# Patient Record
Sex: Female | Born: 1965 | Race: White | Hispanic: No | Marital: Married | Smoking: Former smoker
Health system: Southern US, Community
[De-identification: ages and names within clinical notes are randomized; demographics above are authoritative.]

## PROBLEM LIST (undated history)

## (undated) DIAGNOSIS — H40053 Ocular hypertension, bilateral: Secondary | ICD-10-CM

## (undated) DIAGNOSIS — E041 Nontoxic single thyroid nodule: Secondary | ICD-10-CM

## (undated) DIAGNOSIS — T7840XA Allergy, unspecified, initial encounter: Secondary | ICD-10-CM

## (undated) DIAGNOSIS — Z8709 Personal history of other diseases of the respiratory system: Secondary | ICD-10-CM

## (undated) DIAGNOSIS — M069 Rheumatoid arthritis, unspecified: Secondary | ICD-10-CM

## (undated) DIAGNOSIS — Z8742 Personal history of other diseases of the female genital tract: Secondary | ICD-10-CM

## (undated) DIAGNOSIS — O24419 Gestational diabetes mellitus in pregnancy, unspecified control: Secondary | ICD-10-CM

## (undated) DIAGNOSIS — H409 Unspecified glaucoma: Secondary | ICD-10-CM

## (undated) DIAGNOSIS — Z9889 Other specified postprocedural states: Secondary | ICD-10-CM

## (undated) DIAGNOSIS — I10 Essential (primary) hypertension: Secondary | ICD-10-CM

## (undated) DIAGNOSIS — R519 Headache, unspecified: Secondary | ICD-10-CM

## (undated) DIAGNOSIS — R112 Nausea with vomiting, unspecified: Secondary | ICD-10-CM

## (undated) DIAGNOSIS — G2581 Restless legs syndrome: Secondary | ICD-10-CM

## (undated) DIAGNOSIS — M5030 Other cervical disc degeneration, unspecified cervical region: Secondary | ICD-10-CM

## (undated) DIAGNOSIS — K219 Gastro-esophageal reflux disease without esophagitis: Secondary | ICD-10-CM

## (undated) DIAGNOSIS — R51 Headache: Secondary | ICD-10-CM

## (undated) DIAGNOSIS — G4730 Sleep apnea, unspecified: Secondary | ICD-10-CM

## (undated) HISTORY — DX: Unspecified glaucoma: H40.9

## (undated) HISTORY — DX: Gastro-esophageal reflux disease without esophagitis: K21.9

## (undated) HISTORY — DX: Restless legs syndrome: G25.81

## (undated) HISTORY — DX: Gestational diabetes mellitus in pregnancy, unspecified control: O24.419

## (undated) HISTORY — DX: Personal history of other diseases of the female genital tract: Z87.42

## (undated) HISTORY — DX: Sleep apnea, unspecified: G47.30

## (undated) HISTORY — DX: Rheumatoid arthritis, unspecified: M06.9

## (undated) HISTORY — DX: Allergy, unspecified, initial encounter: T78.40XA

## (undated) HISTORY — PX: TUBAL LIGATION: SHX77

## (undated) HISTORY — PX: PARTIAL HYSTERECTOMY: SHX80

---

## 2005-05-16 ENCOUNTER — Emergency Department: Payer: Self-pay | Admitting: Emergency Medicine

## 2006-08-21 ENCOUNTER — Emergency Department: Payer: Self-pay | Admitting: Emergency Medicine

## 2009-04-09 ENCOUNTER — Emergency Department: Payer: Self-pay | Admitting: Emergency Medicine

## 2015-09-22 ENCOUNTER — Other Ambulatory Visit: Payer: Self-pay | Admitting: Physician Assistant

## 2015-09-22 ENCOUNTER — Telehealth: Payer: Self-pay | Admitting: Physician Assistant

## 2015-09-22 DIAGNOSIS — J309 Allergic rhinitis, unspecified: Secondary | ICD-10-CM

## 2015-09-22 MED ORDER — FLUTICASONE PROPIONATE 50 MCG/ACT NA SUSP: 2.0000 | Freq: Every day | NASAL | Status: DC

## 2015-09-22 NOTE — Telephone Encounter (Signed)
Med refill request

## 2015-10-08 ENCOUNTER — Encounter: Payer: Self-pay | Admitting: Physician Assistant

## 2015-10-08 ENCOUNTER — Ambulatory Visit: Payer: Self-pay | Admitting: Physician Assistant

## 2015-10-08 VITALS — BP 121/70 | HR 82 | Temp 98.5°F

## 2015-10-08 DIAGNOSIS — J069 Acute upper respiratory infection, unspecified: Secondary | ICD-10-CM

## 2015-10-08 MED ORDER — PREDNISONE 10 MG PO TABS
30.0000 mg | ORAL_TABLET | Freq: Every day | ORAL | Status: DC
Start: 2015-10-08 — End: 2016-01-22

## 2015-10-08 MED ORDER — CEFDINIR 300 MG PO CAPS: 300.0000 mg | ORAL_CAPSULE | Freq: Two times a day (BID) | ORAL | Status: DC

## 2015-10-08 MED ORDER — ALBUTEROL SULFATE HFA 108 (90 BASE) MCG/ACT IN AERS: 2.0000 | INHALATION_SPRAY | Freq: Four times a day (QID) | RESPIRATORY_TRACT | Status: DC | PRN

## 2015-10-08 MED ORDER — FLUCONAZOLE 150 MG PO TABS: ORAL_TABLET | ORAL | Status: DC

## 2015-10-08 NOTE — Progress Notes (Signed)
S: C/o runny nose and congestion for 5 days, no fever, chills, cp/sob, v/d; mucus was green this am but clear throughout the day, cough is sporadic, dry, keeping her awake, pressure in face  Using otc meds: robitussin  O: PE: vitals wnl, nad, perrl eomi, normocephalic, tms dull, nasal mucosa red and swollen, throat injected, neck supple no lymph, lungs c t a, cv rrr, neuro intact, cough is dry  A:  Acute  uri   P:omnicef 300mg  bid, albuterol inhaler, prednisone 30mg  qd x 3d, diflucan,  drink fluids, continue regular meds , use otc meds of choice, return if not improving in 5 days, return earlier if worsening

## 2015-11-28 ENCOUNTER — Encounter: Payer: Self-pay | Admitting: Family

## 2015-11-28 ENCOUNTER — Ambulatory Visit: Payer: Self-pay | Admitting: Family

## 2015-11-28 VITALS — BP 119/70 | HR 77 | Temp 98.4°F

## 2015-11-28 DIAGNOSIS — J019 Acute sinusitis, unspecified: Secondary | ICD-10-CM

## 2015-11-28 DIAGNOSIS — Z299 Encounter for prophylactic measures, unspecified: Secondary | ICD-10-CM

## 2015-11-28 MED ORDER — PREDNISONE 10 MG PO TABS: 10.0000 mg | ORAL_TABLET | Freq: Every day | ORAL | Status: DC

## 2015-11-28 MED ORDER — LEVOFLOXACIN 500 MG PO TABS: 500.0000 mg | ORAL_TABLET | Freq: Every day | ORAL | Status: DC

## 2015-11-28 MED ORDER — FLUCONAZOLE 150 MG PO TABS: 150.0000 mg | ORAL_TABLET | Freq: Once | ORAL | Status: DC

## 2015-11-28 NOTE — Progress Notes (Signed)
S/ facial pain and pressure , ears stopped up and dizzy when coughs , fatigue blowing green , not resting at nt due to cough, denies cp or sob O/ VSS  ENT tms dull, nasal mucosa red swollen, left septum  excoriated  , + frontal and max tenderness, throat injected neck supple heart rsr lungs clear A / Sinusitis  P / levaquin 500 mg #10 , diflucan for prn. Prednisone pulse x 3 days , nasal saline products ,

## 2016-01-22 ENCOUNTER — Encounter: Payer: Self-pay | Admitting: Physician Assistant

## 2016-01-22 ENCOUNTER — Ambulatory Visit: Payer: Self-pay | Admitting: Physician Assistant

## 2016-01-22 VITALS — BP 139/80 | HR 76 | Temp 98.5°F

## 2016-01-22 DIAGNOSIS — J069 Acute upper respiratory infection, unspecified: Secondary | ICD-10-CM

## 2016-01-22 LAB — POCT INFLUENZA A/B
Influenza A, POC: NEGATIVE
Influenza B, POC: NEGATIVE

## 2016-01-22 MED ORDER — METHYLPREDNISOLONE 4 MG PO TBPK: ORAL_TABLET | ORAL | Status: DC

## 2016-01-22 MED ORDER — FLUCONAZOLE 150 MG PO TABS
150.0000 mg | ORAL_TABLET | Freq: Once | ORAL | Status: DC
Start: 2016-01-22 — End: 2016-02-11

## 2016-01-22 MED ORDER — AZITHROMYCIN 250 MG PO TABS: ORAL_TABLET | ORAL | Status: DC

## 2016-01-22 NOTE — Progress Notes (Signed)
S: C/o cough and congestion with wheezing and chest pain, chest is sore from coughing, denies fever, chills;  mucus is green, has a lot of head congestion, denies body aches  denies cardiac type chest pain or sob, v/d, abd pain Remainder ros neg  O: vitals wnl, nad, tms clear, throat injected, neck supple no lymph, lungs c t a, cv rrr, neuro intact, cough is dry, flu swab neg  A:  Acute bronchitis   P:  rx medication: zpack, medrol dose pack, diflucan ; use otc meds, tylenol or motrin as needed for fever/chills, return if not better in 3 -5 days, return earlier if worsening

## 2016-02-11 ENCOUNTER — Encounter: Payer: Self-pay | Admitting: Physician Assistant

## 2016-02-11 ENCOUNTER — Ambulatory Visit: Payer: Self-pay | Admitting: Physician Assistant

## 2016-02-11 VITALS — BP 120/80 | HR 80 | Temp 98.2°F

## 2016-02-11 DIAGNOSIS — J069 Acute upper respiratory infection, unspecified: Secondary | ICD-10-CM

## 2016-02-11 LAB — POCT INFLUENZA A/B
Influenza A, POC: NEGATIVE
Influenza B, POC: NEGATIVE

## 2016-02-11 MED ORDER — METHYLPREDNISOLONE 4 MG PO TBPK: ORAL_TABLET | ORAL | Status: AC

## 2016-02-11 MED ORDER — CEFDINIR 300 MG PO CAPS: 300.0000 mg | ORAL_CAPSULE | Freq: Two times a day (BID) | ORAL | Status: AC

## 2016-02-11 NOTE — Progress Notes (Signed)
S: C/o runny nose and congestion for with dry cough 2 days, no fever, chills, cp/sob, v/d; does have body aches, mucus was green this am , finished zpack and felt better but sx have returned   O: PE: vitals wnl, nad,  perrl eomi, normocephalic, tms dull, nasal mucosa red and swollen, throat injected, neck supple no lymph, lungs c t a, cv rrr, neuro intact, flu swab neg  A:  Acute  uri   P: omnicef 300mg  bid x 10d, medrol dose pack,  drink fluids, continue regular meds , use otc meds of choice, return if not improving in 5 days, return earlier if worsening

## 2016-08-20 ENCOUNTER — Other Ambulatory Visit: Payer: Self-pay | Admitting: Physician Assistant

## 2016-08-20 DIAGNOSIS — J309 Allergic rhinitis, unspecified: Secondary | ICD-10-CM

## 2016-08-20 NOTE — Telephone Encounter (Signed)
spreadsheet

## 2016-09-07 ENCOUNTER — Ambulatory Visit: Payer: Self-pay | Admitting: Physician Assistant

## 2016-09-07 ENCOUNTER — Encounter: Payer: Self-pay | Admitting: Physician Assistant

## 2016-09-07 VITALS — BP 125/70 | HR 76 | Temp 98.7°F

## 2016-09-07 DIAGNOSIS — J069 Acute upper respiratory infection, unspecified: Secondary | ICD-10-CM

## 2016-09-07 MED ORDER — FLUCONAZOLE 150 MG PO TABS: ORAL_TABLET | ORAL | 0 refills | Status: DC

## 2016-09-07 MED ORDER — CEFDINIR 300 MG PO CAPS: 300.0000 mg | ORAL_CAPSULE | Freq: Two times a day (BID) | ORAL | 0 refills | Status: DC

## 2016-09-07 MED ORDER — BENZONATATE 200 MG PO CAPS: 200.0000 mg | ORAL_CAPSULE | Freq: Three times a day (TID) | ORAL | 0 refills | Status: DC | PRN

## 2016-09-07 NOTE — Progress Notes (Signed)
S: C/o runny nose and congestion for 3 days, no fever, chills, cp/sob, v/d; mucus was brown throughout the day, cough is sporadic, throat is really sore, some sinus pressure also, no cp/sob/v/d  Using otc meds: robitussin  O: PE: vitals wnl, nad, perrl eomi, normocephalic, tms dull, nasal mucosa red and swollen, throat injected, neck supple no lymph, lungs c t a, cv rrr, neuro intact  A:  Acute uri   P: drink fluids, continue regular meds , use otc meds of choice, return if not improving in 5 days, return earlier if worsening , omnicef, tessalon, diflucan

## 2016-09-13 ENCOUNTER — Ambulatory Visit: Payer: Self-pay | Admitting: Physician Assistant

## 2016-09-13 ENCOUNTER — Encounter: Payer: Self-pay | Admitting: Physician Assistant

## 2016-09-13 VITALS — BP 132/80 | HR 72 | Temp 98.0°F

## 2016-09-13 DIAGNOSIS — B349 Viral infection, unspecified: Secondary | ICD-10-CM

## 2016-09-13 LAB — POCT INFLUENZA A/B
INFLUENZA A, POC: NEGATIVE
Influenza B, POC: NEGATIVE

## 2016-09-13 MED ORDER — METHYLPREDNISOLONE 4 MG PO TBPK: ORAL_TABLET | ORAL | 0 refills | Status: DC

## 2016-09-13 NOTE — Progress Notes (Signed)
S: C/o runny nose and congestion with dry cough for 7 days, no fever, chills, denies cp/sob, v/d;, cough is sporadic, dry, can't get any mucus out, mucus from her nose is clear,  Using otc meds:   O: PE: vitals wnl, nad,  perrl eomi, normocephalic, tms dull, nasal mucosa red and swollen, throat injected, neck supple no lymph, lungs c t a, cv rrr, neuro intact, flu swab neg  A:  Acute flu like illness   P: drink fluids, continue regular meds , use otc meds of choice, return if not improving in 5 days, return earlier if worsening

## 2016-10-27 ENCOUNTER — Encounter: Payer: Self-pay | Admitting: Physician Assistant

## 2016-10-27 ENCOUNTER — Ambulatory Visit: Payer: Self-pay | Admitting: Physician Assistant

## 2016-10-27 VITALS — BP 115/70 | HR 74 | Temp 98.2°F

## 2016-10-27 DIAGNOSIS — J069 Acute upper respiratory infection, unspecified: Secondary | ICD-10-CM

## 2016-10-27 MED ORDER — FLUCONAZOLE 150 MG PO TABS: ORAL_TABLET | ORAL | 0 refills | Status: DC

## 2016-10-27 MED ORDER — CEFDINIR 300 MG PO CAPS: 300.0000 mg | ORAL_CAPSULE | Freq: Two times a day (BID) | ORAL | 0 refills | Status: DC

## 2016-10-27 MED ORDER — METHYLPREDNISOLONE 4 MG PO TBPK: ORAL_TABLET | ORAL | 0 refills | Status: DC

## 2016-10-27 NOTE — Progress Notes (Signed)
S: C/o runny nose and congestion for 3 days, no fever, chills, cp/sob, v/d; mucus was green this am but clear throughout the day, cough is sporadic, sinuses are clogged, mucus is like clay, thick and gray,   Using otc meds:   O: PE: vitals wnl, nad, perrl eomi, normocephalic, tms dull, nasal mucosa red and swollen, throat injected, neck supple no lymph, lungs c t a, cv rrr, neuro intact  A:  Acute  uri   P: drink fluids, continue regular meds , use otc meds of choice, return if not improving in 5 days, return earlier if worsening , omnicef, medrol dose, diflucan

## 2016-12-06 ENCOUNTER — Ambulatory Visit
Admission: RE | Admit: 2016-12-06 | Discharge: 2016-12-06 | Disposition: A | Discharge status: Home / Self Care | Payer: Managed Care, Other (non HMO) | Source: Ambulatory Visit | Attending: Physician Assistant | Admitting: Physician Assistant

## 2016-12-06 ENCOUNTER — Encounter: Payer: Self-pay | Admitting: Physician Assistant

## 2016-12-06 ENCOUNTER — Ambulatory Visit: Payer: Self-pay | Admitting: Physician Assistant

## 2016-12-06 VITALS — BP 160/110 | HR 81 | Temp 98.3°F

## 2016-12-06 DIAGNOSIS — R1011 Right upper quadrant pain: Secondary | ICD-10-CM | POA: Insufficient documentation

## 2016-12-06 MED ORDER — METRONIDAZOLE 500 MG PO TABS: 500.0000 mg | ORAL_TABLET | Freq: Three times a day (TID) | ORAL | 0 refills | Status: DC

## 2016-12-06 MED ORDER — DICYCLOMINE HCL 20 MG PO TABS: 20.0000 mg | ORAL_TABLET | Freq: Three times a day (TID) | ORAL | 2 refills | Status: DC

## 2016-12-06 MED ORDER — CIPROFLOXACIN HCL 500 MG PO TABS: 500.0000 mg | ORAL_TABLET | Freq: Two times a day (BID) | ORAL | 0 refills | Status: DC

## 2016-12-06 NOTE — Progress Notes (Addendum)
S: c/o ruq pain, states she always gets a gas bubble in the ruq when her diverticulitis acts up, severe pain overnight, will get a little relief if she passes gas but pain returns after few seconds, last bm was this am, no fever/chills, no v/d but did have some nausea  O: vitals wnl, nad, lungs c ta , cv rrr, abd soft tender in ruq, rlq wnl, llq wnl, bs increased all 4 quads, n/v intact  A: ruq pain  P: [place] abd, cbc, met c, lipase; bentyl, cipro and flagyll if [place] normal [place] normal

## 2016-12-06 NOTE — Patient Instructions (Addendum)
Diverticulitis °Diverticulitis is when small pockets that have formed in your colon (large intestine) become infected or swollen. °Follow these instructions at home: °· Follow your doctor's instructions. °· Follow a special diet if told by your doctor. °· When you feel better, your doctor may tell you to change your diet. You may be told to eat a lot of fiber. Fruits and vegetables are good sources of fiber. Fiber makes it easier to poop (have bowel movements). °· Take supplements or probiotics as told by your doctor. °· Only take medicines as told by your doctor. °· Keep all follow-up visits with your doctor. °Contact a doctor if: °· Your pain does not get better. °· You have a hard time eating food. °· You are not pooping like normal. °Get help right away if: °· Your pain gets worse. °· Your problems do not get better. °· Your problems suddenly get worse. °· You have a fever. °· You keep throwing up (vomiting). °· You have bloody or black, tarry poop (stool). °This information is not intended to replace advice given to you by your health care provider. Make sure you discuss any questions you have with your health care provider. °Document Released: 05/03/2008 Document Revised: 04/22/2016 Document Reviewed: 10/10/2013 °Elsevier Interactive Patient Education © 2017 [employer] ° °

## 2016-12-07 LAB — CBC WITH DIFFERENTIAL/PLATELET
BASOS ABS: 0 10*3/uL (ref 0.0–0.2)
Basos: 0 %
EOS (ABSOLUTE): 0.1 10*3/uL (ref 0.0–0.4)
Eos: 1 %
HEMOGLOBIN: 14.3 g/dL (ref 11.1–15.9)
Hematocrit: 42.5 % (ref 34.0–46.6)
IMMATURE GRANS (ABS): 0 10*3/uL (ref 0.0–0.1)
Immature Granulocytes: 0 %
LYMPHS ABS: 1.2 10*3/uL (ref 0.7–3.1)
LYMPHS: 19 %
MCH: 29.9 pg (ref 26.6–33.0)
MCHC: 33.6 g/dL (ref 31.5–35.7)
MCV: 89 fL (ref 79–97)
MONOCYTES: 7 %
Monocytes Absolute: 0.4 10*3/uL (ref 0.1–0.9)
Neutrophils Absolute: 4.3 10*3/uL (ref 1.4–7.0)
Neutrophils: 73 %
PLATELETS: 252 10*3/uL (ref 150–379)
RBC: 4.79 x10E6/uL (ref 3.77–5.28)
RDW: 13.7 % (ref 12.3–15.4)
WBC: 6 10*3/uL (ref 3.4–10.8)

## 2016-12-07 LAB — COMPREHENSIVE METABOLIC PANEL
ALBUMIN: 4.2 g/dL (ref 3.5–5.5)
ALK PHOS: 68 IU/L (ref 39–117)
ALT: 14 IU/L (ref 0–32)
AST: 15 IU/L (ref 0–40)
Albumin/Globulin Ratio: 1.6 (ref 1.2–2.2)
BILIRUBIN TOTAL: 0.3 mg/dL (ref 0.0–1.2)
BUN / CREAT RATIO: 22 (ref 9–23)
BUN: 13 mg/dL (ref 6–24)
CHLORIDE: 100 mmol/L (ref 96–106)
CO2: 23 mmol/L (ref 18–29)
CREATININE: 0.58 mg/dL (ref 0.57–1.00)
Calcium: 9.4 mg/dL (ref 8.7–10.2)
GFR calc Af Amer: 126 mL/min/{1.73_m2} (ref >59)
GFR calc non Af Amer: 109 mL/min/{1.73_m2} (ref >59)
GLUCOSE: 90 mg/dL (ref 65–99)
Globulin, Total: 2.6 g/dL (ref 1.5–4.5)
Potassium: 4.7 mmol/L (ref 3.5–5.2)
Sodium: 139 mmol/L (ref 134–144)
Total Protein: 6.8 g/dL (ref 6.0–8.5)

## 2016-12-07 LAB — LIPASE: LIPASE: 23 U/L (ref 14–72)

## 2016-12-23 ENCOUNTER — Encounter: Payer: Self-pay | Admitting: Physician Assistant

## 2016-12-23 ENCOUNTER — Ambulatory Visit: Payer: Self-pay | Admitting: Physician Assistant

## 2016-12-23 VITALS — BP 130/80 | Temp 98.6°F

## 2016-12-23 DIAGNOSIS — Z299 Encounter for prophylactic measures, unspecified: Secondary | ICD-10-CM

## 2016-12-23 DIAGNOSIS — J301 Allergic rhinitis due to pollen: Secondary | ICD-10-CM

## 2016-12-23 MED ORDER — FEXOFENADINE HCL 180 MG PO TABS: 180.0000 mg | ORAL_TABLET | Freq: Every day | ORAL | 12 refills | Status: DC

## 2016-12-23 NOTE — Progress Notes (Signed)
S: c/o runny nose, congestion, watery eyes, some sinus pressure, sx for about a week, denies fever/chills/body aches, cough, cp/sob, or v/d, eye doctor took her off of her flonase due to eye pressure, ? What to do, also never had a mammogram before, ?if we can order it, no changes in her breasts  O: vitals wnl, nad, perrl eomi, conjunctiva wnl, tms dull, nasal mucosa swollen and boggy, throat wnl, neck supple no lymph, lungs c t a, cv rrr  A: acute seasonal allergies, preventive measure with mammogram order  P: saline nasal rinse, allegra, mammogram order,

## 2017-01-12 ENCOUNTER — Ambulatory Visit: Payer: Self-pay | Admitting: Physician Assistant

## 2017-01-12 ENCOUNTER — Encounter: Payer: Self-pay | Admitting: Physician Assistant

## 2017-01-12 VITALS — BP 119/80 | HR 82 | Temp 98.8°F

## 2017-01-12 DIAGNOSIS — J069 Acute upper respiratory infection, unspecified: Secondary | ICD-10-CM

## 2017-01-12 LAB — POCT INFLUENZA A/B
INFLUENZA B, POC: NEGATIVE
Influenza A, POC: NEGATIVE

## 2017-01-12 MED ORDER — BENZONATATE 200 MG PO CAPS: 200.0000 mg | ORAL_CAPSULE | Freq: Three times a day (TID) | ORAL | 0 refills | Status: DC | PRN

## 2017-01-12 MED ORDER — OSELTAMIVIR PHOSPHATE 75 MG PO CAPS: 75.0000 mg | ORAL_CAPSULE | Freq: Two times a day (BID) | ORAL | 0 refills | Status: DC

## 2017-01-12 NOTE — Progress Notes (Signed)
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

## 2017-01-17 NOTE — Progress Notes (Signed)
Patient called and expressed that she has a continuous cough and congestion.  I informed Manuela Schwartz and she authorized me to call in an Albuterol Inhaler with 3 refills in to [facility].  Patient has been informed.

## 2017-02-04 ENCOUNTER — Other Ambulatory Visit: Payer: Self-pay | Admitting: Physician Assistant

## 2017-02-04 ENCOUNTER — Telehealth: Payer: Self-pay | Admitting: Physician Assistant

## 2017-02-04 MED ORDER — FLUCONAZOLE 150 MG PO TABS: ORAL_TABLET | ORAL | 0 refills | Status: DC

## 2017-02-04 NOTE — Telephone Encounter (Signed)
Will send rx to [facility]

## 2017-02-04 NOTE — Progress Notes (Signed)
Diflucan for yeast, pt called and said she was itching from recent medications

## 2017-02-28 ENCOUNTER — Ambulatory Visit: Payer: Self-pay | Admitting: Physician Assistant

## 2017-02-28 ENCOUNTER — Encounter: Payer: Self-pay | Admitting: Physician Assistant

## 2017-02-28 ENCOUNTER — Encounter (INDEPENDENT_AMBULATORY_CARE_PROVIDER_SITE_OTHER): Payer: Self-pay

## 2017-02-28 VITALS — BP 129/80 | HR 66 | Temp 98.2°F | Ht 62.0 in | Wt 201.0 lb

## 2017-02-28 DIAGNOSIS — Z0000 Encounter for general adult medical examination without abnormal findings: Secondary | ICD-10-CM

## 2017-02-28 DIAGNOSIS — Z0189 Encounter for other specified special examinations: Secondary | ICD-10-CM

## 2017-02-28 DIAGNOSIS — Z008 Encounter for other general examination: Secondary | ICD-10-CM

## 2017-02-28 NOTE — Progress Notes (Signed)
S: pt here for wellness physical and biometrics for insurance purposes, states she has an appt with gyn at the end of the week, is having painful intercourse, some moodiness, hot flashes; no pap in over 22 years; will discuss mammogram with them, also eye pressures were elevated but the opth thought it was due to the flonase, pressures were better after stopping the flonase,  no other complaints ros neg. PMH: seasonal allergies; tubal ligation Social: no etoh, nonsmoker, no drugs, married with 2 kids  Fam: mother was adopted, she has hx of paranoid schizophrenia, biploar; father was an alcoholic, died of cirrhosis, paternal grandmother had seizures and diabetes, pgf was healthy , died of old age  O: vitals wnl, nad, ENT wnl, neck supple no lymph, lungs c t a, cv rrr, abd soft tender in rlq, bs normal all 4 quads  A: wellness, biometric physical, perimenopausal  P: labs drawn today, will forward to DR Marcelline Mates, pt to f/u with gyn for exam and mammogram order

## 2017-03-02 ENCOUNTER — Ambulatory Visit: Payer: Self-pay | Admitting: Physician Assistant

## 2017-03-04 ENCOUNTER — Ambulatory Visit (INDEPENDENT_AMBULATORY_CARE_PROVIDER_SITE_OTHER): Payer: Managed Care, Other (non HMO) | Admitting: Obstetrics and Gynecology

## 2017-03-04 ENCOUNTER — Encounter: Payer: Self-pay | Admitting: Obstetrics and Gynecology

## 2017-03-04 VITALS — BP 145/80 | HR 78 | Ht 62.0 in | Wt 197.0 lb

## 2017-03-04 DIAGNOSIS — Z8742 Personal history of other diseases of the female genital tract: Secondary | ICD-10-CM | POA: Diagnosis not present

## 2017-03-04 DIAGNOSIS — R030 Elevated blood-pressure reading, without diagnosis of hypertension: Secondary | ICD-10-CM

## 2017-03-04 DIAGNOSIS — N920 Excessive and frequent menstruation with regular cycle: Secondary | ICD-10-CM | POA: Diagnosis not present

## 2017-03-04 DIAGNOSIS — N393 Stress incontinence (female) (male): Secondary | ICD-10-CM

## 2017-03-04 DIAGNOSIS — Z1231 Encounter for screening mammogram for malignant neoplasm of breast: Secondary | ICD-10-CM | POA: Diagnosis not present

## 2017-03-04 DIAGNOSIS — N951 Menopausal and female climacteric states: Secondary | ICD-10-CM

## 2017-03-04 DIAGNOSIS — Z124 Encounter for screening for malignant neoplasm of cervix: Secondary | ICD-10-CM

## 2017-03-04 DIAGNOSIS — R102 Pelvic and perineal pain: Secondary | ICD-10-CM

## 2017-03-04 DIAGNOSIS — Z0000 Encounter for general adult medical examination without abnormal findings: Secondary | ICD-10-CM

## 2017-03-04 NOTE — Progress Notes (Signed)
GYNECOLOGY ANNUAL PHYSICAL EXAM PROGRESS NOTE  Subjective:    Yvonne [name] is a 51 y.o. G63P2002 female who presents to establish care, and for an annual exam.  Patient notes that she has not seen a GYN in ~ 22 years. The patient is sexually active.  The patient wears seatbelts: yes. The patient participates in regular exercise: no. Has the patient ever been transfused or tattooed?: no. The patient reports that there is not domestic violence in her life.    Gynecologic History Menarche age: 10 Patient's last menstrual period was 02/21/2017. Period Duration (Days): 10 days Period Pattern: (!) Irregular Menstrual Flow: Heavy Dysmenorrhea: (!) Moderate Dysmenorrhea Symptoms: Cramping  Contraception: tubal ligation History of STI's: Denies Last Pap: ~ 22 years ago. Results were: normal.  Denies h/o abnormal pap smears. Last mammogram: patient has never had a mammogram.    Obstetric History   G2   P2   T0   P0   A0   L2    SAB0   TAB0   Ectopic0   Multiple0   Live Births2     # Outcome Date GA Lbr Len/2nd Weight Sex Delivery Anes PTL Lv  2 Para 1995    F Vag-Spont   LIV  1 Para 1992    M Vag-Spont   LIV      Past Medical History:  Diagnosis Date  . Allergy   . Gestational diabetes   . Preeclampsia     Past Surgical History:  Procedure Laterality Date  . TUBAL LIGATION      Family History  Problem Relation Age of Onset  . Schizophrenia Mother   . Bipolar disorder Mother   . Alcohol abuse Father   . Seizures Paternal Grandmother   . Diabetes Paternal Grandmother     Social History   Social History  . Marital status: Married    Spouse name: N/A  . Number of children: N/A  . Years of education: N/A   Occupational History  . Not on file.   Social History Main Topics  . Smoking status: Never Smoker  . Smokeless tobacco: Never Used  . Alcohol use No  . Drug use: No  . Sexual activity: Yes    Birth control/ protection: Surgical   Other Topics Concern   . Not on file   Social History Narrative  . No narrative on file    Current Outpatient Prescriptions on File Prior to Visit  Medication Sig Dispense Refill  . albuterol (PROVENTIL HFA;VENTOLIN HFA) 108 (90 BASE) MCG/ACT inhaler Inhale 2 puffs into the lungs every 6 (six) hours as needed for wheezing or shortness of breath. 1 Inhaler 0   No current facility-administered medications on file prior to visit.     Allergies  Allergen Reactions  . Flonase [Fluticasone Propionate]     Increases eye pressure   . Sulfa Antibiotics   . Tussionex Pennkinetic Er [Hydrocod Polst-Cpm Polst Er]       Review of Systems Constitutional: negative for chills, fatigue, fevers and sweats Eyes: negative for irritation, redness and visual disturbance Ears, nose, mouth, throat, and face: negative for hearing loss, nasal congestion, snoring and tinnitus Respiratory: negative for asthma, cough, sputum Cardiovascular: negative for chest pain, dyspnea, exertional chest pressure/discomfort, irregular heart beat, palpitations and syncope Gastrointestinal: negative for abdominal pain, change in bowel habits, nausea and vomiting Genitourinary: positive for urinary incontinence (with coughing/sneezing), and intermittent pain in lower pelvis, vague, radiating towards back and down legs. Negative for  abnormal menstrual periods, genital lesions, sexual problems and vaginal discharge, dysuria and Integument/breast: negative for breast lump, breast tenderness and nipple discharge Hematologic/lymphatic: negative for bleeding and easy bruising Musculoskeletal:negative for back pain and muscle weakness Neurological: negative for dizziness, headaches, vertigo and weakness Endocrine: Positive for hot flushes and nigh sweats (2-3 months).  Negative for diabetic symptoms including polydipsia, polyuria and skin dryness Allergic/Immunologic: negative for hay fever and urticaria    Psychologic: moodiness (past 6 months).  Negative for depression, anxiety, insomnia.      Objective:  Blood pressure (!) 145/80, pulse 78, height 5\' 2"  (1.575 m), weight 197 lb (89.4 kg), last menstrual period 02/21/2017. Body mass index is 36.03 kg/m.    General Appearance:    Alert, cooperative, no distress, appears stated age  Head:    Normocephalic, without obvious abnormality, atraumatic  Eyes:    PERRL, conjunctiva/corneas clear, EOM's intact, both eyes  Ears:    Normal external ear canals, both ears  Nose:   Nares normal, septum midline, mucosa normal, no drainage or sinus tenderness  Throat:   Lips, mucosa, and tongue normal; teeth and gums normal  Neck:   Supple, symmetrical, trachea midline, no adenopathy; thyroid: no enlargement/tenderness/nodules; no carotid bruit or JVD  Back:     Symmetric, no curvature, ROM normal, no CVA tenderness  Lungs:     Clear to auscultation bilaterally, respirations unlabored  Chest Wall:    No tenderness or deformity   Heart:    Regular rate and rhythm, S1 and S2 normal, no murmur, rub or gallop  Breast Exam:    No tenderness, masses, or nipple abnormality  Abdomen:     Soft, non-tender, bowel sounds active all four quadrants, no masses, no organomegaly.    Genitalia:    Pelvic:external genitalia normal, vagina without lesions, discharge, or tenderness, rectovaginal septum  normal. Cervix normal in appearance, no cervical motion tenderness, no adnexal masses or tenderness.  Uterus normal size, shape, mobile, regular contours, nontender.  Rectal:    Normal external sphincter.  No hemorrhoids appreciated. Internal exam not done.   Extremities:   Extremities normal, atraumatic, no cyanosis or edema  Pulses:   2+ and symmetric all extremities  Skin:   Skin color, texture, turgor normal, no rashes or lesions  Lymph nodes:   Cervical, supraclavicular, and axillary nodes normal  Neurologic:   CNII-XII intact, normal strength, sensation and reflexes throughout     Labs:  Lab Results    Component Value Date   WBC 5.0 02/28/2017   HCT 42.5 02/28/2017   MCV 90 02/28/2017   PLT 252 02/28/2017      Chemistry      Component Value Date/Time   NA 142 02/28/2017 0928   K 4.3 02/28/2017 0928   CL 102 02/28/2017 0928   CO2 23 12/06/2016 1058   BUN 21 02/28/2017 0928   CREATININE 0.50 (L) 02/28/2017 0928      Component Value Date/Time   CALCIUM 9.3 02/28/2017 0928   ALKPHOS 71 02/28/2017 0928   AST 31 02/28/2017 0928   ALT 30 02/28/2017 0928   BILITOT 0.2 02/28/2017 0928       Lab Results  Component Value Date   TSH 0.960 02/28/2017    Lipid Panel     Component Value Date/Time   CHOL 241 (H) 02/28/2017 0928   TRIG 95 02/28/2017 0928   HDL 61 02/28/2017 0928   CHOLHDL 4.0 02/28/2017 0928   LDLCALC 161 (H) 02/28/2017 0623  Assessment/Plan:   Encounter for medical examination to establish care - Plan: Blood tests: up to date. Contraception: tubal ligation.  Discussed healthy lifestyle modifications  Cervical cancer screening - Plan: Pap IG and HPV (high risk) DNA detection,   Screening mammogram, encounter for - Plan: MM Digital Screening. Breast self exam technique reviewed and patient encouraged to perform self-exam monthly.  Stress incontinence of urine - Plan: Encouraged Kegel Exercises  Perimenopausal vasomotor symptoms - Plan: Discussed hormonal, non-hormonal, and herbal  management. Given handouts on options, to discuss further at next visit.   Menorrhagia with regular cycle - Plan: US Transvaginal Non-OB, US Pelvis Complete. Likely perimenopausal bleeding.  Normal TSH.   Elevated blood pressure reading - Plan: No prior history of HTN. Will monitor.   Pelvic pain in female - Plan: US Transvaginal Non-OB, US Pelvis Complete  History of ovarian cyst - Plan: US Transvaginal Non-OB, US Pelvis Complete   Follow up in 2-3 weeks to discuss ultrasound findings, and perform endometrial biopsy.   Rubie Maid, MD [facility]

## 2017-03-04 NOTE — Patient Instructions (Addendum)
Health Maintenance, Female Adopting a healthy lifestyle and getting preventive care can go a long way to promote health and wellness. Talk with your health care provider about what schedule of regular examinations is right for you. This is a good chance for you to check in with your provider about disease prevention and staying healthy. In between checkups, there are plenty of things you can do on your own. Experts have done a lot of research about which lifestyle changes and preventive measures are most likely to keep you healthy. Ask your health care provider for more information. Weight and diet Eat a healthy diet  Be sure to include plenty of vegetables, fruits, low-fat dairy products, and lean protein.  Do not eat a lot of foods high in solid fats, added sugars, or salt.  Get regular exercise. This is one of the most important things you can do for your health.  Most adults should exercise for at least 150 minutes each week. The exercise should increase your heart rate and make you sweat (moderate-intensity exercise).  Most adults should also do strengthening exercises at least twice a week. This is in addition to the moderate-intensity exercise. Maintain a healthy weight  Body mass index (BMI) is a measurement that can be used to identify possible weight problems. It estimates body fat based on height and weight. Your health care provider can help determine your BMI and help you achieve or maintain a healthy weight.  For females 76 years of age and older:  A BMI below 18.5 is considered underweight.  A BMI of 18.5 to 24.9 is normal.  A BMI of 25 to 29.9 is considered overweight.  A BMI of 30 and above is considered obese. Watch levels of cholesterol and blood lipids  You should start having your blood tested for lipids and cholesterol at 49 years of age, then have this test every 5 years.  You may need to have your cholesterol levels checked more often if:  Your lipid or  cholesterol levels are high.  You are older than 49 years of age.  You are at high risk for heart disease. Cancer screening Lung Cancer  Lung cancer screening is recommended for adults 64-42 years old who are at high risk for lung cancer because of a history of smoking.  A yearly low-dose CT scan of the lungs is recommended for people who:  Currently smoke.  Have quit within the past 15 years.  Have at least a 30-pack-year history of smoking. A pack year is smoking an average of one pack of cigarettes a day for 1 year.  Yearly screening should continue until it has been 15 years since you quit.  Yearly screening should stop if you develop a health problem that would prevent you from having lung cancer treatment. Breast Cancer  Practice breast self-awareness. This means understanding how your breasts normally appear and feel.  It also means doing regular breast self-exams. Let your health care provider know about any changes, no matter how small.  If you are in your 20s or 30s, you should have a clinical breast exam (CBE) by a health care provider every 1-3 years as part of a regular health exam.  If you are 34 or older, have a CBE every year. Also consider having a breast X-ray (mammogram) every year.  If you have a family history of breast cancer, talk to your health care provider about genetic screening.  If you are at high risk for breast cancer, talk  to your health care provider about having an MRI and a mammogram every year.  Breast cancer gene (BRCA) assessment is recommended for women who have family members with BRCA-related cancers. BRCA-related cancers include:  Breast.  Ovarian.  Tubal.  Peritoneal cancers.  Results of the assessment will determine the need for genetic counseling and BRCA1 and BRCA2 testing. Cervical Cancer  Your health care provider may recommend that you be screened regularly for cancer of the pelvic organs (ovaries, uterus, and vagina).  This screening involves a pelvic examination, including checking for microscopic changes to the surface of your cervix (Pap test). You may be encouraged to have this screening done every 3 years, beginning at age 24.  For women ages 66-65, health care providers may recommend pelvic exams and Pap testing every 3 years, or they may recommend the Pap and pelvic exam, combined with testing for human papilloma virus (HPV), every 5 years. Some types of HPV increase your risk of cervical cancer. Testing for HPV may also be done on women of any age with unclear Pap test results.  Other health care providers may not recommend any screening for nonpregnant women who are considered low risk for pelvic cancer and who do not have symptoms. Ask your health care provider if a screening pelvic exam is right for you.  If you have had past treatment for cervical cancer or a condition that could lead to cancer, you need Pap tests and screening for cancer for at least 20 years after your treatment. If Pap tests have been discontinued, your risk factors (such as having a new sexual partner) need to be reassessed to determine if screening should resume. Some women have medical problems that increase the chance of getting cervical cancer. In these cases, your health care provider may recommend more frequent screening and Pap tests. Colorectal Cancer  This type of cancer can be detected and often prevented.  Routine colorectal cancer screening usually begins at 49 years of age and continues through 49 years of age.  Your health care provider may recommend screening at an earlier age if you have risk factors for colon cancer.  Your health care provider may also recommend using home test kits to check for hidden blood in the stool.  A small camera at the end of a tube can be used to examine your colon directly (sigmoidoscopy or colonoscopy). This is done to check for the earliest forms of colorectal cancer.  Routine  screening usually begins at age 41.  Direct examination of the colon should be repeated every 5-10 years through 49 years of age. However, you may need to be screened more often if early forms of precancerous polyps or small growths are found. Skin Cancer  Check your skin from head to toe regularly.  Tell your health care provider about any new moles or changes in moles, especially if there is a change in a mole's shape or color.  Also tell your health care provider if you have a mole that is larger than the size of a pencil eraser.  Always use sunscreen. Apply sunscreen liberally and repeatedly throughout the day.  Protect yourself by wearing long sleeves, pants, a wide-brimmed hat, and sunglasses whenever you are outside. Heart disease, diabetes, and high blood pressure  High blood pressure causes heart disease and increases the risk of stroke. High blood pressure is more likely to develop in:  People who have blood pressure in the high end of the normal range (130-139/85-89 mm Hg).  People who are overweight or obese.  People who are African American.  If you are 59-24 years of age, have your blood pressure checked every 3-5 years. If you are 34 years of age or older, have your blood pressure checked every year. You should have your blood pressure measured twice-once when you are at a hospital or clinic, and once when you are not at a hospital or clinic. Record the average of the two measurements. To check your blood pressure when you are not at a hospital or clinic, you can use:  An automated blood pressure machine at a pharmacy.  A home blood pressure monitor.  If you are between 29 years and 60 years old, ask your health care provider if you should take aspirin to prevent strokes.  Have regular diabetes screenings. This involves taking a blood sample to check your fasting blood sugar level.  If you are at a normal weight and have a low risk for diabetes, have this test once  every three years after 49 years of age.  If you are overweight and have a high risk for diabetes, consider being tested at a younger age or more often. Preventing infection Hepatitis B  If you have a higher risk for hepatitis B, you should be screened for this virus. You are considered at high risk for hepatitis B if:  You were born in a country where hepatitis B is common. Ask your health care provider which countries are considered high risk.  Your parents were born in a high-risk country, and you have not been immunized against hepatitis B (hepatitis B vaccine).  You have HIV or AIDS.  You use needles to inject street drugs.  You live with someone who has hepatitis B.  You have had sex with someone who has hepatitis B.  You get hemodialysis treatment.  You take certain medicines for conditions, including cancer, organ transplantation, and autoimmune conditions. Hepatitis C  Blood testing is recommended for:  Everyone born from [birth year] through 1963.  Anyone with known risk factors for hepatitis C. Sexually transmitted infections (STIs)  You should be screened for sexually transmitted infections (STIs) including gonorrhea and chlamydia if:  You are sexually active and are younger than 49 years of age.  You are older than 49 years of age and your health care provider tells you that you are at risk for this type of infection.  Your sexual activity has changed since you were last screened and you are at an increased risk for chlamydia or gonorrhea. Ask your health care provider if you are at risk.  If you do not have HIV, but are at risk, it may be recommended that you take a prescription medicine daily to prevent HIV infection. This is called pre-exposure prophylaxis (PrEP). You are considered at risk if:  You are sexually active and do not regularly use condoms or know the HIV status of your partner(s).  You take drugs by injection.  You are sexually active with a partner  who has HIV. Talk with your health care provider about whether you are at high risk of being infected with HIV. If you choose to begin PrEP, you should first be tested for HIV. You should then be tested every 3 months for as long as you are taking PrEP. Pregnancy  If you are premenopausal and you may become pregnant, ask your health care provider about preconception counseling.  If you may become pregnant, take 400 to 800 micrograms (mcg) of folic acid  every day.  If you want to prevent pregnancy, talk to your health care provider about birth control (contraception). Osteoporosis and menopause  Osteoporosis is a disease in which the bones lose minerals and strength with aging. This can result in serious bone fractures. Your risk for osteoporosis can be identified using a bone density scan.  If you are 26 years of age or older, or if you are at risk for osteoporosis and fractures, ask your health care provider if you should be screened.  Ask your health care provider whether you should take a calcium or vitamin D supplement to lower your risk for osteoporosis.  Menopause may have certain physical symptoms and risks.  Hormone replacement therapy may reduce some of these symptoms and risks. Talk to your health care provider about whether hormone replacement therapy is right for you. Follow these instructions at home:  Schedule regular health, dental, and eye exams.  Stay current with your immunizations.  Do not use any tobacco products including cigarettes, chewing tobacco, or electronic cigarettes.  If you are pregnant, do not drink alcohol.  If you are breastfeeding, limit how much and how often you drink alcohol.  Limit alcohol intake to no more than 1 drink per day for nonpregnant women. One drink equals 12 ounces of beer, 5 ounces of wine, or 1 ounces of hard liquor.  Do not use street drugs.  Do not share needles.  Ask your health care provider for help if you need support  or information about quitting drugs.  Tell your health care provider if you often feel depressed.  Tell your health care provider if you have ever been abused or do not feel safe at home. This information is not intended to replace advice given to you by your health care provider. Make sure you discuss any questions you have with your health care provider. Document Released: 05/31/2011 Document Revised: 04/22/2016 Document Reviewed: 08/19/2015 Elsevier Interactive Patient Education  2017 [place] is the time when your body begins to move into the menopause (no menstrual period for 12 straight months). It is a natural process. Perimenopause can begin 2-8 years before the menopause and usually lasts for 1 year after the menopause. During this time, your ovaries may or may not produce an egg. The ovaries vary in their production of estrogen and progesterone hormones each month. This can cause irregular menstrual periods, difficulty getting pregnant, vaginal bleeding between periods, and uncomfortable symptoms. What are the causes?  Irregular production of the ovarian hormones, estrogen and progesterone, and not ovulating every month. Other causes include:  Tumor of the pituitary gland in the brain.  Medical disease that affects the ovaries.  Radiation treatment.  Chemotherapy.  Unknown causes.  Heavy smoking and excessive alcohol intake can bring on perimenopause sooner. What are the signs or symptoms?  Hot flashes.  Night sweats.  Irregular menstrual periods.  Decreased sex drive.  Vaginal dryness.  Headaches.  Mood swings.  Depression.  Memory problems.  Irritability.  Tiredness.  Weight gain.  Trouble getting pregnant.  The beginning of losing bone cells (osteoporosis).  The beginning of hardening of the arteries (atherosclerosis). How is this diagnosed? Your health care provider will make a diagnosis by analyzing  your age, menstrual history, and symptoms. He or she will do a physical exam and note any changes in your body, especially your female organs. Female hormone tests may or may not be helpful depending on the amount of female hormones  you produce and when you produce them. However, other hormone tests may be helpful to rule out other problems. How is this treated? In some cases, no treatment is needed. The decision on whether treatment is necessary during the perimenopause should be made by you and your health care provider based on how the symptoms are affecting you and your lifestyle. Various treatments are available, such as:  Treating individual symptoms with a specific medicine for that symptom.  Herbal medicines that can help specific symptoms.  Counseling.  Group therapy. Follow these instructions at home:  Keep track of your menstrual periods (when they occur, how heavy they are, how long between periods, and how long they last) as well as your symptoms and when they started.  Only take over-the-counter or prescription medicines as directed by your health care provider.  Sleep and rest.  Exercise.  Eat a diet that contains calcium (good for your bones) and soy (acts like the estrogen hormone).  Do not smoke.  Avoid alcoholic beverages.  Take vitamin supplements as recommended by your health care provider. Taking vitamin E may help in certain cases.  Take calcium and vitamin D supplements to help prevent bone loss.  Group therapy is sometimes helpful.  Acupuncture may help in some cases. Contact a health care provider if:  You have questions about any symptoms you are having.  You need a referral to a specialist (gynecologist, psychiatrist, or psychologist). Get help right away if:  You have vaginal bleeding.  Your period lasts longer than 8 days.  Your periods are recurring sooner than 21 days.  You have bleeding after intercourse.  You have severe  depression.  You have pain when you urinate.  You have severe headaches.  You have vision problems. This information is not intended to replace advice given to you by your health care provider. Make sure you discuss any questions you have with your health care provider. Document Released: 12/23/2004 Document Revised: 04/22/2016 Document Reviewed: 06/14/2013 Elsevier Interactive Patient Education  2017 [place].         Kegel Exercises Kegel exercises help strengthen the muscles that support the rectum, vagina, small intestine, bladder, and uterus. Doing Kegel exercises can help:  Improve bladder and bowel control.  Improve sexual response.  Reduce problems and discomfort during pregnancy. Kegel exercises involve squeezing your pelvic floor muscles, which are the same muscles you squeeze when you try to stop the flow of urine. The exercises can be done while sitting, standing, or lying down, but it is best to vary your position. Phase 1 exercises 1. Squeeze your pelvic floor muscles tight. You should feel a tight lift in your rectal area. If you are a female, you should also feel a tightness in your vaginal area. Keep your stomach, buttocks, and legs relaxed. 2. Hold the muscles tight for up to 10 seconds. 3. Relax your muscles. Repeat this exercise 50 times a day or as many times as told by your health care provider. Continue to do this exercise for at least 4-6 weeks or for as long as told by your health care provider. This information is not intended to replace advice given to you by your health care provider. Make sure you discuss any questions you have with your health care provider. Document Released: 11/01/2012 Document Revised: 07/10/2016 Document Reviewed: 10/05/2015 Elsevier Interactive Patient Education  2017 [employer].

## 2017-03-06 ENCOUNTER — Encounter: Payer: Self-pay | Admitting: Obstetrics and Gynecology

## 2017-03-08 ENCOUNTER — Encounter: Payer: Managed Care, Other (non HMO) | Admitting: Obstetrics and Gynecology

## 2017-03-08 ENCOUNTER — Other Ambulatory Visit: Payer: Managed Care, Other (non HMO)

## 2017-03-09 LAB — PAP IG AND HPV HIGH-RISK
HPV, HIGH-RISK: NEGATIVE
PAP SMEAR COMMENT: 0

## 2017-03-09 LAB — CMP12+LP+TP+TSH+6AC+CBC/D/PLT
A/G RATIO: 1.6 (ref 1.2–2.2)
ALBUMIN: 4.1 g/dL (ref 3.5–5.5)
ALK PHOS: 71 IU/L (ref 39–117)
ALT: 30 IU/L (ref 0–32)
AST: 31 IU/L (ref 0–40)
BASOS: 0 %
BILIRUBIN TOTAL: 0.2 mg/dL (ref 0.0–1.2)
BUN/Creatinine Ratio: 42 — ABNORMAL HIGH (ref 9–23)
BUN: 21 mg/dL (ref 6–24)
Basophils Absolute: 0 10*3/uL (ref 0.0–0.2)
CHOLESTEROL TOTAL: 241 mg/dL — AB (ref 100–199)
Calcium: 9.3 mg/dL (ref 8.7–10.2)
Chloride: 102 mmol/L (ref 96–106)
Chol/HDL Ratio: 4 ratio (ref 0.0–4.4)
Creatinine, Ser: 0.5 mg/dL — ABNORMAL LOW (ref 0.57–1.00)
EOS (ABSOLUTE): 0.1 10*3/uL (ref 0.0–0.4)
Eos: 2 %
Estimated CHD Risk: 0.8 times avg. (ref 0.0–1.0)
FREE THYROXINE INDEX: 1.7 (ref 1.2–4.9)
GFR calc non Af Amer: 115 mL/min/{1.73_m2} (ref >59)
GFR, EST AFRICAN AMERICAN: 132 mL/min/{1.73_m2} (ref >59)
GGT: 31 IU/L (ref 0–60)
GLOBULIN, TOTAL: 2.5 g/dL (ref 1.5–4.5)
Glucose: 93 mg/dL (ref 65–99)
HDL: 61 mg/dL (ref >39)
HEMATOCRIT: 42.5 % (ref 34.0–46.6)
HEMOGLOBIN: 14 g/dL (ref 11.1–15.9)
IMMATURE GRANS (ABS): 0 10*3/uL (ref 0.0–0.1)
IMMATURE GRANULOCYTES: 0 %
Iron: 70 ug/dL (ref 27–159)
LDH: 184 IU/L (ref 119–226)
LDL CALC: 161 mg/dL — AB (ref 0–99)
LYMPHS: 23 %
Lymphocytes Absolute: 1.2 10*3/uL (ref 0.7–3.1)
MCH: 29.5 pg (ref 26.6–33.0)
MCHC: 32.9 g/dL (ref 31.5–35.7)
MCV: 90 fL (ref 79–97)
MONOCYTES: 9 %
MONOS ABS: 0.5 10*3/uL (ref 0.1–0.9)
NEUTROS PCT: 66 %
Neutrophils Absolute: 3.2 10*3/uL (ref 1.4–7.0)
Phosphorus: 3.3 mg/dL (ref 2.5–4.5)
Platelets: 252 10*3/uL (ref 150–379)
Potassium: 4.3 mmol/L (ref 3.5–5.2)
RBC: 4.74 x10E6/uL (ref 3.77–5.28)
RDW: 13.4 % (ref 12.3–15.4)
SODIUM: 142 mmol/L (ref 134–144)
T3 Uptake Ratio: 22 % — ABNORMAL LOW (ref 24–39)
T4, Total: 7.9 ug/dL (ref 4.5–12.0)
TRIGLYCERIDES: 95 mg/dL (ref 0–149)
TSH: 0.96 u[IU]/mL (ref 0.450–4.500)
Total Protein: 6.6 g/dL (ref 6.0–8.5)
Uric Acid: 3.5 mg/dL (ref 2.5–7.1)
VLDL CHOLESTEROL CAL: 19 mg/dL (ref 5–40)
WBC: 5 10*3/uL (ref 3.4–10.8)

## 2017-03-09 LAB — HCV COMMENT:

## 2017-03-09 LAB — VITAMIN D 25 HYDROXY (VIT D DEFICIENCY, FRACTURES): Vit D, 25-Hydroxy: 31 ng/mL (ref 30.0–100.0)

## 2017-03-09 LAB — ESTROGENS, TOTAL: ESTROGEN: 471 pg/mL

## 2017-03-09 LAB — FSH/LH
FSH: 20.1 m[IU]/mL
LH: 23.5 m[IU]/mL

## 2017-03-09 LAB — HIV ANTIBODY (ROUTINE TESTING W REFLEX): HIV SCREEN 4TH GENERATION: NONREACTIVE

## 2017-03-09 LAB — HEPATITIS C ANTIBODY (REFLEX): HCV Ab: <0.1 s/co ratio (ref 0.0–0.9)

## 2017-03-14 ENCOUNTER — Telehealth: Payer: Self-pay | Admitting: Obstetrics and Gynecology

## 2017-03-14 DIAGNOSIS — B3731 Acute candidiasis of vulva and vagina: Secondary | ICD-10-CM

## 2017-03-14 DIAGNOSIS — B373 Candidiasis of vulva and vagina: Secondary | ICD-10-CM

## 2017-03-14 MED ORDER — FLUCONAZOLE 150 MG PO TABS: 150.0000 mg | ORAL_TABLET | Freq: Once | ORAL | 0 refills | Status: AC

## 2017-03-14 NOTE — Telephone Encounter (Signed)
Rx sent. Pt will need to be seen in office if no improvement.

## 2017-03-14 NOTE — Telephone Encounter (Signed)
Patient called requesting a script for a yeast infection so she can pay for it with her flex card. She uses [facility] in [place].

## 2017-03-18 ENCOUNTER — Ambulatory Visit: Payer: Self-pay | Admitting: Family

## 2017-03-18 VITALS — BP 140/80 | HR 70 | Temp 98.0°F

## 2017-03-18 DIAGNOSIS — M62838 Other muscle spasm: Secondary | ICD-10-CM

## 2017-03-18 MED ORDER — METHOCARBAMOL 500 MG PO TABS: 500.0000 mg | ORAL_TABLET | Freq: Four times a day (QID) | ORAL | 0 refills | Status: DC | PRN

## 2017-03-18 NOTE — Progress Notes (Signed)
S/ c/o muscle aches and pains from over use  6 days ago, upper thighs and calves sore , used epson salt soaks, motrin , sxs improved except lower legs.  Denies CP, SOB or leg edema ,heat. Insists muscular.   O/ VSS NAD  Back nontender , full ROM , quads, calves tender to touch , no edema , heat .  A/ Muscle spasms  P.robaxin 500 q 6 h prn #12 . Supportive measures .Asked for work note for today. f/u prn not improving.

## 2017-03-22 ENCOUNTER — Telehealth: Payer: Self-pay | Admitting: Emergency Medicine

## 2017-03-22 MED ORDER — BACLOFEN 10 MG PO TABS: 10.0000 mg | ORAL_TABLET | Freq: Three times a day (TID) | ORAL | 0 refills | Status: DC

## 2017-03-22 MED ORDER — METHYLPREDNISOLONE 4 MG PO TBPK: ORAL_TABLET | ORAL | 0 refills | Status: DC

## 2017-03-22 NOTE — Telephone Encounter (Signed)
Sent rx for medrol dose pack and baclofen to pharmacy, pt is still having back pain, was taking flexeril with a little relief

## 2017-03-22 NOTE — Telephone Encounter (Signed)
Patient called and expressed that she continues to have back pain and just took her last Flexeril.  She want to know if we can refill her meds.  I informed her that the Fllexeril is not refillable and that you suggest Robaxin or Baclofen.  She is fine on taking either.  She uses [facility].

## 2017-03-23 ENCOUNTER — Ambulatory Visit (INDEPENDENT_AMBULATORY_CARE_PROVIDER_SITE_OTHER): Payer: Managed Care, Other (non HMO)

## 2017-03-23 ENCOUNTER — Encounter: Payer: Self-pay | Admitting: Obstetrics and Gynecology

## 2017-03-23 ENCOUNTER — Ambulatory Visit (INDEPENDENT_AMBULATORY_CARE_PROVIDER_SITE_OTHER): Payer: Managed Care, Other (non HMO) | Admitting: Obstetrics and Gynecology

## 2017-03-23 VITALS — BP 170/99 | HR 79 | Ht 62.0 in | Wt 200.0 lb

## 2017-03-23 DIAGNOSIS — R102 Pelvic and perineal pain: Secondary | ICD-10-CM

## 2017-03-23 DIAGNOSIS — Z8742 Personal history of other diseases of the female genital tract: Secondary | ICD-10-CM

## 2017-03-23 DIAGNOSIS — N920 Excessive and frequent menstruation with regular cycle: Secondary | ICD-10-CM

## 2017-03-23 DIAGNOSIS — R4586 Emotional lability: Secondary | ICD-10-CM

## 2017-03-23 DIAGNOSIS — N939 Abnormal uterine and vaginal bleeding, unspecified: Secondary | ICD-10-CM | POA: Diagnosis not present

## 2017-03-23 DIAGNOSIS — F39 Unspecified mood [affective] disorder: Secondary | ICD-10-CM | POA: Diagnosis not present

## 2017-03-23 DIAGNOSIS — R030 Elevated blood-pressure reading, without diagnosis of hypertension: Secondary | ICD-10-CM | POA: Diagnosis not present

## 2017-03-23 MED ORDER — PAROXETINE HCL 10 MG PO TABS: 10.0000 mg | ORAL_TABLET | Freq: Every day | ORAL | 3 refills | Status: DC

## 2017-03-23 NOTE — Progress Notes (Signed)
GYNECOLOGY PROGRESS NOTE  Subjective:    Patient ID: Yvonne [name], female    DOB: 05-Oct-1966, 51 y.o.   MRN: [id number]  HPI  Patient is a 51 y.o. G2P2 female who presents for f/u after ultrasound for pelvic pain (mostly right sided), h/o ovarian cysts, and abnormal uterine bleeding.   The following portions of the patient's history were reviewed and updated as appropriate: allergies, current medications, past family history, past medical history, past social history, past surgical history and problem list.  Review of [facility]Psych: positive for mood swings and crying spells  Objective:   Blood pressure (!) 170/99, pulse 79, height 5\' 2"  (1.575 m), weight 200 lb (90.7 kg), last menstrual period 02/21/2017. General appearance: alert and no distress Abdomen: soft, non-tender; bowel sounds normal; no masses,  no organomegaly Pelvic:external genitalia normal, rectovaginal septum normal.  Vagina without discharge.  Cervix normal appearing, no lesions and no motion tenderness.  Uterus mobile, nontender, normal shape and size.  Adnexae non-palpable, nontender bilaterally.    Labs:  03/04/2017: Pap smear negative   Imaging:  ULTRASOUND REPORT  Location: [facility] Date of Service: 03/23/17   Indications: Menorrhagia, RLQ pain with history of ovarian cysts. Findings:  The uterus is anteverted and measures 8.4 x 4.8 x 4.8 cm. Echo texture is diffusely heterogenous with evidence of a possible focal mass in the anterior fundal portion of the uterus. Within the uterus is a suspected fibroid measuring: Fibroid 1: 2.1 x 1.9 x 1.7 cm. This is located in the anterior fundus and appears hyperechoic. It is not well defined.  The Endometrium measures 6.9 mm.  Right ovary was not identified on either a transvaginal or transabdominal approach. There is a lot of bowel gas in the right lower quadrant area making it difficult to visualize. Left Ovary 2.8 x 2.0 x 2.5  cm. There is a 1.7 cm dominant follicle, otherwise, appears WNL. Survey of the adnexa demonstrates no adnexal masses. There is no free fluid in the cul de sac.  Impression: 1. Probable fibroid uterus, although the borders of the fibroid are not well defined. The fundus, especially the anterior portion, is diffusely heterogenous. Not able to determine if the area/fibroid is subserosal versus submucosal. 2. Right ovary was not seen on transvaginal ultrasound. Transabdominal approach was used and again, due to large amount of bowel gas, the right ovary was not observed.  Assessment:   Pelvic pain Abnormal uterine bleeding Mood changes H/o ovarian cyst Elevated BP    Plan:   - Ultrasound unable to determine if cyst present due to non-visualization.  Small fundal fibroids observed, which may contribute to bleeding if submucosal.  Discussed findings with patient.  Advised to continue current OTC pain management.  If ovarian cyst is present, is likely small.  - Abnormal uterine bleeding, discussed need for endometrial biopsy prior to initiation of therapy. See biopsy procedure note below.  Discussed management options for abnormal uterine bleeding including tranexamic acid (Lysteda), oral progesterone (Megace), Depo Provera, Mirena IUD, endometrial ablation (Novasure/Hydrothermal Ablation) or hysterectomy as definitive surgical management.  Discussed risks and benefits of each method.   Patient unsure of desires, but is considering Mirena IUD.  Handout given.  Patient to return in ~ 2-3 weeks when next menses occurs if IUD is desired. - Discussed mood changes likely due to perimenopausal status.  Discussed management options such as SSRI therapy. Will start on Paxil 10 mg, and can increase as needed.  This will also aid  with any hot flushes or night sweats that may develop once patient enters menopause. Will f/u symptoms at next visit.  - Elevated BP with no prior h/o HTN.  Will monitor at next  visit to assess for presence of HTN.    Endometrial Biopsy Procedure Note  The patient is positioned on the exam table in the dorsal lithotomy position. Bimanual exam confirms uterine position and size. A Graves speculum is placed into the vagina. A single toothed tenaculum is placed onto the anterior lip of the cervix. The pipette is placed into the endocervical canal and is advanced to the uterine fundus. Using a piston like technique, with vacuum created by withdrawing the stylus, the endometrial specimen is obtained and transferred to the biopsy container. Minimal bleeding is encountered. The procedure is well tolerated.   Uterine Position: mid   Uterine Length:  8 cm   Uterine Specimen: Average   Post procedure instructions are given. The patient is scheduled for follow up appointment.   A total of 25 minutes were spent face-to-face with the patient during this encounter and over half of that time dealt with counseling and coordination of care.   Rubie Maid, MD [facility]

## 2017-03-24 ENCOUNTER — Encounter: Payer: Self-pay | Admitting: Obstetrics and Gynecology

## 2017-03-24 ENCOUNTER — Ambulatory Visit: Payer: Self-pay | Admitting: Physician Assistant

## 2017-03-24 VITALS — BP 125/70 | HR 80 | Temp 97.8°F

## 2017-03-24 DIAGNOSIS — M5441 Lumbago with sciatica, right side: Secondary | ICD-10-CM

## 2017-03-24 DIAGNOSIS — Z299 Encounter for prophylactic measures, unspecified: Secondary | ICD-10-CM

## 2017-03-24 NOTE — Patient Instructions (Addendum)

## 2017-03-24 NOTE — Addendum Note (Signed)
Addended by: Rudene Anda T on: 03/24/2017 01:28 PM   Modules accepted: Orders

## 2017-03-24 NOTE — Progress Notes (Signed)
S:  C/o low back pain for 1 week, + known injury, was sitting in a camping chair for too long,  pain is worse with movement, increased with bending over, denies numbness, tingling, or changes in bowel/urinary habits, states she was working earlier this week and had to squat and stack books which aggravated it more so she just went home, was on flexeril with little relief, is now using the steroid and baclofen we called in which seems to be helping  Using otc meds without relief Remainder ros neg  O:  Vitals wnl, nad, lungs c t a, cv rrr, spine nontender, muscles in lower back spasmed , decreased rom with bending forward,  Neg slr, pt walks without difficulty, no foot drop noted, n/v intact  A: acute back pain, muscle spasms  P: use wet heat followed by ice, stretches, return to clinic if not better in 3 t 5 days, return earlier if worsening, rx meds: continue medrol dose pack and baclofen,

## 2017-03-25 LAB — PATHOLOGY

## 2017-03-29 ENCOUNTER — Other Ambulatory Visit: Payer: Self-pay | Admitting: Physician Assistant

## 2017-03-30 ENCOUNTER — Telehealth: Payer: Self-pay

## 2017-03-30 NOTE — Telephone Encounter (Signed)
-----   Message from Rubie Maid, MD sent at 03/28/2017  1:55 PM EDT ----- Please inform patient that her biopsy returned with an endometrial polyp.  Although these are benign, we still recommend surgical removal.  I don't recall if this patient has an upcoming appt (I think she does as she was considering IUD insertion).  I cannot place the IUD in office until after the polyp is removed (although I could do both procedures at the same time in the OR). We can discuss this further at her next visit.

## 2017-03-30 NOTE — Telephone Encounter (Signed)
Called pt informed her of results below, pt gave verbal understanding. Pt transferred to reception to be scheduled.

## 2017-03-31 ENCOUNTER — Other Ambulatory Visit: Payer: Self-pay | Admitting: Physician Assistant

## 2017-04-01 ENCOUNTER — Telehealth: Payer: Self-pay | Admitting: Obstetrics and Gynecology

## 2017-04-01 NOTE — Telephone Encounter (Signed)
Patient called stating she is still in pain. She has been taking ibuprofen and midol but it only helps for an hour before pain returns. Please Advise

## 2017-04-01 NOTE — Telephone Encounter (Signed)
Pt calls and states that she is still having lower abdominal pain and cramping, is taking 600mg  of Motrin with little relief. Advised pt on increasing dose to 800mg  every 6-8hrs and using extra strength tylenol in between as directed. Advised pt on the use of heating pad, and warm bath with epsom salt. Pt gave verbal understanding. Will provide note for pt to be absent from work for the weekend until she can be seen on Tuesday, pt states that she will try to make it to work, but would like note as back up.

## 2017-04-05 ENCOUNTER — Encounter: Payer: Self-pay | Admitting: Obstetrics and Gynecology

## 2017-04-05 ENCOUNTER — Ambulatory Visit (INDEPENDENT_AMBULATORY_CARE_PROVIDER_SITE_OTHER): Payer: Managed Care, Other (non HMO) | Admitting: Obstetrics and Gynecology

## 2017-04-05 VITALS — BP 148/105 | HR 76 | Ht 62.0 in | Wt 200.9 lb

## 2017-04-05 DIAGNOSIS — I10 Essential (primary) hypertension: Secondary | ICD-10-CM

## 2017-04-05 DIAGNOSIS — N939 Abnormal uterine and vaginal bleeding, unspecified: Secondary | ICD-10-CM

## 2017-04-05 DIAGNOSIS — R102 Pelvic and perineal pain: Secondary | ICD-10-CM

## 2017-04-05 DIAGNOSIS — N840 Polyp of corpus uteri: Secondary | ICD-10-CM

## 2017-04-05 MED ORDER — TRAMADOL HCL 50 MG PO TABS: 50.0000 mg | ORAL_TABLET | Freq: Four times a day (QID) | ORAL | 0 refills | Status: DC | PRN

## 2017-04-05 NOTE — H&P (Signed)
GYNECOLOGY PRE-OPERATIVE HISTORY AND PHYSICAL  Subjective:    Patient is a 51 y.o. [id number] female scheduled for Hysteroscopy D&C with polypectomy, IUD insertion, and diagnostic laparoscopy. Indications for procedure are abnormal uterine bleeding (perimenopausal), endometrial polyp, and pelvic pain.  Patient also with a h/o ovarian cysts.    Pertinent Gynecological History: Menses: flow is moderate and regular every 30 days without intermenstrual spotting, cycles last for ~ 10 days.  Bleeding: perimenopausal abnormal uterine bleeding Contraception: tubal ligation Last mammogram: patient has never had a mammogram  Last pap smear: 03/04/2017, normal.    Discussed Blood/Blood Products: no   Menstrual History: OB History    Gravida Para Term Preterm AB Living   2 2       2    SAB TAB Ectopic Multiple Live Births           2      Menarche age: 37  Last menstrual period was 03/25/2017.   Past Medical History:  Diagnosis Date  . Allergy   . Gestational diabetes   . History of ovarian cyst   . Preeclampsia     Past Surgical History:  Procedure Laterality Date  . TUBAL LIGATION      OB History  Gravida Para Term Preterm AB Living  2 2       2   SAB TAB Ectopic Multiple Live Births          2    # Outcome Date GA Lbr Len/2nd Weight Sex Delivery Anes PTL Lv  2 Para 1995    F Vag-Spont   LIV  1 Para 1992    M Vag-Spont   LIV      Social History   Social History  . Marital status: Married    Spouse name: N/A  . Number of children: N/A  . Years of education: N/A   Social History Main Topics  . Smoking status: Never Smoker  . Smokeless tobacco: Never Used  . Alcohol use No  . Drug use: No  . Sexual activity: Yes    Birth control/ protection: Surgical   Other Topics Concern  . None   Social History Narrative  . None    Family History  Problem Relation Age of Onset  . Schizophrenia Mother   . Bipolar disorder Mother   . Alcohol abuse Father   . Seizures  Paternal Grandmother   . Diabetes Paternal Grandmother     Current Outpatient Prescriptions on File Prior to Visit  Medication Sig Dispense Refill  . albuterol (PROVENTIL HFA;VENTOLIN HFA) 108 (90 BASE) MCG/ACT inhaler Inhale 2 puffs into the lungs every 6 (six) hours as needed for wheezing or shortness of breath. 1 Inhaler 0  . baclofen (LIORESAL) 10 MG tablet Take 1 tablet (10 mg total) by mouth 3 (three) times daily. 30 each 0  . methocarbamol (ROBAXIN) 500 MG tablet Take 1 tablet (500 mg total) by mouth every 6 (six) hours as needed for muscle spasms. 12 tablet 0  . PARoxetine (PAXIL) 10 MG tablet Take 1 tablet (10 mg total) by mouth daily. 30 tablet 3   No current facility-administered medications on file prior to visit.      Allergies  Allergen Reactions  . Flonase [Fluticasone Propionate]     Increases eye pressure   . Sulfa Antibiotics   . Tussionex Pennkinetic Er [Hydrocod Polst-Cpm Polst Er]     Review of Systems Constitutional: No recent fever/chills/sweats Respiratory: No recent cough/bronchitis Cardiovascular: No chest pain Gastrointestinal:  No recent nausea/vomiting/diarrhea Genitourinary: No UTI symptoms Hematologic/lymphatic:No history of coagulopathy or recent blood thinner use    Objective:    BP (!) 148/105 (BP Location: Left Arm, Patient Position: Sitting, Cuff Size: Large)   Pulse 76   Ht 5\' 2"  (1.575 m)   Wt 200 lb 14.4 oz (91.1 kg)   BMI 36.75 kg/m   General:   Normal  Skin:   normal  HEENT:  Normal  Neck:  Supple without Adenopathy or Thyromegaly  Lungs:   Heart:              Breasts:   Abdomen:  Pelvis:  M/S   Extremeties:  Neuro:    clear to auscultation bilaterally   Normal without murmur   Not Examined   soft, non-tender; bowel sounds normal; no masses,  no organomegaly   Exam deferred to OR  No CVAT  Warm/Dry   Normal           Labs:  Lab Results  Component Value Date   WBC 5.0 02/28/2017   HCT 42.5 02/28/2017   MCV  90 02/28/2017   PLT 252 02/28/2017    Lab Results  Component Value Date   TSH 0.960 02/28/2017     Pathology:  ENDOMETRIUM, BIOPSY:  WEAKLY PROLIFERATIVE ENDOMETRIUM. NO HYPERPLASIA OR CARCINOMA.  BENIGN ENDOMETRIAL POLYP.    Imaging:  ULTRASOUND REPORT  Location: [facility] Date of Service: 03/23/17   Indications: Menorrhagia, RLQ pain with history of ovarian cysts. Findings:  The uterus is anteverted and measures 8.4 x 4.8 x 4.8 cm. Echo texture is diffusely heterogenous with evidence of a possible focal mass in the anterior fundal portion of the uterus. Within the uterus is a suspected fibroid measuring: Fibroid 1: 2.1 x 1.9 x 1.7 cm. This is located in the anterior fundus and appears hyperechoic. It is not well defined.  The Endometrium measures 6.9 mm.  Right ovary was not identified on either a transvaginal or transabdominal approach. There is a lot of bowel gas in the right lower quadrant area making it difficult to visualize. Left Ovary 2.8 x 2.0 x 2.5 cm. There is a 1.7 cm dominant follicle, otherwise, appears WNL. Survey of the adnexa demonstrates no adnexal masses. There is no free fluid in the cul de sac.  Impression: 1. Probable fibroid uterus, although the borders of the fibroid are not well defined. The fundus, especially the anterior portion, is diffusely heterogenous. Not able to determine if the area/fibroid is subserosal versus submucosal. 2. Right ovary was not seen on transvaginal ultrasound. Transabdominal approach was used and again, due to large amount of bowel gas, the right ovary was not observed.  Recommendations: 1.Clinical correlation with the patient's History and Physical Exam.   Macarthur Critchley, RDMS, RVT   Assessment:    Perimenopausal abnormal uterine bleeding Pelvic pain Endometrial polyp H/o ovarian cysts      Plan:   Counseling: Procedure, risks, reasons, benefits and complications (including injury to  bowel, bladder, major blood vessel, ureter, bleeding, possibility of transfusion, infection, or fistula formation) reviewed in detail. Consent signed. Preop testing ordered. Instructions reviewed, including NPO after midnight. Surgery scheduled for 04/11/2017      Rubie Maid, MD [facility]

## 2017-04-05 NOTE — Progress Notes (Signed)
GYNECOLOGY PROGRESS NOTE  Subjective:    Patient ID: Yvonne [name], female    DOB: 07/15/1966, 51 y.o.   MRN: [id number]  HPI  Patient is a 51 y.o. G2P2 female who presents for discussion of management of newly diagnosed endometrial polyp on endometrial biopsy performed for abnormal uterine bleeding.  Her husband is present with her today.   Of note, patient notes that she has been having increasing right sided pelvic pain over the past several days.  Has been taking Ibuprofen (800 mg) and rotating with Tylenol.  Patient notes that it takes the edge off, but after a few hours she begins to experience pain again. Denies nausea/vomiting, fevers/chills, bowel changes, or urinary symptoms.  The following portions of the patient's history were reviewed and updated as appropriate: allergies, current medications, past family history, past medical history, past social history, past surgical history and problem list.  Review of Systems Pertinent items noted in HPI and remainder of comprehensive ROS otherwise negative.   Objective:   Blood pressure (!) 148/105, pulse 76, height 5\' 2"  (1.575 m), weight 200 lb 14.4 oz (91.1 kg). General appearance: alert and no distress Abdomen: normal findings: bowel sounds normal, no masses palpable and soft and abnormal findings:  mild tenderness in the RLQ Pelvic: deferred   Pathology (03/23/17):  Diagnosis:  ENDOMETRIUM, BIOPSY:  WEAKLY PROLIFERATIVE ENDOMETRIUM. NO HYPERPLASIA OR CARCINOMA.  BENIGN ENDOMETRIAL POLYP  Assessment:   Endometrial polyp Abnormal uterine bleeding Pelvic pain Hypertension  Plan:   - Discussion had on management of endometrial polyp with recommendations for Hysteroscopic polypectomy and D&C.  Discussed nature of the procedure, as well as risks and benefits of procedure.  Patient notes understanding and is ok to proceed with procedure . Handout given on procedure.  - Abnormal uterine bleeding was initially going to be  managed with a Mirena IUD, however with findings of endometrial polyp recommendations were to hold on IUD placement until polyp is removed.  As patient is having a surgical polypectomy, can place IUD after polyp removal in the OR.  - Pelvic pain in female.  Pain has been ongoing for at least the past month, with progressing symptoms over the past few days. Last ultrasound was unable to visualize right adnexa.  Discussed possibility of ordering an ultrasound, vs undergoing a diagnostic laparoscopy at time of other procedure.  She would like to have a laparoscopy performed as she also is questioning whether or not she has endometriosis. Discussed nature of surgery, including risks and benefits.  Handout given on procedure.  Will also prescribe Tramadol as patient with pain not controlled with alternating Tylenol and Ibuprofen.  - Chronic HTN, poorly controlled today.  Patient notes that she was having moderate right-sided pain, which may be causing her blood pressure to be elevated.  Will need to f/u with PCP if elevations consider.   Surgery scheduled for 04/11/2017. She was told that she will be contacted by our surgical scheduler regarding the time and date of her surgery; routine preoperative instructions of having nothing to eat or drink after midnight on the day prior to surgery and also coming to the hospital 1.5 hours prior to her time of surgery were also emphasized. Printed patient education handouts about the procedure were given to the patient to review at home.  Pre-op done today.     A total of 25 minutes were spent face-to-face with the patient during this encounter and over half of that time involved counseling and coordination  of care.   Rubie Maid, MD [facility]

## 2017-04-08 ENCOUNTER — Encounter
Admission: RE | Admit: 2017-04-08 | Discharge: 2017-04-08 | Disposition: A | Discharge status: Home / Self Care | Payer: Managed Care, Other (non HMO) | Source: Ambulatory Visit | Attending: Obstetrics and Gynecology | Admitting: Obstetrics and Gynecology

## 2017-04-08 DIAGNOSIS — N83201 Unspecified ovarian cyst, right side: Secondary | ICD-10-CM | POA: Diagnosis not present

## 2017-04-08 DIAGNOSIS — Z9851 Tubal ligation status: Secondary | ICD-10-CM | POA: Diagnosis not present

## 2017-04-08 DIAGNOSIS — E119 Type 2 diabetes mellitus without complications: Secondary | ICD-10-CM | POA: Diagnosis not present

## 2017-04-08 DIAGNOSIS — I10 Essential (primary) hypertension: Secondary | ICD-10-CM | POA: Diagnosis not present

## 2017-04-08 DIAGNOSIS — K654 Sclerosing mesenteritis: Secondary | ICD-10-CM | POA: Diagnosis not present

## 2017-04-08 DIAGNOSIS — N840 Polyp of corpus uteri: Secondary | ICD-10-CM | POA: Diagnosis not present

## 2017-04-08 DIAGNOSIS — Z79899 Other long term (current) drug therapy: Secondary | ICD-10-CM | POA: Diagnosis not present

## 2017-04-08 DIAGNOSIS — N736 Female pelvic peritoneal adhesions (postinfective): Secondary | ICD-10-CM | POA: Diagnosis not present

## 2017-04-08 HISTORY — DX: Personal history of other diseases of the respiratory system: Z87.09

## 2017-04-08 HISTORY — DX: Ocular hypertension, bilateral: H40.053

## 2017-04-08 HISTORY — DX: Headache: R51

## 2017-04-08 HISTORY — DX: Essential (primary) hypertension: I10

## 2017-04-08 HISTORY — DX: Headache, unspecified: R51.9

## 2017-04-08 LAB — CBC
HEMATOCRIT: 40 % (ref 35.0–47.0)
HEMOGLOBIN: 13.8 g/dL (ref 12.0–16.0)
MCH: 30.5 pg (ref 26.0–34.0)
MCHC: 34.5 g/dL (ref 32.0–36.0)
MCV: 88.3 fL (ref 80.0–100.0)
Platelets: 235 10*3/uL (ref 150–440)
RBC: 4.53 MIL/uL (ref 3.80–5.20)
RDW: 13 % (ref 11.5–14.5)
WBC: 5.6 10*3/uL (ref 3.6–11.0)

## 2017-04-08 NOTE — Patient Instructions (Signed)
  Your procedure is scheduled on: Apr 11, 2017 (Monday) Report to Same Day Surgery 2nd floor medical mall [facility] Entrance-take elevator on left to 2nd floor.  Check in with surgery information desk.) ARRIVAL TIME 7:30 am  Remember: Instructions that are not followed completely may result in serious medical risk, up to and including death, or upon the discretion of your surgeon and anesthesiologist your surgery may need to be rescheduled.    _x___ 1. Do not eat food or drink liquids after midnight. No gum chewing or hard candies                                 __x__ 2. No Alcohol for 24 hours before or after surgery.   __x__3. No Smoking for 24 prior to surgery.   ____  4. Bring all medications with you on the day of surgery if instructed.    __x__ 5. Notify your doctor if there is any change in your medical condition     (cold, fever, infections).     Do not wear jewelry, make-up, hairpins, clips or nail polish.  Do not wear lotions, powders, or perfumes. You may wear deodorant.  Do not shave 48 hours prior to surgery. Men may shave face and neck.  Do not bring valuables to the hospital.    [facility] is not responsible for any belongings or valuables.               Contacts, dentures or bridgework may not be worn into surgery.  Leave your suitcase in the car. After surgery it may be brought to your room.  For patients admitted to the hospital, discharge time is determined by your treatment team                      Patients discharged the day of surgery will not be allowed to drive home.  You will need someone to drive you home and stay with you the night of your procedure.    Please read over the following fact sheets that you were given:   [facility] Preparing for Surgery and or MRSA Information   _x___ Take the following medication with a sip of water the morning of surgery:  1. PAROXETINE  2.  3.  4.  5.  6.  ____Fleets enema or Magnesium Citrate as  directed.   __ Use CHG Soap or sage wipes as directed on instruction sheet   _x___ Use inhalers on the day of surgery and bring to hospital day of surgery  (USE ALBUTEROL [place])  ____ Stop Metformin and Janumet 2 days prior to surgery.    ____ Take 1/2 of usual insulin dose the night before surgery and none on the morning  surgery  _x___ Follow recommendations from Cardiologist, Pulmonologist or PCP regarding          stopping Aspirin, Coumadin, Pllavix ,Eliquis, Effient, or Pradaxa, and Pletal. (STOP IBUPROFEN AND EXCEDRIN MIGRAINE NOW )  X____Stop Anti-inflammatories such as Advil, Aleve, Ibuprofen, Motrin, Naproxen, Naprosyn, Goodies powders or aspirin products. OK to take Tylenol    _x___ Stop supplements until after surgery.  But may continue Vitamin D, Vitamin B,  and multivitamin .      ____ Bring C-Pap to the hospital.

## 2017-04-11 ENCOUNTER — Encounter
Admission: RE | Disposition: A | Discharge status: Home / Self Care | Payer: Self-pay | Source: Ambulatory Visit | Attending: Obstetrics and Gynecology

## 2017-04-11 ENCOUNTER — Encounter: Payer: Self-pay | Admitting: Anesthesiology

## 2017-04-11 ENCOUNTER — Ambulatory Visit: Payer: Managed Care, Other (non HMO) | Admitting: Anesthesiology

## 2017-04-11 ENCOUNTER — Ambulatory Visit
Admission: RE | Admit: 2017-04-11 | Discharge: 2017-04-11 | Disposition: A | Discharge status: Home / Self Care | Payer: Managed Care, Other (non HMO) | Source: Ambulatory Visit | Attending: Obstetrics and Gynecology | Admitting: Obstetrics and Gynecology

## 2017-04-11 DIAGNOSIS — K654 Sclerosing mesenteritis: Secondary | ICD-10-CM | POA: Insufficient documentation

## 2017-04-11 DIAGNOSIS — Z9851 Tubal ligation status: Secondary | ICD-10-CM | POA: Insufficient documentation

## 2017-04-11 DIAGNOSIS — N736 Female pelvic peritoneal adhesions (postinfective): Secondary | ICD-10-CM | POA: Diagnosis not present

## 2017-04-11 DIAGNOSIS — N83201 Unspecified ovarian cyst, right side: Secondary | ICD-10-CM | POA: Insufficient documentation

## 2017-04-11 DIAGNOSIS — N840 Polyp of corpus uteri: Secondary | ICD-10-CM | POA: Insufficient documentation

## 2017-04-11 DIAGNOSIS — Z79899 Other long term (current) drug therapy: Secondary | ICD-10-CM | POA: Insufficient documentation

## 2017-04-11 DIAGNOSIS — I10 Essential (primary) hypertension: Secondary | ICD-10-CM | POA: Insufficient documentation

## 2017-04-11 DIAGNOSIS — E119 Type 2 diabetes mellitus without complications: Secondary | ICD-10-CM | POA: Insufficient documentation

## 2017-04-11 HISTORY — PX: CERVICAL POLYPECTOMY: SHX88

## 2017-04-11 HISTORY — PX: LAPAROSCOPY: SHX197

## 2017-04-11 HISTORY — PX: HYSTEROSCOPY WITH D & C: SHX1775

## 2017-04-11 LAB — POCT PREGNANCY, URINE: PREG TEST UR: NEGATIVE

## 2017-04-11 SURGERY — LAPAROSCOPY, DIAGNOSTIC
Anesthesia: General | Wound class: Clean Contaminated

## 2017-04-11 MED ORDER — ROCURONIUM BROMIDE 50 MG/5ML IV SOLN
INTRAVENOUS | Status: AC
  Filled 2017-04-11: qty 1

## 2017-04-11 MED ORDER — SUGAMMADEX SODIUM 200 MG/2ML IV SOLN
INTRAVENOUS | Status: AC
Start: 2017-04-11 — End: 2017-04-11
  Filled 2017-04-11: qty 2

## 2017-04-11 MED ORDER — PROPOFOL 10 MG/ML IV BOLUS
INTRAVENOUS | Status: DC | PRN
  Administered 2017-04-11: 140 mg via INTRAVENOUS

## 2017-04-11 MED ORDER — FAMOTIDINE 20 MG PO TABS
ORAL_TABLET | ORAL | Status: AC
  Administered 2017-04-11: 20 mg via ORAL
  Filled 2017-04-11: qty 1

## 2017-04-11 MED ORDER — LACTATED RINGERS IV SOLN
INTRAVENOUS | Status: DC | PRN
  Administered 2017-04-11: 13:00:00 via INTRAVENOUS

## 2017-04-11 MED ORDER — OXYCODONE-ACETAMINOPHEN 5-325 MG PO TABS
ORAL_TABLET | ORAL | Status: AC
  Filled 2017-04-11: qty 1

## 2017-04-11 MED ORDER — DOCUSATE SODIUM 100 MG PO CAPS: 100.0000 mg | ORAL_CAPSULE | Freq: Two times a day (BID) | ORAL | 2 refills | Status: DC | PRN

## 2017-04-11 MED ORDER — FAMOTIDINE 20 MG PO TABS
20.0000 mg | ORAL_TABLET | Freq: Once | ORAL | Status: AC
  Administered 2017-04-11: 20 mg via ORAL

## 2017-04-11 MED ORDER — MIDAZOLAM HCL 2 MG/2ML IJ SOLN
INTRAMUSCULAR | Status: DC | PRN
Start: 2017-04-11 — End: 2017-04-11
  Administered 2017-04-11: 2 mg via INTRAVENOUS

## 2017-04-11 MED ORDER — FENTANYL CITRATE (PF) 100 MCG/2ML IJ SOLN
INTRAMUSCULAR | Status: AC
  Filled 2017-04-11: qty 2

## 2017-04-11 MED ORDER — LACTATED RINGERS IV SOLN
INTRAVENOUS | Status: DC
  Administered 2017-04-11: 09:00:00 via INTRAVENOUS

## 2017-04-11 MED ORDER — OXYCODONE-ACETAMINOPHEN 5-325 MG PO TABS
1.0000 | ORAL_TABLET | Freq: Four times a day (QID) | ORAL | Status: DC | PRN
  Administered 2017-04-11: 1 via ORAL

## 2017-04-11 MED ORDER — LIDOCAINE HCL 2 % IJ SOLN
INTRAMUSCULAR | Status: AC
  Filled 2017-04-11: qty 10

## 2017-04-11 MED ORDER — ONDANSETRON HCL 4 MG/2ML IJ SOLN
INTRAMUSCULAR | Status: AC
  Filled 2017-04-11: qty 2

## 2017-04-11 MED ORDER — BUPIVACAINE HCL 0.5 % IJ SOLN
INTRAMUSCULAR | Status: DC | PRN
  Administered 2017-04-11: 8 mL

## 2017-04-11 MED ORDER — SEVOFLURANE IN SOLN
RESPIRATORY_TRACT | Status: AC
  Filled 2017-04-11: qty 250

## 2017-04-11 MED ORDER — PROPOFOL 10 MG/ML IV BOLUS
INTRAVENOUS | Status: AC
  Filled 2017-04-11: qty 20

## 2017-04-11 MED ORDER — ONDANSETRON HCL 4 MG/2ML IJ SOLN
INTRAMUSCULAR | Status: DC | PRN
  Administered 2017-04-11: 4 mg via INTRAVENOUS

## 2017-04-11 MED ORDER — BUPIVACAINE HCL (PF) 0.5 % IJ SOLN
INTRAMUSCULAR | Status: AC
  Filled 2017-04-11: qty 30

## 2017-04-11 MED ORDER — ACETAMINOPHEN 10 MG/ML IV SOLN
INTRAVENOUS | Status: DC | PRN
  Administered 2017-04-11: 1000 mg via INTRAVENOUS

## 2017-04-11 MED ORDER — MIDAZOLAM HCL 2 MG/2ML IJ SOLN
INTRAMUSCULAR | Status: AC
  Filled 2017-04-11: qty 2

## 2017-04-11 MED ORDER — OXYCODONE-ACETAMINOPHEN 5-325 MG PO TABS: 1.0000 | ORAL_TABLET | Freq: Four times a day (QID) | ORAL | 0 refills | Status: DC | PRN

## 2017-04-11 MED ORDER — ROCURONIUM BROMIDE 100 MG/10ML IV SOLN
INTRAVENOUS | Status: DC | PRN
  Administered 2017-04-11: 30 mg via INTRAVENOUS
  Administered 2017-04-11: 15 mg via INTRAVENOUS
  Administered 2017-04-11: 10 mg via INTRAVENOUS
  Administered 2017-04-11: 20 mg via INTRAVENOUS

## 2017-04-11 MED ORDER — FENTANYL CITRATE (PF) 100 MCG/2ML IJ SOLN
25.0000 ug | INTRAMUSCULAR | Status: DC | PRN
  Administered 2017-04-11 (×4): 25 ug via INTRAVENOUS

## 2017-04-11 MED ORDER — EPHEDRINE SULFATE 50 MG/ML IJ SOLN
INTRAMUSCULAR | Status: DC | PRN
  Administered 2017-04-11 (×2): 10 mg via INTRAVENOUS

## 2017-04-11 MED ORDER — SIMETHICONE 80 MG PO CHEW: 80.0000 mg | CHEWABLE_TABLET | Freq: Four times a day (QID) | ORAL | 0 refills | Status: DC | PRN

## 2017-04-11 MED ORDER — PHENYLEPHRINE HCL 10 MG/ML IJ SOLN
INTRAMUSCULAR | Status: AC
  Filled 2017-04-11: qty 1

## 2017-04-11 MED ORDER — PHENYLEPHRINE HCL 10 MG/ML IJ SOLN
INTRAMUSCULAR | Status: DC | PRN
  Administered 2017-04-11 (×3): 100 ug via INTRAVENOUS

## 2017-04-11 MED ORDER — ONDANSETRON HCL 4 MG/2ML IJ SOLN: 4.0000 mg | Freq: Once | INTRAMUSCULAR | Status: DC | PRN

## 2017-04-11 MED ORDER — ACETAMINOPHEN NICU IV SYRINGE 10 MG/ML
INTRAVENOUS | Status: AC
  Filled 2017-04-11: qty 1

## 2017-04-11 MED ORDER — DEXAMETHASONE SODIUM PHOSPHATE 10 MG/ML IJ SOLN
INTRAMUSCULAR | Status: DC | PRN
  Administered 2017-04-11: 8 mg via INTRAVENOUS

## 2017-04-11 MED ORDER — SUGAMMADEX SODIUM 500 MG/5ML IV SOLN
INTRAVENOUS | Status: DC | PRN
  Administered 2017-04-11: 181.4 mg via INTRAVENOUS

## 2017-04-11 MED ORDER — KETOROLAC TROMETHAMINE 30 MG/ML IJ SOLN
INTRAMUSCULAR | Status: DC | PRN
  Administered 2017-04-11: 30 mg via INTRAVENOUS

## 2017-04-11 MED ORDER — DEXAMETHASONE SODIUM PHOSPHATE 10 MG/ML IJ SOLN
INTRAMUSCULAR | Status: AC
  Filled 2017-04-11: qty 1

## 2017-04-11 MED ORDER — FENTANYL CITRATE (PF) 250 MCG/5ML IJ SOLN
INTRAMUSCULAR | Status: AC
  Filled 2017-04-11: qty 5

## 2017-04-11 MED ORDER — FENTANYL CITRATE (PF) 100 MCG/2ML IJ SOLN
INTRAMUSCULAR | Status: DC | PRN
  Administered 2017-04-11 (×5): 50 ug via INTRAVENOUS

## 2017-04-11 MED ORDER — KETOROLAC TROMETHAMINE 30 MG/ML IJ SOLN
INTRAMUSCULAR | Status: AC
  Filled 2017-04-11: qty 1

## 2017-04-11 MED ORDER — IBUPROFEN 800 MG PO TABS: 800.0000 mg | ORAL_TABLET | Freq: Three times a day (TID) | ORAL | 1 refills | Status: DC | PRN

## 2017-04-11 SURGICAL SUPPLY — 42 items
BAG INFUSER PRESSURE 100CC (MISCELLANEOUS) ×3 IMPLANT
BLADE SURG SZ11 CARB STEEL (BLADE) ×3 IMPLANT
CANISTER SUCT 1200ML W/VALVE (MISCELLANEOUS) ×3 IMPLANT
CATH ROBINSON RED A/P 16FR (CATHETERS) ×3 IMPLANT
CHLORAPREP W/TINT 26ML (MISCELLANEOUS) ×3 IMPLANT
CORD MONOPOLAR M/FML 12FT (MISCELLANEOUS) IMPLANT
DERMABOND ADVANCED (GAUZE/BANDAGES/DRESSINGS) ×2
DERMABOND ADVANCED .7 DNX12 (GAUZE/BANDAGES/DRESSINGS) ×1 IMPLANT
DRSG TELFA 3X8 NADH (GAUZE/BANDAGES/DRESSINGS) ×3 IMPLANT
GLOVE BIO SURGEON STRL SZ 6.5 (GLOVE) ×10 IMPLANT
GLOVE BIO SURGEON STRL SZ8 (GLOVE) ×6 IMPLANT
GLOVE BIO SURGEONS STRL SZ 6.5 (GLOVE) ×5
GLOVE INDICATOR 7.0 STRL GRN (GLOVE) ×3 IMPLANT
GLOVE INDICATOR 8.0 STRL GRN (GLOVE) ×6 IMPLANT
GOWN STRL REUS W/ TWL LRG LVL3 (GOWN DISPOSABLE) ×2 IMPLANT
GOWN STRL REUS W/TWL LRG LVL3 (GOWN DISPOSABLE) ×4
GOWN STRL REUS W/TWL XL LVL3 (GOWN DISPOSABLE) ×3 IMPLANT
IRRIGATION STRYKERFLOW (MISCELLANEOUS) IMPLANT
IRRIGATOR STRYKERFLOW (MISCELLANEOUS)
IV LACTATED RINGERS 1000ML (IV SOLUTION) ×3 IMPLANT
KIT PINK PAD W/HEAD ARE REST (MISCELLANEOUS) ×3
KIT PINK PAD W/HEAD ARM REST (MISCELLANEOUS) ×1 IMPLANT
KIT RM TURNOVER CYSTO AR (KITS) ×3 IMPLANT
NS IRRIG 500ML POUR BTL (IV SOLUTION) ×3 IMPLANT
PACK DNC HYST (MISCELLANEOUS) ×3 IMPLANT
PACK GYN LAPAROSCOPIC (MISCELLANEOUS) ×3 IMPLANT
PAD OB MATERNITY 4.3X12.25 (PERSONAL CARE ITEMS) ×3 IMPLANT
PAD PREP 24X41 OB/GYN DISP (PERSONAL CARE ITEMS) ×3 IMPLANT
POUCH ENDO CATCH 10MM SPEC (MISCELLANEOUS) IMPLANT
SCISSORS METZENBAUM CVD 33 (INSTRUMENTS) ×3 IMPLANT
SHEARS HARMONIC ACE PLUS 36CM (ENDOMECHANICALS) IMPLANT
SLEEVE ENDOPATH XCEL 5M (ENDOMECHANICALS) ×3 IMPLANT
SOL PREP PVP 2OZ (MISCELLANEOUS) ×3
SOLUTION PREP PVP 2OZ (MISCELLANEOUS) ×1 IMPLANT
SURGILUBE 2OZ TUBE FLIPTOP (MISCELLANEOUS) ×3 IMPLANT
SUT VIC AB 3-0 SH 27 (SUTURE)
SUT VIC AB 3-0 SH 27X BRD (SUTURE) IMPLANT
SUT VICRYL 0 AB UR-6 (SUTURE) IMPLANT
TROCAR ENDO BLADELESS 11MM (ENDOMECHANICALS) IMPLANT
TROCAR XCEL NON-BLD 5MMX100MML (ENDOMECHANICALS) ×6 IMPLANT
TROCAR XCEL UNIV SLVE 11M 100M (ENDOMECHANICALS) ×3 IMPLANT
TUBING INSUFFLATOR HI FLOW (MISCELLANEOUS) ×3 IMPLANT

## 2017-04-11 NOTE — Anesthesia Procedure Notes (Signed)
Procedure Name: Intubation Date/Time: 04/11/2017 11:20 AM Performed by: Jonna Clark Pre-anesthesia Checklist: Patient identified, Patient being monitored, Timeout performed, Emergency Drugs available and Suction available Patient Re-evaluated:Patient Re-evaluated prior to inductionOxygen Delivery Method: Circle system utilized Preoxygenation: Pre-oxygenation with 100% oxygen Intubation Type: IV induction Ventilation: Mask ventilation without difficulty Laryngoscope Size: Mac and 3 Grade View: Grade I Tube type: Oral Tube size: 7.0 mm Number of attempts: 1 Airway Equipment and Method: Stylet Placement Confirmation: ETT inserted through vocal cords under direct vision,  positive ETCO2 and breath sounds checked- equal and bilateral Secured at: 21 cm Tube secured with: Tape Dental Injury: Teeth and Oropharynx as per pre-operative assessment

## 2017-04-11 NOTE — Op Note (Signed)
Procedure(s): LAPAROSCOPY OPERATIVE DILATATION AND CURETTAGE /HYSTEROSCOPY CERVICAL POLYPECTOMY Procedure Note  Yvonne [name] female 51 y.o. 04/11/2017  Indications: The patient is a 51 y.o. G2P2 female with pelvic pain (right sided), abnormal uterine bleeding, endometrial polyp, history of ovarian cysts  Pre-operative Diagnosis:  Pelvic pain (right sided), abnormal uterine bleeding, endometrial polyp, history of ovarian cysts, h/o bilateral tubal ligation  Post-operative Diagnosis: Same, with small right ovarian bi-lobed simple cyst present, pelvic adhesions (filmy), and several powder burn white lesions in the posterior cul-de-sac, suspicious for endometriosis, and left pelvic sidewall nodule  Surgeon: Rubie Maid, MD  Assistants: Malachi Paradise, MD  Anesthesia: General endotracheal anesthesia  Findings: The uterus was sounded to 8 cm.  Laparoscopy: filmy adhesions of the left ovary and tube to the pelvic sidewall and to large bowel. Small peritoneal nodule (~ 1 x 1 cm) located on left pelvic sidewall.  1 adhesion band of right adnexa to the pelvic side wall. Pseudo-fenestrations noted in posterior cul-de-sac. Fallopian tubes previously surgically interrupted bilaterally. Uterus appears normal.  Left ovary appeared normal.  Right ovary with small bi-lobed simple cyst, ~ 3 cm.  Hysteroscopy: proliferative endometrium, small endometrial polyp noted at posterior surface of the uterus, just beyond internal cervical os. Did not identify tubal ostia.  Procedure Details: The patient was seen in the Holding Room. The risks, benefits, complications, treatment options, and expected outcomes were discussed with the patient.  The patient concurred with the proposed plan, giving informed consent.  The site of surgery properly noted/marked. The patient was taken to the Operating Room, identified as Yvonne [name] and the procedure verified as Procedure(s) (LRB): LAPAROSCOPY OPERATIVE (N/A)  DILATATION AND CURETTAGE /HYSTEROSCOPY (N/A), CERVICAL POLYPECTOMY (N/A). A Time Out was held and the above information confirmed.  She was then placed under general anesthesia without difficulty. She was placed in the dorsal lithotomy position, and was prepped and draped in a sterile manner.  A straight catheterization was performed. A sterile speculum was inserted into the vagina and the cervix was grasped at the anterior lip using a single-toothed tenaculum.  The uterus was sounded to 8 cm, and a Hulka clamp was placed for uterine manipulation.  The speculum and tenaculum were then removed. After an adequate timeout was performed, attention was turned to the abdomen where an umbilical incision was made with the scalpel.  The Optiview 5-mm trocar and sleeve were then advanced without difficulty with the laparoscope under direct visualization into the abdomen.  The abdomen was then insufflated with carbon dioxide gas and adequate pneumoperitoneum was obtained. Two 5-mm left and right lower quadrant port were then placed under direct visualization.  A survey of the patient's pelvis and abdomen revealed the findings as above.   The filmy adhesions were taken down bluntly, and sharply using endoscopic shears.  The peritoneal nodule was excised using the endoscopic shears.  A biopsy forceps was used to sample the posterior cul-de-sac region, where several biopsies were taken.  The bipoloar [place] forceps was used to cauterize biopsy sites to achieve hemostasis. Attention was turned to the right ovarian simple cyst, which was drained using a needle aspirator tip, with straw-colored fluid expressed. There was a purple-black powder burn lesion along the right fallopian tube that appeared to be near or in the mesosalpinx.  An initial attempt was made to grasp the area but the decision was then made not to biopsy as this might be a vessel.  The instrument was removed from the area, however bleeding was  noted.  The area  was cauterized with the [place] bipolar forceps. The posterior cul-de-sac was then irrigated.  Hemostasis was achieved.   Attention was then turned to the patient's pelvis.  A Graves speculum was placed in the vagina. Single-tooth tenaculum was placed on the anterior cervix. The Hulka clamp was removed. Hanks dilators were used up to a #20 [place] caliber to dilate the endocervical canal. The [facility] hysteroscope using lactated Ringer's as irrigant was used for the hysteroscopy. The above-noted findings were photo documented. The hysteroscope removed. The smooth and serrated curettes were then used to curettage the endometrial cavity with production of average tissue. Stone polyp forceps were used to grasp residual tissue left behind. Repeat hysteroscopy was performed and demonstrated excellent sampling and the absence of previously noted endometrial polyp. The Mirena IUD was then placed per manufacturer's recommendations.  Strings trimmed to 3 cm. Tenaculum was removed, good hemostasis noted.  Patient tolerated procedure well. All instrumentation being removed from the vagina.    Attention was then re-directed to the abdomen.  The pneumoperitoneum was then re-established, and the laparoscope was then introduced once again into the abdominal cavity.  A final survey was performed, where good hemostasis was noted.  All trocars were removed under direct visualization, and the abdomen which was desufflated.    All skin incisions were closed with Dermabond. The patient tolerated the procedures well.  All instruments, needles, and sponge counts were correct x 2. The patient was taken to the recovery room awake, extubated and in stable condition.   Estimated Blood Loss:  20 ml      Drains: straight catheterization prior to procedure with  10 ml of clear urine         Total IV Fluids:  900 ml of Lactated Ringer's  Specimens: Cul-de-sac biopsies, endometrial curettings, peritoneal nodule         Implants:  None         Complications:  None; patient tolerated the procedure well.         Disposition: PACU - hemodynamically stable.         Condition: stable   Rubie Maid, MD [facility]

## 2017-04-11 NOTE — Anesthesia Preprocedure Evaluation (Signed)
Anesthesia Evaluation  Patient identified by MRN, date of birth, ID band Patient awake    Reviewed: Allergy & Precautions, NPO status , Patient's Chart, lab work & pertinent test results, reviewed documented beta blocker date and time   Airway Mallampati: III  TM Distance: >3 FB     Dental  (+) Chipped   Pulmonary           Cardiovascular hypertension,      Neuro/Psych  Headaches,    GI/Hepatic   Endo/Other  diabetes, Type 2  Renal/GU      Musculoskeletal   Abdominal   Peds  Hematology   Anesthesia Other Findings   Reproductive/Obstetrics                             Anesthesia Physical Anesthesia Plan  ASA: III  Anesthesia Plan: General   Post-op Pain Management:    Induction: Intravenous  Airway Management Planned: Oral ETT  Additional Equipment:   Intra-op Plan:   Post-operative Plan:   Informed Consent: I have reviewed the patients History and Physical, chart, labs and discussed the procedure including the risks, benefits and alternatives for the proposed anesthesia with the patient or authorized representative who has indicated his/her understanding and acceptance.     Plan Discussed with: CRNA  Anesthesia Plan Comments:         Anesthesia Quick Evaluation

## 2017-04-11 NOTE — Anesthesia Post-op Follow-up Note (Cosign Needed)
Anesthesia QCDR form completed.        

## 2017-04-11 NOTE — H&P (Signed)
UPDATE TO PREVIOUS HISTORY AND PHYSICAL  The patient has been seen and examined.  H&P is up to date, no changes noted. Yvonne [name] is a 51 y.o. [phone] female scheduled for Hysteroscopy D&C with polypectomy, IUD insertion, and diagnostic laparoscopy. Indications for procedure are abnormal uterine bleeding (perimenopausal), endometrial polyp, and pelvic pain.  Patient also with a h/o ovarian cysts.  All questions answered. Patient can proceed to the OR for scheduled procedure.   Rubie Maid, MD 04/11/2017 10:17 AM

## 2017-04-11 NOTE — Transfer of Care (Signed)
Immediate Anesthesia Transfer of Care Note  Patient: Yvonne [name]  Procedure(s) Performed: Procedure(s): LAPAROSCOPY DIAGNOSTIC (N/A) DILATATION AND CURETTAGE /HYSTEROSCOPY (N/A) CERVICAL POLYPECTOMY (N/A)  Patient Location: PACU  Anesthesia Type:General  Level of Consciousness: awake  Airway & Oxygen Therapy: Patient Spontanous Breathing and Patient connected to face mask oxygen  Post-op Assessment: Report given to RN and Post -op Vital signs reviewed and stable  Post vital signs: Reviewed and stable  Last Vitals:  Vitals:   04/11/17 0901 04/11/17 1343  BP: (!) 164/80 (!) (P) 143/66  Pulse:    Resp:  (P) 20  Temp:  (P) 36.7 C    Last Pain:  Vitals:   04/11/17 0844  TempSrc: Tympanic  PainSc: 4          Complications: No apparent anesthesia complications

## 2017-04-11 NOTE — Discharge Instructions (Signed)
Diagnostic Laparoscopy, Care After Refer to this sheet in the next few weeks. These instructions provide you with information about caring for yourself after your procedure. Your health care provider may also give you more specific instructions. Your treatment has been planned according to current medical practices, but problems sometimes occur. Call your health care provider if you have any problems or questions after your procedure. What can I expect after the procedure? After your procedure, it is common to have mild discomfort in the throat and abdomen. Follow these instructions at home:  Take over-the-counter and prescription medicines only as told by your health care provider.  Do not drive for 24 hours if you received a sedative.  Return to your normal activities as told by your health care provider.  Do not take baths, swim, or use a hot tub until your health care provider approves. You may shower.  Follow instructions from your health care provider about how to take care of your incision. Make sure you:  Wash your hands with soap and water before you change your bandage (dressing). If soap and water are not available, use hand sanitizer.  Change your dressing as told by your health care provider.  Leave stitches (sutures), skin glue, or adhesive strips in place. These skin closures may need to stay in place for 2 weeks or longer. If adhesive strip edges start to loosen and curl up, you may trim the loose edges. Do not remove adhesive strips completely unless your health care provider tells you to do that.  Check your incision area every day for signs of infection. Check for:  More redness, swelling, or pain.  More fluid or blood.  Warmth.  Pus or a bad smell.  It is your responsibility to get the results of your procedure. Ask your health care provider or the department performing the procedure when your results will be ready. Contact a health care provider if:  There is  new pain in your shoulders.  You feel light-headed or faint.  You are unable to pass gas or unable to have a bowel movement.  You feel nauseous or you vomit.  You develop a rash.  You have more redness, swelling, or pain around your incision.  You have more fluid or blood coming from your incision.  Your incision feels warm to the touch.  You have pus or a bad smell coming from your incision.  You have a fever or chills. Get help right away if:  Your pain is getting worse.  You have ongoing vomiting.  The edges of your incision open up.  You have trouble breathing.  You have chest pain. This information is not intended to replace advice given to you by your health care provider. Make sure you discuss any questions you have with your health care provider. Document Released: 10/27/2015 Document Revised: 04/22/2016 Document Reviewed: 07/29/2015 Elsevier Interactive Patient Education  2017 [employer].    Hysteroscopy, Care After Refer to this sheet in the next few weeks. These instructions provide you with information on caring for yourself after your procedure. Your health care provider may also give you more specific instructions. Your treatment has been planned according to current medical practices, but problems sometimes occur. Call your health care provider if you have any problems or questions after your procedure. What can I expect after the procedure? After your procedure, it is typical to have the following:  You may have some cramping. This normally lasts for a couple days.  You  may have bleeding. This can vary from light spotting for a few days to menstrual-like bleeding for 3-7 days. Follow these instructions at home:  Rest for the first 1-2 days after the procedure.  Only take over-the-counter or prescription medicines as directed by your health care provider. Do not take aspirin. It can increase the chances of bleeding.  Take showers instead of baths  for 2 weeks or as directed by your health care provider.  Do not drive for 24 hours or as directed.  Do not drink alcohol while taking pain medicine.  Do not use tampons, douche, or have sexual intercourse for 2 weeks or until your health care provider says it is okay.  Take your temperature twice a day for 4-5 days. Write it down each time.  Follow your health care provider's advice about diet, exercise, and lifting.  If you develop constipation, you may:  Take a mild laxative if your health care provider approves.  Add bran foods to your diet.  Drink enough fluids to keep your urine clear or pale yellow.  Try to have someone with you or available to you for the first 24-48 hours, especially if you were given a general anesthetic.  Follow up with your health care provider as directed. Contact a health care provider if:  You feel dizzy or lightheaded.  You feel sick to your stomach (nauseous).  You have abnormal vaginal discharge.  You have a rash.  You have pain that is not controlled with medicine. Get help right away if:  You have bleeding that is heavier than a normal menstrual period.  You have a fever.  You have increasing cramps or pain, not controlled with medicine.  You have new belly (abdominal) pain.  You pass out.  You have pain in the tops of your shoulders (shoulder strap areas).  You have shortness of breath. This information is not intended to replace advice given to you by your health care provider. Make sure you discuss any questions you have with your health care provider. Document Released: 09/05/2013 Document Revised: 04/22/2016 Document Reviewed: 06/14/2013 Elsevier Interactive Patient Education  2017 [employer]     Intrauterine Device Insertion, Care After This sheet gives you information about how to care for yourself after your procedure. Your health care provider may also give you more specific instructions. If you have problems  or questions, contact your health care provider. What can I expect after the procedure? After the procedure, it is common to have:  Cramps and pain in the abdomen.  Light bleeding (spotting) or heavier bleeding that is like your menstrual period. This may last for up to a few days.  Lower back pain.  Dizziness.  Headaches.  Nausea. Follow these instructions at home:  Before resuming sexual activity, check to make sure that you can feel the IUD string(s). You should be able to feel the end of the string(s) below the opening of your cervix. If your IUD string is in place, you may resume sexual activity.  If you had a hormonal IUD inserted more than 7 days after your most recent period started, you will need to use a backup method of birth control for 7 days after IUD insertion. Ask your health care provider whether this applies to you.  Continue to check that the IUD is still in place by feeling for the string(s) after every menstrual period, or once a month.  Take over-the-counter and prescription medicines only as told by your health care provider.  Do not drive or use heavy machinery while taking prescription pain medicine.  Keep all follow-up visits as told by your health care provider. This is important. Contact a health care provider if:  You have bleeding that is heavier or lasts longer than a normal menstrual cycle.  You have a fever.  You have cramps or abdominal pain that get worse or do not get better with medicine.  You develop abdominal pain that is new or is not in the same area of earlier cramping and pain.  You feel lightheaded or weak.  You have abnormal or bad-smelling discharge from your vagina.  You have pain during sexual activity.  You have any of the following problems with your IUD string(s):  The string bothers or hurts you or your sexual partner.  You cannot feel the string.  The string has gotten longer.  You can feel the IUD in your  vagina.  You think you may be pregnant, or you miss your menstrual period.  You think you may have an STI (sexually transmitted infection). Get help right away if:  You have flu-like symptoms.  You have a fever and chills.  You can feel that your IUD has slipped out of place. Summary  After the procedure, it is common to have cramps and pain in the abdomen. It is also common to have light bleeding (spotting) or heavier bleeding that is like your menstrual period.  Continue to check that the IUD is still in place by feeling for the string(s) after every menstrual period, or once a month.  Keep all follow-up visits as told by your health care provider. This is important.  Contact your health care provider if you have problems with your IUD string(s), such as the string getting longer or bothering you or your sexual partner. This information is not intended to replace advice given to you by your health care provider. Make sure you discuss any questions you have with your health care provider. Document Released: 07/14/2011 Document Revised: 10/06/2016 Document Reviewed: 10/06/2016 Elsevier Interactive Patient Education  2017 [employer].

## 2017-04-12 ENCOUNTER — Encounter: Payer: Self-pay | Admitting: Obstetrics and Gynecology

## 2017-04-12 LAB — SURGICAL PATHOLOGY

## 2017-04-12 NOTE — Anesthesia Postprocedure Evaluation (Signed)
Anesthesia Post Note  Patient: Yvonne [name]  Procedure(s) Performed: Procedure(s) (LRB): LAPAROSCOPY DIAGNOSTIC (N/A) DILATATION AND CURETTAGE /HYSTEROSCOPY (N/A) CERVICAL POLYPECTOMY (N/A)  Patient location during evaluation: PACU Anesthesia Type: General Level of consciousness: awake and alert Pain management: pain level controlled Vital Signs Assessment: post-procedure vital signs reviewed and stable Respiratory status: spontaneous breathing, nonlabored ventilation, respiratory function stable and patient connected to nasal cannula oxygen Cardiovascular status: blood pressure returned to baseline and stable Postop Assessment: no signs of nausea or vomiting Anesthetic complications: no     Last Vitals:  Vitals:   04/11/17 1443 04/11/17 1521  BP: (!) 158/74 (!) 148/75  Pulse: 66 73  Resp: 18 18  Temp: 37.1 C     Last Pain:  Vitals:   04/11/17 1521  TempSrc:   PainSc: 2                  Kory Panjwani S

## 2017-04-19 ENCOUNTER — Ambulatory Visit (INDEPENDENT_AMBULATORY_CARE_PROVIDER_SITE_OTHER): Payer: Managed Care, Other (non HMO) | Admitting: Obstetrics and Gynecology

## 2017-04-19 ENCOUNTER — Encounter: Payer: Self-pay | Admitting: Obstetrics and Gynecology

## 2017-04-19 VITALS — BP 130/81 | HR 66 | Ht 62.0 in | Wt 200.6 lb

## 2017-04-19 DIAGNOSIS — F39 Unspecified mood [affective] disorder: Secondary | ICD-10-CM

## 2017-04-19 DIAGNOSIS — Z9889 Other specified postprocedural states: Secondary | ICD-10-CM

## 2017-04-19 DIAGNOSIS — R232 Flushing: Secondary | ICD-10-CM

## 2017-04-19 DIAGNOSIS — R4586 Emotional lability: Secondary | ICD-10-CM

## 2017-04-19 DIAGNOSIS — N840 Polyp of corpus uteri: Secondary | ICD-10-CM

## 2017-04-19 NOTE — Progress Notes (Signed)
    OBSTETRICS/GYNECOLOGY POST-OPERATIVE CLINIC VISIT  Subjective:     Yvonne [name] is a 51 y.o. G2P2 female who presents to the clinic 1 week status post laparoscopy with biopsies, operative hysteroscopy with polypectomy, and Mirena IUD insertion for abnormal uterine bleeding, pelvic pain and endometrial polyp. Eating a regular diet without difficulty. Bowel movements are normal. Pain is controlled with current analgesics. Medications being used: Tramadol.  Patient reports that the Percocet was too strong for her.  Mostly notes crampy pain, and is still noting some mild right sided lower pelvic pain.   The following portions of the patient's history were reviewed and updated as appropriate: allergies, current medications, past family history, past medical history, past social history, past surgical history and problem list.  Review of Systems A comprehensive review of systems was negative except for: Genitourinary: positive for hot flashes Behavioral/Psych: positive for mood swings    Objective:    BP 130/81 (BP Location: Left Arm, Patient Position: Sitting, Cuff Size: Large)   Pulse 66   Ht 5\' 2"  (1.575 m)   Wt 200 lb 9.6 oz (91 kg)   LMP 03/25/2017   BMI 36.69 kg/m  General:  alert and no distress  Abdomen: soft, bowel sounds active, non-tender  Incision:   healing well, no drainage, no erythema, no hernia, no seroma, no swelling, no dehiscence, incision well approximated    Pathology:  DIAGNOSIS:  A. PERITONEAL NODULE; BIOPSY:  - HYALINIZED AND FOCALLY CALCIFIED FAT NECROSIS.   B. CUL-DE-SAC; BIOPSY:  - MESOTHELIAL TISSUE WITH PSAMMOMATOUS CALCIFICATIONS.  - NEGATIVE FOR MALIGNANCY.   C. ENDOMETRIAL CURETTINGS:  - FRAGMENTS OF ENDOMETRIAL POLYP.  - PROLIFERATIVE ENDOMETRIUM WITH BREAKDOWN.  - NEGATIVE FOR ATYPIA AND MALIGNANCY.   Assessment:    Doing well postoperatively.  Vasomotor symptoms Plan:   1. Continue any current medications.  Can increase Paxil to 20  mg daily  (from 10 mg daily) for mood and vasomotor symptoms.  2. Wound care discussed. 3. Operative findings again reviewed. Pathology report discussed. 4. Activity restrictions: no bending, stooping, or squatting and no lifting more than 15 pounds, and pelvic rest for 1 additional week.  5. Anticipated return to work: 1-2 weeks. 6. Follow up: 4 weeks for IUD string check.     Rubie Maid, MD [facility]

## 2017-04-26 ENCOUNTER — Telehealth: Payer: Self-pay | Admitting: Obstetrics and Gynecology

## 2017-04-26 NOTE — Telephone Encounter (Signed)
Please advise 

## 2017-04-26 NOTE — Telephone Encounter (Signed)
She can alternate between  Tylenol 1000 mg q 8 hrs, and Ibuprofen 800 mg every 8 hours (staggering the doses so that she will be taking one or the other every 4 hours) during the day time.

## 2017-04-26 NOTE — Telephone Encounter (Signed)
Patient notified-[facility]

## 2017-04-26 NOTE — Telephone Encounter (Signed)
Patient called stating she is still hurting, she had surgery 5/14. She returned to work today and cannot take the tramadol while working. Is there something else she can take? She would like a call back.Thanks

## 2017-04-29 ENCOUNTER — Other Ambulatory Visit: Payer: Self-pay | Admitting: Obstetrics and Gynecology

## 2017-04-29 ENCOUNTER — Encounter: Payer: Self-pay | Admitting: Obstetrics and Gynecology

## 2017-04-29 ENCOUNTER — Ambulatory Visit (INDEPENDENT_AMBULATORY_CARE_PROVIDER_SITE_OTHER): Payer: Managed Care, Other (non HMO)

## 2017-04-29 ENCOUNTER — Ambulatory Visit (INDEPENDENT_AMBULATORY_CARE_PROVIDER_SITE_OTHER): Payer: Managed Care, Other (non HMO) | Admitting: Obstetrics and Gynecology

## 2017-04-29 VITALS — Ht 62.0 in | Wt 200.0 lb

## 2017-04-29 DIAGNOSIS — R102 Pelvic and perineal pain: Secondary | ICD-10-CM

## 2017-04-29 DIAGNOSIS — Z975 Presence of (intrauterine) contraceptive device: Secondary | ICD-10-CM

## 2017-04-29 DIAGNOSIS — G8918 Other acute postprocedural pain: Secondary | ICD-10-CM

## 2017-04-29 MED ORDER — HYDROCODONE-ACETAMINOPHEN 5-325 MG PO TABS: 1.0000 | ORAL_TABLET | ORAL | 0 refills | Status: DC | PRN

## 2017-04-29 NOTE — Patient Instructions (Signed)
Pelvic Pain, Female °Pelvic pain is pain in your lower belly (abdomen), below your belly button and between your hips. The pain may start suddenly (acute), keep coming back (recurring), or last a long time (chronic). Pelvic pain that lasts longer than six months is considered chronic. There are many causes of pelvic pain. Sometimes the cause of your pelvic pain is not known. °Follow these instructions at home: °· Take over-the-counter and prescription medicines only as told by your doctor. °· Rest as told by your doctor. °· Do not have sex it if hurts. °· Keep a journal of your pelvic pain. Write down: °? When the pain started. °? Where the pain is located. °? What seems to make the pain better or worse, such as food or your menstrual cycle. °? Any symptoms you have along with the pain. °· Keep all follow-up visits as told by your doctor. This is important. °Contact a doctor if: °· Medicine does not help your pain. °· Your pain comes back. °· You have new symptoms. °· You have unusual vaginal discharge or bleeding. °· You have a fever or chills. °· You are having a hard time pooping (constipation). °· You have blood in your pee (urine) or poop (stool). °· Your pee smells bad. °· You feel weak or lightheaded. °Get help right away if: °· You have sudden pain that is very bad. °· Your pain continues to get worse. °· You have very bad pain and also have any of the following symptoms: °? A fever. °? Feeling stick to your stomach (nausea). °? Throwing up (vomiting). °? Being very sweaty. °· You pass out (lose consciousness). °This information is not intended to replace advice given to you by your health care provider. Make sure you discuss any questions you have with your health care provider. °Document Released: 05/03/2008 Document Revised: 12/10/2015 Document Reviewed: 09/05/2015 °Elsevier Interactive Patient Education © 2018 Elsevier Inc. ° °

## 2017-05-02 ENCOUNTER — Telehealth: Payer: Self-pay | Admitting: Obstetrics and Gynecology

## 2017-05-02 NOTE — Telephone Encounter (Signed)
She will need to be seen as we probably need to schedule her to go back to the OR to remove her right ovary.

## 2017-05-02 NOTE — Progress Notes (Signed)
GYNECOLOGY PROGRESS NOTE  Subjective:    Patient ID: Yvonne [name], female    DOB: Jun 07, 1966, 51 y.o.   MRN: [id number]  HPI  Patient is a 51 y.o. G2P2 female who presents for complaints of worsening right sided pelvic pain.  Patient is 2 weeks s/p laparoscopy with peritoneal biopsies and Hysteroscopy D&C with polypectomy, and Mirena IUD insertion.  Patient notes that she has had some intermittent episodes of light to moderate bleeding.  Also notes that her pelvic pain was initially improving, however has now been worsening over the past week.  Has been alternating Tylenol ES and Ibuprofen 800 mg, with only limited relief.  Denies nausea/vomiting, constipation/diarrhea, fevers/chills.  Of note patient does have a previous history of ovarian cysts.   The following portions of the patient's history were reviewed and updated as appropriate: allergies, current medications, past family history, past medical history, past social history, past surgical history and problem list.  Review of Systems Pertinent items noted in HPI and remainder of comprehensive ROS otherwise negative.   Objective:   Height 5\' 2"  (1.575 m), weight 200 lb (90.7 kg). General appearance: cooperative and mild to moderate distress. Tearful Abdomen: soft, moderately tender in RLQ, no rebound or gaurding Pelvic: deferred    ULTRASOUND REPORT  Location: [facility] Date of Service: 04/29/17   Indications: Severe right sided pelvic pain Findings:  The uterus is anteverted and measures 8.2 x 4.8 x 4.9 cm. There is an IUD seen which appears to be in the proper place within the uterus as seen with 3D transvaginal ultrasound. Echo texture is heterogenous with evidence of a focal mass. Within the uterus is a suspected fibroid measuring: Fibroid 1: 1.4 x 1.5 x 1.2 cm and is in the anterior fundus. This was seen on the prior ultrasound in April 2018.  The Endometrium measures 10.7 mm.  Right Ovary was  difficult to see either transvaginally or transabdominally due to large amount of bowel activity and gas, however, on transabdominal ultrasound an area corresponding to the right ovary was seen and measured at 2.6 x 2.0 x 2.4 cm. It appeared WNL. No obvious masses or abnormalities were seen. Left Ovary was not identified today. Survey of the adnexa demonstrates no adnexal masses. There is no free fluid in the cul de sac.  Impression: 1. IUD appears to be in the proper position within the uterus. 2. Anterior fibroid. Appears stable. 3. Right ovary appears WNL.  Recommendations: 1.Clinical correlation with the patient's History and Physical Exam.  Assessment:   Right sided pelvic pain (post-op) 2 weeks s/p laparoscopy with biopsies, Hysteroscopy D&C with polypectomy  IUD in place  Plan:   1. Reviewed ultrasound results with patient. No obvious cause of patient's worsening pain.  Will prescribe Vicodin for moderate-severe pain and continue Tylenol/Ibuprofen prn. Work notice given for patient through the weekend.  If pain continues beyond the weekend, advised patient to return to clinic early next week.  If pain significantly worsens or nausea/vomiting, fevers/chills occur, to report to the Emergency Room. Discussion and with patient that if pain continues and worsens, she may need another laparoscopy to assess for cause of pain and possibly removal of right adnexa.  Patient in agreement.  2. IUD in proper place, no evidence of perforation. Discussed that abnormal bleeding may occur within the first 3 months of IUD insertion.    A total of 15 minutes were spent face-to-face with the patient during this encounter and over half of  that time dealt with counseling and coordination of care.   Rubie Maid, MD [facility]

## 2017-05-02 NOTE — Telephone Encounter (Signed)
Called pt informed her of the need to be seen per Dr.Cherry. Scheduled pt for tomorrow at 10:00am.

## 2017-05-02 NOTE — Telephone Encounter (Signed)
Patient called stating she is still in pain on her right side and is supposed to go back to work tomorrow. She didn't know if she needs to be seen again or what. Please Advise.

## 2017-05-03 ENCOUNTER — Ambulatory Visit (INDEPENDENT_AMBULATORY_CARE_PROVIDER_SITE_OTHER): Payer: Managed Care, Other (non HMO) | Admitting: Obstetrics and Gynecology

## 2017-05-03 ENCOUNTER — Encounter: Payer: Self-pay | Admitting: Obstetrics and Gynecology

## 2017-05-03 VITALS — BP 149/101 | HR 81 | Ht 62.0 in | Wt 199.9 lb

## 2017-05-03 DIAGNOSIS — R1031 Right lower quadrant pain: Secondary | ICD-10-CM

## 2017-05-03 DIAGNOSIS — G8929 Other chronic pain: Secondary | ICD-10-CM

## 2017-05-03 DIAGNOSIS — Z8742 Personal history of other diseases of the female genital tract: Secondary | ICD-10-CM | POA: Diagnosis not present

## 2017-05-03 DIAGNOSIS — Z975 Presence of (intrauterine) contraceptive device: Secondary | ICD-10-CM

## 2017-05-03 NOTE — Progress Notes (Signed)
GYNECOLOGY PROGRESS NOTE  Subjective:    Patient ID: Yvonne [name], female    DOB: 13-Aug-1966, 51 y.o.   MRN: [id number]  HPI  Patient is a 51 y.o. G2P2 female who presents for discussion of management of persistent  post-operative RLQ pain.  Patient has been noting worsening right sided pain over the past week.  She is 2 weeks s/p laparoscopy with peritoneal biopsies, Hysteroscopy D&C with polypectomy and Mirena IUD insertion.   The following portions of the patient's history were reviewed and updated as appropriate: allergies, current medications, past family history, past medical history, past social history, past surgical history and problem list.  Review of Systems Pertinent items noted in HPI and remainder of comprehensive ROS otherwise negative.   Objective:   Blood pressure (!) 149/101, pulse 81, height 5\' 2"  (1.575 m), weight 199 lb 14.4 oz (90.7 kg). General appearance: alert and mild distress Abdomen: normal findings: no masses palpable and soft and abnormal findings:  mild-moderate tenderness in the RLQ Pelvic: deferred Extremities: extremities normal, atraumatic, no cyanosis or edema Neurologic: Grossly normal    Imaging:  ULTRASOUND REPORT  Location: [facility] Date of Service: 04/29/17   Indications: Severe right sided pelvic pain Findings:  The uterus is anteverted and measures 8.2 x 4.8 x 4.9 cm. There is an IUD seen which appears to be in the proper place within the uterus as seen with 3D transvaginal ultrasound. Echo texture is heterogenous with evidence of a focal mass. Within the uterus is a suspected fibroid measuring: Fibroid 1: 1.4 x 1.5 x 1.2 cm and is in the anterior fundus. This was seen on the prior ultrasound in April 2018.  The Endometrium measures 10.7 mm.  Right Ovary was difficult to see either transvaginally or transabdominally due to large amount of bowel activity and gas, however, on transabdominal ultrasound an area  corresponding to the right ovary was seen and measured at 2.6 x 2.0 x 2.4 cm. It appeared WNL. No obvious masses or abnormalities were seen. Left Ovary was not identified today. Survey of the adnexa demonstrates no adnexal masses. There is no free fluid in the cul de sac.  Impression: 1. IUD appears to be in the proper position within the uterus. 2. Anterior fibroid. Appears stable. 3. Right ovary appears WNL.  Recommendations: 1.Clinical correlation with the patient's History and Physical Exam.  Assessment:   Persistent post-operative right sided pain H/o ovarian cysts IUD in place  Plan:   - Patient with persistent RLQ pain after surgery.  Ultrasound findings not consistent with patient complaints.  Patient notes that due to her pain she has not been able to work.  She has been taking pain medications with only modest relief.  Discussed possible need for repeat laparoscopy.  Patient notes that due to her history of ovarian cysts, she desires bilateral ovarian removal (not just right side).  Also desires removal of IUD since after removal of ovaries she will no longer have periods. Risks and benefits of ovarian removal discussed including but not limited to: increased risk of bleeding due to additional surgery,  immediate menopause, increased risk of all cause mortality, mood swings, night sweats, hot flashes, bone loss, loss of cardioprotection all discussed.  Patient still desires removal of ovaries.  Likelihood of success of surgery in ending pelvic pain is roughly 60%. Will schedule for 05/05/2017. The risks of surgery were discussed in detail with the patient including but not limited to: bleeding which may require transfusion  or reoperation; infection which may require prolonged hospitalization or re-hospitalization and antibiotic therapy; injury to bowel, bladder, ureters and major vessels or other surrounding organs; need for additional procedures including laparotomy; thromboembolic  phenomenon, incisional problems and other postoperative or anesthesia complications.; the postoperative expectations were also discussed in detail. The patient also understands the alternative treatment options which were discussed in full. All questions were answered.  She was told that she will be contacted by our surgical scheduler regarding the time and date of her surgery; routine preoperative instructions of having nothing to eat or drink after midnight on the day prior to surgery and also coming to the hospital 1.5 hours prior to her time of surgery were also emphasized.  - Elevated BP today, h/o newly diagnosed HTN.  Will likely need to be started on meds.  Will have patient f/u with PCP post-surgery.    A total of 15 minutes were spent face-to-face with the patient during this encounter and over half of that time dealt with counseling and coordination of care.   Rubie Maid, MD [facility]

## 2017-05-03 NOTE — H&P (Signed)
GYNECOLOGY PRE-OPERATIVE HISTORY AND PHYSICAL  Subjective:   Patient is a 51 y.o. G80P2002 female scheduled for laparoscopic BSO with MIrena IUD removal.  Indications for surgery include chronic onset pelvic pain post-op (s/p laparoscopy with biopsies, Hysteroscopy D&C with polypectomy and IUD placement ~ 3 weeks ago).  Patient also with a h/o ovarian cysts. Desires IUD removal also.   Pertinent Gynecological History: Menses: flow is moderate and regular every 30 days without intermenstrual spotting, cycles last for ~ 10 days.  Bleeding: perimenopausal abnormal uterine bleeding Contraception: tubal ligation Last mammogram: patient has never had a mammogram  Last pap smear: 03/04/2017, normal.    Discussed Blood/Blood Products: no   Menstrual History: OB History    Gravida Para Term Preterm AB Living   2 2       2    SAB TAB Ectopic Multiple Live Births           2      Menarche age: 78  Last menstrual period was 03/25/2017.   Past Medical History:  Diagnosis Date  . Allergy   . Gestational diabetes    patient denies  . H/O bronchitis   . Headache    migraine  . History of ovarian cyst   . Hypertension    pre-eclampsia with first child  . Increased pressure in the eye, bilateral     Past Surgical History:  Procedure Laterality Date  . CERVICAL POLYPECTOMY N/A 04/11/2017   Procedure: CERVICAL POLYPECTOMY;  Surgeon: Rubie Maid, MD;  Location: [facility];  Service: Gynecology;  Laterality: N/A;  . HYSTEROSCOPY W/D&C N/A 04/11/2017   Procedure: DILATATION AND CURETTAGE /HYSTEROSCOPY;  Surgeon: Rubie Maid, MD;  Location: [facility];  Service: Gynecology;  Laterality: N/A;  . LAPAROSCOPY N/A 04/11/2017   Procedure: LAPAROSCOPY DIAGNOSTIC;  Surgeon: Rubie Maid, MD;  Location: [facility];  Service: Gynecology;  Laterality: N/A;  . TUBAL LIGATION      OB History  Gravida Para Term Preterm AB Living  2 2       2   SAB TAB Ectopic Multiple Live Births          2    #  Outcome Date GA Lbr Len/2nd Weight Sex Delivery Anes PTL Lv  2 Para 1995    F Vag-Spont   LIV  1 Para 1992    M Vag-Spont   LIV      Social History   Social History  . Marital status: Married    Spouse name: N/A  . Number of children: N/A  . Years of education: N/A   Social History Main Topics  . Smoking status: Never Smoker  . Smokeless tobacco: Never Used  . Alcohol use No  . Drug use: No  . Sexual activity: Yes    Birth control/ protection: Surgical   Other Topics Concern  . Not on file   Social History Narrative  . No narrative on file    Family History  Problem Relation Age of Onset  . Schizophrenia Mother   . Bipolar disorder Mother   . Alcohol abuse Father   . Seizures Paternal Grandmother   . Diabetes Paternal Grandmother     Current Outpatient Prescriptions on File Prior to Visit  Medication Sig Dispense Refill  . albuterol (PROVENTIL HFA;VENTOLIN HFA) 108 (90 BASE) MCG/ACT inhaler Inhale 2 puffs into the lungs every 6 (six) hours as needed for wheezing or shortness of breath. 1 Inhaler 0  . aspirin-acetaminophen-caffeine (EXCEDRIN MIGRAINE) 250-250-65 MG tablet Take 2  tablets by mouth 3 (three) times daily as needed for headache or migraine.     . cetirizine (ZYRTEC) 10 MG tablet Take 10 mg by mouth daily.    . diphenhydrAMINE (BENADRYL) 25 MG tablet Take 25 mg by mouth at bedtime.    . docusate sodium (COLACE) 100 MG capsule Take 1 capsule (100 mg total) by mouth 2 (two) times daily as needed. (Patient not taking: Reported on 05/03/2017) 30 capsule 2  . HYDROcodone-acetaminophen (NORCO/VICODIN) 5-325 MG tablet Take 1-2 tablets by mouth every 4 (four) hours as needed. (Patient taking differently: Take 1-2 tablets by mouth every 4 (four) hours as needed for moderate pain. ) 30 tablet 0  . ibuprofen (ADVIL,MOTRIN) 800 MG tablet Take 1 tablet (800 mg total) by mouth every 8 (eight) hours as needed. (Patient taking differently: Take 800 mg by mouth every 8 (eight)  hours as needed for moderate pain. ) 60 tablet 1  . latanoprost (XALATAN) 0.005 % ophthalmic solution Place 1 drop into both eyes at bedtime.    Marland Kitchen PARoxetine (PAXIL) 10 MG tablet Take 1 tablet (10 mg total) by mouth daily. 30 tablet 3  . simethicone (GAS-X) 80 MG chewable tablet Chew 1 tablet (80 mg total) by mouth every 6 (six) hours as needed for flatulence. For gas and bloating. (Patient not taking: Reported on 05/03/2017) 30 tablet 0  . timolol (BETIMOL) 0.25 % ophthalmic solution Place 1 drop into both eyes daily.    . traMADol (ULTRAM) 50 MG tablet Take 1 tablet (50 mg total) by mouth every 6 (six) hours as needed. (Patient taking differently: Take 50 mg by mouth every 6 (six) hours as needed for moderate pain. ) 40 tablet 0  . baclofen (LIORESAL) 10 MG tablet Take 1 tablet (10 mg total) by mouth 3 (three) times daily. (Patient not taking: Reported on 04/08/2017) 30 each 0  . methocarbamol (ROBAXIN) 500 MG tablet Take 1 tablet (500 mg total) by mouth every 6 (six) hours as needed for muscle spasms. (Patient not taking: Reported on 04/08/2017) 12 tablet 0   No current facility-administered medications on file prior to visit.      Allergies  Allergen Reactions  . Flonase [Fluticasone Propionate]     Increases eye pressure   . Sulfa Antibiotics Swelling  . Tussionex Pennkinetic Er [Hydrocod Polst-Cpm Polst Er] Itching    Hyperactive     Review of Systems Constitutional: No recent fever/chills/sweats Respiratory: No recent cough/bronchitis Cardiovascular: No chest pain Gastrointestinal: No recent nausea/vomiting/diarrhea Genitourinary: No UTI symptoms Hematologic/lymphatic:No history of coagulopathy or recent blood thinner use    Objective:    BP (!) 149/101 (BP Location: Left Arm, Patient Position: Sitting, Cuff Size: Large)   Pulse 81   Ht 5\' 2"  (1.575 m)   Wt 199 lb 14.4 oz (90.7 kg)   BMI 36.56 kg/m   General:   Normal  Skin:   normal  HEENT:  Normal  Neck:  Supple without  Adenopathy or Thyromegaly  Lungs:   Heart:              Breasts:   Abdomen:  Pelvis:  M/S   Extremeties:  Neuro:    clear to auscultation bilaterally   Normal without murmur   Not Examined   soft, non-tender; bowel sounds normal; no masses,  no organomegaly   Exam deferred to OR  No CVAT  Warm/Dry   Normal           Labs:  Lab Results  Component  Value Date   WBC 5.6 04/08/2017   HGB 13.8 04/08/2017   HCT 40.0 04/08/2017   MCV 88.3 04/08/2017   PLT 235 04/08/2017     Imaging:  ULTRASOUND REPORT  Location: [facility] Date of Service: 04/29/17   Indications: Severe right sided pelvic pain Findings:  The uterus is anteverted and measures 8.2 x 4.8 x 4.9 cm. There is an IUD seen which appears to be in the proper place within the uterus as seen with 3D transvaginal ultrasound. Echo texture is heterogenous with evidence of a focal mass. Within the uterus is a suspected fibroid measuring: Fibroid 1: 1.4 x 1.5 x 1.2 cm and is in the anterior fundus. This was seen on the prior ultrasound in April 2018.  The Endometrium measures 10.7 mm.  Right Ovary was difficult to see either transvaginally or transabdominally due to large amount of bowel activity and gas, however, on transabdominal ultrasound an area corresponding to the right ovary was seen and measured at 2.6 x 2.0 x 2.4 cm. It appeared WNL. No obvious masses or abnormalities were seen. Left Ovary was not identified today. Survey of the adnexa demonstrates no adnexal masses. There is no free fluid in the cul de sac.  Impression: 1. IUD appears to be in the proper position within the uterus. 2. Anterior fibroid. Appears stable. 3. Right ovary appears WNL.  Recommendations: 1.Clinical correlation with the patient's History and Physical Exam.   Macarthur Critchley, RDMS, RVT  Assessment:    Pelvic pain H/o ovarian cysts IUD in place      Plan:   Counseling: Procedure, risks, reasons,  benefits and complications (including injury to bowel, bladder, major blood vessel, ureter, bleeding, possibility of transfusion, infection, or fistula formation) reviewed in detail. Consent signed. Preop testing ordered. Instructions reviewed, including NPO after midnight. Surgery scheduled for 05/09/2017      Rubie Maid, MD [facility]

## 2017-05-03 NOTE — Patient Instructions (Addendum)
Diagnostic Laparoscopy  A diagnostic laparoscopy is a procedure to diagnose diseases in the abdomen. During the procedure, a thin, lighted, pencil-sized instrument called a laparoscope is inserted into the abdomen through an incision. The laparoscope allows your health care provider to look at the organs inside your body.  Tell a health care provider about:   Any allergies you have.   All medicines you are taking, including vitamins, herbs, eye drops, creams, and over-the-counter medicines.   Any problems you or family members have had with anesthetic medicines.   Any blood disorders you have.   Any surgeries you have had.   Any medical conditions you have.  What are the risks?  Generally, this is a safe procedure. However, problems can occur, which may include:   Infection.   Bleeding.   Damage to other organs.   Allergic reaction to the anesthetics used during the procedure.    What happens before the procedure?   Do not eat or drink anything after midnight on the night before the procedure or as directed by your health care provider.   Ask your health care provider about:  ? Changing or stopping your regular medicines.  ? Taking medicines such as aspirin and ibuprofen. These medicines can thin your blood. Do not take these medicines before your procedure if your health care provider instructs you not to.   Plan to have someone take you home after the procedure.  What happens during the procedure?   You may be given a medicine to help you relax (sedative).   You will be given a medicine to make you sleep (general anesthetic).   Your abdomen will be inflated with a gas. This will make your organs easier to see.   Small incisions will be made in your abdomen.   A laparoscope and other small instruments will be inserted into the abdomen through the incisions.   A tissue sample may be removed from an organ in the abdomen for examination.   The instruments will be removed from the abdomen.   The  gas will be released.   The incisions will be closed with stitches (sutures).  What happens after the procedure?  Your blood pressure, heart rate, breathing rate, and blood oxygen level will be monitored often until the medicines you were given have worn off.  This information is not intended to replace advice given to you by your health care provider. Make sure you discuss any questions you have with your health care provider.  Document Released: 02/21/2001 Document Revised: 03/25/2016 Document Reviewed: 06/28/2014  Elsevier Interactive Patient Education  2018 [employer]

## 2017-05-05 ENCOUNTER — Ambulatory Visit
Admission: RE | Admit: 2017-05-05 | Discharge: 2017-05-05 | Disposition: A | Discharge status: Home / Self Care | Payer: Managed Care, Other (non HMO) | Source: Ambulatory Visit | Attending: Obstetrics and Gynecology | Admitting: Obstetrics and Gynecology

## 2017-05-05 ENCOUNTER — Encounter: Payer: Self-pay | Admitting: *Deleted

## 2017-05-05 ENCOUNTER — Ambulatory Visit: Payer: Managed Care, Other (non HMO) | Admitting: Anesthesiology

## 2017-05-05 ENCOUNTER — Encounter
Admission: RE | Disposition: A | Discharge status: Home / Self Care | Payer: Self-pay | Source: Ambulatory Visit | Attending: Obstetrics and Gynecology

## 2017-05-05 DIAGNOSIS — N83201 Unspecified ovarian cyst, right side: Secondary | ICD-10-CM | POA: Insufficient documentation

## 2017-05-05 DIAGNOSIS — R102 Pelvic and perineal pain: Secondary | ICD-10-CM | POA: Insufficient documentation

## 2017-05-05 DIAGNOSIS — Z975 Presence of (intrauterine) contraceptive device: Secondary | ICD-10-CM | POA: Insufficient documentation

## 2017-05-05 HISTORY — PX: LAPAROSCOPIC SALPINGO OOPHERECTOMY: SHX5927

## 2017-05-05 HISTORY — PX: IUD REMOVAL: SHX5392

## 2017-05-05 LAB — TYPE AND SCREEN
ABO/RH(D): O POS
Antibody Screen: NEGATIVE

## 2017-05-05 LAB — CBC
HEMATOCRIT: 43.6 % (ref 35.0–47.0)
Hemoglobin: 15 g/dL (ref 12.0–16.0)
MCH: 30 pg (ref 26.0–34.0)
MCHC: 34.4 g/dL (ref 32.0–36.0)
MCV: 87.1 fL (ref 80.0–100.0)
PLATELETS: 250 10*3/uL (ref 150–440)
RBC: 5.01 MIL/uL (ref 3.80–5.20)
RDW: 13.3 % (ref 11.5–14.5)
WBC: 5.9 10*3/uL (ref 3.6–11.0)

## 2017-05-05 LAB — POCT PREGNANCY, URINE: Preg Test, Ur: NEGATIVE

## 2017-05-05 LAB — ABO/RH: ABO/RH(D): O POS

## 2017-05-05 SURGERY — SALPINGO-OOPHORECTOMY, LAPAROSCOPIC
Anesthesia: General

## 2017-05-05 MED ORDER — HYDROMORPHONE HCL 1 MG/ML IJ SOLN
INTRAMUSCULAR | Status: DC | PRN
  Administered 2017-05-05 (×2): .6 mg via INTRAVENOUS

## 2017-05-05 MED ORDER — HYDROCODONE-ACETAMINOPHEN 5-325 MG PO TABS: 1.0000 | ORAL_TABLET | ORAL | 0 refills | Status: DC | PRN

## 2017-05-05 MED ORDER — FENTANYL CITRATE (PF) 100 MCG/2ML IJ SOLN
INTRAMUSCULAR | Status: AC
  Administered 2017-05-05: 25 ug via INTRAVENOUS
  Filled 2017-05-05: qty 2

## 2017-05-05 MED ORDER — HYDROMORPHONE HCL 1 MG/ML IJ SOLN
0.2500 mg | INTRAMUSCULAR | Status: DC | PRN
  Administered 2017-05-05 (×2): 0.5 mg via INTRAVENOUS

## 2017-05-05 MED ORDER — BUPIVACAINE-EPINEPHRINE (PF) 0.5% -1:200000 IJ SOLN
INTRAMUSCULAR | Status: AC
  Filled 2017-05-05: qty 30

## 2017-05-05 MED ORDER — HYDROMORPHONE HCL 1 MG/ML IJ SOLN
INTRAMUSCULAR | Status: AC
  Filled 2017-05-05: qty 1

## 2017-05-05 MED ORDER — BUPIVACAINE HCL 0.5 % IJ SOLN
INTRAMUSCULAR | Status: DC | PRN
  Administered 2017-05-05: 9 mL

## 2017-05-05 MED ORDER — MIDAZOLAM HCL 2 MG/2ML IJ SOLN
INTRAMUSCULAR | Status: AC
Start: 2017-05-05 — End: 2017-05-05
  Filled 2017-05-05: qty 2

## 2017-05-05 MED ORDER — SCOPOLAMINE 1 MG/3DAYS TD PT72
1.0000 | MEDICATED_PATCH | Freq: Once | TRANSDERMAL | Status: DC
  Administered 2017-05-05: 1.5 mg via TRANSDERMAL

## 2017-05-05 MED ORDER — FENTANYL CITRATE (PF) 100 MCG/2ML IJ SOLN
INTRAMUSCULAR | Status: DC | PRN
  Administered 2017-05-05: 50 ug via INTRAVENOUS

## 2017-05-05 MED ORDER — ONDANSETRON HCL 4 MG/2ML IJ SOLN
INTRAMUSCULAR | Status: AC
  Filled 2017-05-05: qty 2

## 2017-05-05 MED ORDER — SUGAMMADEX SODIUM 200 MG/2ML IV SOLN
INTRAVENOUS | Status: DC | PRN
  Administered 2017-05-05: 200 mg via INTRAVENOUS

## 2017-05-05 MED ORDER — HYDROCODONE-ACETAMINOPHEN 5-325 MG PO TABS
ORAL_TABLET | ORAL | Status: DC
Start: 2017-05-05 — End: 2017-05-05
  Filled 2017-05-05: qty 1

## 2017-05-05 MED ORDER — PROPOFOL 10 MG/ML IV BOLUS
INTRAVENOUS | Status: DC | PRN
  Administered 2017-05-05: 200 mg via INTRAVENOUS

## 2017-05-05 MED ORDER — FENTANYL CITRATE (PF) 100 MCG/2ML IJ SOLN
25.0000 ug | INTRAMUSCULAR | Status: AC | PRN
  Administered 2017-05-05 (×6): 25 ug via INTRAVENOUS

## 2017-05-05 MED ORDER — HYDROCODONE-ACETAMINOPHEN 5-325 MG PO TABS
1.0000 | ORAL_TABLET | ORAL | Status: DC | PRN
  Administered 2017-05-05: 1 via ORAL

## 2017-05-05 MED ORDER — LIDOCAINE HCL (CARDIAC) 20 MG/ML IV SOLN
INTRAVENOUS | Status: DC | PRN
Start: 2017-05-05 — End: 2017-05-06
  Administered 2017-05-05: 100 mg via INTRAVENOUS

## 2017-05-05 MED ORDER — LACTATED RINGERS IV SOLN: INTRAVENOUS | Status: DC

## 2017-05-05 MED ORDER — ONDANSETRON HCL 4 MG/2ML IJ SOLN
INTRAMUSCULAR | Status: DC | PRN
  Administered 2017-05-05: 4 mg via INTRAVENOUS

## 2017-05-05 MED ORDER — PROMETHAZINE HCL 25 MG/ML IJ SOLN: 6.2500 mg | INTRAMUSCULAR | Status: DC | PRN

## 2017-05-05 MED ORDER — HYDROMORPHONE HCL 1 MG/ML IJ SOLN
INTRAMUSCULAR | Status: AC
  Administered 2017-05-05: 0.5 mg via INTRAVENOUS
  Filled 2017-05-05: qty 1

## 2017-05-05 MED ORDER — LACTATED RINGERS IV SOLN
INTRAVENOUS | Status: DC
  Administered 2017-05-05 (×2): via INTRAVENOUS

## 2017-05-05 MED ORDER — DEXAMETHASONE SODIUM PHOSPHATE 10 MG/ML IJ SOLN
INTRAMUSCULAR | Status: DC | PRN
  Administered 2017-05-05: 10 mg via INTRAVENOUS

## 2017-05-05 MED ORDER — SCOPOLAMINE 1 MG/3DAYS TD PT72
MEDICATED_PATCH | TRANSDERMAL | Status: AC
  Administered 2017-05-05: 1.5 mg via TRANSDERMAL
  Filled 2017-05-05: qty 1

## 2017-05-05 MED ORDER — FENTANYL CITRATE (PF) 100 MCG/2ML IJ SOLN
INTRAMUSCULAR | Status: AC
  Filled 2017-05-05: qty 2

## 2017-05-05 MED ORDER — ROCURONIUM BROMIDE 100 MG/10ML IV SOLN
INTRAVENOUS | Status: DC | PRN
  Administered 2017-05-05: 10 mg via INTRAVENOUS

## 2017-05-05 MED ORDER — KETOROLAC TROMETHAMINE 30 MG/ML IJ SOLN
INTRAMUSCULAR | Status: DC | PRN
  Administered 2017-05-05: 30 mg via INTRAVENOUS

## 2017-05-05 SURGICAL SUPPLY — 48 items
ANCHOR TIS RET SYS 235ML (MISCELLANEOUS) ×8 IMPLANT
BLADE SURG SZ11 CARB STEEL (BLADE) ×4 IMPLANT
CANISTER SUCT 1200ML W/VALVE (MISCELLANEOUS) ×4 IMPLANT
CATH ROBINSON RED A/P 16FR (CATHETERS) ×4 IMPLANT
CHLORAPREP W/TINT 26ML (MISCELLANEOUS) ×4 IMPLANT
CORD MONOPOLAR M/FML 12FT (MISCELLANEOUS) IMPLANT
CUP MEDICINE 2OZ PLAST GRAD ST (MISCELLANEOUS) ×4 IMPLANT
DERMABOND ADVANCED (GAUZE/BANDAGES/DRESSINGS) ×2
DERMABOND ADVANCED .7 DNX12 (GAUZE/BANDAGES/DRESSINGS) ×2 IMPLANT
DEVICE PMI PUNCTURE CLOSURE (MISCELLANEOUS) ×4 IMPLANT
DRAPE UNDER BUTTOCK W/FLU (DRAPES) ×4 IMPLANT
DRSG TELFA 3X8 NADH (GAUZE/BANDAGES/DRESSINGS) ×4 IMPLANT
GLOVE BIO SURGEON STRL SZ 6.5 (GLOVE) ×9 IMPLANT
GLOVE BIO SURGEON STRL SZ8 (GLOVE) ×16 IMPLANT
GLOVE BIO SURGEONS STRL SZ 6.5 (GLOVE) ×3
GLOVE INDICATOR 7.0 STRL GRN (GLOVE) ×16 IMPLANT
GLOVE INDICATOR 8.0 STRL GRN (GLOVE) ×16 IMPLANT
GOWN STRL REUS W/ TWL LRG LVL3 (GOWN DISPOSABLE) ×4 IMPLANT
GOWN STRL REUS W/TWL LRG LVL3 (GOWN DISPOSABLE) ×4
GOWN STRL REUS W/TWL XL LVL3 (GOWN DISPOSABLE) ×4 IMPLANT
IRRIGATION STRYKERFLOW (MISCELLANEOUS) IMPLANT
IRRIGATOR STRYKERFLOW (MISCELLANEOUS)
IV LACTATED RINGERS 1000ML (IV SOLUTION) ×4 IMPLANT
KIT PINK PAD W/HEAD ARE REST (MISCELLANEOUS) ×4
KIT PINK PAD W/HEAD ARM REST (MISCELLANEOUS) ×2 IMPLANT
KIT RM TURNOVER CYSTO AR (KITS) ×4 IMPLANT
NS IRRIG 500ML POUR BTL (IV SOLUTION) ×4 IMPLANT
PACK DNC HYST (MISCELLANEOUS) ×4 IMPLANT
PACK GYN LAPAROSCOPIC (MISCELLANEOUS) ×4 IMPLANT
PAD OB MATERNITY 4.3X12.25 (PERSONAL CARE ITEMS) ×4 IMPLANT
PAD PREP 24X41 OB/GYN DISP (PERSONAL CARE ITEMS) ×4 IMPLANT
POUCH ENDO CATCH 10MM SPEC (MISCELLANEOUS) IMPLANT
SCISSORS METZENBAUM CVD 33 (INSTRUMENTS) IMPLANT
SHEARS HARMONIC ACE PLUS 36CM (ENDOMECHANICALS) IMPLANT
SLEEVE ENDOPATH XCEL 5M (ENDOMECHANICALS) ×4 IMPLANT
SOL PREP PVP 2OZ (MISCELLANEOUS) ×4
SOLUTION PREP PVP 2OZ (MISCELLANEOUS) ×2 IMPLANT
SPONGE XRAY 4X4 16PLY STRL (MISCELLANEOUS) ×4 IMPLANT
SUT VIC AB 3-0 SH 27 (SUTURE) ×2
SUT VIC AB 3-0 SH 27X BRD (SUTURE) ×2 IMPLANT
SUT VIC AB 4-0 FS2 27 (SUTURE) ×4 IMPLANT
SUT VICRYL 0 AB UR-6 (SUTURE) IMPLANT
SUT VICRYL 0 UR6 27IN ABS (SUTURE) ×4 IMPLANT
TOWEL OR 17X26 4PK STRL BLUE (TOWEL DISPOSABLE) ×4 IMPLANT
TROCAR ENDO BLADELESS 11MM (ENDOMECHANICALS) ×4 IMPLANT
TROCAR XCEL NON-BLD 5MMX100MML (ENDOMECHANICALS) ×4 IMPLANT
TROCAR XCEL UNIV SLVE 11M 100M (ENDOMECHANICALS) IMPLANT
TUBING INSUFFLATOR HI FLOW (MISCELLANEOUS) ×4 IMPLANT

## 2017-05-05 NOTE — Anesthesia Preprocedure Evaluation (Signed)
Anesthesia Evaluation  Patient identified by MRN, date of birth, ID band Patient awake    Reviewed: Allergy & Precautions, NPO status , Patient's Chart, lab work & pertinent test results, reviewed documented beta blocker date and time   History of Anesthesia Complications (+) PONV and history of anesthetic complications  Airway Mallampati: III  TM Distance: >3 FB     Dental  (+) Chipped   Pulmonary neg shortness of breath, asthma , neg sleep apnea, neg COPD, neg recent URI,           Cardiovascular Exercise Tolerance: Good hypertension, (-) angina(-) CAD, (-) Past MI, (-) Cardiac Stents and (-) CABG (-) dysrhythmias (-) Valvular Problems/Murmurs     Neuro/Psych  Headaches, neg Seizures negative psych ROS   GI/Hepatic negative GI ROS, Neg liver ROS,   Endo/Other  negative endocrine ROS  Renal/GU negative Renal ROS  negative genitourinary   Musculoskeletal   Abdominal   Peds  Hematology negative hematology ROS (+)   Anesthesia Other Findings Past Medical History: No date: Allergy No date: Gestational diabetes     Comment: patient denies No date: H/O bronchitis No date: Headache     Comment: migraine No date: History of ovarian cyst No date: Hypertension     Comment: pre-eclampsia with first child No date: Increased pressure in the eye, bilateral   Reproductive/Obstetrics negative OB ROS                             Anesthesia Physical  Anesthesia Plan  ASA: III  Anesthesia Plan: General ETT   Post-op Pain Management:    Induction: Intravenous  PONV Risk Score and Plan: 4 or greater and Ondansetron, Dexamethasone, Propofol, Midazolam, Scopolamine patch - Pre-op and Treatment may vary due to age  Airway Management Planned: Oral ETT  Additional Equipment:   Intra-op Plan:   Post-operative Plan:   Informed Consent: I have reviewed the patients History and Physical, chart,  labs and discussed the procedure including the risks, benefits and alternatives for the proposed anesthesia with the patient or authorized representative who has indicated his/her understanding and acceptance.     Plan Discussed with: CRNA and Anesthesiologist  Anesthesia Plan Comments:         Anesthesia Quick Evaluation

## 2017-05-05 NOTE — Anesthesia Post-op Follow-up Note (Cosign Needed)
Anesthesia QCDR form completed.        

## 2017-05-05 NOTE — Op Note (Addendum)
Procedure(s): LAPAROSCOPIC BILATERAL SALPINGO OOPHORECTOMY INTRAUTERINE DEVICE (IUD) REMOVAL Procedure Note  Yvonne [name] female 51 y.o. 05/05/2017  Indications: The patient is a 51 y.o. G2P2 female with persistent right-sided pelvic pain, history of ovarian cyst, IUD in place desiring removal  Pre-operative Diagnosis:  Persistent right-sided pelvic pain, history of ovarian cyst, IUD in place desiring removal  Post-operative Diagnosis: Same, with 3-4 cm right simple cyst  Surgeon: Rubie Maid, MD  Assistants: Rojelio Brenner, NP-Student  Anesthesia: General endotracheal anesthesia  Procedure Details: The patient was seen in the Holding Room. The risks, benefits, complications, treatment options, and expected outcomes were discussed with the patient.  The patient concurred with the proposed plan, giving informed consent.  The site of surgery properly noted/marked. The patient was taken to the Operating Room, identified as Yvonne [name] and the procedure verified as Procedure(s) (LRB): LAPAROSCOPIC BILATERAL SALPINGO OOPHORECTOMY (Bilateral) INTRAUTERINE DEVICE (IUD) REMOVAL (N/A). A Time Out was held and the above information confirmed.  She was then placed under general anesthesia without difficulty. She was placed in the dorsal lithotomy position, and was prepped and draped in a sterile manner.  A straight catheterization was performed. A sterile speculum was inserted into the vagina and the cervix was grasped at the anterior lip using a single-toothed tenaculum.  The uterus was sounded to 9 cm, and the IUD strings were visualized and grasped with a ring forceps and removed from the uterus.  Next a Hulka clamp was placed for uterine manipulation.  The speculum and tenaculum were then removed. After an adequate timeout was performed, attention was turned to the abdomen where an umbilical incision was made with the scalpel.  The Optiview 5-mm trocar and sleeve were then advanced without  difficulty with the laparoscope under direct visualization into the abdomen.  The abdomen was then insufflated with carbon dioxide gas and adequate pneumoperitoneum was obtained. A 5-mm left lower quadrant port and an 11-mm right lower quadrant port were then placed under direct visualization.  A survey of the patient's pelvis and abdomen revealed the findings as above.  The ureters were visualized bilaterally with peristalsis noted. On the left side, the infundibulopelvic ligament was clamped and transected with the Harmonic device. This procedure was then repeated on the right side.  An Endocatch bag was then inserted into the 11 mm trochar and the left and right ovaries and tubes were removed separately.   The skin and fascial incision were extended to be able to remove the specimen. The fascia of 11-mm inicision was then closed using the Carter-Thomason device with a 0-Vicryl.  Next the air was allowed to be expressed from the abdomen.  The trochars were then removed under direct visualization.     The subcutaneous fat layer was approximated using 3-0 Vicryl in a running fashion.  The skin was approximated using 4-0 Monocryll.   All skin incisions were injected with 0.5% Sensorcaine with epinephrine prior to closure (approximately 9 ml total).  The 11-mm port site skin incision was then closed with 4-0 Monocryl subcuticular stitches. The patient tolerated the procedures well.  All instruments, needles, and sponge counts were correct x 2. The patient was taken to the recovery room awake, extubated and in stable condition.   Findings: The uterus was sounded to 9  Fallopian tubes and ovaries appeared normal.   Estimated Blood Loss:  minimal      Drains: straight catheterization prior to procedure with  100 ml of clear urine  Total IV Fluids:  1300  ml  Specimens: None         Implants: None         Complications:  None; patient tolerated the procedure well.         Disposition: PACU -  hemodynamically stable.         Condition: stable   Rubie Maid, MD [facility]

## 2017-05-05 NOTE — H&P (Signed)
UPDATE TO PREVIOUS HISTORY AND PHYSICAL  The patient has been seen and examined.  H&P is up to date, no changes noted.  Yvonne [name] is a 51 y.o. [phone] female scheduled for laparoscopic BSO with MIrena IUD removal.  Indications for surgery include chronic onset pelvic pain post-op (s/p laparoscopy with biopsies, Hysteroscopy D&C with polypectomy and IUD placement ~ 3 weeks ago).  Patient also with a h/o ovarian cysts. Desires IUD removal also.Patient can proceed to the OR for scheduled procedure.   Rubie Maid, MD [facility]

## 2017-05-05 NOTE — Anesthesia Procedure Notes (Signed)
Procedure Name: Intubation Date/Time: 05/05/2017 12:25 PM Performed by: [facility] Pre-anesthesia Checklist: Patient identified Patient Re-evaluated:Patient Re-evaluated prior to inductionOxygen Delivery Method: Circle system utilized Preoxygenation: Pre-oxygenation with 100% oxygen Intubation Type: IV induction Ventilation: Mask ventilation without difficulty Laryngoscope Size: Mac and 3 Grade View: Grade I Tube type: Oral Tube size: 7.0 mm Number of attempts: 1 Airway Equipment and Method: Stylet Placement Confirmation: ETT inserted through vocal cords under direct vision,  positive ETCO2 and breath sounds checked- equal and bilateral Secured at: 21 cm Tube secured with: Tape Dental Injury: Teeth and Oropharynx as per pre-operative assessment

## 2017-05-05 NOTE — Discharge Instructions (Signed)
Diagnostic Laparoscopy, Care After Refer to this sheet in the next few weeks. These instructions provide you with information about caring for yourself after your procedure. Your health care provider may also give you more specific instructions. Your treatment has been planned according to current medical practices, but problems sometimes occur. Call your health care provider if you have any problems or questions after your procedure. What can I expect after the procedure? After your procedure, it is common to have mild discomfort in the throat and abdomen. Follow these instructions at home:  Take over-the-counter and prescription medicines only as told by your health care provider.  Do not drive for 24 hours if you received a sedative.  Return to your normal activities as told by your health care provider.  Do not take baths, swim, or use a hot tub until your health care provider approves. You may shower.  Follow instructions from your health care provider about how to take care of your incision. Make sure you: ? Wash your hands with soap and water before you change your bandage (dressing). If soap and water are not available, use hand sanitizer. ? Change your dressing as told by your health care provider. ? Leave stitches (sutures), skin glue, or adhesive strips in place. These skin closures may need to stay in place for 2 weeks or longer. If adhesive strip edges start to loosen and curl up, you may trim the loose edges. Do not remove adhesive strips completely unless your health care provider tells you to do that.  Check your incision area every day for signs of infection. Check for: ? More redness, swelling, or pain. ? More fluid or blood. ? Warmth. ? Pus or a bad smell.  It is your responsibility to get the results of your procedure. Ask your health care provider or the department performing the procedure when your results will be ready. Contact a health care provider if:  There is  new pain in your shoulders.  You feel light-headed or faint.  You are unable to pass gas or unable to have a bowel movement.  You feel nauseous or you vomit.  You develop a rash.  You have more redness, swelling, or pain around your incision.  You have more fluid or blood coming from your incision.  Your incision feels warm to the touch.  You have pus or a bad smell coming from your incision.  You have a fever or chills. Get help right away if:  Your pain is getting worse.  You have ongoing vomiting.  The edges of your incision open up.  You have trouble breathing.  You have chest pain. This information is not intended to replace advice given to you by your health care provider. Make sure you discuss any questions you have with your health care provider. Document Released: 10/27/2015 Document Revised: 04/22/2016 Document Reviewed: 07/29/2015 Elsevier Interactive Patient Education  2018 [place].    Bilateral Salpingo-Oophorectomy, Care After This sheet gives you information about how to care for yourself after your procedure. Your health care provider may also give you more specific instructions. If you have problems or questions, contact your health care provider. What can I expect after the procedure? After the procedure, it is common to have:  Abdominal pain.  Some occasional vaginal bleeding (spotting).  Tiredness.  Symptoms of menopause, such as hot flashes, night sweats, or mood swings.  Follow these instructions at home: Incision care  Keep your incision area and your bandage (dressing) clean  and dry.  Follow instructions from your health care provider about how to take care of your incision. Make sure you: ? Wash your hands with soap and water before you change your dressing. If soap and water are not available, use hand sanitizer. ? Change your dressing as told by your health care provider. ? Leave stitches (sutures), staples, skin glue, or  adhesive strips in place. These skin closures may need to stay in place for 2 weeks or longer. If adhesive strip edges start to loosen and curl up, you may trim the loose edges. Do not remove adhesive strips completely unless your health care provider tells you to do that.  Check your incision area every day for signs of infection. Check for: ? Redness, swelling, or pain. ? Fluid or blood. ? Warmth. ? Pus or a bad smell. Activity  Do not drive or use heavy machinery while taking prescription pain medicine.  Do not drive for 24 hours if you received a medicine to help you relax (sedative) during your procedure.  Take frequent, short walks throughout the day. Rest when you get tired. Ask your health care provider what activities are safe for you.  Avoid activity that requires great effort. Also, avoid heavy lifting. Do not lift anything that is heavier than 10 lbs. (4.5 kg), or the limit that your health care provider tells you, until he or she says that it is safe to do so.  Do not douche, use tampons, or have sex until your health care provider approves. General instructions  To prevent or treat constipation while you are taking prescription pain medicine, your health care provider may recommend that you: ? Drink enough fluid to keep your urine clear or pale yellow. ? Take over-the-counter or prescription medicines. ? Eat foods that are high in fiber, such as fresh fruits and vegetables, whole grains, and beans. ? Limit foods that are high in fat and processed sugars, such as fried and sweet foods.  Take over-the-counter and prescription medicines only as told by your health care provider.  Do not take baths, swim, or use a hot tub until your health care provider approves. Ask your health care provider if you can take showers. You may only be allowed to take sponge baths for bathing.  Wear compression stockings as told by your health care provider. These stockings help to prevent blood  clots and reduce swelling in your legs.  Keep all follow-up visits as told by your health care provider. This is important. Contact a health care provider if:  You have pain when you urinate.  You have pus or a bad smelling discharge coming from your vagina.  You have redness, swelling, or pain around your incision.  You have fluid or blood coming from your incision.  Your incision feels warm to the touch.  You have pus or a bad smell coming from your incision.  You have a fever.  Your incision starts to break open.  You have pain in the abdomen, and it gets worse or does not get better when you take medicine.  You develop a rash.  You develop nausea and vomiting.  You feel lightheaded. Get help right away if:  You develop pain in your chest or leg.  You become short of breath.  You faint.  You have increased bleeding from your vagina. Summary  After the procedure, it is common to have pain, bleeding in the vagina, and symptoms of menopause.  Follow instructions from your health care  provider about how to take care of your incision.  Follow instructions from your health care provider about activities and restrictions.  Check your incision every day for signs of infection and report any symptoms to your health care provider. This information is not intended to replace advice given to you by your health care provider. Make sure you discuss any questions you have with your health care provider. Document Released: 11/15/2005 Document Revised: 12/20/2016 Document Reviewed: 12/20/2016 Elsevier Interactive Patient Education  2018 [place] Anesthesia, Adult, Care After These instructions provide you with information about caring for yourself after your procedure. Your health care provider may also give you more specific instructions. Your treatment has been planned according to current medical practices, but problems sometimes occur. Call your health care  provider if you have any problems or questions after your procedure. What can I expect after the procedure? After the procedure, it is common to have:  Vomiting.  A sore throat.  Mental slowness.  It is common to feel:  Nauseous.  Cold or shivery.  Sleepy.  Tired.  Sore or achy, even in parts of your body where you did not have surgery.  Follow these instructions at home: For at least 24 hours after the procedure:  Do not: ? Participate in activities where you could fall or become injured. ? Drive. ? Use heavy machinery. ? Drink alcohol. ? Take sleeping pills or medicines that cause drowsiness. ? Make important decisions or sign legal documents. ? Take care of children on your own.  Rest. Eating and drinking  If you vomit, drink water, juice, or soup when you can drink without vomiting.  Drink enough fluid to keep your urine clear or pale yellow.  Make sure you have little or no nausea before eating solid foods.  Follow the diet recommended by your health care provider. General instructions  Have a responsible adult stay with you until you are awake and alert.  Return to your normal activities as told by your health care provider. Ask your health care provider what activities are safe for you.  Take over-the-counter and prescription medicines only as told by your health care provider.  If you smoke, do not smoke without supervision.  Keep all follow-up visits as told by your health care provider. This is important. Contact a health care provider if:  You continue to have nausea or vomiting at home, and medicines are not helpful.  You cannot drink fluids or start eating again.  You cannot urinate after 8-12 hours.  You develop a skin rash.  You have fever.  You have increasing redness at the site of your procedure. Get help right away if:  You have difficulty breathing.  You have chest pain.  You have unexpected bleeding.  You feel that you  are having a life-threatening or urgent problem. This information is not intended to replace advice given to you by your health care provider. Make sure you discuss any questions you have with your health care provider. Document Released: 02/21/2001 Document Revised: 04/19/2016 Document Reviewed: 10/30/2015 Elsevier Interactive Patient Education  [employer].

## 2017-05-05 NOTE — Transfer of Care (Signed)
Immediate Anesthesia Transfer of Care Note  Patient: Yvonne [name]  Procedure(s) Performed: Procedure(s): LAPAROSCOPIC BILATERAL SALPINGO OOPHORECTOMY (Bilateral) INTRAUTERINE DEVICE (IUD) REMOVAL (N/A)  Patient Location: PACU  Anesthesia Type:General  Level of Consciousness: awake, alert  and oriented  Airway & Oxygen Therapy: Patient Spontanous Breathing and Patient connected to face mask oxygen  Post-op Assessment: Report given to RN and Post -op Vital signs reviewed and stable  Post vital signs: Reviewed and stable  Last Vitals:  Vitals:   05/05/17 1415 05/05/17 1421  BP:  (!) 95/46  Pulse: (!) 55 (!) 56  Resp: 20 20  Temp:      Last Pain:  Vitals:   05/05/17 1421  TempSrc:   PainSc: 8       Patients Stated Pain Goal: 2 (33/54/56 2563)  Complications: No apparent anesthesia complications

## 2017-05-06 ENCOUNTER — Encounter: Payer: Self-pay | Admitting: Obstetrics and Gynecology

## 2017-05-06 LAB — SURGICAL PATHOLOGY

## 2017-05-06 NOTE — Anesthesia Postprocedure Evaluation (Signed)
Anesthesia Post Note  Patient: Yvonne [name]  Procedure(s) Performed: Procedure(s) (LRB): LAPAROSCOPIC BILATERAL SALPINGO OOPHORECTOMY (Bilateral) INTRAUTERINE DEVICE (IUD) REMOVAL (N/A)  Patient location during evaluation: PACU Anesthesia Type: General Level of consciousness: awake and alert Pain management: pain level controlled Vital Signs Assessment: post-procedure vital signs reviewed and stable Respiratory status: spontaneous breathing, nonlabored ventilation, respiratory function stable and patient connected to nasal cannula oxygen Cardiovascular status: blood pressure returned to baseline and stable Postop Assessment: no signs of nausea or vomiting Anesthetic complications: no     Last Vitals:  Vitals:   05/05/17 1548 05/05/17 1605  BP: 112/69 119/65  Pulse: 72 69  Resp: 16 16  Temp:      Last Pain:  Vitals:   05/06/17 0757  TempSrc:   PainSc: 0-No pain                 Martha Clan

## 2017-05-09 ENCOUNTER — Telehealth: Payer: Self-pay | Admitting: Obstetrics and Gynecology

## 2017-05-09 ENCOUNTER — Encounter: Payer: Self-pay | Admitting: Obstetrics and Gynecology

## 2017-05-09 NOTE — Telephone Encounter (Signed)
Patient with c/o right sided spasms that started yesterday and are worse today She has tried ibuprofen and tylenol with no relief  Please call

## 2017-05-10 ENCOUNTER — Telehealth: Payer: Self-pay | Admitting: Obstetrics and Gynecology

## 2017-05-10 NOTE — Telephone Encounter (Signed)
Called pt's husband, informed him of Dr.Cherry's message, he gave verbal understanding.

## 2017-05-10 NOTE — Telephone Encounter (Signed)
Yes, the internal stictches can pull and cause an indention. We sutured that side tightly because of the risk of herniation through the incision, so as it heals, it will slowly release the tissue and the dent will resolve.  It sometimes can be uncomfortable. She does have stronger pain medication to take as needed.

## 2017-05-10 NOTE — Telephone Encounter (Signed)
Husband called to ask if the internal stitches could be pulling and causing an indented spot on her right side - Rickey called him at work in a lot of pain  Please call

## 2017-05-13 ENCOUNTER — Ambulatory Visit (INDEPENDENT_AMBULATORY_CARE_PROVIDER_SITE_OTHER): Payer: Managed Care, Other (non HMO) | Admitting: Obstetrics and Gynecology

## 2017-05-13 VITALS — BP 168/102 | HR 72 | Ht 62.0 in | Wt 201.7 lb

## 2017-05-13 DIAGNOSIS — Z90722 Acquired absence of ovaries, bilateral: Secondary | ICD-10-CM

## 2017-05-13 DIAGNOSIS — I159 Secondary hypertension, unspecified: Secondary | ICD-10-CM

## 2017-05-13 DIAGNOSIS — E8941 Symptomatic postprocedural ovarian failure: Secondary | ICD-10-CM

## 2017-05-13 DIAGNOSIS — Z9889 Other specified postprocedural states: Secondary | ICD-10-CM

## 2017-05-13 NOTE — Progress Notes (Signed)
    OBSTETRICS/GYNECOLOGY POST-OPERATIVE CLINIC VISIT  Subjective:     Yvonne [name] is a 51 y.o. female who presents to the clinic 1 weeks status post laparoscopic BSO and Mirena IUD removal for pelvic pain and history of recurrent ovarian cysts. Eating a regular diet without difficulty. Bowel movements are normal. Pain is controlled with current analgesics. Medications being used: acetaminophen, ibuprofen (OTC) and narcotic analgesics including oxycodone/acetaminophen (Percocet, Tylox) and tramadol (Ultram).  Patient notes that over the past few days the pain has steadily been decreasing.  Starting to feel "more like herself".   Is noting some vaginal spotting.   The following portions of the patient's history were reviewed and updated as appropriate: allergies, current medications, past family history, past medical history, past social history, past surgical history and problem list.  Review of Systems A comprehensive review of systems was negative except for: Genitourinary: positive for hot flashes and night sweats and mood changes    Objective:    BP (!) 168/102 (BP Location: Left Arm, Patient Position: Sitting, Cuff Size: Normal)   Pulse 72   Ht 5\' 2"  (1.575 m)   Wt 201 lb 11.2 oz (91.5 kg)   BMI 36.89 kg/m  General:  alert and no distress  Abdomen: soft, bowel sounds active, non-tender  Incision:   healing well, no drainage, no erythema, no hernia, no seroma, no swelling, no dehiscence, incision well approximated    Pathology:  A. OVARIES AND FALLOPIAN TUBES, BILATERAL; SALPINGO-OOPHORECTOMY:  - UNILATERAL MATURE CYSTIC TERATOMA (DERMOID CYST).  - BILATERAL CYSTIC OVARIAN FOLLICLES WITH BACKGROUND OF AGE APPROPRIATE  CHANGE.  - BILATERAL BENIGN PARATUBAL CYSTS.  - ONE FALLOPIAN TUBE WITH ISCHEMIA AND REACTIVE CHANGES.  - NEGATIVE FOR ATYPIA AND MALIGNANCY.   Assessment:    Doing well postoperatively.  Surgical menopause Elevated BP   Plan:   1. Continue any current  medications. Patient also given samples of Premarin 0.625 mg.  If works well, will prescribe Prempro as she still has an intact uterus.  2. Wound care discussed.  Advised that spotting may be normal for the next week or so. 3. Operative findings again reviewed. Pathology report discussed. 4. Activity restrictions: no bending, stooping, or squatting and no lifting more than 10-15 pounds, and pelvic rest x 1 week. 5. Anticipated return to work: 1-2 weeks. 6. Elevated BP again today, likely with newly diagnosed HTN.  Will reassess at f/u once patient's pain is a little better controlled. Advised patient to f/u with PCP for further management.  7. Follow up: 1 week for final post-op check.     Rubie Maid, MD [facility]

## 2017-05-17 ENCOUNTER — Encounter: Payer: Managed Care, Other (non HMO) | Admitting: Obstetrics and Gynecology

## 2017-05-18 ENCOUNTER — Encounter: Payer: Managed Care, Other (non HMO) | Admitting: Obstetrics and Gynecology

## 2017-05-19 ENCOUNTER — Encounter: Payer: Self-pay | Admitting: Obstetrics and Gynecology

## 2017-05-19 ENCOUNTER — Ambulatory Visit (INDEPENDENT_AMBULATORY_CARE_PROVIDER_SITE_OTHER): Payer: Managed Care, Other (non HMO) | Admitting: Obstetrics and Gynecology

## 2017-05-19 ENCOUNTER — Telehealth: Payer: Self-pay | Admitting: Obstetrics and Gynecology

## 2017-05-19 VITALS — BP 152/97 | HR 77 | Ht 62.0 in | Wt 203.8 lb

## 2017-05-19 DIAGNOSIS — E8941 Symptomatic postprocedural ovarian failure: Secondary | ICD-10-CM

## 2017-05-19 DIAGNOSIS — Z4889 Encounter for other specified surgical aftercare: Secondary | ICD-10-CM

## 2017-05-19 DIAGNOSIS — I10 Essential (primary) hypertension: Secondary | ICD-10-CM

## 2017-05-19 MED ORDER — CONJ ESTROG-MEDROXYPROGEST ACE 0.625-5 MG PO TABS: 1.0000 | ORAL_TABLET | Freq: Every day | ORAL | 11 refills | Status: DC

## 2017-05-19 NOTE — Progress Notes (Signed)
    OBSTETRICS/GYNECOLOGY POST-OPERATIVE CLINIC VISIT  Subjective:     Yvonne [name] is a 51 y.o. female who presents to the clinic 2 weeks status post laparoscopic BSO and Mirena IUD removal for pelvic pain and history of recurrent ovarian cysts. Eating a regular diet without difficulty. Bowel movements are normal. Pain is controlled with current analgesics. Medications being used: acetaminophen, ibuprofen (OTC) and narcotic analgesics including tramadol (Ultram).  Patient notes that pain has mostly resolved, but occasionally has twinges of pain here and there from right abdominal incision.  The following portions of the patient's history were reviewed and updated as appropriate: allergies, current medications, past family history, past medical history, past social history, past surgical history and problem list.  Review of Systems A comprehensive review of systems was negative except for: Genitourinary: positive for hot flashes and night sweats and mood changes    Objective:    BP (!) 152/97   Pulse 77   Ht 5\' 2"  (1.575 m)   Wt 203 lb 12.8 oz (92.4 kg)   LMP  (LMP Unknown)   BMI 37.28 kg/m  General:  alert and no distress  Abdomen: soft, bowel sounds active, non-tender  Incision:   healing well, no drainage, no erythema, no hernia, no seroma, no swelling, no dehiscence, incision well approximated    Pathology:  A. OVARIES AND FALLOPIAN TUBES, BILATERAL; SALPINGO-OOPHORECTOMY:  - UNILATERAL MATURE CYSTIC TERATOMA (DERMOID CYST).  - BILATERAL CYSTIC OVARIAN FOLLICLES WITH BACKGROUND OF AGE APPROPRIATE  CHANGE.  - BILATERAL BENIGN PARATUBAL CYSTS.  - ONE FALLOPIAN TUBE WITH ISCHEMIA AND REACTIVE CHANGES.  - NEGATIVE FOR ATYPIA AND MALIGNANCY.   Assessment:    Doing well postoperatively.  Surgical menopause with vasomotor symptms Elevated BP, likely HTN   Plan:   1. Continue any current medications. Patient notes samples of Premarin 0.625 mg worked well. Will prescribe  Prempro as she still has an intact uterus for more long-term use.  2. Activity restrictions: no bending, stooping, or squatting and no lifting more than 10-15 pounds x 1 week. 3. Anticipated return to work: now and with restrictions: no heavy lifting, bendin, squatting x 1 week, then can return to full duty.. 4. Elevated BP again today, likely with newly diagnosed HTN.  Now that pain has improved, BPs still somewhat elevated. Advised patient to f/u with PCP for further management. Can work on managing with diet and exercise in the meantime.  5. Follow up: as needed.    Rubie Maid, MD [facility]

## 2017-05-19 NOTE — Telephone Encounter (Signed)
Patient lvm wanting to speak with Dr. Marcelline Mates or Charlott Rakes, Patient discovered that prescription is $37.00 a month and wants to switch to a cheaper alternative as soon a possible, Find a cheaper alternative, or a generic version. Patient would like to be called as soon as possible, Please advise.

## 2017-05-20 NOTE — Telephone Encounter (Signed)
Called pt she states that initially when the pharmacist told her that RX would be $37 a month she was overwhelmed and wanted something different. Pt states that now in hindsight she is ok with the cost as the medication is working well.

## 2017-05-24 ENCOUNTER — Ambulatory Visit: Payer: Self-pay | Admitting: Physician Assistant

## 2017-05-24 ENCOUNTER — Encounter: Payer: Self-pay | Admitting: Physician Assistant

## 2017-05-24 VITALS — BP 130/79 | HR 93 | Temp 98.5°F | Resp 16

## 2017-05-24 DIAGNOSIS — S39012A Strain of muscle, fascia and tendon of lower back, initial encounter: Secondary | ICD-10-CM

## 2017-05-24 DIAGNOSIS — B079 Viral wart, unspecified: Secondary | ICD-10-CM

## 2017-05-24 MED ORDER — BACLOFEN 10 MG PO TABS: 10.0000 mg | ORAL_TABLET | Freq: Three times a day (TID) | ORAL | 2 refills | Status: DC

## 2017-05-24 NOTE — Progress Notes (Signed)
S: c/o area on finger, doesn't know what it is, thought it was a wart then used wart away liquid and it really burned, then used a fungal lotion, ?if its a foreign body or worm in her finger, also stepped out of her husbands truck wrong and pulled her back again  O: vitals wnl, nad, lumbar spine nontender, finger with red inflamed area around umbilical type lesion, ?if wart that is eroding from otc medication, no drainage, no pus noted, n/v intact  A: back strain, wart  P: otc wart stick with duct tape, refill on baclofen

## 2017-06-13 ENCOUNTER — Telehealth: Payer: Self-pay | Admitting: Obstetrics and Gynecology

## 2017-06-13 NOTE — Telephone Encounter (Signed)
Patient has a migraine and the excedrin is only dulling it - is there a medication that you can give her for the migraine that won't make her sleepy   Please call

## 2017-06-13 NOTE — Telephone Encounter (Signed)
Called pt no answer. LM informing pt that a migraine is something she should follow up with her PCP about. Advised pt on signs and sx that would indicate a need to go to the ER.

## 2017-06-14 ENCOUNTER — Encounter: Payer: Self-pay | Admitting: Obstetrics and Gynecology

## 2017-06-15 ENCOUNTER — Other Ambulatory Visit: Payer: Self-pay

## 2017-06-15 DIAGNOSIS — N951 Menopausal and female climacteric states: Secondary | ICD-10-CM

## 2017-06-15 MED ORDER — CONJ ESTROG-MEDROXYPROGEST ACE 0.45-1.5 MG PO TABS: 1.0000 | ORAL_TABLET | Freq: Every day | ORAL | 6 refills | Status: DC

## 2017-07-10 ENCOUNTER — Encounter: Payer: Self-pay | Admitting: Obstetrics and Gynecology

## 2017-07-11 ENCOUNTER — Other Ambulatory Visit: Payer: Self-pay | Admitting: Obstetrics and Gynecology

## 2017-07-11 MED ORDER — CONJ ESTROG-MEDROXYPROGEST ACE 0.625-5 MG PO TABS: 1.0000 | ORAL_TABLET | Freq: Every day | ORAL | 11 refills | Status: DC

## 2017-08-08 ENCOUNTER — Encounter: Payer: Self-pay | Admitting: Physician Assistant

## 2017-08-08 ENCOUNTER — Ambulatory Visit: Payer: Self-pay | Admitting: Physician Assistant

## 2017-08-08 VITALS — BP 140/99 | HR 75 | Temp 98.0°F | Resp 16

## 2017-08-08 DIAGNOSIS — G43001 Migraine without aura, not intractable, with status migrainosus: Secondary | ICD-10-CM

## 2017-08-08 MED ORDER — SUMATRIPTAN SUCCINATE 100 MG PO TABS: ORAL_TABLET | ORAL | 0 refills | Status: DC

## 2017-08-08 MED ORDER — KETOROLAC TROMETHAMINE 60 MG/2ML IM SOLN
60.0000 mg | Freq: Once | INTRAMUSCULAR | Status: AC
  Administered 2017-08-08: 60 mg via INTRAMUSCULAR

## 2017-08-08 MED ORDER — PROMETHAZINE HCL 25 MG PO TABS: 25.0000 mg | ORAL_TABLET | Freq: Three times a day (TID) | ORAL | 0 refills | Status: DC | PRN

## 2017-08-08 NOTE — Progress Notes (Signed)
S: c/o headache and sinus pressure, no fever/chills, states headache is like a migraine, + nausea without vomiting, states she went back on the estrogen and ?if that is causing the headaches, is sensitive to light and noise, hx of migraines  O: vitals wnl, nad, perrl eomi, light directed at eyes reproduces pain, ent wnl, neck supple no lymph, lungs c t a, cv rrr, cn II-XII intact  A: acute migraine  P: toradol 60mg  Im given in clinic, imitrex, phenergan, ok to give pt work note

## 2017-08-16 ENCOUNTER — Other Ambulatory Visit: Payer: Self-pay | Admitting: Emergency Medicine

## 2017-08-16 NOTE — Telephone Encounter (Signed)
Patient called to request a medication refill for her Imitrex.  She currently has four left over.

## 2017-08-17 MED ORDER — SUMATRIPTAN SUCCINATE 100 MG PO TABS: ORAL_TABLET | ORAL | 0 refills | Status: DC

## 2017-08-17 NOTE — Telephone Encounter (Signed)
Med refill for imitrex, pt states she has 4 pills left, original rx written on 08/08/17, explained to pt that if the headaches are this frequent then I would like for her to see neurology

## 2017-08-22 ENCOUNTER — Other Ambulatory Visit: Payer: Self-pay | Admitting: Physician Assistant

## 2017-10-04 ENCOUNTER — Other Ambulatory Visit: Payer: Self-pay | Admitting: Family

## 2017-10-04 ENCOUNTER — Ambulatory Visit
Admission: RE | Admit: 2017-10-04 | Discharge: 2017-10-04 | Disposition: A | Discharge status: Home / Self Care | Payer: Managed Care, Other (non HMO) | Source: Ambulatory Visit | Attending: Family | Admitting: Family

## 2017-10-04 ENCOUNTER — Ambulatory Visit
Admission: RE | Admit: 2017-10-04 | Discharge: 2017-10-04 | Disposition: A | Discharge status: Home / Self Care | Payer: Worker's Compensation | Source: Ambulatory Visit | Attending: Family | Admitting: Family

## 2017-10-04 DIAGNOSIS — M1711 Unilateral primary osteoarthritis, right knee: Secondary | ICD-10-CM | POA: Diagnosis not present

## 2017-10-04 DIAGNOSIS — M25561 Pain in right knee: Secondary | ICD-10-CM | POA: Insufficient documentation

## 2017-10-04 DIAGNOSIS — R52 Pain, unspecified: Secondary | ICD-10-CM

## 2017-10-04 DIAGNOSIS — R609 Edema, unspecified: Secondary | ICD-10-CM

## 2017-10-04 DIAGNOSIS — M25461 Effusion, right knee: Secondary | ICD-10-CM | POA: Insufficient documentation

## 2017-10-04 DIAGNOSIS — M7989 Other specified soft tissue disorders: Secondary | ICD-10-CM | POA: Diagnosis present

## 2017-10-10 ENCOUNTER — Telehealth: Payer: Self-pay | Admitting: Emergency Medicine

## 2017-10-10 MED ORDER — SUMATRIPTAN SUCCINATE 100 MG PO TABS: ORAL_TABLET | ORAL | 6 refills | Status: DC

## 2017-10-10 MED ORDER — CEFDINIR 300 MG PO CAPS: 300.0000 mg | ORAL_CAPSULE | Freq: Two times a day (BID) | ORAL | 0 refills | Status: DC

## 2017-10-10 MED ORDER — METHYLPREDNISOLONE 4 MG PO TBPK: ORAL_TABLET | ORAL | 0 refills | Status: DC

## 2017-10-10 NOTE — Telephone Encounter (Signed)
Sent omnicef and medrol to [facility]

## 2017-10-10 NOTE — Telephone Encounter (Signed)
Medication refill for Imitrex was approved with 6 refills.

## 2017-10-10 NOTE — Telephone Encounter (Signed)
Patient called and wanted to know if you can send in a refill for her migraine medication.

## 2017-10-10 NOTE — Telephone Encounter (Signed)
Patient called office and is complaining about a sinus infection with green mucus and a sinus headache. She would like to know if we could call in a antibiotic and steroid pack for her to the pharmacy. Sent a prescription for Omnicef and a Medrol Dosepak to [facility]

## 2017-10-10 NOTE — Telephone Encounter (Signed)
Patient called and expressed that she is experiencing sinus drainage, congestion, swelling.  She is also coughing up color mucus.  Wants to know if we can send in an antibiotic and steroid pack to [facility].

## 2017-10-13 ENCOUNTER — Telehealth: Payer: Self-pay | Admitting: Emergency Medicine

## 2017-10-13 MED ORDER — FLUCONAZOLE 150 MG PO TABS: ORAL_TABLET | ORAL | 0 refills | Status: DC

## 2017-10-13 NOTE — Telephone Encounter (Signed)
Patient call the office to say she has a yeast infection secondary to the antibiotic she was prescribed, sent a prescription for Diflucan to [facility]

## 2017-10-13 NOTE — Telephone Encounter (Signed)
Patient called and expressed that she has developed a yeast infection secondary to the antibiotic you prescribed.  Could you please call in a diflucan in to [facility]?

## 2017-10-24 ENCOUNTER — Ambulatory Visit: Payer: Self-pay | Admitting: Physician Assistant

## 2017-10-24 VITALS — BP 150/90 | HR 101 | Temp 98.7°F | Resp 16

## 2017-10-24 DIAGNOSIS — J209 Acute bronchitis, unspecified: Secondary | ICD-10-CM

## 2017-10-24 MED ORDER — AZITHROMYCIN 500 MG PO TABS: 500.0000 mg | ORAL_TABLET | Freq: Every day | ORAL | 0 refills | Status: DC

## 2017-10-24 MED ORDER — FEXOFENADINE-PSEUDOEPHED ER 60-120 MG PO TB12: 1.0000 | ORAL_TABLET | Freq: Two times a day (BID) | ORAL | 0 refills | Status: DC

## 2017-10-24 MED ORDER — METHYLPREDNISOLONE 4 MG PO TBPK: ORAL_TABLET | ORAL | 0 refills | Status: DC

## 2017-10-24 MED ORDER — BENZONATATE 200 MG PO CAPS: 200.0000 mg | ORAL_CAPSULE | Freq: Two times a day (BID) | ORAL | 0 refills | Status: DC | PRN

## 2017-10-24 NOTE — Progress Notes (Signed)
   Subjective: URI     Patient ID: Yvonne GOERING, female    DOB: March 06, 1966, 51 y.o.   MRN: [id number]  HPI Patient presented 2 days of nasal congestion, facial pressure, earache, and productive cough. Patient also complaining of headache but she believes secondary to a migraine. Patient taking medicines only for migraine headache.   Review of Systems     migraine headache Objective:   Physical Exam HEENT remarkable for edematous nasal turbinates and bilateral maxillary guarding. Pharynx shows postnasal drainage. Patient active supple with not to be. Lungs have bilateral Rales. Heart regular rate and rhythm.       Assessment & Plan: Bronchitis   Patient given a work note for 2 days. Patient advised to take Zithromax, Allegra-D, Tessalon, Medrol Dosepak as directed. Follow-up with PCP if no improvement within 3 days.

## 2017-12-25 ENCOUNTER — Encounter: Payer: Self-pay | Admitting: Obstetrics and Gynecology

## 2017-12-29 ENCOUNTER — Ambulatory Visit (INDEPENDENT_AMBULATORY_CARE_PROVIDER_SITE_OTHER): Payer: Managed Care, Other (non HMO) | Admitting: Obstetrics and Gynecology

## 2017-12-29 VITALS — BP 162/92 | HR 77 | Ht 62.0 in | Wt 210.2 lb

## 2017-12-29 DIAGNOSIS — R6882 Decreased libido: Secondary | ICD-10-CM

## 2017-12-29 DIAGNOSIS — R5382 Chronic fatigue, unspecified: Secondary | ICD-10-CM | POA: Diagnosis not present

## 2017-12-29 DIAGNOSIS — R1031 Right lower quadrant pain: Secondary | ICD-10-CM | POA: Diagnosis not present

## 2017-12-29 DIAGNOSIS — N951 Menopausal and female climacteric states: Secondary | ICD-10-CM

## 2017-12-29 DIAGNOSIS — G4459 Other complicated headache syndrome: Secondary | ICD-10-CM

## 2017-12-29 MED ORDER — PAROXETINE HCL 20 MG PO TABS: 20.0000 mg | ORAL_TABLET | Freq: Every day | ORAL | 11 refills | Status: DC

## 2017-12-29 MED ORDER — TESTOSTERONE CYPIONATE 100 MG/ML IM SOLN: 50.0000 mg | INTRAMUSCULAR | 1 refills | Status: DC

## 2017-12-29 NOTE — Progress Notes (Signed)
Pt is here with c/o RLQ pain, no sex drive, is not sleeping and has a backache.

## 2017-12-29 NOTE — Progress Notes (Signed)
GYNECOLOGY PROGRESS NOTE  Subjective:    Patient ID: Yvonne [name], female    DOB: 10-16-1966, 52 y.o.   MRN: [id number]  HPI  Patient is a 52 y.o. G2P2 female who presents for complaints of persistent headaches/migraines on HRT.  Previous attempts have been made at using lower doses of prescribed HRT (which did help her headaches, however made her other menopasual vasomotor symptoms worse). 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 She is worried regarding the future of her marriage if her symptoms persist.   She also still notes mild to moderate twinges of pain on her right side, similar to when she would have pain from her ovarian cysts. She is still also noting low energy and fatigue since the surgery.   The following portions of the patient's history were reviewed and updated as appropriate: allergies, current medications, past family history, past medical history, past social history, past surgical history and problem list.  Review of Systems Pertinent items noted in HPI and remainder of comprehensive ROS otherwise negative.   Objective:   Blood pressure (!) 162/92, pulse 77, height 5\' 2"  (1.575 m), weight 210 lb 3 oz (95.3 kg). Body mass index is 38.44 kg/m.  General appearance: alert and no distress Abdomen: soft, non-tender; bowel sounds normal; no masses,  no organomegaly Pelvic: deferred Psychological: normal mood, thought content, judgement and speech.   Assessment:   Headaches Menopausal syndrome Decreased libido Abdominal pain Fatigue  Plan:   1. Headaches -  Patient reports a h/o headaches prior to menopause, however current HRT has worsened her headaches.   Previous attempts at decreasing the dose of estrogen in the past has helped her headaches, however her other symptoms became worse.  I discussed with the patient that the best  option for her would be to discontinue any estrogen use future headache.Alternative options for management were made, including non-hormonal remedies such as Effexor, Paxil, Neurontin, or Clonopin.  These treatments could also help to improve her mood. 2. Menopausal syndrome- After discussion of all treatment options with patient, she desires to initiate Paxil.  We'll start on 20 mg due to menopausal vasomotor symptoms and mood problems.  3. Decreased libido - Patient continues to be concerned of her decreased libido since her surgery approximately year ago.I've discussed options with patient, including herbal remedies and testosterone supplementation. Patient after discussion desires to start on testosterone. Will prescribe. We'll start at 50 mg/month based on patient's current weight, and titrate up as needed.  4. Abdominal pain - Patient still noting twinges of pain at area of prior right laparoscopic site and right lower pelvis. . She reports that over time the pain has decreased since her surgery.  Discussed with patient that she may have scar tissue present, as she no longer has her ovariesDiscussed with patient that as long as it was not causing significant pain or was not persisted, she could take over-the-counter medications as needed. 5. Fatigue - patient with persistent low energy and fatigue since her surgery. May be secondary to surgical menopause and low hormone status.  However will also rule out other causes such as Vitamin deficiencies or endocrine abnormalities. TSH, Vitamin B12 and Vitamin D levels ordered.    A total of 15 minutes were spent face-to-face with the patient during this encounter and over half of that time dealt with counseling and coordination  of care.

## 2017-12-30 ENCOUNTER — Encounter: Payer: Self-pay | Admitting: Obstetrics and Gynecology

## 2017-12-31 LAB — TSH: TSH: 0.56 u[IU]/mL (ref 0.450–4.500)

## 2017-12-31 LAB — VITAMIN B12: VITAMIN B 12: 653 pg/mL (ref 232–1245)

## 2017-12-31 LAB — VITAMIN D 25 HYDROXY (VIT D DEFICIENCY, FRACTURES): VIT D 25 HYDROXY: 35.8 ng/mL (ref 30.0–100.0)

## 2018-01-03 ENCOUNTER — Ambulatory Visit (INDEPENDENT_AMBULATORY_CARE_PROVIDER_SITE_OTHER): Payer: Managed Care, Other (non HMO) | Admitting: Obstetrics and Gynecology

## 2018-01-03 DIAGNOSIS — N951 Menopausal and female climacteric states: Secondary | ICD-10-CM | POA: Diagnosis not present

## 2018-01-03 DIAGNOSIS — R6882 Decreased libido: Secondary | ICD-10-CM

## 2018-01-04 MED ORDER — TESTOSTERONE CYPIONATE 200 MG/ML IM SOLN
200.0000 mg | INTRAMUSCULAR | Status: DC
  Administered 2018-01-03: 200 mg via INTRAMUSCULAR

## 2018-01-04 NOTE — Progress Notes (Signed)
Pt is here for her 1st testosterone injection She tolerated well

## 2018-01-04 NOTE — Progress Notes (Signed)
I have reviewed the record and concur with patient management and plan.  Jhostin Epps, MD [facility]     

## 2018-01-18 ENCOUNTER — Ambulatory Visit: Payer: Self-pay

## 2018-01-20 ENCOUNTER — Ambulatory Visit: Payer: Self-pay

## 2018-02-03 ENCOUNTER — Ambulatory Visit: Payer: Managed Care, Other (non HMO)

## 2018-02-03 MED ORDER — TESTOSTERONE CYPIONATE 200 MG/ML IM SOLN: 200.0000 mg | INTRAMUSCULAR | Status: DC

## 2018-02-03 NOTE — Progress Notes (Unsigned)
Pt is here for testosterone inj, she is doing well, "states her 1st shot has her thinking about intercourse"

## 2018-02-08 ENCOUNTER — Ambulatory Visit: Payer: Managed Care, Other (non HMO) | Admitting: Family

## 2018-02-08 ENCOUNTER — Ambulatory Visit: Payer: Self-pay | Admitting: Family Medicine

## 2018-02-08 ENCOUNTER — Encounter: Payer: Self-pay | Admitting: Family

## 2018-02-08 ENCOUNTER — Telehealth: Payer: Self-pay | Admitting: Family

## 2018-02-08 VITALS — BP 138/88 | HR 69 | Temp 98.7°F | Resp 16 | Ht 62.0 in | Wt 210.0 lb

## 2018-02-08 VITALS — BP 152/100 | HR 96 | Temp 98.5°F | Ht 62.5 in | Wt 211.6 lb

## 2018-02-08 DIAGNOSIS — Z008 Encounter for other general examination: Secondary | ICD-10-CM

## 2018-02-08 DIAGNOSIS — N951 Menopausal and female climacteric states: Secondary | ICD-10-CM

## 2018-02-08 DIAGNOSIS — J301 Allergic rhinitis due to pollen: Secondary | ICD-10-CM | POA: Insufficient documentation

## 2018-02-08 DIAGNOSIS — R51 Headache: Secondary | ICD-10-CM

## 2018-02-08 DIAGNOSIS — Z0189 Encounter for other specified special examinations: Principal | ICD-10-CM

## 2018-02-08 DIAGNOSIS — I10 Essential (primary) hypertension: Secondary | ICD-10-CM | POA: Diagnosis not present

## 2018-02-08 DIAGNOSIS — R519 Headache, unspecified: Secondary | ICD-10-CM | POA: Insufficient documentation

## 2018-02-08 LAB — GLUCOSE, POCT (MANUAL RESULT ENTRY): POC Glucose: 112 mg/dl — AB (ref 70–99)

## 2018-02-08 MED ORDER — PROPRANOLOL HCL 40 MG PO TABS: 40.0000 mg | ORAL_TABLET | Freq: Two times a day (BID) | ORAL | 1 refills | Status: DC

## 2018-02-08 NOTE — Progress Notes (Signed)
Subjective:    Patient ID: Yvonne [name], female    DOB: 11/01/1966, 52 y.o.   MRN: [id number]  CC: Yvonne [name] is a 52 y.o. female who presents today to establish care.    HPI: HA- started when 52 years old. Nausea, vomiting during HA. 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 Neck pain will cause 'migraine.' Usually on left side behind eye. Rarely on right side. Certain smells will trigger HA. Does note a 'tingling over right side of chest/neck.' when feels one coming on. No slurred speech, vision changes, weakness. Have improved since no longer on prempro. Takes 4-8 excredrin per day. Takes imitrex as rescue, uses once per month. No coffee, soda.  Headaches are different at this time.  Headaches are not positional.   HTN - has been on high side with occ health, never started a medication. Family h/o htn. Denies exertional chest pain or pressure, numbness or tingling radiating to left arm or jaw, palpitations,frequent headaches, changes in vision, or shortness of breath.    Menopause symptoms- have improved since stopping prempro-  better immediately. Recent salpingectomy. Uterus intact.  Think hot flashes causing her to have trouble sleeping. On 10 mg melatonin, taking 730pm.      Dr Marcelline Mates- reviewed last note 12/2017; started testosterone, paxil  HISTORY:  Past Medical History:  Diagnosis Date  . Allergy   . GERD (gastroesophageal reflux disease)   . Gestational diabetes    patient denies  . Glaucoma   . H/O bronchitis   . Headache    migraine  . History of ovarian cyst   . Hypertension    pre-eclampsia with first child  . Increased pressure in the eye, bilateral    Past Surgical History:  Procedure Laterality Date  . CERVICAL POLYPECTOMY N/A 04/11/2017   Procedure: CERVICAL POLYPECTOMY;  Surgeon: Rubie Maid, MD;  Location: [facility] ORS;  Service: Gynecology;  Laterality: N/A;  . HYSTEROSCOPY W/D&C N/A 04/11/2017   Procedure:  DILATATION AND CURETTAGE /HYSTEROSCOPY;  Surgeon: Rubie Maid, MD;  Location: [facility] OR[facility];  Service: Gynecology;  Laterality: N/A;  . IUD REMOVAL N/A 05/05/2017   Procedure: INTRAUTERINE DEVICE (IUD) REMOVAL;  Surgeon: Rubie Maid, MD;  Location: [facility] OR[facility];  Service: Gynecology;  Laterality: N/A;  . LAPAROSCOPIC SALPINGO OOPHERECTOMY Bilateral 05/05/2017   Procedure: LAPAROSCOPIC BILATERAL SALPINGO OOPHORECTOMY;  Surgeon: Rubie Maid, MD;  Location: [facility] OR[facility];  Service: Gynecology;  Laterality: Bilateral;  . LAPAROSCOPY N/A 04/11/2017   Procedure: LAPAROSCOPY DIAGNOSTIC;  Surgeon: Rubie Maid, MD;  Location: [facility];  Service: Gynecology;  Laterality: N/A;  . PARTIAL HYSTERECTOMY     Ovaries and Tubes  . TUBAL LIGATION     Family History  Problem Relation Age of Onset  . Schizophrenia Mother   . Bipolar disorder Mother   . Alcohol abuse Father   . Hypertension Father   . Seizures Paternal Grandmother   . Diabetes Paternal Grandmother   . Hypertension Brother   . Hypertension Maternal Grandmother     Allergies: Flonase [fluticasone propionate]; Sulfa antibiotics; and Tussionex pennkinetic er [hydrocod polst-cpm polst er] Current Outpatient Medications on File Prior to Visit  Medication Sig Dispense Refill  . aspirin-acetaminophen-caffeine (EXCEDRIN MIGRAINE) 250-250-65 MG tablet Take 2 tablets by mouth 3 (three) times daily as needed for headache or migraine.     . baclofen (LIORESAL) 10 MG tablet Take 1 tablet (10 mg total) by mouth 3 (three) times daily. 30 each 2  . cetirizine (  ZYRTEC) 10 MG tablet Take 10 mg by mouth daily.    . diphenhydrAMINE (BENADRYL) 25 MG tablet Take 25 mg by mouth at bedtime.    Marland Kitchen ibuprofen (ADVIL,MOTRIN) 800 MG tablet Take 1 tablet (800 mg total) by mouth every 8 (eight) hours as needed. 60 tablet 1  . latanoprost (XALATAN) 0.005 % ophthalmic solution Place 1 drop into both eyes at bedtime.    . Melatonin 10 MG TABS Take 10 mg by mouth at bedtime.    Marland Kitchen  PARoxetine (PAXIL) 20 MG tablet Take 1 tablet (20 mg total) by mouth daily. 30 tablet 11  . promethazine (PHENERGAN) 25 MG tablet Take 1 tablet (25 mg total) by mouth every 8 (eight) hours as needed for nausea or vomiting. 20 tablet 0  . simethicone (GAS-X) 80 MG chewable tablet Chew 1 tablet (80 mg total) by mouth every 6 (six) hours as needed for flatulence. For gas and bloating. 30 tablet 0  . SUMAtriptan (IMITREX) 100 MG tablet May repeat in 2 hours if headache persists or recurs. 10 tablet 6  . testosterone cypionate (DEPO-TESTOSTERONE) 100 MG/ML injection Inject 0.5 mLs (50 mg total) into the muscle every 28 (twenty-eight) days. For IM use only 10 mL 1  . timolol (BETIMOL) 0.25 % ophthalmic solution Place 1 drop into both eyes daily.     Current Facility-Administered Medications on File Prior to Visit  Medication Dose Route Frequency Provider Last Rate Last Dose  . testosterone cypionate (DEPOTESTOSTERONE CYPIONATE) injection 200 mg  200 mg Intramuscular Q28 days Rubie Maid, MD   200 mg at 01/03/18 1135  . testosterone cypionate (DEPOTESTOSTERONE CYPIONATE) injection 200 mg  200 mg Intramuscular Q28 days Rubie Maid, MD        Social History   Tobacco Use  . Smoking status: Former Smoker    Packs/day: 1.00    Years: 6.00    Pack years: 6.00    Types: Cigarettes    Last attempt to quit: 05/29/2000    Years since quitting: 17.7  . Smokeless tobacco: Never Used  . Tobacco comment: 1/2-1 PPD  Substance Use Topics  . Alcohol use: No    Alcohol/week: 0.0 oz  . Drug use: No    Review of Systems  Constitutional: Negative for chills and fever.  Eyes: Negative for visual disturbance.  Respiratory: Negative for cough.   Cardiovascular: Negative for chest pain and palpitations.  Gastrointestinal: Negative for nausea and vomiting.  Neurological: Positive for headaches. Negative for dizziness, syncope and weakness.  Psychiatric/Behavioral: Positive for sleep disturbance. Negative for  suicidal ideas. The patient is not nervous/anxious.       Objective:    BP (!) 152/100 (BP Location: Right Arm, Patient Position: Sitting, Cuff Size: Normal)   Pulse 96   Temp 98.5 F (36.9 C) (Oral)   Ht 5' 2.5" (1.588 m)   Wt 211 lb 9.6 oz (96 kg)   LMP  (LMP Unknown)   SpO2 99%   BMI 38.09 kg/m  BP Readings from Last 3 Encounters:  02/08/18 (!) 152/100  02/08/18 138/88  02/03/18 (!) 140/108   Wt Readings from Last 3 Encounters:  02/08/18 211 lb 9.6 oz (96 kg)  02/08/18 210 lb (95.3 kg)  02/03/18 210 lb (95.3 kg)    Physical Exam  Constitutional: She appears well-developed and well-nourished.  HENT:  Mouth/Throat: Uvula is midline, oropharynx is clear and moist and mucous membranes are normal.  Eyes: Conjunctivae and EOM are normal. Pupils are equal, round, and reactive  to light.  Fundus normal bilaterally.   Cardiovascular: Normal rate, regular rhythm, normal heart sounds and normal pulses.  Pulmonary/Chest: Effort normal and breath sounds normal. She has no wheezes. She has no rhonchi. She has no rales.  Neurological: She is alert. She has normal strength. No cranial nerve deficit or sensory deficit. She displays a negative Romberg sign.  Reflex Scores:      Bicep reflexes are 2+ on the right side and 2+ on the left side.      Patellar reflexes are 2+ on the right side and 2+ on the left side. Grip equal and strong bilateral upper extremities. Gait strong and steady. Able to perform rapid alternating movement without difficulty.   Skin: Skin is warm and dry.  Psychiatric: She has a normal mood and affect. Her speech is normal and behavior is normal. Thought content normal.  Vitals reviewed.      Assessment & Plan:   Problem List Items Addressed This Visit      Cardiovascular and Mediastinum   Hypertension    Elevated today.  Discussed with patient that I would question if the testosterone is contributory.  Reassured by normal neurologic exam.  In the context of  frequent, worsening headaches, we jointly agreed to go ahead and start propanolol to see if this will help with blood pressure.  Patient will monitor for home and return for follow-up in 2-3 weeks.       Relevant Medications   propranolol (INDERAL) 40 MG tablet     Other   Nonintractable headache - Primary    Reassured by normal neurologic exam.  Headache is similar to patient's headaches in the past.  Discussed my concern with medication overuse and advised patient to stop Excedrin.  Will start propanolol as a daily medication.  She continues to be off the Prempro which seemed to be contributory.  She politely declines MRI of the brain today. Close follow-up in 2-3 weeks      Relevant Medications   propranolol (INDERAL) 40 MG tablet   Menopausal hot flushes    Improvement on the Paxil.  Since hot flashes are primarily affecting patient's ability to sleep.  We discussed trying gabapentin in the near future.  We will discuss at follow-up          I am having Loletha E. Kobus start on propranolol. I am also having her maintain her cetirizine, diphenhydrAMINE, latanoprost, timolol, aspirin-acetaminophen-caffeine, ibuprofen, simethicone, Melatonin, baclofen, promethazine, SUMAtriptan, PARoxetine, and testosterone cypionate. We will continue to administer testosterone cypionate and testosterone cypionate.   Meds ordered this encounter  Medications  . propranolol (INDERAL) 40 MG tablet    Sig: Take 1 tablet (40 mg total) by mouth 2 (two) times daily.    Dispense:  120 tablet    Refill:  1    Order Specific Question:   Supervising Provider    Answer:   Crecencio Mc [[id number]]    Return precautions given.   Risks, benefits, and alternatives of the medications and treatment plan prescribed today were discussed, and patient expressed understanding.   Education regarding symptom management and diagnosis given to patient on AVS.  Continue to follow with Burnard Hawthorne, FNP for routine  health maintenance.   [name] and I agreed with plan.   Mable Paris, FNP

## 2018-02-08 NOTE — Patient Instructions (Addendum)
Follow up in 2-3 weeks, sooner if blood pressure or headaches dont improve.  STOP excedrin ( dont hate me!)   Start propranolol; let me know if feel lightheaded or dizzy. Goal of BP < 130/80   This is  Dr. Lupita Dawn  example of a  "Low GI"  Diet:  It will allow you to lose 4 to 8  lbs  per month if you follow it carefully.  Your goal with exercise is a minimum of 30 minutes of aerobic exercise 5 days per week (Walking does not count once it becomes easy!)    All of the foods can be found at grocery stores and in bulk at [employer].  The Atkins protein bars and shakes are available in more varieties at [employer], [employer] and [place].     7 AM Breakfast:  Choose from the following:  Low carbohydrate Protein  Shakes (I recommend the  Premier Protein chocolate shakes,  EAS AdvantEdge "Carb Control" shakes  Or the Atkins shakes all are under 3 net carbs)     a scrambled egg/bacon/cheese burrito made with Mission's "carb balance" whole wheat tortilla  (about 10 net carbs )  Regulatory affairs officer (basically a quiche without the pastry crust) that is eaten cold and very convenient way to get your eggs.  8 carbs)  If you make your own protein shakes, avoid bananas and pineapple,  And use low carb greek yogurt or original /unsweetened almond or soy milk    Avoid cereal and bananas, oatmeal and cream of wheat and grits. They are loaded with carbohydrates!   10 AM: high protein snack:  Protein bar by Atkins (the snack size, under 200 cal, usually < 6 net carbs).    A stick of cheese:  Around 1 carb,  100 cal     Dannon Light n Fit [place] Yogurt  (80 cal, 8 carbs)  Other so called "protein bars" and Greek yogurts tend to be loaded with carbohydrates.  Remember, in food advertising, the word "energy" is synonymous for " carbohydrate."  Lunch:   A Sandwich using the bread choices listed, Can use any  Eggs,  lunchmeat, grilled meat or canned tuna), avocado, regular mayo/mustard  and  cheese.  A Salad using blue cheese, ranch,  Goddess or vinagrette,  Avoid taco shells, croutons or "confetti" and no "candied nuts" but regular nuts OK.   No pretzels, nabs  or chips.  Pickles and miniature sweet peppers are a good low carb alternative that provide a "crunch"  The bread is the only source of carbohydrate in a sandwich and  can be decreased by trying some of the attached alternatives to traditional loaf bread   Avoid "Low fat dressings, as well as [place] dressings They are loaded with sugar!   3 PM/ Mid day  Snack:  Consider  1 ounce of  almonds, walnuts, pistachios, pecans, peanuts,  Macadamia nuts or a nut medley.  Avoid "granola and granola bars "  Mixed nuts are ok in moderation as long as there are no raisins,  cranberries or dried fruit.   KIND bars are OK if you get the low glycemic index variety   Try the prosciutto/mozzarella cheese sticks by [employer]  In deli /backery section   High protein      6 PM  Dinner:     Meat/fowl/fish with a green salad, and either broccoli, cauliflower, green beans, spinach, brussel sprouts or  Lima beans. DO NOT  BREAD THE PROTEIN!!      There is a low carb pasta by Dreamfield's that is acceptable and tastes great: only 5 digestible carbs/serving.( All grocery stores but BJs carry it ) Several ready made meals are available low carb:   Try Michel Angelo's chicken piccata or chicken or eggplant parm over low carb pasta.(Lowes and BJs)   Marjory Lies Sanchez's "Carnitas" (pulled pork, no sauce,  0 carbs) or his beef pot roast to make a dinner burrito (at [employer]J's)  Pesto over low carb pasta (bj's sells a good quality pesto in the center refrigerated section of the deli   Try satueeing  Cheral Marker with mushroooms as a good side   [employer] Giant makes a mashed cauliflower that tastes like mashed potatoes  Whole wheat pasta is still full of digestible carbs and  Not as low in glycemic index as Dreamfield's.   Brown rice is still rice,   So skip the rice and noodles if you eat [place] or [place] (or at least limit to 1/2 cup)  9 PM snack :   Breyer's "low carb" fudgsicle or  ice cream bar (Carb Smart line), or  Weight Watcher's ice cream bar , or another "no sugar added" ice cream;  a serving of fresh berries/cherries with whipped cream   Cheese or DANNON'S LlGHT N FIT GREEK YOGURT  8 ounces of [place] unsweetened almond/cococunut milk    Treat yourself to a parfait made with whipped cream blueberiies, walnuts and vanilla greek yogurt  Avoid bananas, pineapple, grapes  and watermelon on a regular basis because they are high in sugar.  THINK OF THEM AS DESSERT  Remember that snack Substitutions should be less than 10 NET carbs per serving and meals < 20 carbs. Remember to subtract fiber grams to get the "net carbs."  @TULLOBREADPACKAGE @   Analgesic Rebound Headache An analgesic rebound headache, sometimes called a medication overuse headache, is a headache that comes after pain medicine (analgesic) taken to treat the original (primary) headache has worn off. Any type of primary headache can return as a rebound headache if a person regularly takes analgesics more than three times a week to treat it. The types of primary headaches that are commonly associated with rebound headaches include:  Migraines.  Headaches that arise from tense muscles in the head and neck area (tension headaches).  Headaches that develop and happen again (recur) on one side of the head and around the eye (cluster headaches).  If rebound headaches continue, they become chronic daily headaches. What are the causes? This condition may be caused by frequent use of:  Over-the-counter medicines such as aspirin, ibuprofen, and acetaminophen.  Sinus relief medicines and other medicines that contain caffeine.  Narcotic pain medicines such as codeine and oxycodone.  What are the signs or symptoms? The symptoms of a rebound headache are the same as  the symptoms of the original headache. Some of the symptoms of specific types of headaches include: Migraine headache  Pulsing or throbbing pain on one or both sides of the head.  Severe pain that interferes with daily activities.  Pain that is worsened by physical activity.  Nausea, vomiting, or both.  Pain with exposure to bright light, loud noises, or strong smells.  General sensitivity to bright light, loud noises, or strong smells.  Visual changes.  Numbness of one or both arms. Tension headache  Pressure around the head.  Dull, aching head pain.  Pain felt over the front and sides of the head.  Tenderness in the muscles of the head, neck, and shoulders. Cluster headache  Severe pain that begins in or around one eye or temple.  Redness and tearing in the eye on the same side as the pain.  Droopy or swollen eyelid.  One-sided head pain.  Nausea.  Runny nose.  Sweaty, pale facial skin.  Restlessness. How is this diagnosed? This condition is diagnosed by:  Reviewing your medical history. This includes the nature of your primary headaches.  Reviewing the types of pain medicines that you have been using to treat your headaches and how often you take them.  How is this treated? This condition may be treated or managed by:  Discontinuing frequent use of the analgesic medicine. Doing this may worsen your headaches at first, but the pain should eventually become more manageable, less frequent, and less severe.  Seeing a headache specialist. He or she may be able to help you manage your headaches and help make sure there is not another cause of the headaches.  Using methods of stress relief, such as acupuncture, counseling, biofeedback, and massage. Talk with your health care provider about which methods might be good for you.  Follow these instructions at home:  Take over-the-counter and prescription medicines only as told by your health care  provider.  Stop the repeated use of pain medicine as told by your health care provider. Stopping can be difficult. Carefully follow instructions from your health care provider.  Avoid triggers that are known to cause your primary headaches.  Keep all follow-up visits as told by your health care provider. This is important. Contact a health care provider if:  You continue to experience headaches after following treatments that your health care provider recommended. Get help right away if:  You develop new headache pain.  You develop headache pain that is different than what you have experienced in the past.  You develop numbness or tingling in your arms or legs.  You develop changes in your speech or vision. This information is not intended to replace advice given to you by your health care provider. Make sure you discuss any questions you have with your health care provider. Document Released: 02/05/2004 Document Revised: 06/04/2016 Document Reviewed: 04/19/2016 Elsevier Interactive Patient Education  [employer].

## 2018-02-08 NOTE — Assessment & Plan Note (Signed)
Reassured by normal neurologic exam.  Headache is similar to patient's headaches in the past.  Discussed my concern with medication overuse and advised patient to stop Excedrin.  Will start propanolol as a daily medication.  She continues to be off the Prempro which seemed to be contributory.  She politely declines MRI of the brain today. Close follow-up in 2-3 weeks

## 2018-02-08 NOTE — Assessment & Plan Note (Signed)
Improvement on the Paxil.  Since hot flashes are primarily affecting patient's ability to sleep.  We discussed trying gabapentin in the near future.  We will discuss at follow-up

## 2018-02-08 NOTE — Progress Notes (Signed)
Subjective: Annual biometrics screening  Patient presents for their annual biometric screening.  PCP: establishing care at [facility] today.  Patient works at [employer]. Patient denies any other issues or concerns.   Review of Systems Constitutional: Unremarkable.  HEENT: Denies dizziness, issues with hearing, vision problems.  Gastrointestinal: Denies issues with bowel or bladder.  Respiratory: Unremarkable.   Cardiovascular: Unremarkable.  Musculoskeletal: No significant arthralgias, myalgias, joint swelling, joint stiffness, back pain, neck pain. ROS otherwise negative.   Objective  Physical Exam General: Awake, alert and oriented. No acute distress. Well developed, hydrated and nourished. Appears stated age.  HEENT:. Supple neck without adenopathy. Sclera is non-icteric. The ear canal is clear without discharge. The tympanic membrane is normal in appearance with normal landmarks and cone of light. Nasal mucosa is pink and moist. Oral mucosa is pink and moist. The pharynx is normal in appearance without tonsillar swelling or exudates.  Skin: Skin in warm, dry and intact without rashes or lesions. Appropriate color for ethnicity. Cardiac: Heart rate and rhythm are normal. No murmurs, gallops, or rubs are auscultated.  Respiratory: The chest wall is symmetric and without deformity. No signs of respiratory distress. Lung sounds are clear in all lobes bilaterally without rales, ronchi, or wheezes.  Abdominal: Abdomen is soft, symmetric, and non-tender without distention. No masses, hepatomegaly, or splenomegaly are noted.  Neurological: The patient is awake, alert and oriented to person, place, and time with normal speech.  Memory is normal and thought processes intact. No gait abnormalities are appreciated.  Psychiatric: Appropriate mood and affect.    Fasting blood sugar: 112 today in the office. Patient reports this represents a true fasting value.   Assessment Annual biometrics  screen  Plan  Labs pending. Encouraged routine visits with primary care provider.  Advised patient to discuss her elevated fasting blood sugar with her PCP at her apt today at 11:30 am educated the patient regarding possible implications of impaired fasting glucose.

## 2018-02-08 NOTE — Telephone Encounter (Signed)
Patient called for clarification on a new medication she received at today's visit. Propranolol 40 MG, take 1 tab twice a day.Stated she understood.

## 2018-02-08 NOTE — Assessment & Plan Note (Signed)
Elevated today.  Discussed with patient that I would question if the testosterone is contributory.  Reassured by normal neurologic exam.  In the context of frequent, worsening headaches, we jointly agreed to go ahead and start propanolol to see if this will help with blood pressure.  Patient will monitor for home and return for follow-up in 2-3 weeks.

## 2018-02-09 ENCOUNTER — Telehealth: Payer: Self-pay

## 2018-02-09 LAB — LIPID PANEL
CHOLESTEROL TOTAL: 269 mg/dL — AB (ref 100–199)
Chol/HDL Ratio: 5.5 ratio — ABNORMAL HIGH (ref 0.0–4.4)
HDL: 49 mg/dL (ref >39)
LDL CALC: 195 mg/dL — AB (ref 0–99)
TRIGLYCERIDES: 127 mg/dL (ref 0–149)
VLDL Cholesterol Cal: 25 mg/dL (ref 5–40)

## 2018-02-09 NOTE — Progress Notes (Signed)
Yvonne [name], I wanted to inform you that your lab results are back.  Your total cholesterol is elevated at 269, normal is between 100 and 199.  Your LDL cholesterol ("bad cholesterol") is elevated at 195, normal is between 0 and 99.  As a result of these elevations your total cholesterol/HDL ratio is elevated at 5.5, normal is between 0 and 4.4.  These values place you at an increased risk for cardiovascular disease.  I wanted to inform you of these results so that you could discuss them with your primary care provider.

## 2018-02-09 NOTE — Telephone Encounter (Signed)
Copied from [place]. Topic: Quick Communication - See Telephone Encounter >> Feb 09, 2018  9:38 AM Antonieta Iba C wrote: CRM for notification. See Telephone encounter for: pt was seen for migraine and was advised to call in to make provider aware of the next migraine that she has. Pt says that she was told to stop taking Excedrin.    CB: [phone] (okay to lvm) if call back   02/09/18.

## 2018-02-10 ENCOUNTER — Ambulatory Visit: Payer: Self-pay | Admitting: *Deleted

## 2018-02-10 ENCOUNTER — Encounter: Payer: Self-pay | Admitting: Family

## 2018-02-10 NOTE — Telephone Encounter (Signed)
Pt was called an made aware. Pt verbalized understanding.

## 2018-02-10 NOTE — Telephone Encounter (Signed)
noted 

## 2018-02-10 NOTE — Telephone Encounter (Signed)
Pt called with c/o migraine. She states she has them everyday but this one has gotten progressively worst. She said the pain is on the left side and behind her left eye. She was seen in the office on 02/08/18. She was told to stop taking the Excedrin Migraine. She has Sumatriptan but was not sure that she can take this. I advised the patient to go ahead and take her dose of Sumatriptan. And I will contact the office regarding what she needs to take next.   She would like for her pcp to contact her via MyChart as for as what she can take. She also states that she has  8 tablets left of the Sumatriptan and may need a refill.  Will contact flow at [facility].  Reason for Disposition . Similar to previously diagnosed migraine headaches  Answer Assessment - Initial Assessment Questions 1. LOCATION: "Where does it hurt?"      Left side head behind the left eye 2. ONSET: "When did the headache start?" (Minutes, hours or days)      Yesterday morning 3. PATTERN: "Does the pain come and go, or has it been constant since it started?"     constant 4. SEVERITY: "How bad is the pain?" and "What does it keep you from doing?"  (e.g., Scale 1-10; mild, moderate, or severe)   - MILD (1-3): doesn't interfere with normal activities    - MODERATE (4-7): interferes with normal activities or awakens from sleep    - SEVERE (8-10): excruciating pain, unable to do any normal activities        Severe, #8 5. RECURRENT SYMPTOM: "Have you ever had headaches before?" If so, ask: "When was the last time?" and "What happened that time?"      Yes. Has had injections for them. Not sure 6. CAUSE: "What do you think is causing the headache?"     Not sure, possibly stopping the Excedrin, off the caffiene 7. MIGRAINE: "Have you been diagnosed with migraine headaches?" If so, ask: "Is this headache similar?"      yes 8. HEAD INJURY: "Has there been any recent injury to the head?"      no 9. OTHER SYMPTOMS: "Do you  have any other symptoms?" (fever, stiff neck, eye pain, sore throat, cold symptoms)     Eye pain, arthritis in neck 10. PREGNANCY: "Is there any chance you are pregnant?" "When was your last menstrual period?"       No, had surgery to remove tubes and ovaries June of last year  Protocols used: HEADACHE-A-AH

## 2018-02-10 NOTE — Telephone Encounter (Signed)
Call pt Any change on propranolol or not yet? How is BP?  She may need to take excredin but I would do very very sparingly as suspect actually making HA's worse ( rebound).   It will have to come out of her system.Marland Kitchen

## 2018-02-10 NOTE — Telephone Encounter (Signed)
Sumatriptan okay. She may also take phenergan for abortive treatment- its drowsy so she needs to be home. Hopefully some sleep can help break rhe HA cycle We are trying to wean off of the daily excredrin  I hesitate to increase her propranolol so soon as BP could go to low.

## 2018-02-10 NOTE — Telephone Encounter (Signed)
Pt states that her migraine has not gotten any better, she states that she is unable to check her BP due to not having a BP machine at home. Pt was advise to go to a local pharmacy and check BP or buy a home BP monitoring machine and check BP once daily.

## 2018-02-22 ENCOUNTER — Encounter: Payer: Self-pay | Admitting: Obstetrics and Gynecology

## 2018-02-22 ENCOUNTER — Ambulatory Visit (INDEPENDENT_AMBULATORY_CARE_PROVIDER_SITE_OTHER): Payer: Managed Care, Other (non HMO) | Admitting: Obstetrics and Gynecology

## 2018-02-22 VITALS — BP 142/84 | HR 72 | Ht 62.0 in | Wt 211.3 lb

## 2018-02-22 DIAGNOSIS — N951 Menopausal and female climacteric states: Secondary | ICD-10-CM | POA: Diagnosis not present

## 2018-02-22 DIAGNOSIS — L678 Other hair color and hair shaft abnormalities: Secondary | ICD-10-CM | POA: Diagnosis not present

## 2018-02-22 DIAGNOSIS — R6882 Decreased libido: Secondary | ICD-10-CM | POA: Diagnosis not present

## 2018-02-22 MED ORDER — EFLORNITHINE HCL 13.9 % EX CREA: 1.0000 | TOPICAL_CREAM | Freq: Two times a day (BID) | CUTANEOUS | 3 refills | Status: DC

## 2018-02-22 NOTE — Patient Instructions (Signed)
Menopause and Herbal Products What is menopause? Menopause is the normal time of life when menstrual periods decrease in frequency and eventually stop completely. This process can take several years for some women. Menopause is complete when you have had an absence of menstruation for a full year since your last menstrual period. It usually occurs between the ages of 48 and 55. It is not common for menopause to begin before the age of 40. During menopause, your body stops producing the female hormones estrogen and progesterone. Common symptoms associated with this loss of hormones (vasomotor symptoms) are:  Hot flashes.  Hot flushes.  Night sweats.  Other common symptoms and complications of menopause include:  Decrease in sex drive.  Vaginal dryness and thinning of the walls of the vagina. This can make sex painful.  Dryness of the skin and development of wrinkles.  Headaches.  Tiredness.  Irritability.  Memory problems.  Weight gain.  Bladder infections.  Hair growth on the face and chest.  Inability to reproduce offspring (infertility).  Loss of density in the bones (osteoporosis) increasing your risk for breaks (fractures).  Depression.  Hardening and narrowing of the arteries (atherosclerosis). This increases your risk of heart attack and stroke.  What treatment options are available? There are many treatment choices for menopause symptoms. The most common treatment is hormone replacement therapy. Many alternative therapies for menopause are emerging, including the use of herbal products. These supplements can be found in the form of herbs, teas, oils, tinctures, and pills. Common herbal supplements for menopause are made from plants that contain phytoestrogens. Phytoestrogens are compounds that occur naturally in plants and plant products. They act like estrogen in the body. Foods and herbs that contain phytoestrogens include:  Soy.  Flax seeds.  Red  clover.  Ginseng.  What menopause symptoms may be helped if I use herbal products?  Vasomotor symptoms. These may be helped by: ? Soy. Some studies show that soy may have a moderate benefit for hot flashes. ? Black cohosh. There is limited evidence indicating this may be beneficial for hot flashes.  Symptoms that are related to heart and blood vessel disease. These may be helped by soy. Studies have shown that soy can help to lower cholesterol.  Depression. This may be helped by: ? St. John's wort. There is limited evidence that shows this may help mild to moderate depression. ? Black cohosh. There is evidence that this may help depression and mood swings.  Osteoporosis. Soy may help to decrease bone loss that is associated with menopause and may prevent osteoporosis. Limited evidence indicates that red clover may offer some bone loss protection as well. Other herbal products that are commonly used during menopause lack enough evidence to support their use as a replacement for conventional menopause therapies. These products include evening primrose, ginseng, and red clover. What are the cases when herbal products should not be used during menopause? Do not use herbal products during menopause without your health care provider's approval if:  You are taking medicine.  You have a preexisting liver condition.  Are there any risks in my taking herbal products during menopause? If you choose to use herbal products to help with symptoms of menopause, keep in mind that:  Different supplements have different and unmeasured amounts of herbal ingredients.  Herbal products are not regulated the same way that medicines are.  Concentrations of herbs may vary depending on the way they are prepared. For example, the concentration may be different in a pill,   tea, oil, and tincture.  Little is known about the risks of using herbal products, particularly the risks of long-term use.  Some herbal  supplements can be harmful when combined with certain medicines.  Most commonly reported side effects of herbal products are mild. However, if used improperly, many herbal supplements can cause serious problems. Talk to your health care provider before starting any herbal product. If problems develop, stop taking the supplement and let your health care provider know. This information is not intended to replace advice given to you by your health care provider. Make sure you discuss any questions you have with your health care provider. Document Released: 05/03/2008 Document Revised: 10/12/2016 Document Reviewed: 04/30/2014 Elsevier Interactive Patient Education  2017 Elsevier Inc.  

## 2018-02-22 NOTE — Progress Notes (Signed)
    GYNECOLOGY PROGRESS NOTE  Subjective:    Patient ID: Yvonne [name], female    DOB: 1966/10/16, 52 y.o.   MRN: [id number]  HPI  Patient is a 52 y.o. G2P2 female who presents for 2 month f/u for decreased libido. She is currently receiving monthly testosterone injections.  She notes the injections have helped some, she is now more frequently thinking about intercourse.  Notes only concerning side effect is that she is having increased facial hair (is now having to pluck frequently).    Patient also notes concern that she thinks the testosterone injections may be causing elevated blood pressures.  Was seen by her PCP recently, and was noted to have high blood pressure.  Was given a BP diary.   In addition she complains that she is still having hot flushes.    The following portions of the patient's history were reviewed and updated as appropriate: allergies, current medications, past family history, past medical history, past social history, past surgical history and problem list.  Review of Systems Pertinent items noted in HPI and remainder of comprehensive ROS otherwise negative.   Objective:   Blood pressure (!) 142/84, pulse 72, height 5\' 2"  (1.575 m), weight 211 lb 4.8 oz (95.8 kg). General appearance: alert and no distress Skin: no sores or suspicious lesions or rashes or color changes. Faint stubble beneath chin.  Remainder of exam deferred.    Assessment:   Decreased libido Facial hair growth Hypertension Vasomotor symptoms   Plan:   1. Decreased libido - patient notes that she is slowly noticing improvement in her libido. Would recommend increasing dose of testosterone injections to further improve libido symptoms, however patient shows concern for worsening of hair growth. Discussed options for management of hair growth (see below).  2. Facial hair growth - Discussed options of hair removal, including Vaniqa, OTC depilatory creams, and waxing.  Did not recommend laser  hair removal as this issue is temporary due to use of the testosterone.  Patient desires to try Vaniqa. Will prescribe.  3. Hypertension - Patient initially concerned that her blood pressure was being elevated due to the testosterone injections. However upon review of her chart I discussed with her that her blood pressures have been elevated for almost one year.  We initially considered her high blood pressures to be due to her being in pain as she was suffering from large ovarian cysts for several months (while undergoing medical management, ultimately having to undergo surgery for removal), However after removal of the cysts, her blood pressure remained elevated.  Discussed that she was indeed hypertensive. To discuss further with her PCP.  4.  Vasomotor symptoms - patient currently on Paxil (was increased from 10 mg to 20 mg several months ago).  Discussed options of changing to different non-hormonal method  (patient cannot take estrogen containing products due to worsening of migraines) or herbal remedies. Patient would like to try herbal option, given handout.  5. To f/u next month for next testosterone injection, in 2-3 months for re-evaluation of symptoms (libido and vasomotor).     A total of 15 minutes were spent face-to-face with the patient during this encounter and over half of that time dealt with counseling and coordination of care.   Rubie Maid, MD [facility]

## 2018-02-22 NOTE — Progress Notes (Signed)
Pt stated that her blood pressure has been elevating and she think it my be the testosterone shots.

## 2018-02-23 ENCOUNTER — Encounter: Payer: Self-pay | Admitting: Obstetrics and Gynecology

## 2018-02-23 ENCOUNTER — Other Ambulatory Visit: Payer: Self-pay

## 2018-02-24 ENCOUNTER — Telehealth: Payer: Self-pay | Admitting: Family

## 2018-02-24 DIAGNOSIS — I10 Essential (primary) hypertension: Secondary | ICD-10-CM

## 2018-02-24 MED ORDER — AMLODIPINE BESYLATE 2.5 MG PO TABS: 2.5000 mg | ORAL_TABLET | Freq: Every day | ORAL | 3 refills | Status: DC

## 2018-02-24 NOTE — Telephone Encounter (Signed)
Call patient Patient was recently seen by OB/GYN, Dr. Marcelline Mates.  Dr Marcelline Mates did not think the testosterone replacement was the reason for elevated blood pressures since they were elevated prior to.    In that case, I would recommend starting a medication in addition to propanolol.  If she is on propranolol at her last OB visit, her blood pressure was still not at goal.    I would advise she needs a low dose medication called amlodipine to see if we get blood pressures consistently less than 130/80.    I will send in the medication today.  Please advise patient for follow-up to see me in the next month. Please advise low salt diet.

## 2018-02-28 NOTE — Telephone Encounter (Signed)
Left voice mail to call back ok for PEC to speak to patient 

## 2018-02-28 NOTE — Telephone Encounter (Signed)
Patient notified- she did pick up the medication and start it- she does report her numbers are better and she does have an appointment tomorrow.

## 2018-03-01 ENCOUNTER — Ambulatory Visit (INDEPENDENT_AMBULATORY_CARE_PROVIDER_SITE_OTHER): Payer: Managed Care, Other (non HMO) | Admitting: Family

## 2018-03-01 ENCOUNTER — Encounter: Payer: Self-pay | Admitting: Family

## 2018-03-01 VITALS — BP 130/96 | HR 61 | Temp 99.1°F | Resp 16 | Wt 213.4 lb

## 2018-03-01 DIAGNOSIS — R51 Headache: Secondary | ICD-10-CM | POA: Diagnosis not present

## 2018-03-01 DIAGNOSIS — R519 Headache, unspecified: Secondary | ICD-10-CM

## 2018-03-01 DIAGNOSIS — L989 Disorder of the skin and subcutaneous tissue, unspecified: Secondary | ICD-10-CM | POA: Diagnosis not present

## 2018-03-01 DIAGNOSIS — B379 Candidiasis, unspecified: Secondary | ICD-10-CM

## 2018-03-01 DIAGNOSIS — H6501 Acute serous otitis media, right ear: Secondary | ICD-10-CM

## 2018-03-01 DIAGNOSIS — I10 Essential (primary) hypertension: Secondary | ICD-10-CM

## 2018-03-01 MED ORDER — FLUCONAZOLE 150 MG PO TABS: 150.0000 mg | ORAL_TABLET | Freq: Once | ORAL | 1 refills | Status: AC

## 2018-03-01 MED ORDER — AMOXICILLIN 500 MG PO CAPS
500.0000 mg | ORAL_CAPSULE | Freq: Two times a day (BID) | ORAL | 0 refills | Status: DC
Start: 2018-03-01 — End: 2018-03-31

## 2018-03-01 NOTE — Assessment & Plan Note (Signed)
Reassured by benign HEENT exam.  Due to duration of symptoms and sharp nature of pain, I went ahead and gave patient a prescription of amoxicillin .  Patient will start antibiotic with probiotics and let me know if no improvement

## 2018-03-01 NOTE — Patient Instructions (Addendum)
Steer clear of DECONGESTANT. May take mucinex.   Number one goal is get blood pressure to acceptable range as suspect this is contributing to headaches.   Take amlodipine for another few days and see if blood pressure improves.  If not at goal, we need to increase to 5mg  once per day from the 2.5mg  amlodipine that you are on.   Monitor blood pressure,  Goal is less than 130/80; if persistently higher, please make sooner follow up appointment so we can recheck you blood pressure and manage medications  Today we discussed referrals, orders- referral to DERMATOLOGY   I have placed these orders in the system for you.  Please be sure to give [place] a call if you have not heard from our office regarding scheduling a test or regarding referral in a timely manner.  It is very important that you let me know as soon as possible.   If headaches persist, I need to know as we would trial another medicaiton and consult neurology.

## 2018-03-01 NOTE — Assessment & Plan Note (Addendum)
Reassured as improved on propranolol and headache similar to headaches in the past.  See note on hypertension.  Patient taking less Excedrin which I am very happy to see.  Will focus on blood pressure as suspect contributory.  Will follow.

## 2018-03-01 NOTE — Assessment & Plan Note (Signed)
Continues to be elevated. Advised to stop decongestants. Will stay at current dose of amlodipine since not quite a week.  Discussed with patient at  Length that my priority goal is to get blood pressure at goal;  suspect this may be contributory to headaches.  At that point if headaches persist, I still want her to take less Excedrin.  I am pleased that she is taking significant less amount however we cannot increase the propranolol based on her HR 61 today ( Prior HR 96) . Will continue to monitor. Close followup.

## 2018-03-01 NOTE — Progress Notes (Signed)
Subjective:    Patient ID: Yvonne [name], female    DOB: Apr 23, 1966, 52 y.o.   MRN: [id number]  CC: Yvonne [name] is a 52 y.o. female who presents today for follow up.   HPI: HTN- prior to starting amlodipine, SBP at home at been 160s. Doesn't recall lower number. Started amlodipine 2.5 mg 5 days ago. On propranolol and thinks helping HA's. Denies exertional chest pain or pressure, numbness or tingling radiating to left arm or jaw, palpitations, dizziness, changes in vision, or shortness of breath.   Headache- Feels similar to headaches in the past. 'Slight' HA today. Taking less excredin since propranolol, twice per day, improvement from excredrin 12 tablets per day per patient. Sleeping better and cut out caffeine.    Has a complaint of right ear pain , shooting pain , started 4 days ago, worsening. Endorses thick sinus congestion, runny nose. No fever, chills, ear drainage. Taking zyrtec, decongestant, sinus pill from [employer]. Would like diflucan as gets yeast infection with antibiotics.   On testosterone, noticed facial hair. Using eflornithine   Would like to see  Dermatology for skin tag under right arm.           HISTORY:  Past Medical History:  Diagnosis Date  . Allergy   . GERD (gastroesophageal reflux disease)   . Gestational diabetes    patient denies  . Glaucoma   . H/O bronchitis   . Headache    migraine  . History of ovarian cyst   . Hypertension    pre-eclampsia with first child  . Increased pressure in the eye, bilateral    Past Surgical History:  Procedure Laterality Date  . CERVICAL POLYPECTOMY N/A 04/11/2017   Procedure: CERVICAL POLYPECTOMY;  Surgeon: Rubie Maid, MD;  Location: [facility];  Service: Gynecology;  Laterality: N/A;  . HYSTEROSCOPY W/D&C N/A 04/11/2017   Procedure: DILATATION AND CURETTAGE /HYSTEROSCOPY;  Surgeon: Rubie Maid, MD;  Location: [facility];  Service: Gynecology;  Laterality: N/A;  . IUD REMOVAL N/A 05/05/2017   Procedure:  INTRAUTERINE DEVICE (IUD) REMOVAL;  Surgeon: Rubie Maid, MD;  Location: [facility];  Service: Gynecology;  Laterality: N/A;  . LAPAROSCOPIC SALPINGO OOPHERECTOMY Bilateral 05/05/2017   Procedure: LAPAROSCOPIC BILATERAL SALPINGO OOPHORECTOMY;  Surgeon: Rubie Maid, MD;  Location: [facility];  Service: Gynecology;  Laterality: Bilateral;  . LAPAROSCOPY N/A 04/11/2017   Procedure: LAPAROSCOPY DIAGNOSTIC;  Surgeon: Rubie Maid, MD;  Location: [facility];  Service: Gynecology;  Laterality: N/A;  . PARTIAL HYSTERECTOMY     Ovaries and Tubes  . TUBAL LIGATION     Family History  Problem Relation Age of Onset  . Schizophrenia Mother   . Bipolar disorder Mother   . Alcohol abuse Father   . Hypertension Father   . Seizures Paternal Grandmother   . Diabetes Paternal Grandmother   . Hypertension Brother   . Hypertension Maternal Grandmother     Allergies: Flonase [fluticasone propionate]; Sulfa antibiotics; and Tussionex pennkinetic er [hydrocod polst-cpm polst er] Current Outpatient Medications on File Prior to Visit  Medication Sig Dispense Refill  . amLODipine (NORVASC) 2.5 MG tablet Take 1 tablet (2.5 mg total) by mouth daily. 90 tablet 3  . aspirin-acetaminophen-caffeine (EXCEDRIN MIGRAINE) 250-250-65 MG tablet Take 2 tablets by mouth 3 (three) times daily as needed for headache or migraine.     . baclofen (LIORESAL) 10 MG tablet Take 1 tablet (10 mg total) by mouth 3 (three) times daily. 30 each 2  . cetirizine (ZYRTEC) 10 MG tablet  Take 10 mg by mouth daily.    . diphenhydrAMINE (BENADRYL) 25 MG tablet Take 25 mg by mouth at bedtime.    . Eflornithine HCl 13.9 % cream Apply 1 ampule topically 2 (two) times daily with a meal. 45 g 3  . ibuprofen (ADVIL,MOTRIN) 800 MG tablet Take 1 tablet (800 mg total) by mouth every 8 (eight) hours as needed. 60 tablet 1  . latanoprost (XALATAN) 0.005 % ophthalmic solution Place 1 drop into both eyes at bedtime.    . Melatonin 10 MG TABS Take 10 mg by mouth  at bedtime.    Marland Kitchen PARoxetine (PAXIL) 20 MG tablet Take 1 tablet (20 mg total) by mouth daily. 30 tablet 11  . promethazine (PHENERGAN) 25 MG tablet Take 1 tablet (25 mg total) by mouth every 8 (eight) hours as needed for nausea or vomiting. 20 tablet 0  . propranolol (INDERAL) 40 MG tablet Take 1 tablet (40 mg total) by mouth 2 (two) times daily. 120 tablet 1  . simethicone (GAS-X) 80 MG chewable tablet Chew 1 tablet (80 mg total) by mouth every 6 (six) hours as needed for flatulence. For gas and bloating. 30 tablet 0  . SUMAtriptan (IMITREX) 100 MG tablet May repeat in 2 hours if headache persists or recurs. 10 tablet 6  . testosterone cypionate (DEPO-TESTOSTERONE) 100 MG/ML injection Inject 0.5 mLs (50 mg total) into the muscle every 28 (twenty-eight) days. For IM use only 10 mL 1  . timolol (BETIMOL) 0.25 % ophthalmic solution Place 1 drop into both eyes daily.     Current Facility-Administered Medications on File Prior to Visit  Medication Dose Route Frequency Provider Last Rate Last Dose  . testosterone cypionate (DEPOTESTOSTERONE CYPIONATE) injection 200 mg  200 mg Intramuscular Q28 days Rubie Maid, MD   200 mg at 01/03/18 1135  . testosterone cypionate (DEPOTESTOSTERONE CYPIONATE) injection 200 mg  200 mg Intramuscular Q28 days Rubie Maid, MD        Social History   Tobacco Use  . Smoking status: Former Smoker    Packs/day: 1.00    Years: 6.00    Pack years: 6.00    Types: Cigarettes    Last attempt to quit: 05/29/2000    Years since quitting: 17.7  . Smokeless tobacco: Never Used  . Tobacco comment: 1/2-1 PPD  Substance Use Topics  . Alcohol use: No    Alcohol/week: 0.0 oz  . Drug use: No    Review of Systems  Constitutional: Negative for chills and fever.  HENT: Positive for congestion and ear pain. Negative for ear discharge.   Respiratory: Negative for cough, shortness of breath and wheezing.   Cardiovascular: Negative for chest pain and palpitations.    Gastrointestinal: Negative for nausea and vomiting.  Skin: Negative for color change and wound.  Neurological: Positive for headaches. Negative for dizziness, syncope and weakness.      Objective:    BP (!) 130/96 (BP Location: Left Arm, Patient Position: Sitting, Cuff Size: Normal)   Pulse 61   Temp 99.1 F (37.3 C) (Oral)   Resp 16   Wt 213 lb 6 oz (96.8 kg)   LMP  (LMP Unknown)   SpO2 98%   BMI 39.03 kg/m  BP Readings from Last 3 Encounters:  03/01/18 (!) 130/96  02/22/18 (!) 142/84  02/08/18 (!) 152/100   Wt Readings from Last 3 Encounters:  03/01/18 213 lb 6 oz (96.8 kg)  02/22/18 211 lb 4.8 oz (95.8 kg)  02/08/18 211 lb  9.6 oz (96 kg)    Physical Exam  Constitutional: She appears well-developed and well-nourished.  HENT:  Head: Normocephalic and atraumatic.  Right Ear: Hearing, tympanic membrane, external ear and ear canal normal. No drainage, swelling or tenderness. No foreign bodies. Tympanic membrane is not erythematous and not bulging. No middle ear effusion. No decreased hearing is noted.  Left Ear: Hearing, tympanic membrane, external ear and ear canal normal. No drainage, swelling or tenderness. No foreign bodies. Tympanic membrane is not erythematous and not bulging.  No middle ear effusion. No decreased hearing is noted.  Nose: Nose normal. No rhinorrhea. Right sinus exhibits no maxillary sinus tenderness and no frontal sinus tenderness. Left sinus exhibits no maxillary sinus tenderness and no frontal sinus tenderness.  Mouth/Throat: Uvula is midline, oropharynx is clear and moist and mucous membranes are normal. No oropharyngeal exudate, posterior oropharyngeal edema, posterior oropharyngeal erythema or tonsillar abscesses.  Eyes: Conjunctivae are normal.  Cardiovascular: Regular rhythm, normal heart sounds and normal pulses.  Pulmonary/Chest: Effort normal and breath sounds normal. She has no wheezes. She has no rhonchi. She has no rales.  Lymphadenopathy:        Head (right side): No submental, no submandibular, no tonsillar, no preauricular, no posterior auricular and no occipital adenopathy present.       Head (left side): No submental, no submandibular, no tonsillar, no preauricular, no posterior auricular and no occipital adenopathy present.    She has no cervical adenopathy.  Neurological: She is alert.  Skin: Skin is warm and dry.     Skin colored pedunculated lesion.   Psychiatric: She has a normal mood and affect. Her speech is normal and behavior is normal. Thought content normal.  Vitals reviewed.      Assessment & Plan:   Problem List Items Addressed This Visit      Cardiovascular and Mediastinum   Hypertension - Primary    Continues to be elevated. Advised to stop decongestants. Will stay at current dose of amlodipine since not quite a week.  Discussed with patient at  Length that my priority goal is to get blood pressure at goal;  suspect this may be contributory to headaches.  At that point if headaches persist, I still want her to take less Excedrin.  I am pleased that she is taking significant less amount however we cannot increase the propranolol based on her HR 61 today ( Prior HR 96) . Will continue to monitor. Close followup.          Nervous and Auditory   Non-recurrent acute serous otitis media of right ear    Reassured by benign HEENT exam.  Due to duration of symptoms and sharp nature of pain, I went ahead and gave patient a prescription of amoxicillin .  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 Referral to dermatology for further evaluation, possibly removal.      Relevant Orders   Ambulatory referral to Dermatology     Other   Nonintractable headache    Reassured as improved on  propranolol and headache similar to headaches in the past.  See note on hypertension.  Patient taking less Excedrin which I am very happy to see.  Will focus on blood pressure as  suspect contributory.  Will follow.       Other Visit Diagnoses    Yeast infection       Relevant Medications   fluconazole (DIFLUCAN) 150 MG tablet       I am having Tashawnda E. Brownfield start on amoxicillin and fluconazole. I am also having her maintain her cetirizine, diphenhydrAMINE, latanoprost, timolol, aspirin-acetaminophen-caffeine, ibuprofen, simethicone, Melatonin, baclofen, promethazine, SUMAtriptan, PARoxetine, testosterone cypionate, propranolol, Eflornithine HCl, and amLODipine. We will continue to administer testosterone cypionate and testosterone cypionate.   Meds ordered this encounter  Medications  . amoxicillin (AMOXIL) 500 MG capsule    Sig: Take 1 capsule (500 mg total) by mouth 2 (two) times daily.    Dispense:  14 capsule    Refill:  0    Order Specific Question:   Supervising Provider    Answer:   Deborra Medina L [[id number]]  . fluconazole (DIFLUCAN) 150 MG tablet    Sig: Take 1 tablet (150 mg total) by mouth once for 1 dose. Take one tablet PO once. If continue to have symptoms, may take one tablet PO 3 days later.    Dispense:  2 tablet    Refill:  1    Order Specific Question:   Supervising Provider    Answer:   Crecencio Mc [[id number]]    Return precautions given.   Risks, benefits, and alternatives of the medications and treatment plan prescribed today were discussed, and patient expressed understanding.   Education regarding symptom management and diagnosis given to patient on AVS.  Continue to follow with Burnard Hawthorne, FNP for routine health maintenance.   [name] and I agreed with plan.   Mable Paris, FNP

## 2018-03-01 NOTE — Assessment & Plan Note (Addendum)
Appears to be Acrochordan. Referral to dermatology for further evaluation, possibly removal.

## 2018-03-03 ENCOUNTER — Ambulatory Visit (INDEPENDENT_AMBULATORY_CARE_PROVIDER_SITE_OTHER): Payer: Managed Care, Other (non HMO) | Admitting: Obstetrics and Gynecology

## 2018-03-03 ENCOUNTER — Encounter (INDEPENDENT_AMBULATORY_CARE_PROVIDER_SITE_OTHER): Payer: Self-pay

## 2018-03-03 VITALS — BP 108/71 | HR 58 | Ht 62.0 in | Wt 209.7 lb

## 2018-03-03 DIAGNOSIS — R6882 Decreased libido: Secondary | ICD-10-CM

## 2018-03-03 MED ORDER — TESTOSTERONE CYPIONATE 200 MG/ML IM SOLN
200.0000 mg | INTRAMUSCULAR | Status: DC
  Administered 2018-03-03 – 2018-03-27 (×2): 200 mg via INTRAMUSCULAR

## 2018-03-03 NOTE — Progress Notes (Signed)
BP 108/71   Pulse (!) 58   Ht 5\' 2"  (1.575 m)   Wt 209 lb 11.2 oz (95.1 kg)   LMP  (LMP Unknown)   BMI 38.35 kg/m

## 2018-03-13 ENCOUNTER — Ambulatory Visit: Payer: Self-pay | Admitting: Internal Medicine

## 2018-03-21 ENCOUNTER — Encounter: Payer: Self-pay | Admitting: Family

## 2018-03-24 ENCOUNTER — Other Ambulatory Visit: Payer: Self-pay | Admitting: Family

## 2018-03-24 ENCOUNTER — Encounter: Payer: Self-pay | Admitting: Family

## 2018-03-24 DIAGNOSIS — I10 Essential (primary) hypertension: Secondary | ICD-10-CM

## 2018-03-24 MED ORDER — AMLODIPINE BESYLATE 2.5 MG PO TABS: 2.5000 mg | ORAL_TABLET | Freq: Every day | ORAL | 3 refills | Status: DC

## 2018-03-24 NOTE — Progress Notes (Signed)
rx sent

## 2018-03-27 ENCOUNTER — Ambulatory Visit (INDEPENDENT_AMBULATORY_CARE_PROVIDER_SITE_OTHER): Payer: Managed Care, Other (non HMO) | Admitting: Obstetrics and Gynecology

## 2018-03-27 VITALS — BP 137/87 | HR 53 | Wt 215.5 lb

## 2018-03-27 DIAGNOSIS — R6882 Decreased libido: Secondary | ICD-10-CM | POA: Diagnosis not present

## 2018-03-27 MED ORDER — TESTOSTERONE CYPIONATE 200 MG/ML IM SOLN
200.0000 mg | Freq: Once | INTRAMUSCULAR | Status: AC
  Administered 2018-03-27: 200 mg via INTRAMUSCULAR

## 2018-03-27 NOTE — Progress Notes (Signed)
Pt is here for a NV for testerone inj.

## 2018-03-29 ENCOUNTER — Encounter: Payer: Self-pay | Admitting: Obstetrics and Gynecology

## 2018-03-31 ENCOUNTER — Ambulatory Visit: Payer: Managed Care, Other (non HMO) | Admitting: Family

## 2018-03-31 ENCOUNTER — Encounter: Payer: Self-pay | Admitting: Family

## 2018-03-31 ENCOUNTER — Ambulatory Visit: Payer: Managed Care, Other (non HMO)

## 2018-03-31 VITALS — BP 130/84 | HR 59 | Temp 98.1°F | Resp 16 | Wt 213.2 lb

## 2018-03-31 DIAGNOSIS — R51 Headache: Secondary | ICD-10-CM | POA: Diagnosis not present

## 2018-03-31 DIAGNOSIS — K219 Gastro-esophageal reflux disease without esophagitis: Secondary | ICD-10-CM

## 2018-03-31 DIAGNOSIS — I10 Essential (primary) hypertension: Secondary | ICD-10-CM | POA: Diagnosis not present

## 2018-03-31 DIAGNOSIS — R519 Headache, unspecified: Secondary | ICD-10-CM

## 2018-03-31 MED ORDER — RANITIDINE HCL 150 MG PO TABS: 150.0000 mg | ORAL_TABLET | Freq: Two times a day (BID) | ORAL | 2 refills | Status: DC

## 2018-03-31 MED ORDER — AMLODIPINE BESYLATE 5 MG PO TABS: 5.0000 mg | ORAL_TABLET | Freq: Every day | ORAL | 1 refills | Status: DC

## 2018-03-31 NOTE — Assessment & Plan Note (Signed)
Controlled on Zantac.  Severe symptoms when off H2 B, referral to GI as patient is due for colonoscopy this year, also advised she should have an EGD.  Patient aware of this.  Prescription given for Zantac and discussed long-term use of medication such as PPIs.  Will follow

## 2018-03-31 NOTE — Assessment & Plan Note (Signed)
Pleased as frequency, intensity has improved.  However patient is still getting a headache every couple of days.  We will consult neurology.  Referral placed.

## 2018-03-31 NOTE — Progress Notes (Signed)
Subjective:    Patient ID: [name], female    DOB: 19-Jun-1966, 52 y.o.   MRN: [id number]  CC: Yvonne [name] is a 52 y.o. female who presents today for follow up.   HPI: HTN- amlodipine 2.5mg . 138/79.  Taking less excredin.  Denies exertional chest pain or pressure, numbness or tingling radiating to left arm or jaw, palpitations, dizziness, frequent changes in vision, or shortness of breath.   HA- on propranolol, gets a HA one every 3 days. Thinks blood pressure improving has helped with HA. Notes HA had been more intense.  Has used imitrex once per week.   Right ear pain resolved.   GERD- using zantac daily with resolve. No hoarseness, trouble swallowing.  Former smoker - 17 years ago. Would like rx for zantac       HISTORY:  Past Medical History:  Diagnosis Date  . Allergy   . GERD (gastroesophageal reflux disease)   . Gestational diabetes    patient denies  . Glaucoma   . H/O bronchitis   . Headache    migraine  . History of ovarian cyst   . Hypertension    pre-eclampsia with first child  . Increased pressure in the eye, bilateral    Past Surgical History:  Procedure Laterality Date  . CERVICAL POLYPECTOMY N/A 04/11/2017   Procedure: CERVICAL POLYPECTOMY;  Surgeon: Rubie Maid, MD;  Location: [facility];  Service: Gynecology;  Laterality: N/A;  . HYSTEROSCOPY W/D&C N/A 04/11/2017   Procedure: DILATATION AND CURETTAGE /HYSTEROSCOPY;  Surgeon: Rubie Maid, MD;  Location: [facility];  Service: Gynecology;  Laterality: N/A;  . IUD REMOVAL N/A 05/05/2017   Procedure: INTRAUTERINE DEVICE (IUD) REMOVAL;  Surgeon: Rubie Maid, MD;  Location: [facility];  Service: Gynecology;  Laterality: N/A;  . LAPAROSCOPIC SALPINGO OOPHERECTOMY Bilateral 05/05/2017   Procedure: LAPAROSCOPIC BILATERAL SALPINGO OOPHORECTOMY;  Surgeon: Rubie Maid, MD;  Location: [facility];  Service: Gynecology;  Laterality: Bilateral;  . LAPAROSCOPY N/A 04/11/2017   Procedure: LAPAROSCOPY DIAGNOSTIC;   Surgeon: Rubie Maid, MD;  Location: [facility];  Service: Gynecology;  Laterality: N/A;  . PARTIAL HYSTERECTOMY     Ovaries and Tubes  . TUBAL LIGATION     Family History  Problem Relation Age of Onset  . Schizophrenia Mother   . Bipolar disorder Mother   . Alcohol abuse Father   . Hypertension Father   . Seizures Paternal Grandmother   . Diabetes Paternal Grandmother   . Hypertension Brother   . Hypertension Maternal Grandmother     Allergies: Flonase [fluticasone propionate]; Sulfa antibiotics; and Tussionex pennkinetic er [hydrocod polst-cpm polst er] Current Outpatient Medications on File Prior to Visit  Medication Sig Dispense Refill  . aspirin-acetaminophen-caffeine (EXCEDRIN MIGRAINE) 250-250-65 MG tablet Take 2 tablets by mouth 3 (three) times daily as needed for headache or migraine.     . baclofen (LIORESAL) 10 MG tablet Take 1 tablet (10 mg total) by mouth 3 (three) times daily. 30 each 2  . cetirizine (ZYRTEC) 10 MG tablet Take 10 mg by mouth daily.    . diphenhydrAMINE (BENADRYL) 25 MG tablet Take 25 mg by mouth at bedtime.    Marland Kitchen ibuprofen (ADVIL,MOTRIN) 800 MG tablet Take 1 tablet (800 mg total) by mouth every 8 (eight) hours as needed. 60 tablet 1  . latanoprost (XALATAN) 0.005 % ophthalmic solution Place 1 drop into both eyes at bedtime.    . Melatonin 10 MG TABS Take 10 mg by mouth at bedtime.    Marland Kitchen  PARoxetine (PAXIL) 20 MG tablet Take 1 tablet (20 mg total) by mouth daily. 30 tablet 11  . promethazine (PHENERGAN) 25 MG tablet Take 1 tablet (25 mg total) by mouth every 8 (eight) hours as needed for nausea or vomiting. 20 tablet 0  . propranolol (INDERAL) 40 MG tablet Take 1 tablet (40 mg total) by mouth 2 (two) times daily. 120 tablet 1  . simethicone (GAS-X) 80 MG chewable tablet Chew 1 tablet (80 mg total) by mouth every 6 (six) hours as needed for flatulence. For gas and bloating. 30 tablet 0  . SUMAtriptan (IMITREX) 100 MG tablet May repeat in 2 hours if headache  persists or recurs. 10 tablet 6  . testosterone cypionate (DEPO-TESTOSTERONE) 100 MG/ML injection Inject 0.5 mLs (50 mg total) into the muscle every 28 (twenty-eight) days. For IM use only 10 mL 1  . timolol (BETIMOL) 0.25 % ophthalmic solution Place 1 drop into both eyes daily.     Current Facility-Administered Medications on File Prior to Visit  Medication Dose Route Frequency Provider Last Rate Last Dose  . testosterone cypionate (DEPOTESTOSTERONE CYPIONATE) injection 200 mg  200 mg Intramuscular Q28 days Rubie Maid, MD   200 mg at 01/03/18 1135  . testosterone cypionate (DEPOTESTOSTERONE CYPIONATE) injection 200 mg  200 mg Intramuscular Q28 days Rubie Maid, MD      . testosterone cypionate (DEPOTESTOSTERONE CYPIONATE) injection 200 mg  200 mg Intramuscular Q28 days Rubie Maid, MD   200 mg at 03/27/18 1041    Social History   Tobacco Use  . Smoking status: Former Smoker    Packs/day: 1.00    Years: 6.00    Pack years: 6.00    Types: Cigarettes    Last attempt to quit: 05/29/2000    Years since quitting: 17.8  . Smokeless tobacco: Never Used  . Tobacco comment: 1/2-1 PPD  Substance Use Topics  . Alcohol use: No    Alcohol/week: 0.0 oz  . Drug use: No    Review of Systems  Constitutional: Negative for chills and fever.  HENT: Negative for trouble swallowing and voice change.   Eyes: Negative for visual disturbance.  Respiratory: Negative for cough.   Cardiovascular: Negative for chest pain and palpitations.  Gastrointestinal: Negative for nausea and vomiting.  Neurological: Positive for headaches. Negative for dizziness.      Objective:    BP 130/84 (BP Location: Left Arm, Patient Position: Sitting, Cuff Size: Large)   Pulse (!) 59   Temp 98.1 F (36.7 C) (Oral)   Resp 16   Wt 213 lb 4 oz (96.7 kg)   LMP  (LMP Unknown)   SpO2 98%   BMI 39.00 kg/m  BP Readings from Last 3 Encounters:  03/31/18 130/84  03/27/18 137/87  03/03/18 108/71   Wt Readings from  Last 3 Encounters:  03/31/18 213 lb 4 oz (96.7 kg)  03/27/18 215 lb 8 oz (97.8 kg)  03/03/18 209 lb 11.2 oz (95.1 kg)    Physical Exam  Constitutional: She appears well-developed and well-nourished.  Eyes: Conjunctivae are normal.  Cardiovascular: Normal rate, regular rhythm, normal heart sounds and normal pulses.  Pulmonary/Chest: Effort normal and breath sounds normal. She has no wheezes. She has no rhonchi. She has no rales.  Neurological: She is alert.  Skin: Skin is warm and dry.  Psychiatric: She has a normal mood and affect. Her speech is normal and behavior is normal. Thought content normal.  Vitals reviewed.      Assessment & Plan:  Problem List Items Addressed This Visit      Cardiovascular and Mediastinum   Hypertension - Primary    Improved however not quite at goal.  Will increase amlodipine to 5 mg.  We will also see if this continues to help with headaches.  Patient will let me know how blood pressures are from home      Relevant Medications   amLODipine (NORVASC) 5 MG tablet     Digestive   GERD (gastroesophageal reflux disease)    Controlled on Zantac.  Severe symptoms when off H2 B, referral to GI as patient is due for colonoscopy this year, also advised she should have an EGD.  Patient aware of this.  Prescription given for Zantac and discussed long-term use of medication such as PPIs.  Will follow      Relevant Medications   ranitidine (ZANTAC) 150 MG tablet   Other Relevant Orders   Ambulatory referral to Gastroenterology     Other   Nonintractable headache    Pleased as frequency, intensity has improved.  However patient is still getting a headache every couple of days.  We will consult neurology.  Referral placed.      Relevant Medications   amLODipine (NORVASC) 5 MG tablet   Other Relevant Orders   Ambulatory referral to Neurology       I have discontinued Kyanne E. Pecore's Eflornithine HCl and amoxicillin. I have also changed her  amLODipine. Additionally, I am having her start on ranitidine. Lastly, I am having her maintain her cetirizine, diphenhydrAMINE, latanoprost, timolol, aspirin-acetaminophen-caffeine, ibuprofen, simethicone, Melatonin, baclofen, promethazine, SUMAtriptan, PARoxetine, testosterone cypionate, and propranolol. We will continue to administer testosterone cypionate, testosterone cypionate, and testosterone cypionate.   Meds ordered this encounter  Medications  . ranitidine (ZANTAC) 150 MG tablet    Sig: Take 1 tablet (150 mg total) by mouth 2 (two) times daily for 7 days.    Dispense:  120 tablet    Refill:  2    Order Specific Question:   Supervising Provider    Answer:   Deborra Medina L [[id number]]  . amLODipine (NORVASC) 5 MG tablet    Sig: Take 1 tablet (5 mg total) by mouth daily.    Dispense:  90 tablet    Refill:  1    Order Specific Question:   Supervising Provider    Answer:   Crecencio Mc [[id number]]    Return precautions given.   Risks, benefits, and alternatives of the medications and treatment plan prescribed today were discussed, and patient expressed understanding.   Education regarding symptom management and diagnosis given to patient on AVS.  Continue to follow with Burnard Hawthorne, FNP for routine health maintenance.   [name] and I agreed with plan.   Mable Paris, FNP

## 2018-03-31 NOTE — Assessment & Plan Note (Signed)
Improved however not quite at goal.  Will increase amlodipine to 5 mg.  We will also see if this continues to help with headaches.  Patient will let me know how blood pressures are from home

## 2018-03-31 NOTE — Patient Instructions (Addendum)
Increase amlodipine to 5mg  daily. Let me know if headaches improve  Monitor blood pressure,  Goal is less than 130/80; if persistently higher, please make sooner follow up appointment so we can recheck you blood pressure and manage medications  Long term use beyond 3 months of proton pump inhibitors , also called PPI's, is associated with malabsorption of vitamins, chronic kidney disease, fracture risk, and diarrheal illnesses. PPI's include Nexium, Prilosec, Protonix, Dexilant, and Prevacid.   I generally recommend trying to control acid reflux with lifestyle modifications including avoiding trigger foods, not eating 2 hours prior to bedtime. You may use histamine 2 blockers daily to twice daily ( this is Zantac, Pepcid) and then when symptoms flare, start back on PPI for short course.   Of note, I have referred you to GI for  an endoscopy ( upper GI) to evaluate your esophagus, stomach. Red flag symptoms: trouble swallowing, hoarseness, chronic cough, unexplained weight loss.   Today we discussed referrals, orders. NEUROLOGY, GI   I have placed these orders in the system for you.  Please be sure to give [place] a call if you have not heard from our office regarding scheduling a test or regarding referral in a timely manner.  It is very important that you let me know as soon as possible.

## 2018-04-02 ENCOUNTER — Encounter: Payer: Self-pay | Admitting: Obstetrics and Gynecology

## 2018-04-07 ENCOUNTER — Encounter: Payer: Self-pay | Admitting: Gastroenterology

## 2018-04-12 ENCOUNTER — Ambulatory Visit: Payer: Self-pay | Admitting: Family

## 2018-04-18 ENCOUNTER — Other Ambulatory Visit: Payer: Self-pay | Admitting: Family

## 2018-04-18 DIAGNOSIS — M62838 Other muscle spasm: Secondary | ICD-10-CM

## 2018-04-18 NOTE — Telephone Encounter (Signed)
Prescribed by Ashok Cordia , PA previously

## 2018-04-18 NOTE — Telephone Encounter (Signed)
Copied from [place] [id number]. Topic: Quick Communication - Rx Refill/Question >> Apr 18, 2018  2:07 PM Margot Ables wrote: Medication: baclofen (LIORESAL) 10 MG tablet - pt uses for back pain - she said she uses as needed - [facility] Has the patient contacted their pharmacy? Yes - no refills - change in provider Preferred Pharmacy (with phone number or street name): [place], [place] - [place] [phone] (Phone) [phone] (Fax)

## 2018-04-19 MED ORDER — BACLOFEN 5 MG PO TABS: 5.0000 mg | ORAL_TABLET | Freq: Three times a day (TID) | ORAL | 0 refills | Status: DC

## 2018-04-19 NOTE — Telephone Encounter (Signed)
Call pt Sent in 5mg  tablet  Advise the below  Do not drive or operate heavy machinery while on muscle relaxant. Please do not drink alcohol. Only take this medication as needed for acute muscle spasm at bedtime. This medication make you feel drowsy so be very careful.  Stop taking if become too drowsy or somnolent as this puts you at risk for falls. Please contact our office with any questions.

## 2018-04-19 NOTE — Telephone Encounter (Signed)
Left voice mail for patient to call back ok for [facility] to speak to patient    

## 2018-04-19 NOTE — Telephone Encounter (Signed)
Pt called back- message from [place] given to pt. Pt verbalized understanding.

## 2018-05-03 ENCOUNTER — Other Ambulatory Visit: Payer: Self-pay

## 2018-05-03 DIAGNOSIS — L700 Acne vulgaris: Secondary | ICD-10-CM

## 2018-05-04 ENCOUNTER — Encounter: Payer: Self-pay | Admitting: Family

## 2018-05-04 LAB — CMP12+LP+TP+TSH+6AC+CBC/D/PLT
A/G RATIO: 2 (ref 1.2–2.2)
ALK PHOS: 101 IU/L (ref 39–117)
ALT: 19 IU/L (ref 0–32)
AST: 15 IU/L (ref 0–40)
Albumin: 4.5 g/dL (ref 3.5–5.5)
BASOS ABS: 0 10*3/uL (ref 0.0–0.2)
BILIRUBIN TOTAL: 0.4 mg/dL (ref 0.0–1.2)
BUN / CREAT RATIO: 38 — AB (ref 9–23)
BUN: 21 mg/dL (ref 6–24)
Basos: 1 %
CHOL/HDL RATIO: 6 ratio — AB (ref 0.0–4.4)
CREATININE: 0.55 mg/dL — AB (ref 0.57–1.00)
Calcium: 10.2 mg/dL (ref 8.7–10.2)
Chloride: 103 mmol/L (ref 96–106)
Cholesterol, Total: 251 mg/dL — ABNORMAL HIGH (ref 100–199)
EOS (ABSOLUTE): 0.1 10*3/uL (ref 0.0–0.4)
EOS: 1 %
Estimated CHD Risk: 1.7 times avg. — ABNORMAL HIGH (ref 0.0–1.0)
Free Thyroxine Index: 1.9 (ref 1.2–4.9)
GFR, EST AFRICAN AMERICAN: 127 mL/min/{1.73_m2} (ref >59)
GFR, EST NON AFRICAN AMERICAN: 110 mL/min/{1.73_m2} (ref >59)
GGT: 32 IU/L (ref 0–60)
GLUCOSE: 102 mg/dL — AB (ref 65–99)
Globulin, Total: 2.2 g/dL (ref 1.5–4.5)
HDL: 42 mg/dL (ref >39)
HEMOGLOBIN: 15.3 g/dL (ref 11.1–15.9)
Hematocrit: 44.9 % (ref 34.0–46.6)
Immature Grans (Abs): 0 10*3/uL (ref 0.0–0.1)
Immature Granulocytes: 0 %
Iron: 187 ug/dL — ABNORMAL HIGH (ref 27–159)
LDH: 189 IU/L (ref 119–226)
LDL Calculated: 166 mg/dL — ABNORMAL HIGH (ref 0–99)
LYMPHS: 35 %
Lymphocytes Absolute: 1.3 10*3/uL (ref 0.7–3.1)
MCH: 30.5 pg (ref 26.6–33.0)
MCHC: 34.1 g/dL (ref 31.5–35.7)
MCV: 90 fL (ref 79–97)
MONOCYTES: 12 %
Monocytes Absolute: 0.4 10*3/uL (ref 0.1–0.9)
NEUTROS ABS: 1.9 10*3/uL (ref 1.4–7.0)
Neutrophils: 51 %
PLATELETS: 227 10*3/uL (ref 150–450)
Phosphorus: 4.1 mg/dL (ref 2.5–4.5)
Potassium: 4.8 mmol/L (ref 3.5–5.2)
RBC: 5.01 x10E6/uL (ref 3.77–5.28)
RDW: 13.9 % (ref 12.3–15.4)
Sodium: 144 mmol/L (ref 134–144)
T3 Uptake Ratio: 23 % — ABNORMAL LOW (ref 24–39)
T4, Total: 8.1 ug/dL (ref 4.5–12.0)
TSH: 1.11 u[IU]/mL (ref 0.450–4.500)
Total Protein: 6.7 g/dL (ref 6.0–8.5)
Triglycerides: 214 mg/dL — ABNORMAL HIGH (ref 0–149)
URIC ACID: 4.7 mg/dL (ref 2.5–7.1)
VLDL CHOLESTEROL CAL: 43 mg/dL — AB (ref 5–40)
WBC: 3.7 10*3/uL (ref 3.4–10.8)

## 2018-05-05 ENCOUNTER — Ambulatory Visit (INDEPENDENT_AMBULATORY_CARE_PROVIDER_SITE_OTHER): Payer: Managed Care, Other (non HMO) | Admitting: Obstetrics and Gynecology

## 2018-05-05 VITALS — BP 115/73 | HR 53 | Ht 62.0 in | Wt 218.8 lb

## 2018-05-05 DIAGNOSIS — L678 Other hair color and hair shaft abnormalities: Secondary | ICD-10-CM | POA: Diagnosis not present

## 2018-05-05 DIAGNOSIS — R6882 Decreased libido: Secondary | ICD-10-CM

## 2018-05-05 MED ORDER — TESTOSTERONE CYPIONATE 200 MG/ML IM SOLN
50.0000 mg | INTRAMUSCULAR | Status: DC
  Administered 2018-05-05: 50 mg via INTRAMUSCULAR

## 2018-05-05 NOTE — Progress Notes (Signed)
Pt presents for 3rd testosterone inj. Per [facility] she is to receive 50mg  (0.68ml) inj. Pt states her sex drive as increased. Her only complaint is the facial hair. Pt to f/u with [facility] next week.

## 2018-05-05 NOTE — Progress Notes (Signed)
I have reviewed the record and concur with patient management and plan.  Burtis Imhoff, MD [facility]     

## 2018-05-10 ENCOUNTER — Ambulatory Visit: Payer: Self-pay | Admitting: Family Medicine

## 2018-05-10 VITALS — BP 140/87 | HR 62 | Temp 98.2°F | Resp 16

## 2018-05-10 DIAGNOSIS — J0100 Acute maxillary sinusitis, unspecified: Secondary | ICD-10-CM

## 2018-05-10 DIAGNOSIS — R059 Cough, unspecified: Secondary | ICD-10-CM

## 2018-05-10 DIAGNOSIS — Z8619 Personal history of other infectious and parasitic diseases: Secondary | ICD-10-CM

## 2018-05-10 DIAGNOSIS — J029 Acute pharyngitis, unspecified: Secondary | ICD-10-CM

## 2018-05-10 DIAGNOSIS — R05 Cough: Secondary | ICD-10-CM

## 2018-05-10 MED ORDER — AMOXICILLIN-POT CLAVULANATE 875-125 MG PO TABS: 1.0000 | ORAL_TABLET | Freq: Two times a day (BID) | ORAL | 0 refills | Status: AC

## 2018-05-10 MED ORDER — BENZONATATE 200 MG PO CAPS: 200.0000 mg | ORAL_CAPSULE | Freq: Every evening | ORAL | 0 refills | Status: DC | PRN

## 2018-05-10 MED ORDER — TERCONAZOLE 0.8 % VA CREA: 1.0000 | TOPICAL_CREAM | Freq: Every day | VAGINAL | 0 refills | Status: AC

## 2018-05-10 NOTE — Progress Notes (Signed)
Subjective: congestion     Yvonne [name] is a 52 y.o. female who presents for evaluation of purulent nasal congestion/drainage, productive cough with yellow/green sputum, low-grade fever, fatigue, chills, mild sore throat, and facial pressure for 3 days.  Patient describes significant facial pressure overlying her bilateral maxillary sinuses. Patient reports running a low-grade fever yesterday, T-max 99.9.  Patient believes that her symptoms have worsened since the onset.  Patient reports a history of allergic rhinitis, which she takes Zyrtec for. Treatment to date: Nasal saline spray once a day, DayQuil, and NyQuil.  Denies rash, nausea, vomiting, diarrhea, SOB, wheezing, chest or back pain, ear pain, difficulty swallowing, confusion, headache, or body aches. History of smoking, asthma, COPD: Negative.  Quit smoking approximately 17 years ago. History of recurrent sinus and/or lung infections: Approximately 2 sinus infections a year, otherwise negative.  Review of Systems Pertinent items noted in HPI and remainder of comprehensive ROS otherwise negative.     Objective:   Physical Exam General: Awake, alert, and oriented. No acute distress. Well developed, hydrated and nourished. Appears stated age. Nontoxic appearance.  HEENT:  PND noted.  No erythema to posterior oropharynx.  No edema or exudates of pharynx or tonsils. No erythema or bulging of TM.  Mild erythema/edema to nasal mucosa.  Bilateral maxillary sinus tenderness.  Remainder of sinuses nontender. Supple neck without adenopathy. Cardiac: Heart rate and rhythm are normal. No murmurs, gallops, or rubs are auscultated. S1 and S2 are heard and are of normal intensity.  Respiratory: No signs of respiratory distress. Lungs clear. No tachypnea. Able to speak in full sentences without dyspnea. Nonlabored respirations.  Skin: Skin is warm, dry and intact.  Normal skin turgor.  Appropriate color for ethnicity. No cyanosis noted.   Assessment:     sinusitis   Plan:    Discussed the diagnosis and treatment of sinusitis. Suggested symptomatic OTC remedies. Nasal saline spray for congestion.   Prescribed Augmentin, patient has taken this before and tolerated it well. Patient reports a history of Candida vulvovaginitis with antibiotics use and is requesting medication.  Prescribed Terazol cream and advised her to take this upon completion of her antibiotics if symptoms develop. Prescribed Tessalon Perles to use as needed at night for cough to help the patient sleep.  Discussed side/adverse effects of all medications prescribed. Follow-up with primary care provider. Discussed red flag symptoms and circumstances with which to seek medical care.   New Prescriptions   AMOXICILLIN-CLAVULANATE (AUGMENTIN) 875-125 MG TABLET    Take 1 tablet by mouth 2 (two) times daily for 10 days.   BENZONATATE (TESSALON) 200 MG CAPSULE    Take 1 capsule (200 mg total) by mouth at bedtime as needed for cough.   TERCONAZOLE (TERAZOL 3) 0.8 % VAGINAL CREAM    Place 1 applicator vaginally at bedtime for 3 days.  l

## 2018-05-12 ENCOUNTER — Encounter: Payer: Self-pay | Admitting: Family

## 2018-05-16 ENCOUNTER — Other Ambulatory Visit: Payer: Self-pay | Admitting: Family

## 2018-05-16 DIAGNOSIS — M62838 Other muscle spasm: Secondary | ICD-10-CM

## 2018-05-17 ENCOUNTER — Ambulatory Visit (INDEPENDENT_AMBULATORY_CARE_PROVIDER_SITE_OTHER): Payer: Managed Care, Other (non HMO) | Admitting: Family

## 2018-05-17 ENCOUNTER — Encounter: Payer: Self-pay | Admitting: Family

## 2018-05-17 ENCOUNTER — Encounter: Payer: Managed Care, Other (non HMO) | Admitting: Obstetrics and Gynecology

## 2018-05-17 ENCOUNTER — Encounter: Payer: Self-pay | Admitting: Obstetrics and Gynecology

## 2018-05-17 VITALS — BP 110/76 | HR 59 | Temp 98.1°F | Resp 15 | Wt 218.5 lb

## 2018-05-17 DIAGNOSIS — R519 Headache, unspecified: Secondary | ICD-10-CM

## 2018-05-17 DIAGNOSIS — M62838 Other muscle spasm: Secondary | ICD-10-CM

## 2018-05-17 DIAGNOSIS — K219 Gastro-esophageal reflux disease without esophagitis: Secondary | ICD-10-CM

## 2018-05-17 DIAGNOSIS — R51 Headache: Secondary | ICD-10-CM

## 2018-05-17 DIAGNOSIS — I10 Essential (primary) hypertension: Secondary | ICD-10-CM | POA: Diagnosis not present

## 2018-05-17 MED ORDER — AMLODIPINE BESYLATE 5 MG PO TABS: 5.0000 mg | ORAL_TABLET | Freq: Every day | ORAL | 1 refills | Status: DC

## 2018-05-17 MED ORDER — RANITIDINE HCL 150 MG PO TABS: 150.0000 mg | ORAL_TABLET | Freq: Two times a day (BID) | ORAL | 2 refills | Status: DC

## 2018-05-17 MED ORDER — BACLOFEN 10 MG PO TABS
ORAL_TABLET | ORAL | 0 refills | Status: DC
Start: 2018-05-17 — End: 2018-07-11

## 2018-05-17 NOTE — Progress Notes (Signed)
Subjective:    Patient ID: Yvonne [name], female    DOB: 09-21-1966, 52 y.o.   MRN: [id number]  CC: Yvonne [name] is a 52 y.o. female who presents today for follow up.   HPI: GERD- GI has contacted her, she will call them back  HTN- amlodipine 5mg  . Blood pressure improved 110's at home. No CP, SOB.   HA- has seen neurology; has stopped excredin.   Working on weight loss, eating healthier options.     HISTORY:  Past Medical History:  Diagnosis Date  . Allergy   . GERD (gastroesophageal reflux disease)   . Gestational diabetes    patient denies  . Glaucoma   . H/O bronchitis   . Headache    migraine  . History of ovarian cyst   . Hypertension    pre-eclampsia with first child  . Increased pressure in the eye, bilateral    Past Surgical History:  Procedure Laterality Date  . CERVICAL POLYPECTOMY N/A 04/11/2017   Procedure: CERVICAL POLYPECTOMY;  Surgeon: Rubie Maid, MD;  Location: [facility] ORS;  Service: Gynecology;  Laterality: N/A;  . HYSTEROSCOPY W/D&C N/A 04/11/2017   Procedure: DILATATION AND CURETTAGE /HYSTEROSCOPY;  Surgeon: Rubie Maid, MD;  Location: [facility] ORS;  Service: Gynecology;  Laterality: N/A;  . IUD REMOVAL N/A 05/05/2017   Procedure: INTRAUTERINE DEVICE (IUD) REMOVAL;  Surgeon: Rubie Maid, MD;  Location: [facility] ORS;  Service: Gynecology;  Laterality: N/A;  . LAPAROSCOPIC SALPINGO OOPHERECTOMY Bilateral 05/05/2017   Procedure: LAPAROSCOPIC BILATERAL SALPINGO OOPHORECTOMY;  Surgeon: Rubie Maid, MD;  Location: [facility] ORS;  Service: Gynecology;  Laterality: Bilateral;  . LAPAROSCOPY N/A 04/11/2017   Procedure: LAPAROSCOPY DIAGNOSTIC;  Surgeon: Rubie Maid, MD;  Location: [facility] ORS;  Service: Gynecology;  Laterality: N/A;  . PARTIAL HYSTERECTOMY     Ovaries and Tubes  . TUBAL LIGATION     Family History  Problem Relation Age of Onset  . Schizophrenia Mother   . Bipolar disorder Mother   . Alcohol abuse Father   . Hypertension Father   . Seizures  Paternal Grandmother   . Diabetes Paternal Grandmother   . Hypertension Brother   . Hypertension Maternal Grandmother     Allergies: Flonase [fluticasone propionate]; Sulfa antibiotics; and Tussionex pennkinetic er [hydrocod polst-cpm polst er] Current Outpatient Medications on File Prior to Visit  Medication Sig Dispense Refill  . amoxicillin-clavulanate (AUGMENTIN) 875-125 MG tablet Take 1 tablet by mouth 2 (two) times daily for 10 days. 20 tablet 0  . benzonatate (TESSALON) 200 MG capsule Take 1 capsule (200 mg total) by mouth at bedtime as needed for cough. 20 capsule 0  . cetirizine (ZYRTEC) 10 MG tablet Take 10 mg by mouth daily.    . diphenhydrAMINE (BENADRYL) 25 MG tablet Take 25 mg by mouth at bedtime.    Marland Kitchen ibuprofen (ADVIL,MOTRIN) 800 MG tablet Take 1 tablet (800 mg total) by mouth every 8 (eight) hours as needed. 60 tablet 1  . latanoprost (XALATAN) 0.005 % ophthalmic solution Place 1 drop into both eyes at bedtime.    . Melatonin 10 MG TABS Take 10 mg by mouth at bedtime.    . nortriptyline (PAMELOR) 10 MG capsule Start Nortriptyline (Pamelor) 10 mg nightly for one week, then increase to 20 mg nightly    . PARoxetine (PAXIL) 20 MG tablet Take 1 tablet (20 mg total) by mouth daily. 30 tablet 11  . promethazine (PHENERGAN) 25 MG tablet Take 1 tablet (25 mg total) by mouth every 8 (eight)  hours as needed for nausea or vomiting. 20 tablet 0  . propranolol (INDERAL) 40 MG tablet Take 1 tablet (40 mg total) by mouth 2 (two) times daily. 120 tablet 1  . simethicone (GAS-X) 80 MG chewable tablet Chew 1 tablet (80 mg total) by mouth every 6 (six) hours as needed for flatulence. For gas and bloating. 30 tablet 0  . SUMAtriptan (IMITREX) 100 MG tablet May repeat in 2 hours if headache persists or recurs. 10 tablet 6  . testosterone cypionate (DEPO-TESTOSTERONE) 100 MG/ML injection Inject 0.5 mLs (50 mg total) into the muscle every 28 (twenty-eight) days. For IM use only 10 mL 1  . timolol  (BETIMOL) 0.25 % ophthalmic solution Place 1 drop into both eyes daily.     Current Facility-Administered Medications on File Prior to Visit  Medication Dose Route Frequency Provider Last Rate Last Dose  . testosterone cypionate (DEPOTESTOSTERONE CYPIONATE) injection 200 mg  200 mg Intramuscular Q28 days Rubie Maid, MD   200 mg at 01/03/18 1135  . testosterone cypionate (DEPOTESTOSTERONE CYPIONATE) injection 200 mg  200 mg Intramuscular Q28 days Rubie Maid, MD      . testosterone cypionate (DEPOTESTOSTERONE CYPIONATE) injection 200 mg  200 mg Intramuscular Q28 days Rubie Maid, MD   200 mg at 03/27/18 1041  . testosterone cypionate (DEPOTESTOSTERONE CYPIONATE) injection 50 mg  50 mg Intramuscular Q28 days Rubie Maid, MD   50 mg at 05/05/18 1130    Social History   Tobacco Use  . Smoking status: Former Smoker    Packs/day: 1.00    Years: 6.00    Pack years: 6.00    Types: Cigarettes    Last attempt to quit: 05/29/2000    Years since quitting: 17.9  . Smokeless tobacco: Never Used  . Tobacco comment: 1/2-1 PPD  Substance Use Topics  . Alcohol use: No    Alcohol/week: 0.0 oz  . Drug use: No    Review of Systems  Constitutional: Negative for chills and fever.  Respiratory: Negative for cough.   Cardiovascular: Negative for chest pain and palpitations.  Gastrointestinal: Negative for nausea and vomiting.  Neurological: Positive for headaches (improved).      Objective:    BP 110/76 (BP Location: Left Arm, Patient Position: Sitting, Cuff Size: Large)   Pulse (!) 59   Temp 98.1 F (36.7 C) (Oral)   Resp 15   Wt 218 lb 8 oz (99.1 kg)   LMP  (LMP Unknown)   SpO2 99%   BMI 39.96 kg/m  BP Readings from Last 3 Encounters:  05/17/18 110/76  05/10/18 140/87  05/05/18 115/73   Wt Readings from Last 3 Encounters:  05/17/18 218 lb 8 oz (99.1 kg)  05/05/18 218 lb 12.8 oz (99.2 kg)  03/31/18 213 lb 4 oz (96.7 kg)    Physical Exam  Constitutional: She appears  well-developed and well-nourished.  Eyes: Conjunctivae are normal.  Cardiovascular: Normal rate, regular rhythm, normal heart sounds and normal pulses.  Pulmonary/Chest: Effort normal and breath sounds normal. She has no wheezes. She has no rhonchi. She has no rales.  Neurological: She is alert.  Skin: Skin is warm and dry.  Psychiatric: She has a normal mood and affect. Her speech is normal and behavior is normal. Thought content normal.  Vitals reviewed.      Assessment & Plan:   Problem List Items Addressed This Visit      Cardiovascular and Mediastinum   Hypertension - Primary    At goal .  We will do trial stop propranolol as I suspect amlodipine is perhaps lowering blood pressure more than propanolol.  She also was just started on nortriptyline.  Reasonable to trial eliminate one agent.  Will follow      Relevant Medications   amLODipine (NORVASC) 5 MG tablet     Digestive   GERD (gastroesophageal reflux disease)    Pending gi consult. Will follow      Relevant Medications   ranitidine (ZANTAC) 150 MG tablet     Other   Nonintractable headache    very Pleased has improved.  Patient is now following with neurology.  Will follow      Relevant Medications   baclofen (LIORESAL) 10 MG tablet   amLODipine (NORVASC) 5 MG tablet    Other Visit Diagnoses    Muscle spasm       Relevant Medications   baclofen (LIORESAL) 10 MG tablet       I have discontinued Mazie E. Markwardt's aspirin-acetaminophen-caffeine and methylPREDNISolone. I am also having her maintain her cetirizine, diphenhydrAMINE, latanoprost, timolol, ibuprofen, simethicone, Melatonin, promethazine, SUMAtriptan, PARoxetine, testosterone cypionate, propranolol, nortriptyline, amoxicillin-clavulanate, benzonatate, ranitidine, baclofen, and amLODipine. We will continue to administer testosterone cypionate, testosterone cypionate, testosterone cypionate, and testosterone cypionate.   Meds ordered this encounter    Medications  . ranitidine (ZANTAC) 150 MG tablet    Sig: Take 1 tablet (150 mg total) by mouth 2 (two) times daily for 7 days.    Dispense:  120 tablet    Refill:  2  . baclofen (LIORESAL) 10 MG tablet    Sig: TAKE 1/2 TABLET BY MOUTH 3 TIMES A DAY.    Dispense:  45 tablet    Refill:  0  . amLODipine (NORVASC) 5 MG tablet    Sig: Take 1 tablet (5 mg total) by mouth daily.    Dispense:  90 tablet    Refill:  1    Return precautions given.   Risks, benefits, and alternatives of the medications and treatment plan prescribed today were discussed, and patient expressed understanding.   Education regarding symptom management and diagnosis given to patient on AVS.  Continue to follow with Burnard Hawthorne, FNP for routine health maintenance.   [name] and I agreed with plan.   Mable Paris, FNP

## 2018-05-17 NOTE — Patient Instructions (Addendum)
Make sure you call back GI.   Lets do trial stop of propranolol - let me know if headaches or blood pressure increase.   Such a pleasure seeing you!   Managing Your Hypertension Hypertension is commonly called high blood pressure. This is when the force of your blood pressing against the walls of your arteries is too strong. Arteries are blood vessels that carry blood from your heart throughout your body. Hypertension forces the heart to work harder to pump blood, and may cause the arteries to become narrow or stiff. Having untreated or uncontrolled hypertension can cause heart attack, stroke, kidney disease, and other problems. What are blood pressure readings? A blood pressure reading consists of a higher number over a lower number. Ideally, your blood pressure should be below 120/80. The first ("top") number is called the systolic pressure. It is a measure of the pressure in your arteries as your heart beats. The second ("bottom") number is called the diastolic pressure. It is a measure of the pressure in your arteries as the heart relaxes. What does my blood pressure reading mean? Blood pressure is classified into four stages. Based on your blood pressure reading, your health care provider may use the following stages to determine what type of treatment you need, if any. Systolic pressure and diastolic pressure are measured in a unit called mm Hg. Normal  Systolic pressure: below 542.  Diastolic pressure: below 80. Elevated  Systolic pressure: 706-237.  Diastolic pressure: below 80. Hypertension stage 1  Systolic pressure: 628-315.  Diastolic pressure: 17-61. Hypertension stage 2  Systolic pressure: 607 or above.  Diastolic pressure: 90 or above. What health risks are associated with hypertension? Managing your hypertension is an important responsibility. Uncontrolled hypertension can lead to:  A heart attack.  A stroke.  A weakened blood vessel (aneurysm).  Heart  failure.  Kidney damage.  Eye damage.  Metabolic syndrome.  Memory and concentration problems.  What changes can I make to manage my hypertension? Hypertension can be managed by making lifestyle changes and possibly by taking medicines. Your health care provider will help you make a plan to bring your blood pressure within a normal range. Eating and drinking  Eat a diet that is high in fiber and potassium, and low in salt (sodium), added sugar, and fat. An example eating plan is called the DASH (Dietary Approaches to Stop Hypertension) diet. To eat this way: ? Eat plenty of fresh fruits and vegetables. Try to fill half of your plate at each meal with fruits and vegetables. ? Eat whole grains, such as whole wheat pasta, brown rice, or whole grain bread. Fill about one quarter of your plate with whole grains. ? Eat low-fat diary products. ? Avoid fatty cuts of meat, processed or cured meats, and poultry with skin. Fill about one quarter of your plate with lean proteins such as fish, chicken without skin, beans, eggs, and tofu. ? Avoid premade and processed foods. These tend to be higher in sodium, added sugar, and fat.  Reduce your daily sodium intake. Most people with hypertension should eat less than 1,500 mg of sodium a day.  Limit alcohol intake to no more than 1 drink a day for nonpregnant women and 2 drinks a day for men. One drink equals 12 oz of beer, 5 oz of wine, or 1 oz of hard liquor. Lifestyle  Work with your health care provider to maintain a healthy body weight, or to lose weight. Ask what an ideal weight is for  you.  Get at least 30 minutes of exercise that causes your heart to beat faster (aerobic exercise) most days of the week. Activities may include walking, swimming, or biking.  Include exercise to strengthen your muscles (resistance exercise), such as weight lifting, as part of your weekly exercise routine. Try to do these types of exercises for 30 minutes at least  3 days a week.  Do not use any products that contain nicotine or tobacco, such as cigarettes and e-cigarettes. If you need help quitting, ask your health care provider.  Control any long-term (chronic) conditions you have, such as high cholesterol or diabetes. Monitoring  Monitor your blood pressure at home as told by your health care provider. Your personal target blood pressure may vary depending on your medical conditions, your age, and other factors.  Have your blood pressure checked regularly, as often as told by your health care provider. Working with your health care provider  Review all the medicines you take with your health care provider because there may be side effects or interactions.  Talk with your health care provider about your diet, exercise habits, and other lifestyle factors that may be contributing to hypertension.  Visit your health care provider regularly. Your health care provider can help you create and adjust your plan for managing hypertension. Will I need medicine to control my blood pressure? Your health care provider may prescribe medicine if lifestyle changes are not enough to get your blood pressure under control, and if:  Your systolic blood pressure is 130 or higher.  Your diastolic blood pressure is 80 or higher.  Take medicines only as told by your health care provider. Follow the directions carefully. Blood pressure medicines must be taken as prescribed. The medicine does not work as well when you skip doses. Skipping doses also puts you at risk for problems. Contact a health care provider if:  You think you are having a reaction to medicines you have taken.  You have repeated (recurrent) headaches.  You feel dizzy.  You have swelling in your ankles.  You have trouble with your vision. Get help right away if:  You develop a severe headache or confusion.  You have unusual weakness or numbness, or you feel faint.  You have severe pain in  your chest or abdomen.  You vomit repeatedly.  You have trouble breathing. Summary  Hypertension is when the force of blood pumping through your arteries is too strong. If this condition is not controlled, it may put you at risk for serious complications.  Your personal target blood pressure may vary depending on your medical conditions, your age, and other factors. For most people, a normal blood pressure is less than 120/80.  Hypertension is managed by lifestyle changes, medicines, or both. Lifestyle changes include weight loss, eating a healthy, low-sodium diet, exercising more, and limiting alcohol. This information is not intended to replace advice given to you by your health care provider. Make sure you discuss any questions you have with your health care provider. Document Released: 08/09/2012 Document Revised: 10/13/2016 Document Reviewed: 10/13/2016 Elsevier Interactive Patient Education  [employer].

## 2018-05-17 NOTE — Assessment & Plan Note (Signed)
Pending gi consult. Will follow

## 2018-05-17 NOTE — Assessment & Plan Note (Signed)
At goal .  We will do trial stop propranolol as I suspect amlodipine is perhaps lowering blood pressure more than propanolol.  She also was just started on nortriptyline.  Reasonable to trial eliminate one agent.  Will follow

## 2018-05-17 NOTE — Assessment & Plan Note (Signed)
very Pleased has improved.  Patient is now following with neurology.  Will follow

## 2018-05-18 ENCOUNTER — Ambulatory Visit: Payer: Self-pay | Admitting: Family

## 2018-05-18 ENCOUNTER — Encounter: Payer: Self-pay | Admitting: Family

## 2018-05-18 NOTE — Telephone Encounter (Signed)
Pt called to report high bp of 143/110 and 153/111. Pt was in office yesterday and her PCP wanted to trial her off the propanolol. Pt did not take her dose last night or this am. She stated she developed headache last night. With BP elevation and headache- advised her to go to [facility] or ED.  Called to PCP office and spoke to Patina regarding pt. Asked to send message high priority and to advise her to go to [facility].  Pt stated that she will not be going to [facility] or ED. She stated that she is going to restart her propanolol and send her PCP a note via MyChart.  Care advice given per protocol. Pt advised to go to [facility]/ED if she experiences weakness or numbness of the dace arm or leg on one side of the body, difficulty walking, talking or severe headache occurs, chest pain, difficulty breathing occurs.   Reason for Disposition . Systolic BP  >= 503 OR Diastolic >= 546  Answer Assessment - Initial Assessment Questions 1. BLOOD PRESSURE: "What is the blood pressure?" "Did you take at least two measurements 5 minutes apart?"     143/110      Rechecked BP during call: 153/111 2. ONSET: "When did you take your blood pressure?"     2 pm. 3. HOW: "How did you obtain the blood pressure?" (e.g., visiting nurse, automatic home BP monitor) Automatic home BP monitor 4. HISTORY: "Do you have a history of high blood pressure?"    yes 5. MEDICATIONS: "Are you taking any medications for blood pressure?" "Have you missed any doses recently?"     Amlodipine- no   Stopped propanolol per order of PCP to see if pt needs it.  6. OTHER SYMPTOMS: "Do you have any symptoms?" (e.g., headache, chest pain, blurred vision, difficulty breathing, weakness) headache 7. PREGNANCY: "Is there any chance you are pregnant?" "When was your last menstrual period?"    no  Protocols used: HIGH BLOOD PRESSURE-A-AH

## 2018-05-18 NOTE — Telephone Encounter (Signed)
Spoke with patient she states  she didn't feel right when she got up this am from not taking metoprolol dose. She felt bad in general , went home at lunch and took propanolol dose.   She is going back to work now after taking metoprolol dose .  She denies any symptoms except headache.  After she gets off work she will go by drug store and take blood pressure and send you message via my chart and she wants to resume metoprolol dosing .   If she develops and symptoms such as weakness, numbness in arm, face, body, severe headache, SOB, chest pain she will seek immediate attention.

## 2018-05-18 NOTE — Telephone Encounter (Signed)
Please advise 

## 2018-05-19 ENCOUNTER — Other Ambulatory Visit: Payer: Self-pay

## 2018-05-19 DIAGNOSIS — R519 Headache, unspecified: Secondary | ICD-10-CM

## 2018-05-19 DIAGNOSIS — I10 Essential (primary) hypertension: Secondary | ICD-10-CM

## 2018-05-19 DIAGNOSIS — R51 Headache: Principal | ICD-10-CM

## 2018-05-19 MED ORDER — PROPRANOLOL HCL 40 MG PO TABS: 40.0000 mg | ORAL_TABLET | Freq: Two times a day (BID) | ORAL | 1 refills | Status: DC

## 2018-05-19 NOTE — Telephone Encounter (Signed)
Patient states that she has not checked BP this morning but she has taken her propanalol this morning. She states that she took it last night as well and a few hours later felt much better so she is in agreement of staying on that medication. Refill sent.

## 2018-05-19 NOTE — Telephone Encounter (Signed)
noted 

## 2018-05-19 NOTE — Telephone Encounter (Signed)
Call pt  How is BP today? How does she feel? We had done a trial stop of stopping metoprolol however it seems she needs  Please advise her to stay on medication and add back to her med list

## 2018-06-07 ENCOUNTER — Encounter: Payer: Self-pay | Admitting: Obstetrics and Gynecology

## 2018-06-07 ENCOUNTER — Ambulatory Visit: Payer: Managed Care, Other (non HMO) | Admitting: Obstetrics and Gynecology

## 2018-06-07 VITALS — BP 115/78 | HR 60 | Ht 62.0 in | Wt 218.9 lb

## 2018-06-07 DIAGNOSIS — R6882 Decreased libido: Secondary | ICD-10-CM | POA: Diagnosis not present

## 2018-06-07 DIAGNOSIS — Z780 Asymptomatic menopausal state: Secondary | ICD-10-CM

## 2018-06-07 NOTE — Progress Notes (Signed)
    GYNECOLOGY PROGRESS NOTE  Subjective:    Patient ID: Yvonne [name], female    DOB: [birth year], 52 y.o.   MRN: [id number]  HPI  Patient is a 51 y.o. G2P2 female who presents for discussion of alternative options for decreased libido.  She is currently receiving testosterone injections, however notes that she can't tolerate the side effects any longer (specifically the hair growth). Desires to take a break from the testosterone, but also desires to discuss any other options.   The following portions of the patient's history were reviewed and updated as appropriate: allergies, current medications, past family history, past medical history, past social history, past surgical history and problem list.  Review of Systems Pertinent items noted in HPI and remainder of comprehensive ROS otherwise negative.   Objective:   Blood pressure 115/78, pulse 60, height 5\' 2"  (1.575 m), weight 218 lb 14.4 oz (99.3 kg). General appearance: alert and no distress Remainder of exam deferred.    Assessment:   Decreased libido Postmenopausal female  Plan:   - Discussed alternative options for patient, including herbal remedies (Zestra, Avlimil); using testosterone in a different form such as spray or gel (which may decrease certain side effects), and even a trial of Viagra as a las resort.  Continued to encourage lifestyle interventions. with changing scenery, trying new ventures, erotica, etc. Patient also considering engaging in weight loss, and I encouraged her that it could actually boost her overall health in addition to metabolism and libido.  Advised on OTC weight loss supplements in addition to modification of diet and exercise.  - Patient to f/u once decision is made on future therapy option.   A total of 15 minutes were spent face-to-face with the patient during this encounter and over half of that time dealt with counseling and coordination of care.   Rubie Maid, MD [facility]

## 2018-06-07 NOTE — Progress Notes (Signed)
Pt stated that she wants to stop the testosterone injections and try another option.

## 2018-06-14 ENCOUNTER — Ambulatory Visit: Payer: Self-pay | Admitting: Family Medicine

## 2018-06-14 VITALS — BP 142/82 | HR 70 | Temp 98.1°F | Resp 14

## 2018-06-14 DIAGNOSIS — R05 Cough: Secondary | ICD-10-CM

## 2018-06-14 DIAGNOSIS — R059 Cough, unspecified: Secondary | ICD-10-CM

## 2018-06-14 DIAGNOSIS — R062 Wheezing: Secondary | ICD-10-CM

## 2018-06-14 MED ORDER — ALBUTEROL SULFATE HFA 108 (90 BASE) MCG/ACT IN AERS: 2.0000 | INHALATION_SPRAY | Freq: Four times a day (QID) | RESPIRATORY_TRACT | 0 refills | Status: DC | PRN

## 2018-06-14 MED ORDER — BENZONATATE 200 MG PO CAPS: 200.0000 mg | ORAL_CAPSULE | Freq: Every evening | ORAL | 0 refills | Status: DC | PRN

## 2018-06-14 NOTE — Progress Notes (Signed)
Subjective: cough     Yvonne [name] is a 52 y.o. female who presents for evaluation of nonproductive cough, nasal congestion, sore throat, and hoarse voice for 4 days.  Patient also endorses intermittent mild wheezing and "feeling like my airways are a little tight".  Denies shortness of breath.  Patient was treated on 05/10/2018 for sinusitis with Augmentin.  Patient reports symptoms resolved completely and she was asymptomatic prior to the onset of her symptoms 4 days ago.  Symptoms have remained stable.  Patient presents today because the cough is keeping her up at night.  Cough is present both during the day and night.  Treatment to date: Denies any.  Denies rash, nausea, vomiting, diarrhea, SOB, chest or back pain, ear pain, difficulty swallowing, confusion, anosmia/hyposmia, dental pain, facial pressure/unilateral facial pain, pain exacerbated by bending over, headache, body aches, fatigue, fever, chills, severe symptoms, or initial improvement and then worsening of symptoms. History of smoking, asthma, COPD: Negative.  History of smoking in the remote past. History of recurrent sinus and/or lung infections: Negative.  Review of Systems Pertinent items noted in HPI and remainder of comprehensive ROS otherwise negative.     Objective:   Physical Exam General: Awake, alert, and oriented. No acute distress. Well developed, hydrated and nourished. Appears stated age. Nontoxic appearance.  HEENT:  PND noted.  Mild erythema to posterior oropharynx.  No edema or exudates of pharynx or tonsils. No erythema or bulging of TM. Erythema/edema to nasal mucosa. Sinuses nontender. Supple neck without adenopathy. Cardiac: Heart rate and rhythm are normal. No murmurs, gallops, or rubs are auscultated. S1 and S2 are heard and are of normal intensity.  Respiratory: No signs of respiratory distress. Lungs clear. No tachypnea. Able to speak in full sentences without dyspnea. Nonlabored respirations.  Skin: Skin  is warm, dry and intact. Appropriate color for ethnicity. No cyanosis noted.   Assessment:    bronchitis and viral upper respiratory illness   Plan:    Discussed diagnosis and treatment of URI and bronchitis. Discussed the importance of avoiding unnecessary antibiotic therapy. Suggested symptomatic OTC remedies. Nasal saline spray for congestion.   Patient has taken albuterol in the past when she has had wheezing/tightness such as this with URIs and reports tolerating it well with significant symptomatic improvement.  Prescribed albuterol PRN and discussed side/adverse effects.  Patient has taken this with current medication regimen and tolerated it well.  Advised patient monitor heart rate and blood pressure.  Discussed normal values. Prescribed Tessalon Perles to use as needed at night for cough.  Discussed side/adverse effects.  Patient has taken this in the past and tolerated it well. Patient's blood pressure is 142/82 today.  Discussed normal values.  Advised patient to monitor this regularly and report abnormal values to her primary care provider. Discussed red flag symptoms and circumstances with which to seek medical care.

## 2018-07-11 ENCOUNTER — Other Ambulatory Visit: Payer: Self-pay | Admitting: Family

## 2018-07-11 DIAGNOSIS — M62838 Other muscle spasm: Secondary | ICD-10-CM

## 2018-07-12 ENCOUNTER — Encounter: Payer: Self-pay | Admitting: Family

## 2018-07-12 NOTE — Telephone Encounter (Signed)
lov 05/17/18 Nov scheduled  Last filled 06/14/18

## 2018-08-01 ENCOUNTER — Other Ambulatory Visit: Payer: Self-pay | Admitting: Family

## 2018-08-01 DIAGNOSIS — I10 Essential (primary) hypertension: Secondary | ICD-10-CM

## 2018-08-01 DIAGNOSIS — R519 Headache, unspecified: Secondary | ICD-10-CM

## 2018-08-01 DIAGNOSIS — R51 Headache: Secondary | ICD-10-CM

## 2018-08-07 ENCOUNTER — Ambulatory Visit: Payer: Self-pay | Admitting: Family Medicine

## 2018-08-07 VITALS — BP 132/72 | HR 72 | Resp 16

## 2018-08-07 DIAGNOSIS — M25562 Pain in left knee: Principal | ICD-10-CM

## 2018-08-07 DIAGNOSIS — M25561 Pain in right knee: Principal | ICD-10-CM

## 2018-08-07 DIAGNOSIS — M25531 Pain in right wrist: Secondary | ICD-10-CM

## 2018-08-07 DIAGNOSIS — G8929 Other chronic pain: Secondary | ICD-10-CM

## 2018-08-07 MED ORDER — DICLOFENAC SODIUM 1 % TD GEL: TRANSDERMAL | 1 refills | Status: DC

## 2018-08-07 NOTE — Progress Notes (Signed)
Subjective: Right wrist and bilateral knee pain    Yvonne [name] is a 52 y.o. female who presents with knee pain involving both knees. Onset was Gradual starting a few years ago but reports worsening in the last 4 to 5 weeks.. Inciting event: none known. Current symptoms include: giving out, locking, pain located anterior knee, popping sensation, stiffness and swelling. Pain is aggravated by any weight bearing, going up and down stairs, kneeling, rising after sitting, squatting and walking.  The only injury the patient can recall was she tripped over a chair on 10/04/2018 and had an increase in right knee pain and swelling.  X-ray at that time demonstrated no evidence of fracture, small knee joint effusion, mild medial and patellofemoral compartment osteoarthritis.  Patient reports that shortly after this injury her pain decreased to its usual chronic level.  Patient reports obtaining an x-ray of her left knee in the last few years which also demonstrated osteoarthritis, although cannot find this in the system.  Denies any other work-up.  Otherwise patient has had no prior knee problems.  Treatment to date: Ibuprofen taken around-the-clock with and without relief.  Denies any other injuries, trauma, or surgery.  Denies numbness, tingling, or spasticity.  Yvonne [name] also presents for evaluation of right wrist pain. Onset was gradual, starting about 4 weeks ago. The pain is moderate, worsens with movement, and is relieved by rest. There is associated numbness in the first through fourth fingers on occasion.  Denies weakness or abnormal range of motion.  There is no history of injury, surgery, trauma, or strain.  Reports history of overuse to right wrist, recently been using her right wrist a lot at work at [employer].  Evaluation to date: none. Treatment to date: OTC analgesics.  Denies mechanical symptoms.  Patient localizes pain to first dorsal compartment, reports mild swelling to this area as well.    Review of Systems Pertinent items noted in HPI and remainder of comprehensive ROS otherwise negative.   Objective:    BP 132/72 (BP Location: Left Arm, Patient Position: Sitting, Cuff Size: Normal)   Pulse 72   Resp 16   LMP  (LMP Unknown)   SpO2 98%  Right knee: Positive exam findings: medial joint line tenderness and lateral joint line tenderness.  Mild anterior knee swelling.  Patient unable to tolerate range of motion testing due to pain.  Patient unable to squat due to pain.  Able to raise up on her toes bilaterally without any difficulty.  Antalgic gait.  Negative exam findings: no erythema, warmth to the touch, deformity, ecchymosis, ACL stable, PCL stable, MCL stable, LCL stable, no patellar laxity, McMurray's negative and no crepitus. Normal sensation.  Difficulty assessing strength due to pain.  Left knee:   Positive exam findings: medial joint line tenderness and lateral joint line tenderness.  Mild anterior knee swelling.  Patient unable to tolerate range of motion testing due to pain.  Patient unable to squat due to pain. Negative exam findings: no erythema, warmth to touch, ecchymosis, deformity, ACL stable, PCL stable, MCL stable, LCL stable, no patellar laxity, McMurray's negative and no crepitus. Normal sensation.  Difficulty assessing strength due to pain.   Right wrist:  Positive Finkelstein test.  Negative Tinel and Phalen test.  Mild edema and moderate tenderness at first dorsal compartment. Radial pulse and capillary refill normal.  Strength, range of motion, motor function, and sensation normal. Remainder of ipsilateral wrist, hand and finger exam is normal.  Denies pain to the  anatomic snuffbox.  No instability noted.  No deformity noted.  Left wrist:  normal exam, no swelling, tenderness, instability; ligaments intact, full ROM both hands, wrists, and finger joints  General appearance: NAD noted.  Patient alert and oriented x3.  Patient able to walk and get on and off exam  table without any assistance or difficulty.    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 Right de Quervain's tenosynovitis   Plan:   Patient refused any imaging of her knees or wrist today, citing concern for cost.  Discussed benefits of imaging and risks of not performing these.   Advised patient to stop taking ibuprofen and/or oral NSAIDs daily and prescribed Voltaren gel.  Discussed side/adverse effects and instructions for use.  Provided patient with a splint for her hand/wrist.  Recommended RICE modalities.  Advised her to avoid repetitive activities involving her right thumb, hand, and wrist.  Advised patient to follow-up with her primary care provider and/or orthopedics if this does not improve in 4 to 6 weeks or sooner if needed. Orthopedic referral placed for bilateral knee pain.  Also provided patient with information for [facility] urgent care in case she needs to be seen prior to her orthopedic appointment set up through the referral.  Patient is hesitant to see orthopedics due to cost but reinforced the importance of doing this and my concern for further injury if knee were to give out and she were to fall.  Discussed fall preventative measures.  Natural history and expected course discussed. Questions answered. Red flag symptoms and indications to seek medical care discussed.  New Prescriptions   DICLOFENAC SODIUM (VOLTAREN) 1 % GEL    Apply 4 gm topically to each knee TID PRN knee pain and 2 gm to right wrist topically TID PRN wrist pain.

## 2018-09-12 ENCOUNTER — Other Ambulatory Visit: Payer: Self-pay | Admitting: Family

## 2018-09-12 DIAGNOSIS — M62838 Other muscle spasm: Secondary | ICD-10-CM

## 2018-09-13 ENCOUNTER — Encounter: Payer: Self-pay | Admitting: Family

## 2018-09-13 NOTE — Telephone Encounter (Signed)
Last refill 08/16/18 No office visit scheduled  Last office visit 05/17/18

## 2018-09-15 ENCOUNTER — Other Ambulatory Visit: Payer: Self-pay | Admitting: Family

## 2018-09-15 DIAGNOSIS — M62838 Other muscle spasm: Secondary | ICD-10-CM

## 2018-09-15 MED ORDER — BACLOFEN 10 MG PO TABS: 10.0000 mg | ORAL_TABLET | Freq: Every day | ORAL | 1 refills | Status: DC | PRN

## 2018-10-05 ENCOUNTER — Encounter: Payer: Self-pay | Admitting: Emergency Medicine

## 2018-10-05 ENCOUNTER — Ambulatory Visit: Payer: Self-pay | Admitting: Emergency Medicine

## 2018-10-05 VITALS — BP 138/82 | HR 70 | Temp 98.1°F | Resp 16 | Ht 62.5 in | Wt 213.0 lb

## 2018-10-05 DIAGNOSIS — R059 Cough, unspecified: Secondary | ICD-10-CM

## 2018-10-05 DIAGNOSIS — J0101 Acute recurrent maxillary sinusitis: Secondary | ICD-10-CM

## 2018-10-05 DIAGNOSIS — R05 Cough: Secondary | ICD-10-CM

## 2018-10-05 LAB — POCT INFLUENZA A/B
Influenza A, POC: NEGATIVE
Influenza B, POC: NEGATIVE

## 2018-10-05 MED ORDER — PREDNISONE 10 MG (21) PO TBPK: ORAL_TABLET | ORAL | 0 refills | Status: DC

## 2018-10-05 MED ORDER — CEFDINIR 300 MG PO CAPS: 300.0000 mg | ORAL_CAPSULE | Freq: Two times a day (BID) | ORAL | 0 refills | Status: DC

## 2018-10-05 NOTE — Progress Notes (Signed)
Subjective:  Yvonne [name] is a 52 y.o. female who complains of onset of cold and sinus symptoms for 5 days.  Progressively worsening.  have been using over-the-counter treatment which helps minimally  No chills/sweats +  Fever +  Nasal congestion +  Discolored Post-nasal drainage Complains of severe sinus pain/pressure No sore throat + Minimal nonproductive cough No wheezing currently, but had wheezing last night. No chest congestion No hemoptysis No shortness of breath No pleuritic pain  No itchy/red eyes No earache, but has bilateral ear fullness sensation.  No drainage  No nausea No vomiting No abdominal pain No diarrhea  No skin rashes +  Fatigue Has minimal myalgias No headache , except for feeling of headache/pressure over maxillary sinuses.  Denies migraine headache symptoms. Last URI treated with antibiotic was about 4 months ago, treated with Augmentin, which she states worked "okay but not great".  Allergies: Sulfa Flonase-"increases eye pressure" She states she is taken oral prednisone burst in the past without side effects or increase in eye pressure. Tussionex-"itching"  She quit smoking 2001  PHYSICAL EXAM:  Vital signs noted. General:  Alert, fatigued, no distress. Sounds congested. Head: Normocephalic. Tender over maxillary sinuses. Ears: TMs with mild air-fluid levels.  Otherwise, TMs normal without redness. Nose: Very boggy turbinates, seromucoid drainage. Pharynx: Mild postnasal drainage. No significant erythema. No tonsillar enlargement or exudate. Neck:  supple, no adenopathy. Lungs: Equal expansion.  Rare scattered rhonchi.  Mild late expiratory wheezes only on forced expiration.  Otherwise no wheezes.  No rales.  Breath sounds equal bilaterally Heart: RRR Abdomen: Nondistended Skin: No rash  Rapid flu test today negative.  ASSESSMENT : Acute maxillary sinusitis.  Likely has infection and inflammatory component, but we need to avoid Flonase, see  above.  She has tolerated short course of oral prednisone, without side effects, so we will treat with 6-day Dosepak in addition to antibiotic.  PLANS: Treatment options discussed, as well as risks, benefits, alternatives. Patient voiced understanding and agreement with the following plans:  New Prescriptions   CEFDINIR (OMNICEF) 300 MG CAPSULE    Take 1 capsule (300 mg total) by mouth 2 (two) times daily. X 10 days   PREDNISONE (STERAPRED UNI-PAK 21 TAB) 10 MG (21) TBPK TABLET    Take as directed for 6 days.--Take 6 on day 1, 5 on day 2, 4 on day 3, then 3 tablets on day 4, then 2 tablets on day 5, then 1 on day 6.    We discussed various nonpharmacologic methods.   Follow-up with your PCP in 5-7 days if not improving, or sooner if symptoms become worse. Precautions discussed. Red flags discussed. Questions invited and answered. Patient voiced understanding and agreement.

## 2018-10-10 ENCOUNTER — Telehealth: Payer: Self-pay

## 2018-10-10 ENCOUNTER — Ambulatory Visit: Payer: Self-pay

## 2018-10-10 NOTE — Telephone Encounter (Signed)
Copied from [place] [id number]. Topic: General - Other >> Oct 10, 2018 11:27 AM Burchel, Abbi R wrote: Pt is requesting to be billed for her co-pay.  Please call pt to advise.   [phone]    **I spoke with Caryl Pina & she advised me to call patient to ask her just request to be billed at her appointment tomorrow 11/13. I called & advised patient.**

## 2018-10-10 NOTE — Telephone Encounter (Unsigned)
Copied from [place] [id number] [phone]. Topic: General - Other >> Oct 10, 2018 11:27 AM Burchel, Abbi R wrote: Pt is requesting to be billed for her co-pay.  Please call pt to advise.   [phone]

## 2018-10-11 ENCOUNTER — Encounter: Payer: Self-pay | Admitting: Family

## 2018-10-11 ENCOUNTER — Ambulatory Visit (INDEPENDENT_AMBULATORY_CARE_PROVIDER_SITE_OTHER): Payer: Managed Care, Other (non HMO) | Admitting: Family

## 2018-10-11 ENCOUNTER — Telehealth: Payer: Self-pay

## 2018-10-11 ENCOUNTER — Ambulatory Visit (INDEPENDENT_AMBULATORY_CARE_PROVIDER_SITE_OTHER): Payer: Managed Care, Other (non HMO)

## 2018-10-11 VITALS — BP 130/84 | HR 90 | Temp 98.2°F | Resp 18 | Ht 62.5 in | Wt 226.1 lb

## 2018-10-11 DIAGNOSIS — R05 Cough: Secondary | ICD-10-CM

## 2018-10-11 DIAGNOSIS — R062 Wheezing: Secondary | ICD-10-CM

## 2018-10-11 DIAGNOSIS — J0100 Acute maxillary sinusitis, unspecified: Secondary | ICD-10-CM

## 2018-10-11 DIAGNOSIS — R059 Cough, unspecified: Secondary | ICD-10-CM

## 2018-10-11 MED ORDER — DOXYCYCLINE HYCLATE 100 MG PO TABS: 100.0000 mg | ORAL_TABLET | Freq: Two times a day (BID) | ORAL | 0 refills | Status: DC

## 2018-10-11 MED ORDER — ALBUTEROL SULFATE HFA 108 (90 BASE) MCG/ACT IN AERS: 2.0000 | INHALATION_SPRAY | Freq: Four times a day (QID) | RESPIRATORY_TRACT | 0 refills | Status: DC | PRN

## 2018-10-11 MED ORDER — LEVOFLOXACIN 500 MG PO TABS: 500.0000 mg | ORAL_TABLET | Freq: Every day | ORAL | 0 refills | Status: DC

## 2018-10-11 MED ORDER — ALBUTEROL SULFATE (2.5 MG/3ML) 0.083% IN NEBU
2.5000 mg | INHALATION_SOLUTION | Freq: Once | RESPIRATORY_TRACT | Status: AC
  Administered 2018-10-11: 2.5 mg via RESPIRATORY_TRACT

## 2018-10-11 NOTE — Telephone Encounter (Signed)
Patient states she forgot to let us know that she is on Doxycyline 20mg  bid prescribed by [facility] .  Please advise.

## 2018-10-11 NOTE — Patient Instructions (Addendum)
Stop Ceftinir.  Start levaquin. It carries a black box warning for tendon rupture. If you experience any leg pain , please go to emergency room and STOP levaquin.  Ensure to take probiotics while on antibiotics and also for 2 weeks after completion. It is important to re-colonize the gut with good bacteria and also to prevent any diarrheal infections associated with antibiotic use.   Use albuterol every 6 hours for first 24 hours to get good medication into the lungs and loosen congestion; after, you may use as needed and eventually stop all together when cough resolves.  May use tessalon  PLAIN mucinex with water to break up congestion  Let me know if you are not better ; if you a fail second treatment course, we need to purse imaging and be referred to ENT for further evaluation

## 2018-10-11 NOTE — Progress Notes (Addendum)
Subjective:    Patient ID: Yvonne [name], female    DOB: 11/12/1966, 52 y.o.   MRN: [id number]  CC: [name] is a 52 y.o. female who presents today for an acute visit.    HPI: CC: thick yellow- green congestion x 8 days, wornseing  Endorses sore throat, thick congestion, facial pain, teeth pain, cough, wheezing Tactile fever and chills at night. Hasnt used inhaler as worried about side effects with new medications.    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  Started on cefdinir 7 days ago. Prednisone helped first couple of days  Started on tessalon , cefdinir, prednisone taper.  Will get a sinus infection 1-2 per year due to tree pollen. Saline spray has been helping.   Currently on doxcycline by her dermatologist.     Former smoker -18 years ago.  HISTORY:  Past Medical History:  Diagnosis Date  . Allergy   . GERD (gastroesophageal reflux disease)   . Gestational diabetes    patient denies  . Glaucoma   . H/O bronchitis   . Headache    migraine  . History of ovarian cyst   . Hypertension    pre-eclampsia with first child  . Increased pressure in the eye, bilateral    Past Surgical History:  Procedure Laterality Date  . CERVICAL POLYPECTOMY N/A 04/11/2017   Procedure: CERVICAL POLYPECTOMY;  Surgeon: Rubie Maid, MD;  Location: [facility];  Service: Gynecology;  Laterality: N/A;  . HYSTEROSCOPY W/D&C N/A 04/11/2017   Procedure: DILATATION AND CURETTAGE /HYSTEROSCOPY;  Surgeon: Rubie Maid, MD;  Location: [facility];  Service: Gynecology;  Laterality: N/A;  . IUD REMOVAL N/A 05/05/2017   Procedure: INTRAUTERINE DEVICE (IUD) REMOVAL;  Surgeon: Rubie Maid, MD;  Location: [facility];  Service: Gynecology;  Laterality: N/A;  . LAPAROSCOPIC SALPINGO OOPHERECTOMY Bilateral 05/05/2017   Procedure: LAPAROSCOPIC BILATERAL SALPINGO OOPHORECTOMY;  Surgeon: Rubie Maid, MD;  Location: [facility];  Service: Gynecology;  Laterality: Bilateral;  .  LAPAROSCOPY N/A 04/11/2017   Procedure: LAPAROSCOPY DIAGNOSTIC;  Surgeon: Rubie Maid, MD;  Location: [facility];  Service: Gynecology;  Laterality: N/A;  . PARTIAL HYSTERECTOMY     Ovaries and Tubes  . TUBAL LIGATION     Family History  Problem Relation Age of Onset  . Schizophrenia Mother   . Bipolar disorder Mother   . Alcohol abuse Father   . Hypertension Father   . Seizures Paternal Grandmother   . Diabetes Paternal Grandmother   . Hypertension Brother   . Hypertension Maternal Grandmother     Allergies: Flonase [fluticasone propionate]; Sulfa antibiotics; and Tussionex pennkinetic er [hydrocod polst-cpm polst er] Current Outpatient Medications on File Prior to Visit  Medication Sig Dispense Refill  . albuterol (PROVENTIL HFA;VENTOLIN HFA) 108 (90 Base) MCG/ACT inhaler Inhale 2 puffs into the lungs every 6 (six) hours as needed for wheezing or shortness of breath. 1 Inhaler 0  . amLODipine (NORVASC) 5 MG tablet Take 1 tablet (5 mg total) by mouth daily. 30 tablet 3  . baclofen (LIORESAL) 10 MG tablet Take 1 tablet (10 mg total) by mouth daily as needed for muscle spasms. 30 tablet 1  . cefdinir (OMNICEF) 300 MG capsule Take 1 capsule (300 mg total) by mouth 2 (two) times daily. X 10 days 20 capsule 0  . cetirizine (ZYRTEC) 10 MG tablet Take 10 mg by mouth daily.    . diclofenac sodium (VOLTAREN) 1 % GEL Apply 4 gm topically to each knee TID  PRN knee pain and 2 gm to right wrist topically TID PRN wrist pain. 1 Tube 1  . diphenhydrAMINE (BENADRYL) 25 MG tablet Take 25 mg by mouth at bedtime.    Marland Kitchen latanoprost (XALATAN) 0.005 % ophthalmic solution Place 1 drop into both eyes at bedtime.    . Melatonin 10 MG TABS Take 10 mg by mouth at bedtime.    . nortriptyline (PAMELOR) 10 MG capsule Start Nortriptyline (Pamelor) 10 mg nightly for one week, then increase to 20 mg nightly    . PARoxetine (PAXIL) 20 MG tablet Take 1 tablet (20 mg total) by mouth daily. 30 tablet 11  . predniSONE  (STERAPRED UNI-PAK 21 TAB) 10 MG (21) TBPK tablet Take as directed for 6 days.--Take 6 on day 1, 5 on day 2, 4 on day 3, then 3 tablets on day 4, then 2 tablets on day 5, then 1 on day 6. 21 tablet 0  . promethazine (PHENERGAN) 25 MG tablet Take 1 tablet (25 mg total) by mouth every 8 (eight) hours as needed for nausea or vomiting. 20 tablet 0  . propranolol (INDERAL) 40 MG tablet Take 1 tablet (40 mg total) by mouth 2 (two) times daily. 60 tablet 3  . ranitidine (ZANTAC) 150 MG tablet Take 150 mg by mouth 2 (two) times daily.    . SUMAtriptan (IMITREX) 100 MG tablet May repeat in 2 hours if headache persists or recurs. 10 tablet 6  . timolol (BETIMOL) 0.25 % ophthalmic solution Place 1 drop into both eyes daily.     Current Facility-Administered Medications on File Prior to Visit  Medication Dose Route Frequency Provider Last Rate Last Dose  . testosterone cypionate (DEPOTESTOSTERONE CYPIONATE) injection 200 mg  200 mg Intramuscular Q28 days Rubie Maid, MD   200 mg at 01/03/18 1135  . testosterone cypionate (DEPOTESTOSTERONE CYPIONATE) injection 200 mg  200 mg Intramuscular Q28 days Rubie Maid, MD      . testosterone cypionate (DEPOTESTOSTERONE CYPIONATE) injection 200 mg  200 mg Intramuscular Q28 days Rubie Maid, MD   200 mg at 03/27/18 1041  . testosterone cypionate (DEPOTESTOSTERONE CYPIONATE) injection 50 mg  50 mg Intramuscular Q28 days Rubie Maid, MD   50 mg at 05/05/18 1130    Social History   Tobacco Use  . Smoking status: Former Smoker    Packs/day: 1.00    Years: 6.00    Pack years: 6.00    Types: Cigarettes    Last attempt to quit: 05/29/2000    Years since quitting: 18.3  . Smokeless tobacco: Never Used  . Tobacco comment: 1/2-1 PPD  Substance Use Topics  . Alcohol use: No    Alcohol/week: 0.0 standard drinks  . Drug use: No    Review of Systems  Constitutional: Positive for chills and fever (tactile). Negative for unexpected weight change.  HENT: Positive  for congestion, sinus pain and sore throat. Negative for facial swelling and trouble swallowing.   Eyes: Negative for visual disturbance.  Respiratory: Positive for cough and wheezing. Negative for shortness of breath.   Cardiovascular: Negative for chest pain, palpitations and leg swelling.  Gastrointestinal: Negative for nausea and vomiting.  Musculoskeletal: Negative for arthralgias and myalgias.  Skin: Negative for rash.  Neurological: Negative for headaches.  Hematological: Negative for adenopathy.  Psychiatric/Behavioral: Negative for confusion.      Objective:    BP 130/84 (BP Location: Left Arm, Patient Position: Sitting, Cuff Size: Large)   Pulse 90   Temp 98.2 F (36.8 C) (Oral)  Resp 18   Ht 5' 2.5" (1.588 m)   Wt 226 lb 2 oz (102.6 kg)   LMP  (LMP Unknown)   SpO2 98%   BMI 40.70 kg/m    Physical Exam  Constitutional: She appears well-developed and well-nourished.  HENT:  Head: Normocephalic and atraumatic.  Right Ear: Hearing, tympanic membrane, external ear and ear canal normal. No drainage, swelling or tenderness. No foreign bodies. Tympanic membrane is not erythematous and not bulging. No middle ear effusion. No decreased hearing is noted.  Left Ear: Hearing, tympanic membrane, external ear and ear canal normal. No drainage, swelling or tenderness. No foreign bodies. Tympanic membrane is not erythematous and not bulging.  No middle ear effusion. No decreased hearing is noted.  Nose: No rhinorrhea. Right sinus exhibits maxillary sinus tenderness. Right sinus exhibits no frontal sinus tenderness. Left sinus exhibits maxillary sinus tenderness. Left sinus exhibits no frontal sinus tenderness.  Mouth/Throat: Uvula is midline, oropharynx is clear and moist and mucous membranes are normal. No oropharyngeal exudate, posterior oropharyngeal edema, posterior oropharyngeal erythema or tonsillar abscesses.  Eyes: Conjunctivae are normal.  Cardiovascular: Regular rhythm,  normal heart sounds and normal pulses.  Pulmonary/Chest: Effort normal. She has wheezes in the left lower field. She has no rhonchi. She has no rales.  Lymphadenopathy:       Head (right side): No submental, no submandibular, no tonsillar, no preauricular, no posterior auricular and no occipital adenopathy present.       Head (left side): No submental, no submandibular, no tonsillar, no preauricular, no posterior auricular and no occipital adenopathy present.    She has no cervical adenopathy.  Neurological: She is alert.  Skin: Skin is warm and dry.  Psychiatric: She has a normal mood and affect. Her speech is normal and behavior is normal. Thought content normal.  Vitals reviewed.  Patient felt significantly better after albuterol treatment. Lung sounds clear and increased     Assessment & Plan:   1. Acute non-recurrent maxillary sinusitis Patient well-appearing.  No acute respiratory distress.  She does have wheezing on exam however this improved with nebulizer treatment.  Advised the patient to start using albuterol inhaler at home.  She is on doxycyline per dermatology. Since no better on cefdinir, will change to levaquin. She was advised of black box warning.  Strongly emphasized the importance of probiotics.  Pending chest x-ray.  Patient will me know if not better. - DG Chest 2 View - albuterol (PROVENTIL) (2.5 MG/3ML) 0.083% nebulizer solution 2.5 mg - PR INHAL RX, AIRWAY OBST/DX SPUTUM INDUCT    I am having Sharica E. Nicole Kindred maintain her cetirizine, diphenhydrAMINE, latanoprost, timolol, Melatonin, promethazine, SUMAtriptan, PARoxetine, nortriptyline, ranitidine, albuterol, amLODipine, propranolol, diclofenac sodium, baclofen, predniSONE, and cefdinir. We will continue to administer testosterone cypionate, testosterone cypionate, testosterone cypionate, and testosterone cypionate.   No orders of the defined types were placed in this encounter.   Return precautions given.    Risks, benefits, and alternatives of the medications and treatment plan prescribed today were discussed, and patient expressed understanding.   Education regarding symptom management and diagnosis given to patient on AVS.  Continue to follow with Burnard Hawthorne, FNP for routine health maintenance.   [name] and I agreed with plan.   Mable Paris, FNP

## 2018-10-11 NOTE — Addendum Note (Signed)
Addended by: Burnard Hawthorne on: 10/11/2018 04:47 PM   Modules accepted: Orders

## 2018-10-14 ENCOUNTER — Encounter: Payer: Self-pay | Admitting: Family

## 2018-11-13 ENCOUNTER — Other Ambulatory Visit: Payer: Self-pay | Admitting: Family

## 2018-11-13 DIAGNOSIS — M62838 Other muscle spasm: Secondary | ICD-10-CM

## 2018-11-15 ENCOUNTER — Telehealth: Payer: Self-pay | Admitting: Family

## 2018-11-15 DIAGNOSIS — K219 Gastro-esophageal reflux disease without esophagitis: Secondary | ICD-10-CM

## 2018-11-15 MED ORDER — FAMOTIDINE 10 MG PO TABS: 10.0000 mg | ORAL_TABLET | Freq: Two times a day (BID) | ORAL | 1 refills | Status: DC

## 2018-11-15 NOTE — Telephone Encounter (Signed)
close

## 2018-11-17 ENCOUNTER — Encounter: Payer: Self-pay | Admitting: Family

## 2018-11-17 ENCOUNTER — Ambulatory Visit (INDEPENDENT_AMBULATORY_CARE_PROVIDER_SITE_OTHER): Payer: Managed Care, Other (non HMO) | Admitting: Family

## 2018-11-17 VITALS — BP 124/82 | HR 68 | Temp 98.5°F | Ht 62.5 in | Wt 230.0 lb

## 2018-11-17 DIAGNOSIS — M25562 Pain in left knee: Secondary | ICD-10-CM | POA: Diagnosis not present

## 2018-11-17 DIAGNOSIS — M25561 Pain in right knee: Secondary | ICD-10-CM | POA: Diagnosis not present

## 2018-11-17 DIAGNOSIS — G8929 Other chronic pain: Secondary | ICD-10-CM | POA: Diagnosis not present

## 2018-11-17 DIAGNOSIS — M25531 Pain in right wrist: Secondary | ICD-10-CM

## 2018-11-17 MED ORDER — MELOXICAM 7.5 MG PO TABS: 7.5000 mg | ORAL_TABLET | Freq: Every day | ORAL | 0 refills | Status: DC | PRN

## 2018-11-17 NOTE — Assessment & Plan Note (Addendum)
Suspect arthritic in nature based on presentation, exam today.  Advised that would be prudent to pursue autoimmune labs for rheumatoid however patient politely declines at this time due to cost.  Declines x-ray today.  She prefers to start conservative therapy, we can discuss this at future visits.  Meloxicam prescribed, advised icing regimen.  Will consider orthopedics if no improvement.

## 2018-11-17 NOTE — Assessment & Plan Note (Addendum)
Worsening, suspect lack of activity, weight gain contributory.  Prescribed Mobic to be used as needed.  Advise short trial this medication.  Declines x-ray of left knee.  If no improvement with ice, neoprene sleeve, home exercises as prescribed, we will consult orthopedics.  Advised physical therapy however patient politely declines today.

## 2018-11-17 NOTE — Progress Notes (Signed)
Subjective:    Patient ID: Yvonne [name], female    DOB: 1966/10/17, 52 y.o.   MRN: [id number]  CC: Yvonne [name] is a 52 y.o. female who presents today for follow up.   HPI: CC: bilateral knee for years, worsening. Colder tempetature will bother her more.  Stairs, getting up from sitting position are painful.Reports knee injury 1+ year ago, right knee had been swollen. Had Xray at that time,told arthritis.   Right wrist pain, wearing brace without relief.  Suspects wrist pain has occurred does have been using her arms more often to get up.  No numbness.  She is right-handed.  Has been taking ibuprofen and using voltaren gel with some relief.  Takes baclofen PRN for low back spasm  Works at [employer].   Approximately 70 pound weight gain in the past couple years.  No exercise due to pain.  Used to enjoy walking.  No history of cancer.  Reviewed x-ray from 2018, right knee shows small joint effusion, osteoarthritis.  HISTORY:  Past Medical History:  Diagnosis Date  . Allergy   . GERD (gastroesophageal reflux disease)   . Gestational diabetes    patient denies  . Glaucoma   . H/O bronchitis   . Headache    migraine  . History of ovarian cyst   . Hypertension    pre-eclampsia with first child  . Increased pressure in the eye, bilateral    Past Surgical History:  Procedure Laterality Date  . CERVICAL POLYPECTOMY N/A 04/11/2017   Procedure: CERVICAL POLYPECTOMY;  Surgeon: Rubie Maid, MD;  Location: [facility];  Service: Gynecology;  Laterality: N/A;  . HYSTEROSCOPY W/D&C N/A 04/11/2017   Procedure: DILATATION AND CURETTAGE /HYSTEROSCOPY;  Surgeon: Rubie Maid, MD;  Location: [facility];  Service: Gynecology;  Laterality: N/A;  . IUD REMOVAL N/A 05/05/2017   Procedure: INTRAUTERINE DEVICE (IUD) REMOVAL;  Surgeon: Rubie Maid, MD;  Location: [facility];  Service: Gynecology;  Laterality: N/A;  . LAPAROSCOPIC SALPINGO OOPHERECTOMY Bilateral 05/05/2017   Procedure: LAPAROSCOPIC  BILATERAL SALPINGO OOPHORECTOMY;  Surgeon: Rubie Maid, MD;  Location: [facility];  Service: Gynecology;  Laterality: Bilateral;  . LAPAROSCOPY N/A 04/11/2017   Procedure: LAPAROSCOPY DIAGNOSTIC;  Surgeon: Rubie Maid, MD;  Location: [facility];  Service: Gynecology;  Laterality: N/A;  . PARTIAL HYSTERECTOMY     Ovaries and Tubes  . TUBAL LIGATION     Family History  Problem Relation Age of Onset  . Schizophrenia Mother   . Bipolar disorder Mother   . Alcohol abuse Father   . Hypertension Father   . Seizures Paternal Grandmother   . Diabetes Paternal Grandmother   . Hypertension Brother   . Hypertension Maternal Grandmother     Allergies: Flonase [fluticasone propionate]; Sulfa antibiotics; and Tussionex pennkinetic er [hydrocod polst-cpm polst er] Current Outpatient Medications on File Prior to Visit  Medication Sig Dispense Refill  . amLODipine (NORVASC) 5 MG tablet Take 1 tablet (5 mg total) by mouth daily. 30 tablet 3  . baclofen (LIORESAL) 10 MG tablet Take 1 tablet (10 mg total) by mouth daily as needed for muscle spasms. 30 tablet 0  . cefdinir (OMNICEF) 300 MG capsule Take 1 capsule (300 mg total) by mouth 2 (two) times daily. X 10 days 20 capsule 0  . cetirizine (ZYRTEC) 10 MG tablet Take 10 mg by mouth daily.    . diclofenac sodium (VOLTAREN) 1 % GEL Apply 4 gm topically to each knee TID PRN knee pain and 2 gm to  right wrist topically TID PRN wrist pain. 1 Tube 1  . diphenhydrAMINE (BENADRYL) 25 MG tablet Take 25 mg by mouth at bedtime.    . famotidine (PEPCID AC) 10 MG tablet Take 1 tablet (10 mg total) by mouth 2 (two) times daily. 120 tablet 1  . latanoprost (XALATAN) 0.005 % ophthalmic solution Place 1 drop into both eyes at bedtime.    Marland Kitchen levofloxacin (LEVAQUIN) 500 MG tablet Take 1 tablet (500 mg total) by mouth daily. 7 tablet 0  . Melatonin 10 MG TABS Take 10 mg by mouth at bedtime.    . nortriptyline (PAMELOR) 10 MG capsule Start Nortriptyline (Pamelor) 10 mg nightly  for one week, then increase to 20 mg nightly    . PARoxetine (PAXIL) 20 MG tablet Take 1 tablet (20 mg total) by mouth daily. 30 tablet 11  . promethazine (PHENERGAN) 25 MG tablet Take 1 tablet (25 mg total) by mouth every 8 (eight) hours as needed for nausea or vomiting. 20 tablet 0  . propranolol (INDERAL) 40 MG tablet Take 1 tablet (40 mg total) by mouth 2 (two) times daily. 60 tablet 3  . SUMAtriptan (IMITREX) 100 MG tablet May repeat in 2 hours if headache persists or recurs. 10 tablet 6  . albuterol (PROVENTIL HFA;VENTOLIN HFA) 108 (90 Base) MCG/ACT inhaler Inhale 2 puffs into the lungs every 6 (six) hours as needed for wheezing or shortness of breath. (Patient not taking: Reported on 11/17/2018) 1 Inhaler 0  . timolol (BETIMOL) 0.25 % ophthalmic solution Place 1 drop into both eyes daily.     Current Facility-Administered Medications on File Prior to Visit  Medication Dose Route Frequency Provider Last Rate Last Dose  . testosterone cypionate (DEPOTESTOSTERONE CYPIONATE) injection 200 mg  200 mg Intramuscular Q28 days Rubie Maid, MD   200 mg at 01/03/18 1135  . testosterone cypionate (DEPOTESTOSTERONE CYPIONATE) injection 200 mg  200 mg Intramuscular Q28 days Rubie Maid, MD      . testosterone cypionate (DEPOTESTOSTERONE CYPIONATE) injection 200 mg  200 mg Intramuscular Q28 days Rubie Maid, MD   200 mg at 03/27/18 1041  . testosterone cypionate (DEPOTESTOSTERONE CYPIONATE) injection 50 mg  50 mg Intramuscular Q28 days Rubie Maid, MD   50 mg at 05/05/18 1130    Social History   Tobacco Use  . Smoking status: Former Smoker    Packs/day: 1.00    Years: 6.00    Pack years: 6.00    Types: Cigarettes    Last attempt to quit: 05/29/2000    Years since quitting: 18.4  . Smokeless tobacco: Never Used  . Tobacco comment: 1/2-1 PPD  Substance Use Topics  . Alcohol use: No    Alcohol/week: 0.0 standard drinks  . Drug use: No    Review of Systems  Constitutional: Negative for  chills and fever.  Respiratory: Negative for cough.   Cardiovascular: Negative for chest pain and palpitations.  Gastrointestinal: Negative for nausea and vomiting.  Musculoskeletal: Positive for arthralgias and back pain. Negative for joint swelling.  Neurological: Negative for numbness.      Objective:    BP 124/82 (BP Location: Left Arm, Patient Position: Sitting, Cuff Size: Large)   Pulse 68   Temp 98.5 F (36.9 C)   Ht 5' 2.5" (1.588 m)   Wt 230 lb (104.3 kg)   LMP  (LMP Unknown)   SpO2 98%   BMI 41.40 kg/m  BP Readings from Last 3 Encounters:  11/17/18 124/82  10/11/18 130/84  10/05/18 138/82  Wt Readings from Last 3 Encounters:  11/17/18 230 lb (104.3 kg)  10/11/18 226 lb 2 oz (102.6 kg)  10/05/18 213 lb (96.6 kg)    Physical Exam Vitals signs reviewed.  Constitutional:      Appearance: She is well-developed.  Eyes:     Conjunctiva/sclera: Conjunctivae normal.  Cardiovascular:     Rate and Rhythm: Normal rate and regular rhythm.     Pulses: Normal pulses.     Heart sounds: Normal heart sounds.  Pulmonary:     Effort: Pulmonary effort is normal.     Breath sounds: Normal breath sounds. No wheezing, rhonchi or rales.  Musculoskeletal:     Right wrist: She exhibits normal range of motion, no tenderness, no bony tenderness, no swelling and no effusion.     Right knee: She exhibits normal range of motion, no swelling, no effusion and no erythema. Tenderness found. Medial joint line tenderness noted.     Left knee: She exhibits normal range of motion, no swelling, no effusion and no erythema. Tenderness found. Medial joint line tenderness noted.     Comments: Grip strength normal. Palpable radial pulses and sensation intact. Negative tinels. No pain or limited ROM with  okay sign. Mild tenderness of CMC. No tenderness or bony step off along ulnar or radial border of wrist.   Mild pain with resisted wrist dorsiflexion.  Bilateral knees are symmetric. No  effusion appreciated. No increase in warmth or erythema. Significant crepitus felt with flexion of bilateral knees.  Right knee:  Able to extend to -5 to 10 degrees and flex to 110 degrees. No catching with McMurray maneuver. No patellar apprehension. Negative anterior drawer and lachman's- no laxity appreciated.  No calf tenderness of lower leg edema bilaterally.   Left knee: Able to extend to -5 to 10 degrees and flex to 110 degrees. No catching with McMurray maneuver. No patellar apprehension. Negative anterior drawer and lachman's- no laxity appreciated.  Skin:    General: Skin is warm and dry.  Neurological:     Mental Status: She is alert.  Psychiatric:        Speech: Speech normal.        Behavior: Behavior normal.        Thought Content: Thought content normal.        Assessment & Plan:   Problem List Items Addressed This Visit      Other   Chronic pain of both knees - Primary    Worsening, suspect lack of activity, weight gain contributory.  Prescribed Mobic to be used as needed.  Advise short trial this medication.  Declines x-ray of left knee.  If no improvement with ice, neoprene sleeve, home exercises as prescribed, we will consult orthopedics.  Advised physical therapy however patient politely declines today.      Relevant Medications   meloxicam (MOBIC) 7.5 MG tablet   Right wrist pain    Suspect arthritic in nature based on presentation, exam today.  Advised that would be prudent to pursue autoimmune labs for rheumatoid however patient politely declines at this time due to cost.  Declines x-ray today.  She prefers to start conservative therapy, we can discuss this at future visits.  Meloxicam prescribed, advised icing regimen.  Will consider orthopedics if no improvement.          I have discontinued Chondra E. Passmore's benzonatate. I am also having her start on meloxicam. Additionally, I am having her maintain her cetirizine, diphenhydrAMINE, latanoprost,  timolol, Melatonin, promethazine,  SUMAtriptan, PARoxetine, nortriptyline, amLODipine, propranolol, diclofenac sodium, cefdinir, albuterol, levofloxacin, baclofen, and famotidine. We will continue to administer testosterone cypionate, testosterone cypionate, testosterone cypionate, and testosterone cypionate.   Meds ordered this encounter  Medications  . meloxicam (MOBIC) 7.5 MG tablet    Sig: Take 1 tablet (7.5 mg total) by mouth daily as needed for pain. With food    Dispense:  90 tablet    Refill:  0    Order Specific Question:   Supervising Provider    Answer:   Crecencio Mc [[id number]]    Return precautions given.   Risks, benefits, and alternatives of the medications and treatment plan prescribed today were discussed, and patient expressed understanding.   Education regarding symptom management and diagnosis given to patient on AVS.  Continue to follow with Burnard Hawthorne, FNP for routine health maintenance.   [name] and I agreed with plan.   Mable Paris, FNP

## 2018-11-17 NOTE — Patient Instructions (Addendum)
Let me know if in the future you would like me to do labs in regards to rheumatoid arthritis. For now, we will treat you for osteoarthritis. Trial of meloxicam for arthritic pain.  Ice ice ice as we discussed today.  Knee exercises provided. Lets  also focus on weight loss.   Happy holidays!

## 2018-11-27 ENCOUNTER — Telehealth: Payer: Self-pay | Admitting: *Deleted

## 2018-11-27 DIAGNOSIS — G8929 Other chronic pain: Secondary | ICD-10-CM

## 2018-11-27 DIAGNOSIS — M25562 Pain in left knee: Principal | ICD-10-CM

## 2018-11-27 DIAGNOSIS — M25561 Pain in right knee: Principal | ICD-10-CM

## 2018-11-27 NOTE — Telephone Encounter (Signed)
Copied from [place] [id number]. Topic: General - Inquiry >> Nov 27, 2018 11:25 AM Rayann Heman wrote: Reason for CRM: pt called and stated that she [place] [facility] [place] [facility]. Pt states that her and Arnett discussed getting lab work done for rheumatoid arthritis and would like to know if she can have paperwork filled out so she can get it done on 12/11/17. Pt states that cream was given for the pain but it is not helping at all.  Please advise

## 2018-11-27 NOTE — Telephone Encounter (Signed)
Pt called [facility] Reason for CRM: pt called and stated that she is having blood work [place] [facility]. Pt states that her and Arnett discussed getting lab work done for rheumatoid arthritis and would like to know if she can have paperwork filled out so she can get it done on 12/11/17. Pt states that cream was given for the pain but it is not helping at all.  Please advise

## 2018-12-04 ENCOUNTER — Encounter: Payer: Self-pay | Admitting: Family

## 2018-12-04 ENCOUNTER — Other Ambulatory Visit: Payer: Self-pay

## 2018-12-04 DIAGNOSIS — M25562 Pain in left knee: Principal | ICD-10-CM

## 2018-12-04 DIAGNOSIS — M25561 Pain in right knee: Principal | ICD-10-CM

## 2018-12-04 DIAGNOSIS — G8929 Other chronic pain: Secondary | ICD-10-CM

## 2018-12-04 NOTE — Telephone Encounter (Signed)
I have ordered & printed requisitions for patient. I have put them upfront for pick up. Patient notified that she can pick them up there.

## 2018-12-04 NOTE — Telephone Encounter (Signed)
We need labs for   ANA RF CRP Sed Rate  Done at [facility]  I would adbise ortho ref as she may need an injection. Placed. Advise her to call us if she doesn't hear from [place] with an appt

## 2018-12-06 ENCOUNTER — Telehealth: Payer: Self-pay

## 2018-12-06 NOTE — Telephone Encounter (Signed)
Copied from [place] [id number]. Topic: General - Inquiry >> Dec 06, 2018  4:26 PM Yvonne [name] wrote: Reason for CRM:: Patient states that she will be by sometime this week to get her orders to get her lab work done at [facility]

## 2018-12-11 ENCOUNTER — Ambulatory Visit: Payer: Self-pay | Admitting: Emergency Medicine

## 2018-12-11 ENCOUNTER — Other Ambulatory Visit: Payer: Self-pay | Admitting: Internal Medicine

## 2018-12-11 ENCOUNTER — Encounter: Payer: Self-pay | Admitting: Emergency Medicine

## 2018-12-11 ENCOUNTER — Other Ambulatory Visit: Payer: Self-pay

## 2018-12-11 VITALS — BP 138/88 | Ht 62.0 in | Wt 227.0 lb

## 2018-12-11 DIAGNOSIS — M62838 Other muscle spasm: Secondary | ICD-10-CM

## 2018-12-11 DIAGNOSIS — G8929 Other chronic pain: Secondary | ICD-10-CM

## 2018-12-11 DIAGNOSIS — Z0189 Encounter for other specified special examinations: Principal | ICD-10-CM

## 2018-12-11 DIAGNOSIS — Z008 Encounter for other general examination: Secondary | ICD-10-CM

## 2018-12-11 DIAGNOSIS — M25562 Pain in left knee: Secondary | ICD-10-CM

## 2018-12-11 NOTE — Patient Instructions (Signed)
Please stay up to date on your mammograms. Discussed with your provider having your colonoscopy. We will call you with results of your cholesterol and glucose and forwarded your other test to your PCP.    DASH Eating Plan DASH stands for "Dietary Approaches to Stop Hypertension." The DASH eating plan is a healthy eating plan that has been shown to reduce high blood pressure (hypertension). It may also reduce your risk for type 2 diabetes, heart disease, and stroke. The DASH eating plan may also help with weight loss. What are tips for following this plan?  General guidelines  Avoid eating more than 2,300 mg (milligrams) of salt (sodium) a day. If you have hypertension, you may need to reduce your sodium intake to 1,500 mg a day.  Limit alcohol intake to no more than 1 drink a day for nonpregnant women and 2 drinks a day for men. One drink equals 12 oz of beer, 5 oz of wine, or 1 oz of hard liquor.  Work with your health care provider to maintain a healthy body weight or to lose weight. Ask what an ideal weight is for you.  Get at least 30 minutes of exercise that causes your heart to beat faster (aerobic exercise) most days of the week. Activities may include walking, swimming, or biking.  Work with your health care provider or diet and nutrition specialist (dietitian) to adjust your eating plan to your individual calorie needs. Reading food labels   Check food labels for the amount of sodium per serving. Choose foods with less than 5 percent of the Daily Value of sodium. Generally, foods with less than 300 mg of sodium per serving fit into this eating plan.  To find whole grains, look for the word "whole" as the first word in the ingredient list. Shopping  Buy products labeled as "low-sodium" or "no salt added."  Buy fresh foods. Avoid canned foods and premade or frozen meals. Cooking  Avoid adding salt when cooking. Use salt-free seasonings or herbs instead of table salt or sea  salt. Check with your health care provider or pharmacist before using salt substitutes.  Do not fry foods. Cook foods using healthy methods such as baking, boiling, grilling, and broiling instead.  Cook with heart-healthy oils, such as olive, canola, soybean, or sunflower oil. Meal planning  Eat a balanced diet that includes: ? 5 or more servings of fruits and vegetables each day. At each meal, try to fill half of your plate with fruits and vegetables. ? Up to 6-8 servings of whole grains each day. ? Less than 6 oz of lean meat, poultry, or fish each day. A 3-oz serving of meat is about the same size as a deck of cards. One egg equals 1 oz. ? 2 servings of low-fat dairy each day. ? A serving of nuts, seeds, or beans 5 times each week. ? Heart-healthy fats. Healthy fats called Omega-3 fatty acids are found in foods such as flaxseeds and coldwater fish, like sardines, salmon, and mackerel.  Limit how much you eat of the following: ? Canned or prepackaged foods. ? Food that is high in trans fat, such as fried foods. ? Food that is high in saturated fat, such as fatty meat. ? Sweets, desserts, sugary drinks, and other foods with added sugar. ? Full-fat dairy products.  Do not salt foods before eating.  Try to eat at least 2 vegetarian meals each week.  Eat more home-cooked food and less restaurant, buffet, and fast food.  When eating at a restaurant, ask that your food be prepared with less salt or no salt, if possible. What foods are recommended? The items listed may not be a complete list. Talk with your dietitian about what dietary choices are best for you. Grains Whole-grain or whole-wheat bread. Whole-grain or whole-wheat pasta. Brown rice. Modena Morrow. Bulgur. Whole-grain and low-sodium cereals. Pita bread. Low-fat, low-sodium crackers. Whole-wheat flour tortillas. Vegetables Fresh or frozen vegetables (raw, steamed, roasted, or grilled). Low-sodium or reduced-sodium tomato  and vegetable juice. Low-sodium or reduced-sodium tomato sauce and tomato paste. Low-sodium or reduced-sodium canned vegetables. Fruits All fresh, dried, or frozen fruit. Canned fruit in natural juice (without added sugar). Meat and other protein foods Skinless chicken or [place]. Ground chicken or [place]. Pork with fat trimmed off. Fish and seafood. Egg whites. Dried beans, peas, or lentils. Unsalted nuts, nut butters, and seeds. Unsalted canned beans. Lean cuts of beef with fat trimmed off. Low-sodium, lean deli meat. Dairy Low-fat (1%) or fat-free (skim) milk. Fat-free, low-fat, or reduced-fat cheeses. Nonfat, low-sodium ricotta or cottage cheese. Low-fat or nonfat yogurt. Low-fat, low-sodium cheese. Fats and oils Soft margarine without trans fats. Vegetable oil. Low-fat, reduced-fat, or light mayonnaise and salad dressings (reduced-sodium). Canola, safflower, olive, soybean, and sunflower oils. Avocado. Seasoning and other foods Herbs. Spices. Seasoning mixes without salt. Unsalted popcorn and pretzels. Fat-free sweets. What foods are not recommended? The items listed may not be a complete list. Talk with your dietitian about what dietary choices are best for you. Grains Baked goods made with fat, such as croissants, muffins, or some breads. Dry pasta or rice meal packs. Vegetables Creamed or fried vegetables. Vegetables in a cheese sauce. Regular canned vegetables (not low-sodium or reduced-sodium). Regular canned tomato sauce and paste (not low-sodium or reduced-sodium). Regular tomato and vegetable juice (not low-sodium or reduced-sodium). Angie Fava. Olives. Fruits Canned fruit in a light or heavy syrup. Fried fruit. Fruit in cream or butter sauce. Meat and other protein foods Fatty cuts of meat. Ribs. Fried meat. Berniece Salines. Sausage. Bologna and other processed lunch meats. Salami. Fatback. Hotdogs. Bratwurst. Salted nuts and seeds. Canned beans with added salt. Canned or smoked fish. Whole eggs  or egg yolks. Chicken or [place] with skin. Dairy Whole or 2% milk, cream, and half-and-half. Whole or full-fat cream cheese. Whole-fat or sweetened yogurt. Full-fat cheese. Nondairy creamers. Whipped toppings. Processed cheese and cheese spreads. Fats and oils Butter. Stick margarine. Lard. Shortening. Ghee. Bacon fat. Tropical oils, such as coconut, palm kernel, or palm oil. Seasoning and other foods Salted popcorn and pretzels. Onion salt, garlic salt, seasoned salt, table salt, and sea salt. Worcestershire sauce. Tartar sauce. Barbecue sauce. Teriyaki sauce. Soy sauce, including reduced-sodium. Steak sauce. Canned and packaged gravies. Fish sauce. Oyster sauce. Cocktail sauce. Horseradish that you find on the shelf. Ketchup. Mustard. Meat flavorings and tenderizers. Bouillon cubes. Hot sauce and Tabasco sauce. Premade or packaged marinades. Premade or packaged taco seasonings. Relishes. Regular salad dressings. Where to find more information:  National Heart, Lung, and [place]: https://wilson-eaton.com/  American Heart Association: www.heart.org Summary  The DASH eating plan is a healthy eating plan that has been shown to reduce high blood pressure (hypertension). It may also reduce your risk for type 2 diabetes, heart disease, and stroke.  With the DASH eating plan, you should limit salt (sodium) intake to 2,300 mg a day. If you have hypertension, you may need to reduce your sodium intake to 1,500 mg a day.  When on the DASH eating plan,  aim to eat more fresh fruits and vegetables, whole grains, lean proteins, low-fat dairy, and heart-healthy fats.  Work with your health care provider or diet and nutrition specialist (dietitian) to adjust your eating plan to your individual calorie needs. This information is not intended to replace advice given to you by your health care provider. Make sure you discuss any questions you have with your health care provider. Document Released: 11/04/2011  Document Revised: 11/08/2016 Document Reviewed: 11/08/2016 Elsevier Interactive Patient Education  2019 [employer].

## 2018-12-11 NOTE — Progress Notes (Signed)
Subjective: Patient here for biometric evaluation.  She also has a lab slip to do routine labs for her PCP.  She also has a OB/GYN doctor she sees.  She initially was on testosterone shots for libido which did help but caused unwanted side effects with hair growth and so this was stopped.  She overall is doing well her major problem has been her weight and arthritis in her knees.  She has worked on diet and trying to get back to an exercise program once her knee pain is improved. Social habits: Non-smoker. Nondrinker. Working on diet and exercise for weight loss. No drug use. Objective There were no vitals filed for this visit. There is no height or weight on file to calculate BMI. Today's Vitals   12/11/18 1006  BP: 138/88  Weight: 227 lb (103 kg)  Height: 5\' 2"  (1.575 m)   Body mass index is 41.52 kg/m.   Alert cooperative in no distress. Neck supple without adenopathy or thyromegaly. Chest clear to auscultation. Heart regular rate and rhythm. Extremities no edema. Assessment: Patient will continue to focus on diet and weight loss.  She is now 83 and needs to be scheduled for her colonoscopy.  She declined a flu shot today.  She states she is up-to-date with her tetanus immunizations Plan: Continue follow-up with OB GYN and stay up-to-date on mammograms. Encourage flu shot which she declined. Advised patient to discuss with her PCP having a colonoscopy. Gave her copies of Dash diet to consider.Marland Kitchen

## 2018-12-12 ENCOUNTER — Other Ambulatory Visit: Payer: Self-pay | Admitting: Family

## 2018-12-12 LAB — ANA COMPREHENSIVE PANEL
Anti JO-1: <0.2 AI (ref 0.0–0.9)
Centromere Ab Screen: <0.2 AI (ref 0.0–0.9)
Chromatin Ab SerPl-aCnc: <0.2 AI (ref 0.0–0.9)
ENA RNP Ab: <0.2 AI (ref 0.0–0.9)
ENA SM Ab Ser-aCnc: <0.2 AI (ref 0.0–0.9)
ENA SSA (RO) Ab: <0.2 AI (ref 0.0–0.9)
ENA SSB (LA) Ab: <0.2 AI (ref 0.0–0.9)
Scleroderma SCL-70: <0.2 AI (ref 0.0–0.9)
dsDNA Ab: <1 [IU]/mL (ref 0–9)

## 2018-12-12 LAB — RHEUMATOID FACTOR: Rheumatoid fact SerPl-aCnc: 59.1 IU/mL — ABNORMAL HIGH (ref 0.0–13.9)

## 2018-12-12 LAB — LIPID PANEL WITH LDL/HDL RATIO
Cholesterol, Total: 281 mg/dL — ABNORMAL HIGH (ref 100–199)
HDL: 48 mg/dL (ref >39)
LDL Calculated: 195 mg/dL — ABNORMAL HIGH (ref 0–99)
LDL/HDL RATIO: 4.1 ratio — AB (ref 0.0–3.2)
Triglycerides: 188 mg/dL — ABNORMAL HIGH (ref 0–149)
VLDL Cholesterol Cal: 38 mg/dL (ref 5–40)

## 2018-12-12 LAB — C-REACTIVE PROTEIN: CRP: 2 mg/L (ref 0–10)

## 2018-12-12 LAB — GLUCOSE, RANDOM: Glucose: 109 mg/dL — ABNORMAL HIGH (ref 65–99)

## 2018-12-12 LAB — SEDIMENTATION RATE: Sed Rate: 44 mm/h — ABNORMAL HIGH (ref 0–40)

## 2018-12-12 NOTE — Progress Notes (Signed)
Lab Results from 12/11/2018 have been routed to the Ordering Provider Mable Paris MD 12/12/2018 - [place]

## 2018-12-12 NOTE — Telephone Encounter (Signed)
LMTCB to see if patient was still taking medication.

## 2018-12-13 ENCOUNTER — Other Ambulatory Visit: Payer: Self-pay | Admitting: Internal Medicine

## 2018-12-13 ENCOUNTER — Other Ambulatory Visit: Payer: Self-pay | Admitting: Family

## 2018-12-13 ENCOUNTER — Other Ambulatory Visit: Payer: Self-pay

## 2018-12-13 DIAGNOSIS — M069 Rheumatoid arthritis, unspecified: Secondary | ICD-10-CM

## 2018-12-13 DIAGNOSIS — K219 Gastro-esophageal reflux disease without esophagitis: Secondary | ICD-10-CM

## 2018-12-13 DIAGNOSIS — M62838 Other muscle spasm: Secondary | ICD-10-CM

## 2018-12-13 MED ORDER — FAMOTIDINE 10 MG PO TABS: 10.0000 mg | ORAL_TABLET | Freq: Two times a day (BID) | ORAL | 1 refills | Status: DC

## 2018-12-13 NOTE — Telephone Encounter (Signed)
Pt states she is still taking this med ranitidine (ZANTAC) 150 MG tablet  However, pt never picked up the famotidine (PEPCID AC) 10 MG tablet I did advise pt it is at the pharmacy if she was to switch to this due to recall.   Pt also requested  baclofen (LIORESAL) 10 MG tablet  [facility] [place], [place] [phone] (Phone) [phone] (Fax)

## 2018-12-15 ENCOUNTER — Other Ambulatory Visit: Payer: Self-pay

## 2018-12-15 ENCOUNTER — Encounter: Payer: Self-pay | Admitting: Family

## 2018-12-15 DIAGNOSIS — R7303 Prediabetes: Secondary | ICD-10-CM | POA: Insufficient documentation

## 2018-12-15 MED ORDER — PAROXETINE HCL 20 MG PO TABS: 20.0000 mg | ORAL_TABLET | Freq: Every day | ORAL | 1 refills | Status: DC

## 2018-12-18 ENCOUNTER — Other Ambulatory Visit: Payer: Self-pay

## 2018-12-18 ENCOUNTER — Telehealth: Payer: Self-pay

## 2018-12-18 NOTE — Telephone Encounter (Signed)
Patient returned call and stated she is still taking, her pharmacy has the ones without the cancer in them

## 2018-12-18 NOTE — Telephone Encounter (Signed)
[place] to ask if patient was still taking rantidine 150 mg.We received a refill request for recalled mediation from [employer].

## 2018-12-20 ENCOUNTER — Other Ambulatory Visit: Payer: Self-pay | Admitting: Family

## 2018-12-20 DIAGNOSIS — R519 Headache, unspecified: Secondary | ICD-10-CM

## 2018-12-20 DIAGNOSIS — R51 Headache: Principal | ICD-10-CM

## 2018-12-20 DIAGNOSIS — I10 Essential (primary) hypertension: Secondary | ICD-10-CM

## 2018-12-26 ENCOUNTER — Telehealth: Payer: Self-pay

## 2018-12-26 NOTE — Telephone Encounter (Signed)
Copied from [place] [id number]. Topic: Referral - Status >> Dec 26, 2018  8:39 AM Sheran Luz wrote: Reason for CRM: Patient calling to check status of rheumatology referral. Patient states she was advised to contact [facility] clinic and they stated that they have not received a referral for patient. Please advise.

## 2018-12-27 NOTE — Telephone Encounter (Signed)
Resubmitted referral to [facility] through [facility].

## 2018-12-28 ENCOUNTER — Encounter: Payer: Self-pay | Admitting: Family

## 2019-01-06 ENCOUNTER — Other Ambulatory Visit: Payer: Self-pay | Admitting: Family

## 2019-01-06 DIAGNOSIS — M62838 Other muscle spasm: Secondary | ICD-10-CM

## 2019-01-07 ENCOUNTER — Other Ambulatory Visit: Payer: Self-pay | Admitting: Family

## 2019-01-07 DIAGNOSIS — M62838 Other muscle spasm: Secondary | ICD-10-CM

## 2019-01-10 MED ORDER — BACLOFEN 10 MG PO TABS: 10.0000 mg | ORAL_TABLET | Freq: Every day | ORAL | 0 refills | Status: DC | PRN

## 2019-01-12 ENCOUNTER — Other Ambulatory Visit: Payer: Self-pay | Admitting: Family

## 2019-01-12 DIAGNOSIS — M25562 Pain in left knee: Principal | ICD-10-CM

## 2019-01-12 DIAGNOSIS — G8929 Other chronic pain: Secondary | ICD-10-CM

## 2019-01-12 DIAGNOSIS — M25561 Pain in right knee: Principal | ICD-10-CM

## 2019-01-12 NOTE — Telephone Encounter (Signed)
Last OV 11/17/18 and last refill 12/16/18.  Okay to fill?

## 2019-01-16 ENCOUNTER — Other Ambulatory Visit: Payer: Self-pay | Admitting: Family

## 2019-01-16 DIAGNOSIS — I10 Essential (primary) hypertension: Secondary | ICD-10-CM

## 2019-01-16 DIAGNOSIS — R519 Headache, unspecified: Secondary | ICD-10-CM

## 2019-01-16 DIAGNOSIS — R51 Headache: Principal | ICD-10-CM

## 2019-01-17 ENCOUNTER — Other Ambulatory Visit: Payer: Self-pay

## 2019-01-17 DIAGNOSIS — M2550 Pain in unspecified joint: Secondary | ICD-10-CM

## 2019-01-17 DIAGNOSIS — R768 Other specified abnormal immunological findings in serum: Secondary | ICD-10-CM

## 2019-01-19 ENCOUNTER — Encounter: Payer: Self-pay | Admitting: Family

## 2019-01-19 ENCOUNTER — Ambulatory Visit: Payer: Managed Care, Other (non HMO) | Admitting: Family

## 2019-01-19 ENCOUNTER — Ambulatory Visit: Payer: Self-pay | Admitting: Family

## 2019-01-19 VITALS — BP 106/71 | HR 78 | Temp 98.5°F | Wt 227.6 lb

## 2019-01-19 DIAGNOSIS — M25531 Pain in right wrist: Secondary | ICD-10-CM

## 2019-01-19 DIAGNOSIS — Z1211 Encounter for screening for malignant neoplasm of colon: Secondary | ICD-10-CM | POA: Insufficient documentation

## 2019-01-19 DIAGNOSIS — M25561 Pain in right knee: Secondary | ICD-10-CM | POA: Diagnosis not present

## 2019-01-19 DIAGNOSIS — Z1239 Encounter for other screening for malignant neoplasm of breast: Secondary | ICD-10-CM

## 2019-01-19 DIAGNOSIS — J3089 Other allergic rhinitis: Secondary | ICD-10-CM | POA: Insufficient documentation

## 2019-01-19 DIAGNOSIS — M25562 Pain in left knee: Secondary | ICD-10-CM

## 2019-01-19 DIAGNOSIS — I10 Essential (primary) hypertension: Secondary | ICD-10-CM

## 2019-01-19 DIAGNOSIS — G8929 Other chronic pain: Secondary | ICD-10-CM

## 2019-01-19 DIAGNOSIS — M62838 Other muscle spasm: Secondary | ICD-10-CM

## 2019-01-19 DIAGNOSIS — R519 Headache, unspecified: Secondary | ICD-10-CM

## 2019-01-19 DIAGNOSIS — Z79899 Other long term (current) drug therapy: Secondary | ICD-10-CM | POA: Insufficient documentation

## 2019-01-19 DIAGNOSIS — R51 Headache: Secondary | ICD-10-CM

## 2019-01-19 DIAGNOSIS — K219 Gastro-esophageal reflux disease without esophagitis: Secondary | ICD-10-CM

## 2019-01-19 DIAGNOSIS — N951 Menopausal and female climacteric states: Secondary | ICD-10-CM

## 2019-01-19 LAB — COMPREHENSIVE METABOLIC PANEL
A/G RATIO: 1.6 (ref 1.2–2.2)
ALBUMIN: 4.4 g/dL (ref 3.8–4.8)
ALT: 24 IU/L (ref 0–32)
AST: 25 IU/L (ref 0–40)
Alkaline Phosphatase: 130 IU/L — ABNORMAL HIGH (ref 39–117)
BUN/Creatinine Ratio: 34 — ABNORMAL HIGH (ref 9–23)
BUN: 23 mg/dL (ref 6–24)
Bilirubin Total: 0.4 mg/dL (ref 0.0–1.2)
CO2: 25 mmol/L (ref 20–29)
Calcium: 10.2 mg/dL (ref 8.7–10.2)
Chloride: 103 mmol/L (ref 96–106)
Creatinine, Ser: 0.68 mg/dL (ref 0.57–1.00)
GFR calc Af Amer: 118 mL/min/{1.73_m2} (ref >59)
GFR, EST NON AFRICAN AMERICAN: 102 mL/min/{1.73_m2} (ref >59)
Globulin, Total: 2.7 g/dL (ref 1.5–4.5)
Glucose: 97 mg/dL (ref 65–99)
POTASSIUM: 4.8 mmol/L (ref 3.5–5.2)
Sodium: 141 mmol/L (ref 134–144)
Total Protein: 7.1 g/dL (ref 6.0–8.5)

## 2019-01-19 LAB — CBC WITH DIFFERENTIAL/PLATELET
Basophils Absolute: 0 10*3/uL (ref 0.0–0.2)
Basos: 1 %
EOS (ABSOLUTE): 0 10*3/uL (ref 0.0–0.4)
EOS: 1 %
Hematocrit: 43.9 % (ref 34.0–46.6)
Hemoglobin: 15.4 g/dL (ref 11.1–15.9)
Immature Grans (Abs): 0 10*3/uL (ref 0.0–0.1)
Immature Granulocytes: 0 %
LYMPHS ABS: 1.1 10*3/uL (ref 0.7–3.1)
Lymphs: 33 %
MCH: 30 pg (ref 26.6–33.0)
MCHC: 35.1 g/dL (ref 31.5–35.7)
MCV: 85 fL (ref 79–97)
Monocytes Absolute: 0.4 10*3/uL (ref 0.1–0.9)
Monocytes: 11 %
Neutrophils Absolute: 1.9 10*3/uL (ref 1.4–7.0)
Neutrophils: 54 %
Platelets: 253 10*3/uL (ref 150–450)
RBC: 5.14 x10E6/uL (ref 3.77–5.28)
RDW: 12.8 % (ref 11.7–15.4)
WBC: 3.5 10*3/uL (ref 3.4–10.8)

## 2019-01-19 LAB — CYCLIC CITRUL PEPTIDE ANTIBODY, IGG/IGA: Cyclic Citrullin Peptide Ab: 9 units (ref 0–19)

## 2019-01-19 LAB — C-REACTIVE PROTEIN: CRP: 4 mg/L (ref 0–10)

## 2019-01-19 LAB — HEPATITIS B SURFACE ANTIBODY,QUALITATIVE: Hep B Surface Ab, Qual: NONREACTIVE

## 2019-01-19 LAB — SEDIMENTATION RATE: Sed Rate: 45 mm/hr — ABNORMAL HIGH (ref 0–40)

## 2019-01-19 MED ORDER — MONTELUKAST SODIUM 10 MG PO TABS: 10.0000 mg | ORAL_TABLET | Freq: Every day | ORAL | 3 refills | Status: DC

## 2019-01-19 MED ORDER — NORTRIPTYLINE HCL 10 MG PO CAPS: 10.0000 mg | ORAL_CAPSULE | Freq: Every day | ORAL | 1 refills | Status: DC

## 2019-01-19 MED ORDER — BACLOFEN 10 MG PO TABS: 10.0000 mg | ORAL_TABLET | Freq: Every day | ORAL | 0 refills | Status: DC | PRN

## 2019-01-19 MED ORDER — MELOXICAM 7.5 MG PO TABS: 7.5000 mg | ORAL_TABLET | Freq: Every day | ORAL | 0 refills | Status: DC | PRN

## 2019-01-19 MED ORDER — AMLODIPINE BESYLATE 5 MG PO TABS: 5.0000 mg | ORAL_TABLET | Freq: Every day | ORAL | 3 refills | Status: DC

## 2019-01-19 NOTE — Assessment & Plan Note (Signed)
Doing well on paxil, will continue.

## 2019-01-19 NOTE — Assessment & Plan Note (Addendum)
Improved with mobic, suspect chronic neck pain was a trigger. Wean down  ( or off) the pamelor as doesn't think effective. Advised to limit mobic in the future , once joint pain has improved ( established with rheumatology).

## 2019-01-19 NOTE — Assessment & Plan Note (Signed)
Ordered.  Patient will schedule. 

## 2019-01-19 NOTE — Assessment & Plan Note (Signed)
Suspected RA. Following with Rheumatology.

## 2019-01-19 NOTE — Patient Instructions (Addendum)
Goal to use mobic as needed once joint pain has eased off.   Wean down on notriptyyline and monitor for headaches recurrence.   Start 40mg  for one week,   30mg  for one week, then  20 mg one week. Then may stay on 10mg  nortriptyline if you would like OR may decide to come off. You may do this by taking 10mg  every other day for one week and then stop.   Stop benadryl at bedtime.   Take singulair at bedtime and claritin in the mornings. Let me know if no improvement.   Today we discussed referrals, orders. GI for colonoscopy, upper endoscopy   I have placed these orders in the system for you.  Please be sure to give [place] a call if you have not heard from our office regarding this. We should hear from [place] within ONE week with information regarding your appointment. If not, please let me know immediately.     We placed a referral for mammogram this year. I asked that you call one the below locations and schedule this when it is convenient for you.   As discussed, I would like you to ask for 3D mammogram over the traditional 2D mammogram as new evidence suggest 3D is superior.   Please note that NOT all insurance companies cover 3D and you may have to pay a higher copay. You may call your insurance company to further clarify your benefits.   Options for [place]  [place], [place]  * [facility] [place], [place] * Note if you ask for 3D mammogram at this location, you must request [place], [place] location*

## 2019-01-19 NOTE — Assessment & Plan Note (Signed)
Stable. Continue regimen. 

## 2019-01-19 NOTE — Assessment & Plan Note (Signed)
Referral to GI  

## 2019-01-19 NOTE — Assessment & Plan Note (Signed)
Trial of singulair. Stay on claritin. Stop benadryl. Will consult allergy if no improvement.

## 2019-01-19 NOTE — Assessment & Plan Note (Signed)
Continue regimen, stable

## 2019-01-19 NOTE — Progress Notes (Signed)
Subjective:    Patient ID: Yvonne [name], female    DOB: [birth year], 53 y.o.   MRN: [id number]  CC: Yvonne [name] is a 53 y.o. female who presents today for follow up.   HPI: Has seen Rheumatology, Dr Meda Coffee. Feeling so much better.   Has started her on prednisone taper.  HTN- compliant with medications. No cp, sob.   Arthralgia-Improved.  prn baclofen, daily mobic. Low back, knee, and neck pain.  Joint swelling has improved.   Migraines- improved.  ' are few and far between.' Thinks neck pain was causing HA.   Would like to come off the nortriptyline 50 qhs, or at least wean down.   GERD- Doing well on pepcid ac. no trouble swallowing or pain with swallowing. Non smoker.   Hot flashes- doing well on paxil. Follows with with Dr Marcelline Mates.   Allergic rhinitis- Chronic nasal congestion. Using claritin, benadryl at night still with breakthrough post nasal drip and congestion. Cannot use flonase.       HISTORY:  Past Medical History:  Diagnosis Date  . Allergy   . GERD (gastroesophageal reflux disease)   . Gestational diabetes    patient denies  . Glaucoma   . H/O bronchitis   . Headache    migraine  . History of ovarian cyst   . Hypertension    pre-eclampsia with first child  . Increased pressure in the eye, bilateral    Past Surgical History:  Procedure Laterality Date  . CERVICAL POLYPECTOMY N/A 04/11/2017   Procedure: CERVICAL POLYPECTOMY;  Surgeon: Rubie Maid, MD;  Location: [facility] OR[facility];  Service: Gynecology;  Laterality: N/A;  . HYSTEROSCOPY W/D&C N/A 04/11/2017   Procedure: DILATATION AND CURETTAGE /HYSTEROSCOPY;  Surgeon: Rubie Maid, MD;  Location: [facility] OR[facility];  Service: Gynecology;  Laterality: N/A;  . IUD REMOVAL N/A 05/05/2017   Procedure: INTRAUTERINE DEVICE (IUD) REMOVAL;  Surgeon: Rubie Maid, MD;  Location: [facility] OR[facility];  Service: Gynecology;  Laterality: N/A;  . LAPAROSCOPIC SALPINGO OOPHERECTOMY Bilateral 05/05/2017   Procedure: LAPAROSCOPIC BILATERAL  SALPINGO OOPHORECTOMY;  Surgeon: Rubie Maid, MD;  Location: [facility] OR[facility];  Service: Gynecology;  Laterality: Bilateral;  . LAPAROSCOPY N/A 04/11/2017   Procedure: LAPAROSCOPY DIAGNOSTIC;  Surgeon: Rubie Maid, MD;  Location: [facility];  Service: Gynecology;  Laterality: N/A;  . PARTIAL HYSTERECTOMY     Ovaries and Tubes  . TUBAL LIGATION     Family History  Problem Relation Age of Onset  . Schizophrenia Mother   . Bipolar disorder Mother   . Alcohol abuse Father   . Hypertension Father   . Seizures Paternal Grandmother   . Diabetes Paternal Grandmother   . Hypertension Brother   . Hypertension Maternal Grandmother     Allergies: Flonase [fluticasone propionate]; Sulfa antibiotics; and Tussionex pennkinetic er [hydrocod polst-cpm polst er] Current Outpatient Medications on File Prior to Visit  Medication Sig Dispense Refill  . cetirizine (ZYRTEC) 10 MG tablet Take 10 mg by mouth daily.    . famotidine (PEPCID AC) 10 MG tablet Take 1 tablet (10 mg total) by mouth 2 (two) times daily. 120 tablet 1  . latanoprost (XALATAN) 0.005 % ophthalmic solution Place 1 drop into both eyes at bedtime.    . Melatonin 10 MG TABS Take 10 mg by mouth at bedtime.    Marland Kitchen PARoxetine (PAXIL) 20 MG tablet Take 1 tablet (20 mg total) by mouth daily. 30 tablet 1  . promethazine (PHENERGAN) 25 MG tablet Take 1 tablet (25 mg total) by mouth  every 8 (eight) hours as needed for nausea or vomiting. 20 tablet 0  . propranolol (INDERAL) 40 MG tablet Take 1 tablet (40 mg total) by mouth 2 (two) times daily. 60 tablet 0  . SUMAtriptan (IMITREX) 100 MG tablet May repeat in 2 hours if headache persists or recurs. 10 tablet 6  . timolol (BETIMOL) 0.25 % ophthalmic solution Place 1 drop into both eyes daily.     No current facility-administered medications on file prior to visit.     Social History   Tobacco Use  . Smoking status: Former Smoker    Packs/day: 1.00    Years: 6.00    Pack years: 6.00    Types:  Cigarettes    Last attempt to quit: 05/29/2000    Years since quitting: 18.6  . Smokeless tobacco: Never Used  . Tobacco comment: 1/2-1 PPD  Substance Use Topics  . Alcohol use: No    Alcohol/week: 0.0 standard drinks  . Drug use: No    Review of Systems  Constitutional: Negative for chills and fever.  HENT: Positive for congestion, postnasal drip and rhinorrhea.   Respiratory: Negative for cough.   Cardiovascular: Negative for chest pain and palpitations.  Gastrointestinal: Negative for nausea and vomiting.  Musculoskeletal: Positive for arthralgias. Negative for joint swelling (improved).      Objective:    BP 106/71 (BP Location: Left Arm, Patient Position: Sitting, Cuff Size: Large)   Pulse 78   Temp 98.5 F (36.9 C)   Wt 227 lb 9.6 oz (103.2 kg)   LMP  (LMP Unknown)   SpO2 98%   BMI 41.63 kg/m  BP Readings from Last 3 Encounters:  01/19/19 106/71  12/11/18 138/88  11/17/18 124/82   Wt Readings from Last 3 Encounters:  01/19/19 227 lb 9.6 oz (103.2 kg)  12/11/18 227 lb (103 kg)  11/17/18 230 lb (104.3 kg)    Physical Exam Vitals signs reviewed.  Constitutional:      Appearance: She is well-developed.  Eyes:     Conjunctiva/sclera: Conjunctivae normal.  Cardiovascular:     Rate and Rhythm: Normal rate and regular rhythm.     Pulses: Normal pulses.     Heart sounds: Normal heart sounds.  Pulmonary:     Effort: Pulmonary effort is normal.     Breath sounds: Normal breath sounds. No wheezing, rhonchi or rales.  Skin:    General: Skin is warm and dry.  Neurological:     Mental Status: She is alert.  Psychiatric:        Speech: Speech normal.        Behavior: Behavior normal.        Thought Content: Thought content normal.        Assessment & Plan:   Problem List Items Addressed This Visit      Cardiovascular and Mediastinum   Hypertension    Stable. Continue regimen.       Relevant Medications   amLODipine (NORVASC) 5 MG tablet      Respiratory   Non-seasonal allergic rhinitis    Trial of singulair. Stay on claritin. Stop benadryl. Will consult allergy if no improvement.       Relevant Medications   montelukast (SINGULAIR) 10 MG tablet     Digestive   GERD (gastroesophageal reflux disease)    Continue regimen, stable        Other   Nonintractable headache    Improved with mobic, suspect chronic neck pain was a trigger. Wean down  (  or off) the pamelor as doesn't think effective. Advised to limit mobic in the future , once joint pain has improved ( established with rheumatology).       Relevant Medications   meloxicam (MOBIC) 7.5 MG tablet   amLODipine (NORVASC) 5 MG tablet   baclofen (LIORESAL) 10 MG tablet   nortriptyline (PAMELOR) 10 MG capsule   Menopausal hot flushes    Doing well on paxil, will continue.       Chronic pain of both knees   Relevant Medications   meloxicam (MOBIC) 7.5 MG tablet   baclofen (LIORESAL) 10 MG tablet   nortriptyline (PAMELOR) 10 MG capsule   Right wrist pain    Suspected RA. Following with Rheumatology.       Screen for colon cancer - Primary    Referral to GI      Relevant Orders   Ambulatory referral to Gastroenterology   Screening for breast cancer    Ordered. Patient will schedule      Relevant Orders   MM 3D SCREEN BREAST BILATERAL    Other Visit Diagnoses    Muscle spasm       Relevant Medications   baclofen (LIORESAL) 10 MG tablet       I have discontinued Charolette E. Eickhoff's diphenhydrAMINE, diclofenac sodium, cefdinir, albuterol, and levofloxacin. I have also changed her nortriptyline. Additionally, I am having her start on montelukast. Lastly, I am having her maintain her cetirizine, latanoprost, timolol, Melatonin, promethazine, SUMAtriptan, famotidine, PARoxetine, propranolol, meloxicam, amLODipine, and baclofen. We will stop administering testosterone cypionate, testosterone cypionate, testosterone cypionate, and testosterone  cypionate.   Meds ordered this encounter  Medications  . meloxicam (MOBIC) 7.5 MG tablet    Sig: Take 1 tablet (7.5 mg total) by mouth daily as needed for pain. With food    Dispense:  90 tablet    Refill:  0    Order Specific Question:   Supervising Provider    Answer:   Deborra Medina L [[id number]]  . amLODipine (NORVASC) 5 MG tablet    Sig: Take 1 tablet (5 mg total) by mouth daily.    Dispense:  90 tablet    Refill:  3    Order Specific Question:   Supervising Provider    Answer:   Deborra Medina L [[id number]]  . baclofen (LIORESAL) 10 MG tablet    Sig: Take 1 tablet (10 mg total) by mouth daily as needed for muscle spasms.    Dispense:  30 tablet    Refill:  0    Order Specific Question:   Supervising Provider    Answer:   Deborra Medina L [[id number]]  . nortriptyline (PAMELOR) 10 MG capsule    Sig: Take 1 capsule (10 mg total) by mouth at bedtime.    Dispense:  90 capsule    Refill:  1    Order Specific Question:   Supervising Provider    Answer:   Derrel Nip, TERESA L [[id number]]  . montelukast (SINGULAIR) 10 MG tablet    Sig: Take 1 tablet (10 mg total) by mouth at bedtime.    Dispense:  30 tablet    Refill:  3    Order Specific Question:   Supervising Provider    Answer:   Crecencio Mc [[id number]]    Return precautions given.   Risks, benefits, and alternatives of the medications and treatment plan prescribed today were discussed, and patient expressed understanding.   Education regarding symptom management and diagnosis given to patient on AVS.  Continue to follow with Burnard Hawthorne, FNP for routine health maintenance.   [name] and I agreed with plan.   Mable Paris, FNP

## 2019-01-24 ENCOUNTER — Encounter: Payer: Managed Care, Other (non HMO) | Admitting: Obstetrics and Gynecology

## 2019-01-26 DIAGNOSIS — M170 Bilateral primary osteoarthritis of knee: Secondary | ICD-10-CM | POA: Insufficient documentation

## 2019-01-30 ENCOUNTER — Encounter: Payer: Self-pay | Admitting: *Deleted

## 2019-02-04 ENCOUNTER — Encounter: Payer: Self-pay | Admitting: Family

## 2019-02-05 NOTE — Telephone Encounter (Signed)
Please call Dr Francesca Oman office, rheumatology  Is there is any reason pt cannot be on mobic with methatrexate?   Please advise

## 2019-02-08 ENCOUNTER — Other Ambulatory Visit: Payer: Self-pay | Admitting: Family

## 2019-02-08 DIAGNOSIS — K219 Gastro-esophageal reflux disease without esophagitis: Secondary | ICD-10-CM

## 2019-02-12 ENCOUNTER — Other Ambulatory Visit: Payer: Self-pay | Admitting: Family

## 2019-02-12 ENCOUNTER — Encounter: Payer: Self-pay | Admitting: Family

## 2019-02-12 DIAGNOSIS — R51 Headache: Principal | ICD-10-CM

## 2019-02-12 DIAGNOSIS — R519 Headache, unspecified: Secondary | ICD-10-CM

## 2019-02-12 DIAGNOSIS — I10 Essential (primary) hypertension: Secondary | ICD-10-CM

## 2019-02-16 ENCOUNTER — Ambulatory Visit (INDEPENDENT_AMBULATORY_CARE_PROVIDER_SITE_OTHER): Payer: Managed Care, Other (non HMO) | Admitting: Obstetrics and Gynecology

## 2019-02-16 ENCOUNTER — Other Ambulatory Visit: Payer: Self-pay

## 2019-02-16 ENCOUNTER — Encounter: Payer: Self-pay | Admitting: Obstetrics and Gynecology

## 2019-02-16 VITALS — BP 108/73 | HR 74 | Ht 62.0 in | Wt 231.8 lb

## 2019-02-16 DIAGNOSIS — Z1211 Encounter for screening for malignant neoplasm of colon: Secondary | ICD-10-CM | POA: Diagnosis not present

## 2019-02-16 DIAGNOSIS — N952 Postmenopausal atrophic vaginitis: Secondary | ICD-10-CM

## 2019-02-16 DIAGNOSIS — Z01419 Encounter for gynecological examination (general) (routine) without abnormal findings: Secondary | ICD-10-CM | POA: Diagnosis not present

## 2019-02-16 DIAGNOSIS — N951 Menopausal and female climacteric states: Secondary | ICD-10-CM | POA: Diagnosis not present

## 2019-02-16 DIAGNOSIS — Z6841 Body Mass Index (BMI) 40.0 and over, adult: Secondary | ICD-10-CM

## 2019-02-16 DIAGNOSIS — M0579 Rheumatoid arthritis with rheumatoid factor of multiple sites without organ or systems involvement: Secondary | ICD-10-CM

## 2019-02-16 DIAGNOSIS — Z1239 Encounter for other screening for malignant neoplasm of breast: Secondary | ICD-10-CM

## 2019-02-16 DIAGNOSIS — R309 Painful micturition, unspecified: Secondary | ICD-10-CM

## 2019-02-16 DIAGNOSIS — E785 Hyperlipidemia, unspecified: Secondary | ICD-10-CM

## 2019-02-16 NOTE — Patient Instructions (Signed)
Preventive Care 40-64 Years, Female Preventive care refers to lifestyle choices and visits with your health care provider that can promote health and wellness. What does preventive care include?   A yearly physical exam. This is also called an annual well check.  Dental exams once or twice a year.  Routine eye exams. Ask your health care provider how often you should have your eyes checked.  Personal lifestyle choices, including: ? Daily care of your teeth and gums. ? Regular physical activity. ? Eating a healthy diet. ? Avoiding tobacco and drug use. ? Limiting alcohol use. ? Practicing safe sex. ? Taking low-dose aspirin daily starting at age 50. ? Taking vitamin and mineral supplements as recommended by your health care provider. What happens during an annual well check? The services and screenings done by your health care provider during your annual well check will depend on your age, overall health, lifestyle risk factors, and family history of disease. Counseling Your health care provider may ask you questions about your:  Alcohol use.  Tobacco use.  Drug use.  Emotional well-being.  Home and relationship well-being.  Sexual activity.  Eating habits.  Work and work environment.  Method of birth control.  Menstrual cycle.  Pregnancy history. Screening You may have the following tests or measurements:  Height, weight, and BMI.  Blood pressure.  Lipid and cholesterol levels. These may be checked every 5 years, or more frequently if you are over 50 years old.  Skin check.  Lung cancer screening. You may have this screening every year starting at age 55 if you have a 30-pack-year history of smoking and currently smoke or have quit within the past 15 years.  Colorectal cancer screening. All adults should have this screening starting at age 50 and continuing until age 75. Your health care provider may recommend screening at age 45. You will have tests every  1-10 years, depending on your results and the type of screening test. People at increased risk should start screening at an earlier age. Screening tests may include: ? Guaiac-based fecal occult blood testing. ? Fecal immunochemical test (FIT). ? Stool DNA test. ? Virtual colonoscopy. ? Sigmoidoscopy. During this test, a flexible tube with a tiny camera (sigmoidoscope) is used to examine your rectum and lower colon. The sigmoidoscope is inserted through your anus into your rectum and lower colon. ? Colonoscopy. During this test, a long, thin, flexible tube with a tiny camera (colonoscope) is used to examine your entire colon and rectum.  Hepatitis C blood test.  Hepatitis B blood test.  Sexually transmitted disease (STD) testing.  Diabetes screening. This is done by checking your blood sugar (glucose) after you have not eaten for a while (fasting). You may have this done every 1-3 years.  Mammogram. This may be done every 1-2 years. Talk to your health care provider about when you should start having regular mammograms. This may depend on whether you have a family history of breast cancer.  BRCA-related cancer screening. This may be done if you have a family history of breast, ovarian, tubal, or peritoneal cancers.  Pelvic exam and Pap test. This may be done every 3 years starting at age 21. Starting at age 30, this may be done every 5 years if you have a Pap test in combination with an HPV test.  Bone density scan. This is done to screen for osteoporosis. You may have this scan if you are at high risk for osteoporosis. Discuss your test results, treatment options,   and if necessary, the need for more tests with your health care provider. Vaccines Your health care provider may recommend certain vaccines, such as:  Influenza vaccine. This is recommended every year.  Tetanus, diphtheria, and acellular pertussis (Tdap, Td) vaccine. You may need a Td booster every 10 years.  Varicella  vaccine. You may need this if you have not been vaccinated.  Zoster vaccine. You may need this after age 67.  Measles, mumps, and rubella (MMR) vaccine. You may need at least one dose of MMR if you were born in 1957 or later. You may also need a second dose.  Pneumococcal 13-valent conjugate (PCV13) vaccine. You may need this if you have certain conditions and were not previously vaccinated.  Pneumococcal polysaccharide (PPSV23) vaccine. You may need one or two doses if you smoke cigarettes or if you have certain conditions.  Meningococcal vaccine. You may need this if you have certain conditions.  Hepatitis A vaccine. You may need this if you have certain conditions or if you travel or work in places where you may be exposed to hepatitis A.  Hepatitis B vaccine. You may need this if you have certain conditions or if you travel or work in places where you may be exposed to hepatitis B.  Haemophilus influenzae type b (Hib) vaccine. You may need this if you have certain conditions. Talk to your health care provider about which screenings and vaccines you need and how often you need them. This information is not intended to replace advice given to you by your health care provider. Make sure you discuss any questions you have with your health care provider. Document Released: 12/12/2015 Document Revised: 01/05/2018 Document Reviewed: 09/16/2015 Elsevier Interactive Patient Education  2019 [place] Breast self-awareness means:  Knowing how your breasts look.  Knowing how your breasts feel.  Checking your breasts every month for changes.  Telling your doctor if you notice a change in your breasts. Breast self-awareness allows you to notice a breast problem early while it is still small. How to do a breast self-exam One way to learn what is normal for your breasts and to check for changes is to do a breast self-exam. To do a breast self-exam: Look for Changes   1. Take off all the clothes above your waist. 2. Stand in front of a mirror in a room with good lighting. 3. Put your hands on your hips. 4. Push your hands down. 5. Look at your breasts and nipples in the mirror to see if one breast or nipple looks different than the other. Check to see if: ? The shape of one breast is different. ? The size of one breast is different. ? There are wrinkles, dips, and bumps in one breast and not the other. 6. Look at each breast for changes in your skin, such as: ? Redness. ? Scaly areas. 7. Look for changes in your nipples, such as: ? Liquid around the nipples. ? Bleeding. ? Dimpling. ? Redness. ? A change in where the nipples are. Feel for Changes 1. Lie on your back on the floor. 2. Feel each breast. To do this, follow these steps: ? Pick a breast to feel. ? Put the arm closest to that breast above your head. ? Use your other arm to feel the nipple area of your breast. Feel the area with the pads of your three middle fingers by making small circles with your fingers. For the first circle, press lightly. For the second  circle, press harder. For the third circle, press even harder. ? Keep making circles with your fingers at the light, harder, and even harder pressures as you move down your breast. Stop when you feel your ribs. ? Move your fingers a little toward the center of your body. ? Start making circles with your fingers again, this time going up until you reach your collarbone. ? Keep making up and down circles until you reach your armpit. Remember to keep using the three pressures. ? Feel the other breast in the same way. 3. Sit or stand in the shower or tub. 4. With soapy water on your skin, feel each breast the same way you did in step 2, when you were lying on the floor. Write Down What You Find After doing the self-exam, write down:  What is normal for each breast.  Any changes you find in each breast.  When you last had your period.   How often should I check my breasts? Check your breasts every month. If you are breastfeeding, the best time to check them is after you feed your baby or after you use a breast pump. If you get periods, the best time to check your breasts is 5-7 days after your period is over. When should I see my doctor? See your doctor if you notice:  A change in shape or size of your breasts or nipples.  A change in the skin of your breast or nipples, such as red or scaly skin.  Unusual fluid coming from your nipples.  A lump or thick area that was not there before.  Pain in your breasts.  Anything that concerns you. This information is not intended to replace advice given to you by your health care provider. Make sure you discuss any questions you have with your health care provider. Document Released: 05/03/2008 Document Revised: 04/22/2016 Document Reviewed: 10/05/2015 Elsevier Interactive Patient Education  2019 [employer].

## 2019-02-16 NOTE — Progress Notes (Signed)
ANNUAL PREVENTATIVE CARE GYNECOLOGY  ENCOUNTER NOTE  Subjective:       Yvonne [name] is a 53 y.o. G2P2 female here for a routine annual gynecologic exam. The patient is not sexually active. The patient is not taking hormone replacement therapy, however is on Paxil for hot flushes.  She is  Patient denies post-menopausal vaginal bleeding. The patient wears seatbelts: yes. The patient participates in regular exercise: no. Has the patient ever been transfused or tattooed?: no. The patient reports that there is not domestic violence in her life.  Current complaints: 1.  Notes mild irritation with urination and vaginal itching.  Denies vaginal discharge. Symptoms are intermittent.  Does also report vaginal dryness during intercourse for which she uses [place].  2. Patient reports being diagnosed with rheumatoid arthritis recently.  Is currently on steroids and methotrexate for treatment. Notes significant weight gain due to the use of the steroids.    Gynecologic History No LMP recorded (lmp unknown). Patient is postmenopausal. Contraception: status post hysterectomy and is postmenopausal Last Pap: 02/2017. Results were: normal Last mammogram: patient cannot recall. Results were: normal Last Colonoscopy: patient has never had.    Obstetric History OB History  Gravida Para Term Preterm AB Living  2 2       2   SAB TAB Ectopic Multiple Live Births          2    # Outcome Date GA Lbr Len/2nd Weight Sex Delivery Anes PTL Lv  2 Para 1995    F Vag-Spont   LIV  1 Para 1992    M Vag-Spont   LIV    Past Medical History:  Diagnosis Date  . Allergy   . Arthritis, rheumatoid ([place])   . GERD (gastroesophageal reflux disease)   . Gestational diabetes    patient denies  . Glaucoma   . H/O bronchitis   . Headache    migraine  . History of ovarian cyst   . Hypertension    pre-eclampsia with first child  . Increased pressure in the eye, bilateral     Family History  Problem Relation Age  of Onset  . Schizophrenia Mother   . Bipolar disorder Mother   . Alcohol abuse Father   . Hypertension Father   . Seizures Paternal Grandmother   . Diabetes Paternal Grandmother   . Hypertension Brother   . Hypertension Maternal Grandmother     Past Surgical History:  Procedure Laterality Date  . CERVICAL POLYPECTOMY N/A 04/11/2017   Procedure: CERVICAL POLYPECTOMY;  Surgeon: Rubie Maid, MD;  Location: [facility];  Service: Gynecology;  Laterality: N/A;  . HYSTEROSCOPY W/D&C N/A 04/11/2017   Procedure: DILATATION AND CURETTAGE /HYSTEROSCOPY;  Surgeon: Rubie Maid, MD;  Location: [facility];  Service: Gynecology;  Laterality: N/A;  . IUD REMOVAL N/A 05/05/2017   Procedure: INTRAUTERINE DEVICE (IUD) REMOVAL;  Surgeon: Rubie Maid, MD;  Location: [facility];  Service: Gynecology;  Laterality: N/A;  . LAPAROSCOPIC SALPINGO OOPHERECTOMY Bilateral 05/05/2017   Procedure: LAPAROSCOPIC BILATERAL SALPINGO OOPHORECTOMY;  Surgeon: Rubie Maid, MD;  Location: [facility];  Service: Gynecology;  Laterality: Bilateral;  . LAPAROSCOPY N/A 04/11/2017   Procedure: LAPAROSCOPY DIAGNOSTIC;  Surgeon: Rubie Maid, MD;  Location: [facility];  Service: Gynecology;  Laterality: N/A;  . PARTIAL HYSTERECTOMY     Ovaries and Tubes  . TUBAL LIGATION      Social History   Socioeconomic History  . Marital status: Married    Spouse name: Not on file  . Number  of children: Not on file  . Years of education: Not on file  . Highest education level: Not on file  Occupational History  . Occupation: Chemical engineer at [employer]  . Financial resource strain: Not on file  . Food insecurity:    Worry: Not on file    Inability: Not on file  . Transportation needs:    Medical: Not on file    Non-medical: Not on file  Tobacco Use  . Smoking status: Former Smoker    Packs/day: 1.00    Years: 6.00    Pack years: 6.00    Types: Cigarettes    Last attempt to quit: 05/29/2000    Years since  quitting: 18.7  . Smokeless tobacco: Never Used  . Tobacco comment: 1/2-1 PPD  Substance and Sexual Activity  . Alcohol use: No    Alcohol/week: 0.0 standard drinks  . Drug use: No  . Sexual activity: Yes    Birth control/protection: Surgical  Lifestyle  . Physical activity:    Days per week: Not on file    Minutes per session: Not on file  . Stress: Not on file  Relationships  . Social connections:    Talks on phone: Not on file    Gets together: Not on file    Attends religious service: Not on file    Active member of club or organization: Not on file    Attends meetings of clubs or organizations: Not on file    Relationship status: Not on file  . Intimate partner violence:    Fear of current or ex partner: Not on file    Emotionally abused: Not on file    Physically abused: Not on file    Forced sexual activity: Not on file  Other Topics Concern  . Not on file  Social History Narrative  . Not on file    Current Outpatient Medications on File Prior to Visit  Medication Sig Dispense Refill  . amLODipine (NORVASC) 5 MG tablet Take 1 tablet (5 mg total) by mouth daily. 90 tablet 3  . baclofen (LIORESAL) 10 MG tablet Take 1 tablet (10 mg total) by mouth daily as needed for muscle spasms. 30 tablet 0  . cetirizine (ZYRTEC) 10 MG tablet Take 10 mg by mouth daily.    . famotidine (PEPCID AC) 10 MG tablet Take 1 tablet (10 mg total) by mouth 2 (two) times daily. 120 tablet 1  . latanoprost (XALATAN) 0.005 % ophthalmic solution Place 1 drop into both eyes at bedtime.    . Melatonin 10 MG TABS Take 10 mg by mouth at bedtime.    . meloxicam (MOBIC) 7.5 MG tablet Take 1 tablet (7.5 mg total) by mouth daily as needed for pain. With food 90 tablet 0  . montelukast (SINGULAIR) 10 MG tablet Take 1 tablet (10 mg total) by mouth at bedtime. 30 tablet 3  . nortriptyline (PAMELOR) 10 MG capsule Take 1 capsule (10 mg total) by mouth at bedtime. 90 capsule 1  . PARoxetine (PAXIL) 20 MG tablet  Take 1 tablet (20 mg total) by mouth daily. 30 tablet 1  . promethazine (PHENERGAN) 25 MG tablet Take 1 tablet (25 mg total) by mouth every 8 (eight) hours as needed for nausea or vomiting. 20 tablet 0  . propranolol (INDERAL) 40 MG tablet Take 1 tablet (40 mg total) by mouth 2 (two) times daily. 60 tablet 0  . SUMAtriptan (IMITREX) 100 MG tablet May repeat in  2 hours if headache persists or recurs. 10 tablet 6  . timolol (BETIMOL) 0.25 % ophthalmic solution Place 1 drop into both eyes daily.     No current facility-administered medications on file prior to visit.     Allergies  Allergen Reactions  . Flonase [Fluticasone Propionate]     Increases eye pressure   . Sulfa Antibiotics Swelling  . Tussionex Pennkinetic Er [Hydrocod Polst-Cpm Polst Er] Itching    Hyperactive       Review of Systems ROS  General ROS: negative for - chills, fatigue, fever, or weight loss. Positive for hot flashes, night sweats (however does note some improvement on the Paxil), and weight gain  Psychological ROS: negative for - anxiety, decreased libido, depression, mood swings, physical abuse or sexual abuse Ophthalmic ROS: negative for - blurry vision, eye pain or loss of vision ENT ROS: negative for - headaches, hearing change, visual changes or vocal changes Allergy and Immunology ROS: negative for - hives, itchy/watery eyes or seasonal allergies Hematological and Lymphatic ROS: negative for - bleeding problems, bruising, swollen lymph nodes or weight loss Endocrine ROS: negative for - galactorrhea, hair pattern changes, malaise/lethargy, mood swings, palpitations, polydipsia/polyuria, skin changes, temperature intolerance or unexpected weight changes Breast ROS: negative for - new or changing breast lumps or nipple discharge Respiratory ROS: negative for - cough or shortness of breath Cardiovascular ROS: negative for - chest pain, irregular heartbeat, palpitations or shortness of breath Gastrointestinal  ROS: no abdominal pain, change in bowel habits, or black or bloody stools Genito-Urinary ROS: no dysuria, trouble voiding, or hematuria.  Positive for irritation with urination, vaginal dryness, and vaginal itching.  Musculoskeletal ROS: negative for - joint pain or joint stiffness Neurological ROS: negative for - bowel and bladder control changes Dermatological ROS: negative for rash and skin lesion changes   Objective:   BP 108/73   Pulse 74   Ht 5' 2"  (1.575 m)   Wt 231 lb 12.8 oz (105.1 kg)   LMP  (LMP Unknown)   BMI 42.40 kg/m  CONSTITUTIONAL: Well-developed, well-nourished female in no acute distress. Morbid obesity PSYCHIATRIC: Normal mood and affect. Normal behavior. Normal judgment and thought content. [place]: Alert and oriented to person, place, and time. Normal muscle tone coordination. No cranial nerve deficit noted. HENT:  Normocephalic, atraumatic, External right and left ear normal. Oropharynx is clear and moist EYES: Conjunctivae and EOM are normal. Pupils are equal, round, and reactive to light. No scleral icterus.  NECK: Normal range of motion, supple, no masses.  Normal thyroid.  SKIN: Skin is warm and dry. No rash noted. Not diaphoretic. No erythema. No pallor. CARDIOVASCULAR: Normal heart rate noted, regular rhythm, no murmur. RESPIRATORY: Clear to auscultation bilaterally. Effort and breath sounds normal, no problems with respiration noted. BREASTS: Symmetric in size. No masses, skin changes, nipple drainage, or lymphadenopathy. ABDOMEN: Soft, normal bowel sounds, no distention noted.  No tenderness, rebound or guarding.  BLADDER: Normal PELVIC:  Bladder no bladder distension noted  Urethra: normal appearing urethra with no masses, tenderness or lesions  Vulva: normal appearing vulva with no masses, tenderness or lesions  Vagina: normal appearing vagina with no discharge, no lesions.  Mildly atrophic  Cervix: normal appearing cervix without discharge or  lesions  Uterus: uterus is normal size, shape, consistency and nontender  Adnexa: surgically absent bilateral  RV: External Exam NormaI, No Rectal Masses and Normal Sphincter tone  MUSCULOSKELETAL: Normal range of motion. No tenderness.  No cyanosis, clubbing, or edema.  2+ distal  pulses. LYMPHATIC: No Axillary, Supraclavicular, or Inguinal Adenopathy.   Labs: Lab Results  Component Value Date   WBC 3.5 01/17/2019   HGB 15.4 01/17/2019   HCT 43.9 01/17/2019   MCV 85 01/17/2019   PLT 253 01/17/2019    Lab Results  Component Value Date   CREATININE 0.68 01/17/2019   BUN 23 01/17/2019   NA 141 01/17/2019   K 4.8 01/17/2019   CL 103 01/17/2019   CO2 25 01/17/2019    Lab Results  Component Value Date   ALT 24 01/17/2019   AST 25 01/17/2019   GGT 32 05/03/2018   ALKPHOS 130 (H) 01/17/2019   BILITOT 0.4 01/17/2019    Lab Results  Component Value Date   CHOL 281 (H) 12/11/2018   HDL 48 12/11/2018   LDLCALC 195 (H) 12/11/2018   TRIG 188 (H) 12/11/2018   CHOLHDL 6.0 (H) 05/03/2018    Lab Results  Component Value Date   TSH 1.110 05/03/2018    No results found for: HGBA1C     Results for orders placed or performed in visit on 01/17/19  Hepatitis B Titer  Result Value Ref Range   Hep B Surface Ab, Qual Non Reactive   Sedimentation rate  Result Value Ref Range   Sed Rate 45 (H) 0 - 40 mm/hr  C-reactive protein-CRP (Quant.)  Result Value Ref Range   CRP 4 0 - 10 mg/L  CBC with Differential/Platelet  Result Value Ref Range   WBC 3.5 3.4 - 10.8 x10E3/uL   RBC 5.14 3.77 - 5.28 x10E6/uL   Hemoglobin 15.4 11.1 - 15.9 g/dL   Hematocrit 43.9 34.0 - 46.6 %   MCV 85 79 - 97 fL   MCH 30.0 26.6 - 33.0 pg   MCHC 35.1 31.5 - 35.7 g/dL   RDW 12.8 11.7 - 15.4 %   Platelets 253 150 - 450 x10E3/uL   Neutrophils 54 Not Estab. %   Lymphs 33 Not Estab. %   Monocytes 11 Not Estab. %   Eos 1 Not Estab. %   Basos 1 Not Estab. %   Neutrophils Absolute 1.9 1.4 - 7.0  x10E3/uL   Lymphocytes Absolute 1.1 0.7 - 3.1 x10E3/uL   Monocytes Absolute 0.4 0.1 - 0.9 x10E3/uL   EOS (ABSOLUTE) 0.0 0.0 - 0.4 x10E3/uL   Basophils Absolute 0.0 0.0 - 0.2 x10E3/uL   Immature Granulocytes 0 Not Estab. %   Immature Grans (Abs) 0.0 0.0 - 0.1 x10E3/uL  Comp Met (CMET)  Result Value Ref Range   Glucose 97 65 - 99 mg/dL   BUN 23 6 - 24 mg/dL   Creatinine, Ser 0.68 0.57 - 1.00 mg/dL   GFR calc non Af Amer 102 >59 mL/min/1.73   GFR calc Af Amer 118 >59 mL/min/1.73   BUN/Creatinine Ratio 34 (H) 9 - 23   Sodium 141 134 - 144 mmol/L   Potassium 4.8 3.5 - 5.2 mmol/L   Chloride 103 96 - 106 mmol/L   CO2 25 20 - 29 mmol/L   Calcium 10.2 8.7 - 10.2 mg/dL   Total Protein 7.1 6.0 - 8.5 g/dL   Albumin 4.4 3.8 - 4.8 g/dL   Globulin, Total 2.7 1.5 - 4.5 g/dL   Albumin/Globulin Ratio 1.6 1.2 - 2.2   Bilirubin Total 0.4 0.0 - 1.2 mg/dL   Alkaline Phosphatase 130 (H) 39 - 117 IU/L   AST 25 0 - 40 IU/L   ALT 24 0 - 32 IU/L  Anti CCP  Result  Value Ref Range   Cyclic Citrullin Peptide Ab 9 0 - 19 units   Results for orders placed or performed in visit on 02/16/19  POCT urinalysis dipstick  Result Value Ref Range   Color, UA yellow    Clarity, UA clear    Glucose, UA Negative Negative   Bilirubin, UA neg    Ketones, UA neg    Spec Grav, UA 1.025 1.010 - 1.025   Blood, UA neg    pH, UA 6.5 5.0 - 8.0   Protein, UA Negative Negative   Urobilinogen, UA 0.2 0.2 or 1.0 E.U./dL   Nitrite, UA neg    Leukocytes, UA Negative Negative   Appearance yellow    Odor      Assessment:   Encounter for well woman exam with routine gynecological exam  Vasomotor symptoms due to menopause  Vaginal atrophy  Morbid obesity with BMI of 40.0-44.9, adult (HCC)  Rheumatoid arthritis involving multiple sites with positive rheumatoid factor ([place])  Hyperlipidemia  Plan:  - Pap: Not needed. Up to date.  Next due in 2021.  - Mammogram: Ordered - Stool Guaiac Testing:  Ordered.   Colonoscopy ordered by PCP.  - Labs: Labs up to date, reviewed.   - Routine preventative health maintenance measures emphasized: Exercise/Diet/Weight control, Tobacco Warnings, Alcohol/Substance use risks, Stress Management and Peer Pressure Issues - Hyperlipidemia managed by PCP. . - Rheumatoid arthritis managed by Rheumatologist. Patient noting weight gain while on the steroid treatments. Advised to return after her treatment to discuss weight loss options. Encouraged for now to modify to healthy diet and perform low to moderate activity exercises (low weight-bearing as tolerated).  - Vasomotor symptoms partially controlled with Paxil. Patient notes that she can manage them, does not desire to change interventions at this time.  - Vaginal atrophy. Likely cause of urinary irritation and vaginal itching as UA negative today and no discharge seen in vaginal vault. Discussed management options, including local hormonal therapy with estrogen/testosterone vaginally, or can continue use of lubricants and use vaginal moisturizers as needed. Patient ok with local testosterone therapy, given samples of [place] x 1 month. To call at the end of the month if prescription desired. Will need to f/u at 3 month mark of medication use if continued.   Return to [place], MD [facility]

## 2019-02-16 NOTE — Progress Notes (Signed)
PT is present today for her annual exam. Pt stated that she has been doing self-breast exams monthly. Pt stated that she is having pain with urination along with itching and burning in the vaginal area.

## 2019-02-19 LAB — POCT URINALYSIS DIPSTICK
Bilirubin, UA: NEGATIVE
Blood, UA: NEGATIVE
Glucose, UA: NEGATIVE
Ketones, UA: NEGATIVE
Leukocytes, UA: NEGATIVE
Nitrite, UA: NEGATIVE
Protein, UA: NEGATIVE
Spec Grav, UA: 1.025 (ref 1.010–1.025)
Urobilinogen, UA: 0.2 E.U./dL
pH, UA: 6.5 (ref 5.0–8.0)

## 2019-02-23 ENCOUNTER — Encounter: Payer: Self-pay | Admitting: Family

## 2019-02-26 MED ORDER — ESTROGENS, CONJUGATED 0.625 MG/GM VA CREA: 1.0000 | TOPICAL_CREAM | Freq: Every day | VAGINAL | 4 refills | Status: DC

## 2019-02-28 ENCOUNTER — Other Ambulatory Visit: Payer: Self-pay

## 2019-02-28 ENCOUNTER — Telehealth: Payer: Self-pay | Admitting: Family

## 2019-02-28 ENCOUNTER — Other Ambulatory Visit: Payer: Self-pay | Admitting: Family

## 2019-02-28 ENCOUNTER — Encounter: Payer: Self-pay | Admitting: Obstetrics and Gynecology

## 2019-02-28 ENCOUNTER — Telehealth: Payer: Self-pay

## 2019-02-28 ENCOUNTER — Ambulatory Visit (INDEPENDENT_AMBULATORY_CARE_PROVIDER_SITE_OTHER): Payer: Managed Care, Other (non HMO) | Admitting: Obstetrics and Gynecology

## 2019-02-28 DIAGNOSIS — Z6841 Body Mass Index (BMI) 40.0 and over, adult: Secondary | ICD-10-CM

## 2019-02-28 DIAGNOSIS — M0579 Rheumatoid arthritis with rheumatoid factor of multiple sites without organ or systems involvement: Secondary | ICD-10-CM

## 2019-02-28 DIAGNOSIS — I10 Essential (primary) hypertension: Secondary | ICD-10-CM

## 2019-02-28 DIAGNOSIS — R519 Headache, unspecified: Secondary | ICD-10-CM

## 2019-02-28 DIAGNOSIS — R51 Headache: Principal | ICD-10-CM

## 2019-02-28 MED ORDER — PHENTERMINE HCL 37.5 MG PO TABS: 37.5000 mg | ORAL_TABLET | Freq: Every day | ORAL | 1 refills | Status: DC

## 2019-02-28 MED ORDER — METFORMIN HCL ER 750 MG PO TB24: 750.0000 mg | ORAL_TABLET | Freq: Every day | ORAL | 6 refills | Status: DC

## 2019-02-28 MED ORDER — NORTRIPTYLINE HCL 10 MG PO CAPS: 10.0000 mg | ORAL_CAPSULE | Freq: Every day | ORAL | 1 refills | Status: DC

## 2019-02-28 NOTE — Telephone Encounter (Signed)
Pt called to see if she wanted to do a televisit with AC. Pt stated that she would. Pt was put on the schedule with [facility] today.

## 2019-02-28 NOTE — Telephone Encounter (Signed)
Call patient let her know that I refilled her nortriptyline. Last saw her in February And we had planned to a 57-month follow-up.  Please offer her virtual appointment in the next week or two.

## 2019-02-28 NOTE — Telephone Encounter (Signed)
Patient has virtual f/u scheduled 03/07/19.

## 2019-02-28 NOTE — Progress Notes (Signed)
Virtual Visit via Telephone Note  I connected with Yvonne [name] on 02/28/19 at  3:45 PM EDT by telephone and verified that I am speaking with the correct person using two identifiers.   I discussed the limitations, risks, security and privacy concerns of performing an evaluation and management service by telephone and the availability of in person appointments. I also discussed with the patient that there may be a patient responsible charge related to this service. The patient expressed understanding and agreed to proceed.   History of Present Illness: Yvonne [name] is a 53 y.o. G2P2 obese female who desires to begin weight loss management medication. Notes that she has been gaining weight over the past year, and recently was diagnosed with rheumatoid arthritis and has been on steroids which has made her weight gain worse.  She just discontinued the steroids several days ago.    Observations/Objective: No vitals taken for today's virtual visit.  She was seen in the office 2 weeks ago. Vitalsfrom previous visited noted below.    BP 108/73   Pulse 74   Ht 5\' 2"  (1.575 m)   Wt 231 lb 12.8 oz (105.1 kg)   LMP  (LMP Unknown)   BMI 42.40 kg/m   Assessment and Plan: 1. Obesity (BMI 42) 2. HTN 3. Rheumatoid arthritis   Discussed options of weight management with patient, including OTC herbal remedies and weight loss regimens, as well as prescription medications. Patient desires to try prescription. Has comorbidity of HTN (well controlled).  Discussed medication usage, risks/benefits, length of therapy, side effects, and weight management follow up.  Also discussed need for dietary modification and exercise regimen (30 min of physical activity for 3-5 days weekly as tolerated).  Will also begin on Metformin to aid in additional weight loss.  Can refer to nutritionist if needed.   Will need to f/u in 1 month for weight check, and waist circumference measurements.    Follow Up  Instructions:   I discussed the assessment and treatment plan with the patient. The patient was provided an opportunity to ask questions and all were answered. The patient agreed with the plan and demonstrated an understanding of the instructions.   The patient was advised to call back or seek an in-person evaluation if the symptoms worsen or if the condition fails to improve as anticipated.  I provided 11 minutes of non-face-to-face time during this encounter.   Rubie Maid, MD

## 2019-03-05 ENCOUNTER — Other Ambulatory Visit: Payer: Managed Care, Other (non HMO)

## 2019-03-05 ENCOUNTER — Other Ambulatory Visit: Payer: Self-pay

## 2019-03-05 DIAGNOSIS — Z79899 Other long term (current) drug therapy: Secondary | ICD-10-CM

## 2019-03-05 NOTE — Addendum Note (Signed)
Addended by: Judie Petit on: 03/05/2019 09:52 AM   Modules accepted: Orders

## 2019-03-06 LAB — CBC WITH DIFFERENTIAL/PLATELET
Basophils Absolute: 0 10*3/uL (ref 0.0–0.2)
Basos: 1 %
EOS (ABSOLUTE): 0.1 10*3/uL (ref 0.0–0.4)
Eos: 1 %
Hematocrit: 42.2 % (ref 34.0–46.6)
Hemoglobin: 14.5 g/dL (ref 11.1–15.9)
Immature Grans (Abs): 0 10*3/uL (ref 0.0–0.1)
Immature Granulocytes: 0 %
Lymphocytes Absolute: 1.1 10*3/uL (ref 0.7–3.1)
Lymphs: 28 %
MCH: 30.6 pg (ref 26.6–33.0)
MCHC: 34.4 g/dL (ref 31.5–35.7)
MCV: 89 fL (ref 79–97)
Monocytes Absolute: 0.4 10*3/uL (ref 0.1–0.9)
Monocytes: 11 %
Neutrophils Absolute: 2.3 10*3/uL (ref 1.4–7.0)
Neutrophils: 59 %
Platelets: 236 10*3/uL (ref 150–450)
RBC: 4.74 x10E6/uL (ref 3.77–5.28)
RDW: 14.3 % (ref 11.7–15.4)
WBC: 3.8 10*3/uL (ref 3.4–10.8)

## 2019-03-06 LAB — C-REACTIVE PROTEIN: CRP: 5 mg/L (ref 0–10)

## 2019-03-06 LAB — CREATININE, SERUM
Creatinine, Ser: 0.57 mg/dL (ref 0.57–1.00)
GFR calc Af Amer: 125 mL/min/{1.73_m2} (ref >59)
GFR calc non Af Amer: 108 mL/min/{1.73_m2} (ref >59)

## 2019-03-06 LAB — AST: AST: 17 IU/L (ref 0–40)

## 2019-03-06 LAB — SEDIMENTATION RATE: Sed Rate: 47 mm/hr — ABNORMAL HIGH (ref 0–40)

## 2019-03-06 LAB — ALT: ALT: 19 IU/L (ref 0–32)

## 2019-03-07 ENCOUNTER — Ambulatory Visit (INDEPENDENT_AMBULATORY_CARE_PROVIDER_SITE_OTHER): Payer: Managed Care, Other (non HMO) | Admitting: Family

## 2019-03-07 ENCOUNTER — Encounter: Payer: Self-pay | Admitting: Family

## 2019-03-07 DIAGNOSIS — R519 Headache, unspecified: Secondary | ICD-10-CM

## 2019-03-07 DIAGNOSIS — M25562 Pain in left knee: Secondary | ICD-10-CM

## 2019-03-07 DIAGNOSIS — R51 Headache: Secondary | ICD-10-CM

## 2019-03-07 DIAGNOSIS — M25561 Pain in right knee: Secondary | ICD-10-CM

## 2019-03-07 DIAGNOSIS — M0579 Rheumatoid arthritis with rheumatoid factor of multiple sites without organ or systems involvement: Secondary | ICD-10-CM

## 2019-03-07 DIAGNOSIS — G8929 Other chronic pain: Secondary | ICD-10-CM

## 2019-03-07 NOTE — Patient Instructions (Addendum)
Please speak with Dr Meda Coffee in regards to knee pain, possibily injections, as I suspect osteoarthritis is the root cause  Weight loss is very important.   Let me know about knee pain and headaches.   May continue mobic however again, I would like to consider orthopedics.

## 2019-03-07 NOTE — Assessment & Plan Note (Signed)
Chronic, improved on mobic.  Advised again, that I view this medication as a short-term, as needed medication.  We will Continue watch her kidney function on this medication.   encouraged continued weight loss attempts.  Referral placed orthopedics for consideration further intervention including injections.

## 2019-03-07 NOTE — Assessment & Plan Note (Signed)
Unable to take methotrexate at this time, follow Dr. Meda Coffee.  Will follow

## 2019-03-07 NOTE — Assessment & Plan Note (Signed)
Some relapsing symptoms since decreasing amitriptyline.  Patient declines increasing dose at this time.  We jointly agreed referral to orthopedics take a closer look at chronic neck pain, knee pain appropriate.

## 2019-03-07 NOTE — Progress Notes (Signed)
This visit type was conducted due to national recommendations for restrictions regarding the COVID-19 pandemic (e.g. social distancing).  This format is felt to be most appropriate for this patient at this time.  All issues noted in this document were discussed and addressed.  No physical exam was performed (except for noted visual exam findings with Video Visits). Virtual Visit via Video Note  I connected with@ on 03/07/19 at 11:00 AM EDT by a video enabled telemedicine application and verified that I am speaking with the correct person using two identifiers.  Location patient: home Location provider:work  Persons participating in the virtual visit: patient, provider  I discussed the limitations of evaluation and management by telemedicine and the availability of in person appointments. The patient expressed understanding and agreed to proceed.   HPI: Feels well today. No new complaints.   HA- now on nortriptyline 10mg  ( had been on 20 and 50mg ). Thinks on 30mg  HA's were better controlled. on mobic which thinks it helping as "headache is from the neck.'  HA' s are 1-2 x per week.  Uses imitrex 50mg  with relief. HA's feels similar to HA's in the past. No aura, vision changes, numbness to face.  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 No longer on Excedrin. Rarely takes tylenol.    RA-  Wrists continue to bother her. No  Longer on prednisone. Following with Dr Meda Coffee; holding methotrexate injection due to side effects ( nausea); feels better today.   Knee pain-Chronic pain. improved on mobic. Swelling  Improved since not standing as much.    Seen by Dr. Marcelline Mates, on phentermine, metformin.    ROS: See pertinent positives and negatives per HPI.  Past Medical History:  Diagnosis Date  . Allergy   . Arthritis, rheumatoid ([place])   . GERD (gastroesophageal reflux disease)   . Gestational diabetes    patient denies  . Glaucoma   . H/O bronchitis   . Headache    migraine  . History of ovarian cyst    . Hypertension    pre-eclampsia with first child  . Increased pressure in the eye, bilateral     Past Surgical History:  Procedure Laterality Date  . CERVICAL POLYPECTOMY N/A 04/11/2017   Procedure: CERVICAL POLYPECTOMY;  Surgeon: Rubie Maid, MD;  Location: [facility];  Service: Gynecology;  Laterality: N/A;  . HYSTEROSCOPY W/D&C N/A 04/11/2017   Procedure: DILATATION AND CURETTAGE /HYSTEROSCOPY;  Surgeon: Rubie Maid, MD;  Location: [facility];  Service: Gynecology;  Laterality: N/A;  . IUD REMOVAL N/A 05/05/2017   Procedure: INTRAUTERINE DEVICE (IUD) REMOVAL;  Surgeon: Rubie Maid, MD;  Location: [facility];  Service: Gynecology;  Laterality: N/A;  . LAPAROSCOPIC SALPINGO OOPHERECTOMY Bilateral 05/05/2017   Procedure: LAPAROSCOPIC BILATERAL SALPINGO OOPHORECTOMY;  Surgeon: Rubie Maid, MD;  Location: [facility];  Service: Gynecology;  Laterality: Bilateral;  . LAPAROSCOPY N/A 04/11/2017   Procedure: LAPAROSCOPY DIAGNOSTIC;  Surgeon: Rubie Maid, MD;  Location: [facility];  Service: Gynecology;  Laterality: N/A;  . PARTIAL HYSTERECTOMY     Ovaries and Tubes  . TUBAL LIGATION      Family History  Problem Relation Age of Onset  . Schizophrenia Mother   . Bipolar disorder Mother   . Alcohol abuse Father   . Hypertension Father   . Seizures Paternal Grandmother   . Diabetes Paternal Grandmother   . Hypertension Brother   . Hypertension Maternal Grandmother     SOCIAL HX: former smoker   Current Outpatient Medications:  .  amLODipine (NORVASC) 5 MG tablet,  Take 1 tablet (5 mg total) by mouth daily., Disp: 90 tablet, Rfl: 3 .  baclofen (LIORESAL) 10 MG tablet, Take 1 tablet (10 mg total) by mouth daily as needed for muscle spasms., Disp: 30 tablet, Rfl: 0 .  cetirizine (ZYRTEC) 10 MG tablet, Take 10 mg by mouth daily., Disp: , Rfl:  .  conjugated estrogens (PREMARIN) vaginal cream, Place 1 Applicatorful vaginally daily. Apply 0.5 mg daily in the vagina x 2 weeks, then decrease to  twice weekly., Disp: 42.5 g, Rfl: 4 .  famotidine (PEPCID AC) 10 MG tablet, Take 1 tablet (10 mg total) by mouth 2 (two) times daily., Disp: 120 tablet, Rfl: 1 .  latanoprost (XALATAN) 0.005 % ophthalmic solution, Place 1 drop into both eyes at bedtime., Disp: , Rfl:  .  meloxicam (MOBIC) 7.5 MG tablet, Take 1 tablet (7.5 mg total) by mouth daily as needed for pain. With food, Disp: 90 tablet, Rfl: 0 .  metFORMIN (GLUCOPHAGE XR) 750 MG 24 hr tablet, Take 1 tablet (750 mg total) by mouth daily with breakfast., Disp: 30 tablet, Rfl: 6 .  montelukast (SINGULAIR) 10 MG tablet, Take 1 tablet (10 mg total) by mouth at bedtime., Disp: 30 tablet, Rfl: 3 .  nortriptyline (PAMELOR) 10 MG capsule, Take 1 capsule (10 mg total) by mouth at bedtime., Disp: 90 capsule, Rfl: 1 .  PARoxetine (PAXIL) 20 MG tablet, Take 1 tablet (20 mg total) by mouth daily., Disp: 30 tablet, Rfl: 1 .  phentermine (ADIPEX-P) 37.5 MG tablet, Take 1 tablet (37.5 mg total) by mouth daily before breakfast., Disp: 30 tablet, Rfl: 1 .  promethazine (PHENERGAN) 25 MG tablet, Take 1 tablet (25 mg total) by mouth every 8 (eight) hours as needed for nausea or vomiting., Disp: 20 tablet, Rfl: 0 .  propranolol (INDERAL) 40 MG tablet, Take 1 tablet (40 mg total) by mouth 2 (two) times daily., Disp: 60 tablet, Rfl: 0 .  SUMAtriptan (IMITREX) 100 MG tablet, May repeat in 2 hours if headache persists or recurs., Disp: 10 tablet, Rfl: 6 .  timolol (BETIMOL) 0.25 % ophthalmic solution, Place 1 drop into both eyes daily., Disp: , Rfl:  .  Melatonin 10 MG TABS, Take 10 mg by mouth at bedtime., Disp: , Rfl:   EXAM:  VITALS per patient if applicable:  GENERAL: alert, oriented, appears well and in no acute distress  HEENT: atraumatic, conjunttiva clear, no obvious abnormalities on inspection of external nose and ears  NECK: normal movements of the head and neck  LUNGS: on inspection no signs of respiratory distress, breathing rate appears normal, no  obvious gross SOB, gasping or wheezing  CV: no obvious cyanosis  MS: moves all visible extremities without noticeable abnormality  PSYCH/NEURO: pleasant and cooperative, no obvious depression or anxiety, speech and thought processing grossly intact  ASSESSMENT AND PLAN:  Discussed the following assessment and plan:  Chronic pain of both knees - Plan: Ambulatory referral to Orthopedic Surgery  Rheumatoid arthritis involving multiple sites with positive rheumatoid factor (HCC)  Nonintractable headache, unspecified chronicity pattern, unspecified headache type Problem List Items Addressed This Visit      Musculoskeletal and Integument   Rheumatoid arthritis ([place])    Unable to take methotrexate at this time, follow Dr. Meda Coffee.  Will follow        Other   Nonintractable headache    Some relapsing symptoms since decreasing amitriptyline.  Patient declines increasing dose at this time.  We jointly agreed referral to orthopedics take a closer  look at chronic neck pain, knee pain appropriate.       Chronic pain of both knees - Primary    Chronic, improved on mobic.  Advised again, that I view this medication as a short-term, as needed medication.  We will Continue watch her kidney function on this medication.   encouraged continued weight loss attempts.  Referral placed orthopedics for consideration further intervention including injections.      Relevant Orders   Ambulatory referral to Orthopedic Surgery         I discussed the assessment and treatment plan with the patient. The patient was provided an opportunity to ask questions and all were answered. The patient agreed with the plan and demonstrated an understanding of the instructions.   The patient was advised to call back or seek an in-person evaluation if the symptoms worsen or if the condition fails to improve as anticipated.    Mable Paris, FNP

## 2019-03-09 ENCOUNTER — Other Ambulatory Visit: Payer: Self-pay | Admitting: Family

## 2019-03-09 DIAGNOSIS — K219 Gastro-esophageal reflux disease without esophagitis: Secondary | ICD-10-CM

## 2019-03-12 ENCOUNTER — Other Ambulatory Visit: Payer: Self-pay | Admitting: Family

## 2019-03-12 DIAGNOSIS — R51 Headache: Principal | ICD-10-CM

## 2019-03-12 DIAGNOSIS — I10 Essential (primary) hypertension: Secondary | ICD-10-CM

## 2019-03-12 DIAGNOSIS — R519 Headache, unspecified: Secondary | ICD-10-CM

## 2019-03-14 ENCOUNTER — Encounter: Payer: Self-pay | Admitting: Family

## 2019-03-16 ENCOUNTER — Encounter: Payer: Self-pay | Admitting: Family

## 2019-03-16 ENCOUNTER — Other Ambulatory Visit: Payer: Self-pay | Admitting: Family

## 2019-03-16 MED ORDER — PROMETHAZINE HCL 25 MG PO TABS: 25.0000 mg | ORAL_TABLET | Freq: Three times a day (TID) | ORAL | 0 refills | Status: DC | PRN

## 2019-03-21 NOTE — Addendum Note (Signed)
Addended by: Judie Petit on: 03/21/2019 10:30 AM   Modules accepted: Level of Service

## 2019-03-22 ENCOUNTER — Other Ambulatory Visit: Payer: Self-pay

## 2019-03-22 ENCOUNTER — Telehealth: Payer: Self-pay | Admitting: Obstetrics and Gynecology

## 2019-03-22 DIAGNOSIS — Z79899 Other long term (current) drug therapy: Secondary | ICD-10-CM

## 2019-03-22 NOTE — Addendum Note (Signed)
Addended by: Doreen Beam on: 03/22/2019 11:58 AM   Modules accepted: Level of Service

## 2019-03-22 NOTE — Telephone Encounter (Signed)
Pt called to check apt date and time. Thank you.

## 2019-03-27 ENCOUNTER — Telehealth: Payer: Self-pay | Admitting: Family

## 2019-03-27 NOTE — Telephone Encounter (Signed)
Copied from [place] [id number]. Topic: Quick Communication - Rx Refill/Question >> Mar 27, 2019  1:20 PM Nils Flack, Melissa J wrote: Medication:famotidine (PEPCID AC) 10 MG tablet  Has the patient contacted their pharmacy? Yes.   (Agent: If no, request that the patient contact the pharmacy for the refill.) (Agent: If yes, when and what did the pharmacy advise?)  Preferred Pharmacy (with phone number or street name): [facility]  Pt was made aware by pharm that this medication has been recalled, pt needs something else called in please    Agent: Please be advised that RX refills may take up to 3 business days. We ask that you follow-up with your pharmacy.

## 2019-03-28 ENCOUNTER — Other Ambulatory Visit: Payer: Self-pay

## 2019-03-28 DIAGNOSIS — K219 Gastro-esophageal reflux disease without esophagitis: Secondary | ICD-10-CM

## 2019-03-28 MED ORDER — FAMOTIDINE 10 MG PO TABS: 10.0000 mg | ORAL_TABLET | Freq: Two times a day (BID) | ORAL | 1 refills | Status: DC

## 2019-03-28 NOTE — Telephone Encounter (Signed)
Refill has been sent.  °

## 2019-03-29 DIAGNOSIS — G43909 Migraine, unspecified, not intractable, without status migrainosus: Secondary | ICD-10-CM | POA: Insufficient documentation

## 2019-03-30 ENCOUNTER — Telehealth: Payer: Self-pay

## 2019-03-30 ENCOUNTER — Other Ambulatory Visit: Payer: Self-pay

## 2019-03-30 DIAGNOSIS — K219 Gastro-esophageal reflux disease without esophagitis: Secondary | ICD-10-CM

## 2019-03-30 MED ORDER — OMEPRAZOLE 20 MG PO CPDR: 20.0000 mg | DELAYED_RELEASE_CAPSULE | Freq: Every day | ORAL | 3 refills | Status: DC

## 2019-03-30 MED ORDER — FAMOTIDINE 10 MG PO TABS: 10.0000 mg | ORAL_TABLET | Freq: Two times a day (BID) | ORAL | 1 refills | Status: DC

## 2019-03-30 NOTE — Telephone Encounter (Signed)
Call pharmacy Pepcid is not recalled to my knowledge. Is this a certain batch?  Please let me know and inform patient.   I refilled.

## 2019-03-30 NOTE — Telephone Encounter (Signed)
I spoke with patient to let her know that famotidine was not recalled, but that it was out of stock. We have sent in a temporary prescription for omeprezole 20mg  until they get it back in.

## 2019-03-30 NOTE — Telephone Encounter (Signed)
Pt called and stated the RX for Pepcid was sent to pheamacy on 03/28/2019 and the medication was recalled and the pharmacy will not fill and needs a new Rx for something else.  Winslow Verrill,cma

## 2019-04-04 ENCOUNTER — Encounter: Payer: Managed Care, Other (non HMO) | Admitting: Obstetrics and Gynecology

## 2019-04-09 ENCOUNTER — Other Ambulatory Visit: Payer: Managed Care, Other (non HMO)

## 2019-04-10 ENCOUNTER — Other Ambulatory Visit: Payer: Self-pay | Admitting: Family

## 2019-04-10 DIAGNOSIS — M62838 Other muscle spasm: Secondary | ICD-10-CM

## 2019-04-16 ENCOUNTER — Ambulatory Visit (INDEPENDENT_AMBULATORY_CARE_PROVIDER_SITE_OTHER): Payer: Managed Care, Other (non HMO) | Admitting: Family Medicine

## 2019-04-16 ENCOUNTER — Ambulatory Visit: Payer: Self-pay

## 2019-04-16 ENCOUNTER — Ambulatory Visit: Payer: Managed Care, Other (non HMO) | Admitting: Family

## 2019-04-16 ENCOUNTER — Other Ambulatory Visit: Payer: Self-pay

## 2019-04-16 DIAGNOSIS — H938X3 Other specified disorders of ear, bilateral: Secondary | ICD-10-CM | POA: Diagnosis not present

## 2019-04-16 DIAGNOSIS — J309 Allergic rhinitis, unspecified: Secondary | ICD-10-CM

## 2019-04-16 DIAGNOSIS — R42 Dizziness and giddiness: Secondary | ICD-10-CM | POA: Diagnosis not present

## 2019-04-16 MED ORDER — FEXOFENADINE HCL 180 MG PO TABS: 180.0000 mg | ORAL_TABLET | Freq: Every day | ORAL | 1 refills | Status: DC

## 2019-04-16 NOTE — Progress Notes (Signed)
Patient ID: Yvonne [name], female   DOB: [birth year], 53 y.o.   MRN: [id number]    Virtual Visit via video Note  This visit type was conducted due to national recommendations for restrictions regarding the COVID-19 pandemic (e.g. social distancing).  This format is felt to be most appropriate for this patient at this time.  All issues noted in this document were discussed and addressed.  No physical exam was performed (except for noted visual exam findings with Video Visits).   I connected with Phil Dopp today at  3:00 PM EDT by a video enabled telemedicine application and verified that I am speaking with the correct person using two identifiers. Location patient: home Location provider: [place] participating in the virtual visit: patient, provider  I discussed the limitations, risks, security and privacy concerns of performing an evaluation and management service by video and the availability of in person appointments. I also discussed with the patient that there may be a patient responsible charge related to this service. The patient expressed understanding and agreed to proceed.   HPI:  Patient and I connected via video due to complaint of dizziness and a "swimmy headed feeling" especially when she moves her head from side to side.  Patient has dealt with vertigo off and on for years.  States that she currently takes Zyrtec in the morning, and Singulair at night and sometimes will even take a Benadryl at night as well.  Patient has used Flonase nasal spray in the past, did have good success with this medication however it made her eye pressure go too high and she was advised to stop Flonase nasal spray by her ophthalmologist.  Also complains of fullness feeling in ears and some ear pain at times.  Denies sinus congestion or thick nasal drainage.  Denies cough, shortness of breath or wheezing.  Denies fever or chills.  Denies chest pain.  Denies any fainting.  Denies GI  or GU issues.  Denies body aches.   ROS:  Constitutional: Negative for chills, fatigue and fever.  HENT: Negative for congestion, sinus pain and sore throat. +ear pain/ear fullness   Eyes: Negative.   Respiratory: Negative for cough, shortness of breath and wheezing.   Cardiovascular: Negative for chest pain, palpitations and leg swelling.  Gastrointestinal: Negative for abdominal pain, diarrhea, nausea and vomiting.  Genitourinary: Negative for dysuria, frequency and urgency.  Musculoskeletal: Negative for arthralgias and myalgias.  Skin: Negative for color change, pallor and rash.  Neurological: Negative for syncope, and headaches. +dizziness, "swimmy head feeling".  Psychiatric/Behavioral: The patient is not nervous/anxious.    Past Medical History:  Diagnosis Date  . Allergy   . Arthritis, rheumatoid ([place])   . GERD (gastroesophageal reflux disease)   . Gestational diabetes    patient denies  . Glaucoma   . H/O bronchitis   . Headache    migraine  . History of ovarian cyst   . Hypertension    pre-eclampsia with first child  . Increased pressure in the eye, bilateral     Past Surgical History:  Procedure Laterality Date  . CERVICAL POLYPECTOMY N/A 04/11/2017   Procedure: CERVICAL POLYPECTOMY;  Surgeon: Rubie Maid, MD;  Location: [facility];  Service: Gynecology;  Laterality: N/A;  . HYSTEROSCOPY W/D&C N/A 04/11/2017   Procedure: DILATATION AND CURETTAGE /HYSTEROSCOPY;  Surgeon: Rubie Maid, MD;  Location: [facility];  Service: Gynecology;  Laterality: N/A;  . IUD REMOVAL N/A 05/05/2017   Procedure: INTRAUTERINE DEVICE (IUD) REMOVAL;  Surgeon: Marcelline Mates,  Dolphus Jenny, MD;  Location: [facility];  Service: Gynecology;  Laterality: N/A;  . LAPAROSCOPIC SALPINGO OOPHERECTOMY Bilateral 05/05/2017   Procedure: LAPAROSCOPIC BILATERAL SALPINGO OOPHORECTOMY;  Surgeon: Rubie Maid, MD;  Location: [facility];  Service: Gynecology;  Laterality: Bilateral;  . LAPAROSCOPY N/A 04/11/2017   Procedure:  LAPAROSCOPY DIAGNOSTIC;  Surgeon: Rubie Maid, MD;  Location: [facility];  Service: Gynecology;  Laterality: N/A;  . PARTIAL HYSTERECTOMY     Ovaries and Tubes  . TUBAL LIGATION      Family History  Problem Relation Age of Onset  . Schizophrenia Mother   . Bipolar disorder Mother   . Alcohol abuse Father   . Hypertension Father   . Seizures Paternal Grandmother   . Diabetes Paternal Grandmother   . Hypertension Brother   . Hypertension Maternal Grandmother    Social History   Tobacco Use  . Smoking status: Former Smoker    Packs/day: 1.00    Years: 6.00    Pack years: 6.00    Types: Cigarettes    Last attempt to quit: 05/29/2000    Years since quitting: 18.8  . Smokeless tobacco: Never Used  . Tobacco comment: 1/2-1 PPD  Substance Use Topics  . Alcohol use: No    Alcohol/week: 0.0 standard drinks    Current Outpatient Medications:  .  acetaminophen (TYLENOL) 325 MG tablet, Take by mouth., Disp: , Rfl:  .  albuterol (VENTOLIN HFA) 108 (90 Base) MCG/ACT inhaler, , Disp: , Rfl:  .  amLODipine (NORVASC) 5 MG tablet, Take 1 tablet (5 mg total) by mouth daily., Disp: 90 tablet, Rfl: 3 .  baclofen (LIORESAL) 10 MG tablet, Take 1 tablet (10 mg total) by mouth daily as needed for muscle spasms., Disp: 30 tablet, Rfl: 0 .  cetirizine (ZYRTEC) 10 MG tablet, Take 10 mg by mouth daily., Disp: , Rfl:  .  conjugated estrogens (PREMARIN) vaginal cream, Place 1 Applicatorful vaginally daily. Apply 0.5 mg daily in the vagina x 2 weeks, then decrease to twice weekly., Disp: 42.5 g, Rfl: 4 .  diclofenac sodium (VOLTAREN) 1 % GEL, , Disp: , Rfl:  .  doxycycline (PERIOSTAT) 20 MG tablet, , Disp: , Rfl:  .  famotidine (PEPCID AC) 10 MG tablet, Take 1 tablet (10 mg total) by mouth 2 (two) times daily., Disp: 120 tablet, Rfl: 1 .  folic acid (FOLVITE) 1 MG tablet, , Disp: , Rfl:  .  Insulin Syringe-Needle U-100 29G X 1/2" 0.5 ML MISC, Use as directed For methotrexate injection, Disp: , Rfl:  .   latanoprost (XALATAN) 0.005 % ophthalmic solution, Place 1 drop into both eyes at bedtime., Disp: , Rfl:  .  Melatonin 10 MG TABS, Take 10 mg by mouth at bedtime., Disp: , Rfl:  .  meloxicam (MOBIC) 7.5 MG tablet, Take 1 tablet (7.5 mg total) by mouth daily as needed for pain. With food, Disp: 90 tablet, Rfl: 0 .  metFORMIN (GLUCOPHAGE XR) 750 MG 24 hr tablet, Take 1 tablet (750 mg total) by mouth daily with breakfast., Disp: 30 tablet, Rfl: 6 .  methotrexate 50 MG/2ML injection, Take 0.8 ml once a week, 12 weeks, Disp: , Rfl:  .  montelukast (SINGULAIR) 10 MG tablet, Take 1 tablet (10 mg total) by mouth at bedtime., Disp: 30 tablet, Rfl: 3 .  nortriptyline (PAMELOR) 10 MG capsule, Take 1 capsule (10 mg total) by mouth at bedtime., Disp: 90 capsule, Rfl: 1 .  omeprazole (PRILOSEC) 20 MG capsule, Take 1 capsule (20 mg total)  by mouth daily., Disp: 30 capsule, Rfl: 3 .  PARoxetine (PAXIL) 20 MG tablet, Take 1 tablet (20 mg total) by mouth daily., Disp: 30 tablet, Rfl: 1 .  phentermine (ADIPEX-P) 37.5 MG tablet, Take 1 tablet (37.5 mg total) by mouth daily before breakfast., Disp: 30 tablet, Rfl: 1 .  promethazine (PHENERGAN) 25 MG tablet, Take 1 tablet (25 mg total) by mouth every 8 (eight) hours as needed for nausea or vomiting., Disp: 20 tablet, Rfl: 0 .  propranolol (INDERAL) 40 MG tablet, Take 1 tablet (40 mg total) by mouth 2 (two) times daily., Disp: 60 tablet, Rfl: 0 .  SUMAtriptan (IMITREX) 100 MG tablet, May repeat in 2 hours if headache persists or recurs., Disp: 10 tablet, Rfl: 6 .  timolol (BETIMOL) 0.25 % ophthalmic solution, Place 1 drop into both eyes daily., Disp: , Rfl:   EXAM:  GENERAL: alert, oriented, appears well and in no acute distress  HEENT: atraumatic, conjunttiva clear, no obvious abnormalities on inspection of external nose and ears  NECK: normal movements of the head and neck  LUNGS: on inspection no signs of respiratory distress, breathing rate appears normal, no  obvious gross SOB, gasping or wheezing  CV: no obvious cyanosis  MS: moves all visible extremities without noticeable abnormality  PSYCH/NEURO: pleasant and cooperative, no obvious depression or anxiety, speech and thought processing grossly intact.  Smile symmetrical.  Eye contact normal.  EOM normal.  Speech clear.  Can raise eyebrows, puff out cheeks and clench teeth without issues.  ASSESSMENT AND PLAN:  Discussed the following assessment and plan:  Vertigo - Plan: fexofenadine (ALLEGRA ALLERGY) 180 MG tablet  Ear fullness, bilateral  Chronic allergic rhinitis  Patient symptoms do sound a lot like vertigo.  She appears to be in no acute distress over video chat and is neurologically intact.  We will trial switching her Zyrtec to Allegra daily and see if this change in antihistamine does help better control her ear fullness symptoms and vertigo feeling.  She will continue Singulair at night and use Benadryl if needed.  Advised to be sure to keep up good fluid intake and try to change positions more slowly.  Advised that if this change in antihistamine from Zyrtec to [place] does not seem to help much, next up in our plan could be referral to ENT for further evaluation.   I discussed the assessment and treatment plan with the patient. The patient was provided an opportunity to ask questions and all were answered. The patient agreed with the plan and demonstrated an understanding of the instructions.   The patient was advised to call back or seek an in-person evaluation if the symptoms worsen or if the condition fails to improve as anticipated.   Jodelle Green, FNP

## 2019-04-16 NOTE — Telephone Encounter (Signed)
Phone call from pt.  Reported onset of dizziness last Thursday.  Described as a "swimmy-headed feeling".  Stated her sx's are moderate.  Reported feeling popping and ringing in bilateral ears; right > left.   Reported feeling congestion in ears.  Stated it alternates from one ear to the other. Reported she has had small amt. of "brownish-colored" ear drainage.  Denied fever.  Denied nasal congestion.  Stated "I have always had a lot of problems with my ears, and have a lot of ear wax that builds up."  Stated she does have seasonal allergies that are managed by Singulair.   Attempted to call the office 3 times; unable to get a Scheduler at 11:37 AM.  Gave pt. Care advice per protocol.   Advised will send message to the office, to have a Scheduler call her back, to set up a Virtual Visit.  Pt. Verb. Understanding.  Agreed with plan.       Reason for Disposition . [1] MODERATE dizziness (e.g., vertigo; feels very unsteady, interferes with normal activities) AND [2] has NOT been evaluated by physician for this  Answer Assessment - Initial Assessment Questions 1. DESCRIPTION: "Describe your dizziness."    It's like a dizzy, swimmy- headed feeling.  2. VERTIGO: "Do you feel like either you or the room is spinning or tilting?"      Yes, feels the room spinning sometimes 3. LIGHTHEADED: "Do you feel lightheaded?" (e.g., somewhat faint, woozy, weak upon standing)     Denied feeling faint 4. SEVERITY: "How bad is it?"  "Can you walk?"   - MILD - Feels unsteady but walking normally.   - MODERATE - Feels very unsteady when walking, but not falling; interferes with normal activities (e.g., school, work) .   - SEVERE - Unable to walk without falling (requires assistance).     moderate 5. ONSET:  "When did the dizziness begin?"     It started about Thursday  6. AGGRAVATING FACTORS: "Does anything make it worse?" (e.g., standing, change in head position)     Moving quickly, or turning head a certain way 7.  CAUSE: "What do you think is causing the dizziness?"     Thinks it could be inner ear problem  8. RECURRENT SYMPTOM: "Have you had dizziness before?" If so, ask: "When was the last time?" "What happened that time?"     Yes, when she had an ear infection 9. OTHER SYMPTOMS: "Do you have any other symptoms?" (e.g., headache, weakness, numbness, vomiting, earache)     Feeling like a popping and ringing in her ears and feels congestion; right >left; denied fever; reported small amt. drainage from ears-like a brownish color. "I have always had problems with my ears; I get a lot of ear wax in my ears".  C/o intermittent nausea; C/o seasonal allergies ; has had sneezing recently with increased pollen.  Feels that Singulair has managed her seasonal allergies.   10. PREGNANCY: "Is there any chance you are pregnant?" "When was your last menstrual period?"       Hx of Partial Hysterectomy in 2018; in full menopause  Protocols used: DIZZINESS - VERTIGO-A-AH

## 2019-04-16 NOTE — Telephone Encounter (Signed)
Scheduled with lauren at 3 pm today.  Nina,cma

## 2019-04-17 ENCOUNTER — Encounter: Payer: Self-pay | Admitting: Family Medicine

## 2019-04-24 ENCOUNTER — Other Ambulatory Visit: Payer: Self-pay | Admitting: Obstetrics and Gynecology

## 2019-04-24 NOTE — Telephone Encounter (Signed)
Patient needs to come in for a weight/BP check.

## 2019-04-27 ENCOUNTER — Other Ambulatory Visit: Payer: Self-pay | Admitting: Family

## 2019-04-27 DIAGNOSIS — J3089 Other allergic rhinitis: Secondary | ICD-10-CM

## 2019-04-30 NOTE — Telephone Encounter (Signed)
Pt has an appointment to see you on 05/03/19.

## 2019-05-02 ENCOUNTER — Telehealth: Payer: Self-pay

## 2019-05-02 NOTE — Telephone Encounter (Signed)
Pt prescreened no symptoms, pt has face mask. Best contact number cell phone.   Coronavirus (COVID-19) Are you at risk?  Are you at risk for the Coronavirus (COVID-19)?  To be considered HIGH RISK for Coronavirus (COVID-19), you have to meet the following criteria:  . Traveled to [place], [place], [place], [place] or [place]; or in the [place] to [place], [place], [place], or [place]; and have fever, cough, and shortness of breath within the last 2 weeks of travel OR . Been in close contact with a person diagnosed with COVID-19 within the last 2 weeks and have fever, cough, and shortness of breath . IF YOU DO NOT MEET THESE CRITERIA, YOU ARE CONSIDERED LOW RISK FOR COVID-19.  What to do if you are HIGH RISK for COVID-19?  Marland Kitchen If you are having a medical emergency, call 911. . Seek medical care right away. Before you go to a doctor's office, urgent care or emergency department, call ahead and tell them about your recent travel, contact with someone diagnosed with COVID-19, and your symptoms. You should receive instructions from your physician's office regarding next steps of care.  . When you arrive at healthcare provider, tell the healthcare staff immediately you have returned from visiting [place], [place], [place], [place] or [place]; or traveled in the [place] to [place], [place], [place], or [place]; in the last two weeks or you have been in close contact with a person diagnosed with COVID-19 in the last 2 weeks.   . Tell the health care staff about your symptoms: fever, cough and shortness of breath. . After you have been seen by a medical provider, you will be either: o Tested for (COVID-19) and discharged home on quarantine except to seek medical care if symptoms worsen, and asked to  - Stay home and avoid contact with others until you get your results (4-5 days)  - Avoid travel on public transportation if possible (such as bus, train, or airplane) or o Sent to the  Emergency Department by EMS for evaluation, COVID-19 testing, and possible admission depending on your condition and test results.  What to do if you are LOW RISK for COVID-19?  Reduce your risk of any infection by using the same precautions used for avoiding the common cold or flu:  Marland Kitchen Wash your hands often with soap and warm water for at least 20 seconds.  If soap and water are not readily available, use an alcohol-based hand sanitizer with at least 60% alcohol.  . If coughing or sneezing, cover your mouth and nose by coughing or sneezing into the elbow areas of your shirt or coat, into a tissue or into your sleeve (not your hands). . Avoid shaking hands with others and consider head nods or verbal greetings only. . Avoid touching your eyes, nose, or mouth with unwashed hands.  . Avoid close contact with people who are sick. . Avoid places or events with large numbers of people in one location, like concerts or sporting events. . Carefully consider travel plans you have or are making. . If you are planning any travel outside or inside the [place], visit the CDC's Travelers' Health webpage for the latest health notices. . If you have some symptoms but not all symptoms, continue to monitor at home and seek medical attention if your symptoms worsen. . If you are having a medical emergency, call 911.   ADDITIONAL HEALTHCARE OPTIONS FOR PATIENTS  [facility] / e-Visit: [email or web address]  [facility]: [phone]  [facility]: [phone]                   [facility]: [phone]  

## 2019-05-03 ENCOUNTER — Ambulatory Visit: Payer: Managed Care, Other (non HMO) | Admitting: Obstetrics and Gynecology

## 2019-05-03 ENCOUNTER — Encounter: Payer: Self-pay | Admitting: Obstetrics and Gynecology

## 2019-05-03 ENCOUNTER — Encounter: Payer: Managed Care, Other (non HMO) | Admitting: Obstetrics and Gynecology

## 2019-05-03 ENCOUNTER — Other Ambulatory Visit: Payer: Self-pay

## 2019-05-03 VITALS — BP 122/78 | HR 80 | Ht 62.0 in | Wt 235.3 lb

## 2019-05-03 DIAGNOSIS — Z7689 Persons encountering health services in other specified circumstances: Secondary | ICD-10-CM | POA: Diagnosis not present

## 2019-05-03 DIAGNOSIS — Z6841 Body Mass Index (BMI) 40.0 and over, adult: Secondary | ICD-10-CM | POA: Diagnosis not present

## 2019-05-03 NOTE — Progress Notes (Signed)
Pt is present today for wt/bp check. Pt stated that for some reason she is not losing weight.

## 2019-05-03 NOTE — Progress Notes (Signed)
    GYNECOLOGY PROGRESS NOTE  Subjective:    Patient ID: Yvonne [name], female    DOB: 1966-01-24, 53 y.o.   MRN: [id number]  HPI  Patient is a 53 y.o. G2P2 female who presents for weight loss management.  She has been on Adipex x 2 months. She notes that she has actually gained weight since her last visit.  On further discussion patient does admit that she has not been as complaint with her diet nor has she been engaging in regular exercise. Also cites that she has had a recent diagnosis of osteoarthritis in her knees, and has been limited on physical activity at the moment. Reports use of a medication for her RA that also has her eating a little more.    The following portions of the patient's history were reviewed and updated as appropriate: allergies, current medications, past family history, past medical history, past social history, past surgical history and problem list.  Review of Systems Pertinent items noted in HPI and remainder of comprehensive ROS otherwise negative.   Objective:   Blood pressure 122/78, pulse 80, height 5\' 2"  (1.575 m), weight 235 lb 4.8 oz (106.7 kg).  Wt Readings from Last 3 Encounters:  05/03/19 235 lb 4.8 oz (106.7 kg)  02/16/19 231 lb 12.8 oz (105.1 kg)  01/19/19 227 lb 9.6 oz (103.2 kg)     General appearance: alert and no distress. Remainder of exam deferred.     Assessment:   Morbid obesity with BMI of 40.0-44.9, adult ([place])  Encounter for weight management  Plan:   1. Patient not very compliant with exercise or dietary modifications during this time due to health reasons as well as noting being at home more during [place] which has not helped with her dietary limitations.  Discussed that weight loss was a lifestyle change, and that Adipex alone would not give her the results she desired.  Patient desires to hold off on medication for now, citing cost as well as lack of motivation and will power at this time.  She states that once she begins  going back to work, and starts an exercise regimen for her osteoarthritis, she may desire to return to the medication at a later date.  Discussed some OTC options for patient to take that may be more cost prohibitive.   Return to clinic for any scheduled appointments or for any gynecologic concerns as needed.    Rubie Maid, MD [facility]

## 2019-05-04 ENCOUNTER — Encounter: Payer: Self-pay | Admitting: Family

## 2019-05-07 ENCOUNTER — Other Ambulatory Visit: Payer: Self-pay | Admitting: Family

## 2019-05-07 ENCOUNTER — Encounter: Payer: Self-pay | Admitting: Family

## 2019-05-07 DIAGNOSIS — I10 Essential (primary) hypertension: Secondary | ICD-10-CM

## 2019-05-07 DIAGNOSIS — R519 Headache, unspecified: Secondary | ICD-10-CM

## 2019-05-08 ENCOUNTER — Other Ambulatory Visit: Payer: Self-pay | Admitting: Family

## 2019-05-08 DIAGNOSIS — M62838 Other muscle spasm: Secondary | ICD-10-CM

## 2019-05-09 ENCOUNTER — Encounter: Payer: Self-pay | Admitting: Family

## 2019-05-09 ENCOUNTER — Other Ambulatory Visit: Payer: Self-pay | Admitting: Family

## 2019-05-09 MED ORDER — DOXYCYCLINE HYCLATE 20 MG PO TABS: 20.0000 mg | ORAL_TABLET | Freq: Every day | ORAL | 0 refills | Status: DC

## 2019-05-10 ENCOUNTER — Encounter: Payer: Managed Care, Other (non HMO) | Admitting: Obstetrics and Gynecology

## 2019-07-02 ENCOUNTER — Other Ambulatory Visit: Payer: Self-pay | Admitting: Family

## 2019-07-02 DIAGNOSIS — J3089 Other allergic rhinitis: Secondary | ICD-10-CM

## 2019-07-02 DIAGNOSIS — M62838 Other muscle spasm: Secondary | ICD-10-CM

## 2019-07-16 ENCOUNTER — Other Ambulatory Visit: Payer: Self-pay | Admitting: Family

## 2019-07-16 DIAGNOSIS — J3089 Other allergic rhinitis: Secondary | ICD-10-CM

## 2019-07-16 NOTE — Telephone Encounter (Signed)
Pt had a refill sent on 07/04/19 to her pharmacy of choice. Called pharmacy and they state they have rx pt just has been picked it up. Attempted to reach pt, no answer. Left detailed vm with information to check with her pharmacy

## 2019-07-16 NOTE — Telephone Encounter (Signed)
Medication Refill - Medication: montelukast (SINGULAIR) 10 MG tablet  Pt states she did not realize medication had no refills and will be going out of town on Wednesday.  She is requesting refill asap.  Preferred Pharmacy: [place], [place] - [phone] (Phone) [phone] (Fax)    Pt was advised that RX refills may take up to 3 business days. We ask that you follow-up with your pharmacy.

## 2019-07-30 ENCOUNTER — Other Ambulatory Visit: Payer: Self-pay | Admitting: Family

## 2019-07-30 DIAGNOSIS — I10 Essential (primary) hypertension: Secondary | ICD-10-CM

## 2019-07-30 DIAGNOSIS — R519 Headache, unspecified: Secondary | ICD-10-CM

## 2019-08-09 ENCOUNTER — Other Ambulatory Visit: Payer: Self-pay | Admitting: Family

## 2019-08-15 ENCOUNTER — Other Ambulatory Visit: Payer: Self-pay

## 2019-08-15 ENCOUNTER — Other Ambulatory Visit: Payer: Self-pay | Admitting: Family

## 2019-08-15 MED ORDER — DOXYCYCLINE HYCLATE 20 MG PO TABS: 20.0000 mg | ORAL_TABLET | Freq: Every day | ORAL | 0 refills | Status: DC

## 2019-08-27 ENCOUNTER — Other Ambulatory Visit: Payer: Self-pay | Admitting: Family

## 2019-08-27 DIAGNOSIS — R519 Headache, unspecified: Secondary | ICD-10-CM

## 2019-09-10 ENCOUNTER — Encounter: Payer: Self-pay | Admitting: Family Medicine

## 2019-09-10 ENCOUNTER — Other Ambulatory Visit: Payer: Self-pay | Admitting: Family

## 2019-09-10 DIAGNOSIS — G8929 Other chronic pain: Secondary | ICD-10-CM

## 2019-09-10 DIAGNOSIS — M25562 Pain in left knee: Secondary | ICD-10-CM

## 2019-09-10 DIAGNOSIS — J3089 Other allergic rhinitis: Secondary | ICD-10-CM

## 2019-09-11 ENCOUNTER — Other Ambulatory Visit: Payer: Self-pay | Admitting: Lab

## 2019-09-11 ENCOUNTER — Other Ambulatory Visit: Payer: Self-pay | Admitting: Family

## 2019-09-11 DIAGNOSIS — J3089 Other allergic rhinitis: Secondary | ICD-10-CM

## 2019-09-11 DIAGNOSIS — R42 Dizziness and giddiness: Secondary | ICD-10-CM

## 2019-09-11 MED ORDER — FEXOFENADINE HCL 180 MG PO TABS: 180.0000 mg | ORAL_TABLET | Freq: Every day | ORAL | 1 refills | Status: DC

## 2019-09-11 MED ORDER — MONTELUKAST SODIUM 10 MG PO TABS: 10.0000 mg | ORAL_TABLET | Freq: Every day | ORAL | 0 refills | Status: DC

## 2019-09-12 MED ORDER — MELOXICAM 7.5 MG PO TABS: 7.5000 mg | ORAL_TABLET | Freq: Every day | ORAL | 0 refills | Status: DC | PRN

## 2019-09-12 MED ORDER — DOXYCYCLINE HYCLATE 20 MG PO TABS: 20.0000 mg | ORAL_TABLET | Freq: Every day | ORAL | 0 refills | Status: DC

## 2019-09-20 ENCOUNTER — Other Ambulatory Visit: Payer: Self-pay | Admitting: Family

## 2019-09-20 DIAGNOSIS — R519 Headache, unspecified: Secondary | ICD-10-CM

## 2019-09-20 DIAGNOSIS — I10 Essential (primary) hypertension: Secondary | ICD-10-CM

## 2019-09-20 MED ORDER — NORTRIPTYLINE HCL 10 MG PO CAPS: 10.0000 mg | ORAL_CAPSULE | Freq: Every day | ORAL | 0 refills | Status: DC

## 2019-09-20 MED ORDER — PROPRANOLOL HCL 40 MG PO TABS: 40.0000 mg | ORAL_TABLET | Freq: Two times a day (BID) | ORAL | 0 refills | Status: DC

## 2019-09-20 MED ORDER — OMEPRAZOLE 20 MG PO CPDR: 20.0000 mg | DELAYED_RELEASE_CAPSULE | Freq: Every day | ORAL | 0 refills | Status: DC

## 2019-09-20 NOTE — Telephone Encounter (Signed)
I refilled her nortriptyline x 1.  She needs to schedule a f/u appt.

## 2019-09-20 NOTE — Telephone Encounter (Signed)
Pt stated they are going out of state tomorrow and she needs the refill requests to be approved so she can pick them up.

## 2019-09-21 ENCOUNTER — Encounter: Payer: Self-pay | Admitting: Family

## 2019-09-21 NOTE — Telephone Encounter (Signed)
LM asking patient to call back to make f/u appointment so she could continue to get her meds refilled.

## 2019-09-28 ENCOUNTER — Other Ambulatory Visit: Payer: Self-pay | Admitting: Family

## 2019-09-28 DIAGNOSIS — R519 Headache, unspecified: Secondary | ICD-10-CM

## 2019-10-01 ENCOUNTER — Ambulatory Visit: Payer: Managed Care, Other (non HMO) | Admitting: Internal Medicine

## 2019-10-09 ENCOUNTER — Other Ambulatory Visit: Payer: Self-pay

## 2019-10-09 ENCOUNTER — Encounter: Payer: Self-pay | Admitting: Family

## 2019-10-15 ENCOUNTER — Other Ambulatory Visit: Payer: Self-pay | Admitting: Family

## 2019-10-15 DIAGNOSIS — R519 Headache, unspecified: Secondary | ICD-10-CM

## 2019-10-17 ENCOUNTER — Encounter: Payer: Self-pay | Admitting: Family

## 2019-10-17 ENCOUNTER — Other Ambulatory Visit: Payer: Self-pay | Admitting: Family

## 2019-10-17 ENCOUNTER — Other Ambulatory Visit: Payer: Self-pay | Admitting: Internal Medicine

## 2019-10-17 DIAGNOSIS — J3089 Other allergic rhinitis: Secondary | ICD-10-CM

## 2019-10-17 DIAGNOSIS — R519 Headache, unspecified: Secondary | ICD-10-CM

## 2019-10-17 MED ORDER — MONTELUKAST SODIUM 10 MG PO TABS: 10.0000 mg | ORAL_TABLET | Freq: Every day | ORAL | 0 refills | Status: DC

## 2019-10-20 ENCOUNTER — Other Ambulatory Visit: Payer: Self-pay

## 2019-10-20 DIAGNOSIS — Z20822 Contact with and (suspected) exposure to covid-19: Secondary | ICD-10-CM

## 2019-10-22 LAB — NOVEL CORONAVIRUS, NAA: SARS-CoV-2, NAA: NOT DETECTED

## 2019-10-23 ENCOUNTER — Ambulatory Visit: Payer: Managed Care, Other (non HMO) | Admitting: Internal Medicine

## 2019-10-24 ENCOUNTER — Other Ambulatory Visit: Payer: Self-pay

## 2019-10-24 ENCOUNTER — Ambulatory Visit (INDEPENDENT_AMBULATORY_CARE_PROVIDER_SITE_OTHER): Payer: Managed Care, Other (non HMO) | Admitting: Internal Medicine

## 2019-10-24 DIAGNOSIS — J3089 Other allergic rhinitis: Secondary | ICD-10-CM | POA: Diagnosis not present

## 2019-10-24 DIAGNOSIS — Z713 Dietary counseling and surveillance: Secondary | ICD-10-CM

## 2019-10-24 DIAGNOSIS — G43809 Other migraine, not intractable, without status migrainosus: Secondary | ICD-10-CM | POA: Diagnosis not present

## 2019-10-24 DIAGNOSIS — I10 Essential (primary) hypertension: Secondary | ICD-10-CM | POA: Diagnosis not present

## 2019-10-24 DIAGNOSIS — Z789 Other specified health status: Secondary | ICD-10-CM

## 2019-10-24 DIAGNOSIS — R519 Headache, unspecified: Secondary | ICD-10-CM | POA: Diagnosis not present

## 2019-10-24 DIAGNOSIS — R739 Hyperglycemia, unspecified: Secondary | ICD-10-CM

## 2019-10-24 DIAGNOSIS — L659 Nonscarring hair loss, unspecified: Secondary | ICD-10-CM

## 2019-10-24 MED ORDER — PROPRANOLOL HCL 40 MG PO TABS: 40.0000 mg | ORAL_TABLET | Freq: Two times a day (BID) | ORAL | 1 refills | Status: DC

## 2019-10-24 MED ORDER — OMEPRAZOLE 20 MG PO CPDR: 20.0000 mg | DELAYED_RELEASE_CAPSULE | Freq: Every day | ORAL | 1 refills | Status: DC

## 2019-10-24 MED ORDER — MONTELUKAST SODIUM 10 MG PO TABS: 10.0000 mg | ORAL_TABLET | Freq: Every day | ORAL | 1 refills | Status: DC

## 2019-10-24 NOTE — Progress Notes (Signed)
Patient ID: Yvonne [name], female   DOB: November 13, 1966, 53 y.o.   MRN: [id number]   Virtual Visit via video Note  This visit type was conducted due to national recommendations for restrictions regarding the COVID-19 pandemic (e.g. social distancing).  This format is felt to be most appropriate for this patient at this time.  All issues noted in this document were discussed and addressed.  No physical exam was performed (except for noted visual exam findings with Video Visits).   I connected with Phil Dopp by a video enabled telemedicine application and verified that I am speaking with the correct person using two identifiers. Location patient: home Location provider: work Persons participating in the virtual visit: patient, provider  I discussed the limitations, risks, security and privacy concerns of performing an evaluation and management service by video and the availability of in person appointments. The patient expressed understanding and agreed to proceed.   Reason for visit: work in appt.    HPI: She reports she is doing relatively well.  On propranolol.  Headaches are better.  Concerned regarding her weight.  Desires weight loss.  Was seeing Dr Marcelline Mates.  Had prescribed adipex.  No longer taking.  Discussed low carb diet.  Will send information regarding [facility] diet and Dr Derrel Nip diet.  Discussed exercise.  No chest pain.  No sob.  No acid reflux. No abdominal pain.  Bowels moving.  She is concerned regarding her hair thinning.  Discussed obtaining labs to confirm no underlying metabolic etiology.     ROS: See pertinent positives and negatives per HPI.  Past Medical History:  Diagnosis Date  . Allergy   . Arthritis, rheumatoid ([place])   . GERD (gastroesophageal reflux disease)   . Gestational diabetes    patient denies  . Glaucoma   . H/O bronchitis   . Headache    migraine  . History of ovarian cyst   . Hypertension    pre-eclampsia with first child  . Increased pressure  in the eye, bilateral     Past Surgical History:  Procedure Laterality Date  . CERVICAL POLYPECTOMY N/A 04/11/2017   Procedure: CERVICAL POLYPECTOMY;  Surgeon: Rubie Maid, MD;  Location: [facility];  Service: Gynecology;  Laterality: N/A;  . HYSTEROSCOPY W/D&C N/A 04/11/2017   Procedure: DILATATION AND CURETTAGE /HYSTEROSCOPY;  Surgeon: Rubie Maid, MD;  Location: [facility];  Service: Gynecology;  Laterality: N/A;  . IUD REMOVAL N/A 05/05/2017   Procedure: INTRAUTERINE DEVICE (IUD) REMOVAL;  Surgeon: Rubie Maid, MD;  Location: [facility];  Service: Gynecology;  Laterality: N/A;  . LAPAROSCOPIC SALPINGO OOPHERECTOMY Bilateral 05/05/2017   Procedure: LAPAROSCOPIC BILATERAL SALPINGO OOPHORECTOMY;  Surgeon: Rubie Maid, MD;  Location: [facility];  Service: Gynecology;  Laterality: Bilateral;  . LAPAROSCOPY N/A 04/11/2017   Procedure: LAPAROSCOPY DIAGNOSTIC;  Surgeon: Rubie Maid, MD;  Location: [facility];  Service: Gynecology;  Laterality: N/A;  . PARTIAL HYSTERECTOMY     Ovaries and Tubes  . TUBAL LIGATION      Family History  Problem Relation Age of Onset  . Schizophrenia Mother   . Bipolar disorder Mother   . Alcohol abuse Father   . Hypertension Father   . Seizures Paternal Grandmother   . Diabetes Paternal Grandmother   . Hypertension Brother   . Hypertension Maternal Grandmother     SOCIAL HX: reviewed.    Current Outpatient Medications:  .  amLODipine (NORVASC) 5 MG tablet, Take 1 tablet (5 mg total) by mouth daily., Disp: 90 tablet, Rfl:  3 .  baclofen (LIORESAL) 10 MG tablet, Take 1 tablet (10 mg total) by mouth daily as needed for muscle spasms., Disp: 30 tablet, Rfl: 2 .  doxycycline (PERIOSTAT) 20 MG tablet, Take 1 tablet (20 mg total) by mouth daily., Disp: 90 tablet, Rfl: 0 .  fexofenadine (ALLEGRA ALLERGY) 180 MG tablet, Take 1 tablet (180 mg total) by mouth daily., Disp: 90 tablet, Rfl: 1 .  latanoprost (XALATAN) 0.005 % ophthalmic solution, Place 1 drop into both eyes  at bedtime., Disp: , Rfl:  .  Melatonin 10 MG TABS, Take 10 mg by mouth at bedtime., Disp: , Rfl:  .  meloxicam (MOBIC) 7.5 MG tablet, Take 1 tablet (7.5 mg total) by mouth daily as needed for pain. With food, Disp: 90 tablet, Rfl: 0 .  montelukast (SINGULAIR) 10 MG tablet, Take 1 tablet (10 mg total) by mouth at bedtime., Disp: 30 tablet, Rfl: 1 .  nortriptyline (PAMELOR) 10 MG capsule, Take 1 capsule (10 mg total) by mouth at bedtime., Disp: 30 capsule, Rfl: 0 .  omeprazole (PRILOSEC) 20 MG capsule, Take 1 capsule (20 mg total) by mouth daily., Disp: 30 capsule, Rfl: 1 .  PARoxetine (PAXIL) 20 MG tablet, Take 1 tablet (20 mg total) by mouth daily., Disp: 30 tablet, Rfl: 1 .  propranolol (INDERAL) 40 MG tablet, Take 1 tablet (40 mg total) by mouth 2 (two) times daily., Disp: 60 tablet, Rfl: 1 .  SUMAtriptan (IMITREX) 100 MG tablet, May repeat in 2 hours if headache persists or recurs., Disp: 10 tablet, Rfl: 6 .  timolol (BETIMOL) 0.25 % ophthalmic solution, Place 1 drop into both eyes daily., Disp: , Rfl:  .  acetaminophen (TYLENOL) 325 MG tablet, Take by mouth., Disp: , Rfl:  .  albuterol (VENTOLIN HFA) 108 (90 Base) MCG/ACT inhaler, , Disp: , Rfl:  .  hydroxychloroquine (PLAQUENIL) 200 MG tablet, Take 200 mg by mouth 2 (two) times daily., Disp: , Rfl:   EXAM:  GENERAL: alert, oriented, appears well and in no acute distress  HEENT: atraumatic, conjunttiva clear, no obvious abnormalities on inspection of external nose and ears  NECK: normal movements of the head and neck  LUNGS: on inspection no signs of respiratory distress, breathing rate appears normal, no obvious gross SOB, gasping or wheezing  CV: no obvious cyanosis  PSYCH/NEURO: pleasant and cooperative, no obvious depression or anxiety, speech and thought processing grossly intact  ASSESSMENT AND PLAN:  Discussed the following assessment and plan:  Hypertension Follow pressures.  Follow metabolic panel.   Migraines Doing  well on propranolol.    Weight loss counseling, encounter for Discussed diet and exercise.  Will send [facility] diet and Dr Derrel Nip diet.  Follow.    Hair loss Check cbc, met c and tsh.    Hyperglycemia Low carb diet and exercise.  Follow met b and a1c.     I discussed the assessment and treatment plan with the patient. The patient was provided an opportunity to ask questions and all were answered. The patient agreed with the plan and demonstrated an understanding of the instructions.   The patient was advised to call back or seek an in-person evaluation if the symptoms worsen or if the condition fails to improve as anticipated.  I provided 25 minutes of non-face-to-face time during this encounter.   Einar Pheasant, MD

## 2019-10-28 ENCOUNTER — Encounter: Payer: Self-pay | Admitting: Internal Medicine

## 2019-10-28 DIAGNOSIS — Z713 Dietary counseling and surveillance: Secondary | ICD-10-CM | POA: Insufficient documentation

## 2019-10-28 DIAGNOSIS — R739 Hyperglycemia, unspecified: Secondary | ICD-10-CM | POA: Insufficient documentation

## 2019-10-28 DIAGNOSIS — Z789 Other specified health status: Secondary | ICD-10-CM | POA: Insufficient documentation

## 2019-10-28 DIAGNOSIS — L659 Nonscarring hair loss, unspecified: Secondary | ICD-10-CM | POA: Insufficient documentation

## 2019-10-28 NOTE — Assessment & Plan Note (Signed)
Follow pressures.  Follow metabolic panel.

## 2019-10-28 NOTE — Assessment & Plan Note (Signed)
Check cbc, met c and tsh.    

## 2019-10-28 NOTE — Assessment & Plan Note (Signed)
Doing well on propranolol.

## 2019-10-28 NOTE — Assessment & Plan Note (Signed)
Low carb diet and exercise.  Follow met b and a1c.  

## 2019-10-28 NOTE — Assessment & Plan Note (Signed)
Discussed diet and exercise.  Will send [facility] diet and Dr Derrel Nip diet.  Follow.

## 2019-10-29 NOTE — Telephone Encounter (Signed)
This is Yvonne [name]'s pt.  I saw her for a work in appt.  She is now messaging me regarding her knee pain.  She sees Dr Loetta Rough for her RA.  I recommend following up with her if having knee pain and flare with RA.

## 2019-11-01 ENCOUNTER — Other Ambulatory Visit: Payer: Self-pay | Admitting: Family

## 2019-11-01 ENCOUNTER — Encounter: Payer: Self-pay | Admitting: Family

## 2019-11-01 DIAGNOSIS — M62838 Other muscle spasm: Secondary | ICD-10-CM

## 2019-11-07 NOTE — Telephone Encounter (Signed)
See note from 11/01/19 note ws sent by CMA and sent to RN without knowledge of message.

## 2019-11-12 ENCOUNTER — Other Ambulatory Visit: Payer: Self-pay | Admitting: Internal Medicine

## 2019-11-12 ENCOUNTER — Other Ambulatory Visit: Payer: Self-pay

## 2019-11-12 ENCOUNTER — Encounter: Payer: Self-pay | Admitting: Family

## 2019-11-12 ENCOUNTER — Other Ambulatory Visit: Payer: Self-pay | Admitting: Family

## 2019-11-12 DIAGNOSIS — R519 Headache, unspecified: Secondary | ICD-10-CM

## 2019-11-12 MED ORDER — SUMATRIPTAN SUCCINATE 100 MG PO TABS: ORAL_TABLET | ORAL | 2 refills | Status: DC

## 2019-11-12 NOTE — Progress Notes (Signed)
Blood pressure well controlled

## 2019-11-13 ENCOUNTER — Encounter: Payer: Self-pay | Admitting: Family

## 2019-11-19 ENCOUNTER — Ambulatory Visit (INDEPENDENT_AMBULATORY_CARE_PROVIDER_SITE_OTHER): Payer: Managed Care, Other (non HMO) | Admitting: Family

## 2019-11-19 ENCOUNTER — Encounter: Payer: Self-pay | Admitting: Family

## 2019-11-19 ENCOUNTER — Other Ambulatory Visit: Payer: Self-pay

## 2019-11-19 VITALS — BP 124/72 | Ht 62.0 in | Wt 235.0 lb

## 2019-11-19 DIAGNOSIS — I10 Essential (primary) hypertension: Secondary | ICD-10-CM

## 2019-11-19 DIAGNOSIS — G43809 Other migraine, not intractable, without status migrainosus: Secondary | ICD-10-CM | POA: Diagnosis not present

## 2019-11-19 DIAGNOSIS — J3089 Other allergic rhinitis: Secondary | ICD-10-CM

## 2019-11-19 MED ORDER — SUMATRIPTAN SUCCINATE 50 MG PO TABS: 50.0000 mg | ORAL_TABLET | Freq: Once | ORAL | 2 refills | Status: DC

## 2019-11-19 MED ORDER — ONDANSETRON 4 MG PO TBDP: 4.0000 mg | ORAL_TABLET | Freq: Once | ORAL | 1 refills | Status: AC

## 2019-11-19 MED ORDER — MONTELUKAST SODIUM 10 MG PO TABS: 10.0000 mg | ORAL_TABLET | Freq: Every day | ORAL | 1 refills | Status: DC

## 2019-11-19 NOTE — Progress Notes (Signed)
Virtual Visit via Video Note  I connected with@  on 11/19/19 at 12:00 PM EST by a video enabled telemedicine application and verified that I am speaking with the correct person using two identifiers.  Location patient: home Location provider:work or home office Persons participating in the virtual visit: patient, provider  I discussed the limitations of evaluation and management by telemedicine and the availability of in person appointments. The patient expressed understanding and agreed to proceed.   HPI:  Follow for headaches Feels like HA are 'so far in between' and feels like overall, 'much better.' HAs feel like HA's in the past. HA's started when 53 years old.  No vision changes, numbness to face,difficulty speaking, flashing lights, aura, nausea, vomiting.   Cold weather, weather changes, neck pain ( 'eat up with arthritis')  are trigger.  Has had imitrex however doesn't 'like loopy feeling'. Tried cutting them in half , 50 mg, which worked better, side effects less. May have to take 50mg  ( other half) a couple of hours later with resolve.  [facility] pharmacist said we couldn't do the imitrex to 50mg  however she wouldn't have as much pills in the bottle.  Nortriptyline and propranolol work well.   Follows with neurology, Dr Melrose Nakayama  HTN- well controlled. 115/76. No CP.   Declines mammogram and colonoscopy due to COVID.   ROS: See pertinent positives and negatives per HPI.  Past Medical History:  Diagnosis Date  . Allergy   . Arthritis, rheumatoid ([place])   . GERD (gastroesophageal reflux disease)   . Gestational diabetes    patient denies  . Glaucoma   . H/O bronchitis   . Headache    migraine  . History of ovarian cyst   . Hypertension    pre-eclampsia with first child  . Increased pressure in the eye, bilateral     Past Surgical History:  Procedure Laterality Date  . CERVICAL POLYPECTOMY N/A 04/11/2017   Procedure: CERVICAL POLYPECTOMY;  Surgeon: Rubie Maid, MD;   Location: [facility];  Service: Gynecology;  Laterality: N/A;  . HYSTEROSCOPY WITH D & C N/A 04/11/2017   Procedure: DILATATION AND CURETTAGE /HYSTEROSCOPY;  Surgeon: Rubie Maid, MD;  Location: [facility];  Service: Gynecology;  Laterality: N/A;  . IUD REMOVAL N/A 05/05/2017   Procedure: INTRAUTERINE DEVICE (IUD) REMOVAL;  Surgeon: Rubie Maid, MD;  Location: [facility];  Service: Gynecology;  Laterality: N/A;  . LAPAROSCOPIC SALPINGO OOPHERECTOMY Bilateral 05/05/2017   Procedure: LAPAROSCOPIC BILATERAL SALPINGO OOPHORECTOMY;  Surgeon: Rubie Maid, MD;  Location: [facility];  Service: Gynecology;  Laterality: Bilateral;  . LAPAROSCOPY N/A 04/11/2017   Procedure: LAPAROSCOPY DIAGNOSTIC;  Surgeon: Rubie Maid, MD;  Location: [facility];  Service: Gynecology;  Laterality: N/A;  . PARTIAL HYSTERECTOMY     Ovaries and Tubes  . TUBAL LIGATION      Family History  Problem Relation Age of Onset  . Schizophrenia Mother   . Bipolar disorder Mother   . Alcohol abuse Father   . Hypertension Father   . Seizures Paternal Grandmother   . Diabetes Paternal Grandmother   . Hypertension Brother   . Hypertension Maternal Grandmother     SOCIAL HX: former smoker  Current Outpatient Medications:  .  acetaminophen (TYLENOL) 325 MG tablet, Take by mouth., Disp: , Rfl:  .  albuterol (VENTOLIN HFA) 108 (90 Base) MCG/ACT inhaler, , Disp: , Rfl:  .  amLODipine (NORVASC) 5 MG tablet, Take 1 tablet (5 mg total) by mouth daily., Disp: 90 tablet, Rfl: 3 .  baclofen (LIORESAL) 10 MG tablet, Take 1 tablet (10 mg total) by mouth daily as needed for muscle spasms., Disp: 30 tablet, Rfl: 0 .  doxycycline (PERIOSTAT) 20 MG tablet, Take 1 tablet (20 mg total) by mouth daily., Disp: 90 tablet, Rfl: 0 .  fexofenadine (ALLEGRA ALLERGY) 180 MG tablet, Take 1 tablet (180 mg total) by mouth daily., Disp: 90 tablet, Rfl: 1 .  hydroxychloroquine (PLAQUENIL) 200 MG tablet, Take 200 mg by mouth 2 (two) times daily., Disp: , Rfl:  .   latanoprost (XALATAN) 0.005 % ophthalmic solution, Place 1 drop into both eyes at bedtime., Disp: , Rfl:  .  Melatonin 10 MG TABS, Take 10 mg by mouth at bedtime., Disp: , Rfl:  .  meloxicam (MOBIC) 7.5 MG tablet, Take 1 tablet (7.5 mg total) by mouth daily as needed for pain. With food, Disp: 90 tablet, Rfl: 0 .  nortriptyline (PAMELOR) 10 MG capsule, Take 1 capsule (10 mg total) by mouth at bedtime., Disp: 30 capsule, Rfl: 0 .  omeprazole (PRILOSEC) 20 MG capsule, Take 1 capsule (20 mg total) by mouth daily., Disp: 30 capsule, Rfl: 1 .  PARoxetine (PAXIL) 20 MG tablet, Take 1 tablet (20 mg total) by mouth daily., Disp: 30 tablet, Rfl: 1 .  propranolol (INDERAL) 40 MG tablet, Take 1 tablet (40 mg total) by mouth 2 (two) times daily., Disp: 60 tablet, Rfl: 1 .  timolol (BETIMOL) 0.25 % ophthalmic solution, Place 1 drop into both eyes daily., Disp: , Rfl:  .  montelukast (SINGULAIR) 10 MG tablet, Take 1 tablet (10 mg total) by mouth at bedtime., Disp: 90 tablet, Rfl: 1 .  ondansetron (ZOFRAN ODT) 4 MG disintegrating tablet, Take 1 tablet (4 mg total) by mouth once for 1 dose. For headaches, Disp: 30 tablet, Rfl: 1 .  SUMAtriptan (IMITREX) 50 MG tablet, Take 1 tablet (50 mg total) by mouth once for 1 dose. May repeat in 2 hours if headache persists or recurs., Disp: 10 tablet, Rfl: 2  EXAM:  VITALS per patient if applicable: BP Readings from Last 3 Encounters:  11/19/19 124/72  05/03/19 122/78  02/16/19 108/73     GENERAL: alert, oriented, appears well and in no acute distress  HEENT: atraumatic, conjunttiva clear, no obvious abnormalities on inspection of external nose and ears  NECK: normal movements of the head and neck  LUNGS: on inspection no signs of respiratory distress, breathing rate appears normal, no obvious gross SOB, gasping or wheezing  CV: no obvious cyanosis  MS: moves all visible extremities without noticeable abnormality  PSYCH/NEURO: pleasant and cooperative, no  obvious depression or anxiety, speech and thought processing grossly intact  ASSESSMENT AND PLAN:  Discussed the following assessment and plan:  Other migraine without status migrainosus, not intractable - Plan: SUMAtriptan (IMITREX) 50 MG tablet, ondansetron (ZOFRAN ODT) 4 MG disintegrating tablet  Hypertension, unspecified type  Problem List Items Addressed This Visit      Cardiovascular and Mediastinum   Hypertension    Well controlled . Continue regimen.       Migraines - Primary    Overall improved, infrequent. Will stay on preventative medications. Since on mobic QD, will trial low-dose Zofran to see if patient is less sedated.  If ineffective alone, will discuss abortive therapy with naproxen sodium plus Zofran since patient has very infrequent headaches.  She will let me know how she is doing       Relevant Medications   SUMAtriptan (IMITREX) 50 MG tablet   ondansetron (  ZOFRAN ODT) 4 MG disintegrating tablet     Of note, patient politely declined referral for colonoscopy or scheduling mammogram today, she would like to wait until Covid has improved.  She will call the office when she is ready. -we discussed possible serious and likely etiologies, options for evaluation and workup, limitations of telemedicine visit vs in person visit, treatment, treatment risks and precautions. Pt prefers to treat via telemedicine empirically rather then risking or undertaking an in person visit at this moment. Patient agrees to seek prompt in person care if worsening, new symptoms arise, or if is not improving with treatment.   I discussed the assessment and treatment plan with the patient. The patient was provided an opportunity to ask questions and all were answered. The patient agreed with the plan and demonstrated an understanding of the instructions.   The patient was advised to call back or seek an in-person evaluation if the symptoms worsen or if the condition fails to improve as  anticipated.   Mable Paris, FNP

## 2019-11-19 NOTE — Assessment & Plan Note (Addendum)
Overall improved, infrequent. Will stay on preventative medications. Since on mobic QD, will trial low-dose Zofran to see if patient is less sedated.  If ineffective alone, will discuss abortive therapy with naproxen sodium plus Zofran since patient has very infrequent headaches.  She will let me know how she is doing

## 2019-11-19 NOTE — Assessment & Plan Note (Signed)
Well controlled. Continue regimen.

## 2019-11-25 ENCOUNTER — Encounter: Payer: Self-pay | Admitting: Family

## 2019-11-28 ENCOUNTER — Other Ambulatory Visit: Payer: Self-pay | Admitting: Family

## 2019-11-30 DIAGNOSIS — B029 Zoster without complications: Secondary | ICD-10-CM

## 2019-11-30 HISTORY — DX: Zoster without complications: B02.9

## 2019-12-03 ENCOUNTER — Other Ambulatory Visit: Payer: Self-pay | Admitting: Family

## 2019-12-03 DIAGNOSIS — I10 Essential (primary) hypertension: Secondary | ICD-10-CM

## 2019-12-10 ENCOUNTER — Other Ambulatory Visit: Payer: Self-pay | Admitting: Internal Medicine

## 2019-12-10 ENCOUNTER — Other Ambulatory Visit: Payer: Self-pay | Admitting: Family

## 2019-12-10 DIAGNOSIS — R519 Headache, unspecified: Secondary | ICD-10-CM

## 2019-12-10 DIAGNOSIS — M62838 Other muscle spasm: Secondary | ICD-10-CM

## 2019-12-17 ENCOUNTER — Other Ambulatory Visit: Payer: Self-pay | Admitting: Internal Medicine

## 2019-12-17 DIAGNOSIS — R519 Headache, unspecified: Secondary | ICD-10-CM

## 2019-12-17 DIAGNOSIS — I10 Essential (primary) hypertension: Secondary | ICD-10-CM

## 2019-12-26 ENCOUNTER — Encounter: Payer: Self-pay | Admitting: Family

## 2019-12-28 ENCOUNTER — Encounter: Payer: Self-pay | Admitting: Family

## 2019-12-31 ENCOUNTER — Encounter: Payer: Self-pay | Admitting: Family

## 2020-01-01 ENCOUNTER — Other Ambulatory Visit: Payer: Self-pay

## 2020-01-01 ENCOUNTER — Ambulatory Visit (INDEPENDENT_AMBULATORY_CARE_PROVIDER_SITE_OTHER): Payer: Managed Care, Other (non HMO) | Admitting: Family

## 2020-01-01 ENCOUNTER — Encounter: Payer: Self-pay | Admitting: Family

## 2020-01-01 VITALS — Ht 62.0 in | Wt 235.0 lb

## 2020-01-01 DIAGNOSIS — M79604 Pain in right leg: Secondary | ICD-10-CM

## 2020-01-01 DIAGNOSIS — G43809 Other migraine, not intractable, without status migrainosus: Secondary | ICD-10-CM | POA: Diagnosis not present

## 2020-01-01 DIAGNOSIS — G2581 Restless legs syndrome: Secondary | ICD-10-CM

## 2020-01-01 DIAGNOSIS — M79605 Pain in left leg: Secondary | ICD-10-CM | POA: Diagnosis not present

## 2020-01-01 MED ORDER — GABAPENTIN 100 MG PO CAPS: 100.0000 mg | ORAL_CAPSULE | Freq: Two times a day (BID) | ORAL | 3 refills | Status: DC

## 2020-01-01 MED ORDER — ONDANSETRON 4 MG PO TBDP: 4.0000 mg | ORAL_TABLET | Freq: Three times a day (TID) | ORAL | 1 refills | Status: DC | PRN

## 2020-01-01 NOTE — Assessment & Plan Note (Signed)
Improved on zofran. Will continue for now.

## 2020-01-01 NOTE — Addendum Note (Signed)
Addended by: Leeanne Rio on: 01/01/2020 12:10 PM   Modules accepted: Orders

## 2020-01-01 NOTE — Assessment & Plan Note (Addendum)
Suspected. Pending iron stores, lab evaluation. Trial of gabapentin.  Follow up 3 weeks.

## 2020-01-01 NOTE — Progress Notes (Signed)
Virtual Visit via Video Note  I connected with@  on 01/01/20 at 11:30 AM EST by a video enabled telemedicine application and verified that I am speaking with the correct person using two identifiers.  Location patient: home Location provider:work  Persons participating in the virtual visit: patient, provider  I discussed the limitations of evaluation and management by telemedicine and the availability of in person appointments. The patient expressed understanding and agreed to proceed.   HPI: Chief complaint of concern of 'restless leg syndrome', worsening. Started one year ago. Affects both legs the same. NO weakness, numbness, swelling in the legs, vision changes.  Has bothered her for some time and hasnt said anything. Describes a 'pulling' sensation and noticed at night and during the day as well. Feels like an 'ache' and has to 'move to relief it.' Affecting sleep.    No DM.   Due for pap smear ; follows with Dr Marcelline Mates for [place].  Due colonoscopy- declines referral today.  Migraines- improved. Using zofran for rescue with relief.  Will   ROS: See pertinent positives and negatives per HPI.  Past Medical History:  Diagnosis Date  . Allergy   . Arthritis, rheumatoid ([place])   . GERD (gastroesophageal reflux disease)   . Gestational diabetes    patient denies  . Glaucoma   . H/O bronchitis   . Headache    migraine  . History of ovarian cyst   . Hypertension    pre-eclampsia with first child  . Increased pressure in the eye, bilateral     Past Surgical History:  Procedure Laterality Date  . CERVICAL POLYPECTOMY N/A 04/11/2017   Procedure: CERVICAL POLYPECTOMY;  Surgeon: Rubie Maid, MD;  Location: [facility];  Service: Gynecology;  Laterality: N/A;  . HYSTEROSCOPY WITH D & C N/A 04/11/2017   Procedure: DILATATION AND CURETTAGE /HYSTEROSCOPY;  Surgeon: Rubie Maid, MD;  Location: [facility];  Service: Gynecology;  Laterality: N/A;  . IUD REMOVAL N/A 05/05/2017   Procedure:  INTRAUTERINE DEVICE (IUD) REMOVAL;  Surgeon: Rubie Maid, MD;  Location: [facility];  Service: Gynecology;  Laterality: N/A;  . LAPAROSCOPIC SALPINGO OOPHERECTOMY Bilateral 05/05/2017   Procedure: LAPAROSCOPIC BILATERAL SALPINGO OOPHORECTOMY;  Surgeon: Rubie Maid, MD;  Location: [facility];  Service: Gynecology;  Laterality: Bilateral;  . LAPAROSCOPY N/A 04/11/2017   Procedure: LAPAROSCOPY DIAGNOSTIC;  Surgeon: Rubie Maid, MD;  Location: [facility];  Service: Gynecology;  Laterality: N/A;  . PARTIAL HYSTERECTOMY     Ovaries and Tubes  . TUBAL LIGATION      Family History  Problem Relation Age of Onset  . Schizophrenia Mother   . Bipolar disorder Mother   . Alcohol abuse Father   . Hypertension Father   . Seizures Paternal Grandmother   . Diabetes Paternal Grandmother   . Hypertension Brother   . Hypertension Maternal Grandmother     SOCIAL HX: former smoker   Current Outpatient Medications:  .  acetaminophen (TYLENOL) 325 MG tablet, Take by mouth., Disp: , Rfl:  .  amLODipine (NORVASC) 5 MG tablet, Take 1 tablet (5 mg total) by mouth daily., Disp: 30 tablet, Rfl: 0 .  baclofen (LIORESAL) 10 MG tablet, Take 1 tablet (10 mg total) by mouth daily as needed for muscle spasms., Disp: 30 tablet, Rfl: 0 .  doxycycline (PERIOSTAT) 20 MG tablet, Take 1 tablet (20 mg total) by mouth daily., Disp: 30 tablet, Rfl: 0 .  fexofenadine (ALLEGRA ALLERGY) 180 MG tablet, Take 1 tablet (180 mg total) by mouth daily., Disp:  90 tablet, Rfl: 1 .  hydroxychloroquine (PLAQUENIL) 200 MG tablet, Take 200 mg by mouth 2 (two) times daily., Disp: , Rfl:  .  latanoprost (XALATAN) 0.005 % ophthalmic solution, Place 1 drop into both eyes at bedtime., Disp: , Rfl:  .  Melatonin 10 MG TABS, Take 10 mg by mouth at bedtime., Disp: , Rfl:  .  meloxicam (MOBIC) 7.5 MG tablet, Take 1 tablet (7.5 mg total) by mouth daily as needed for pain. With food, Disp: 90 tablet, Rfl: 0 .  montelukast (SINGULAIR) 10 MG tablet, Take 1  tablet (10 mg total) by mouth at bedtime., Disp: 90 tablet, Rfl: 1 .  nortriptyline (PAMELOR) 10 MG capsule, Take 1 capsule (10 mg total) by mouth at bedtime., Disp: 30 capsule, Rfl: 0 .  omeprazole (PRILOSEC) 20 MG capsule, Take 1 capsule (20 mg total) by mouth daily., Disp: 30 capsule, Rfl: 0 .  PARoxetine (PAXIL) 20 MG tablet, Take 1 tablet (20 mg total) by mouth daily., Disp: 30 tablet, Rfl: 1 .  propranolol (INDERAL) 40 MG tablet, Take 1 tablet (40 mg total) by mouth 2 (two) times daily., Disp: 60 tablet, Rfl: 0 .  timolol (BETIMOL) 0.25 % ophthalmic solution, Place 1 drop into both eyes daily., Disp: , Rfl:  .  gabapentin (NEURONTIN) 100 MG capsule, Take 1 capsule (100 mg total) by mouth 2 (two) times daily., Disp: 60 capsule, Rfl: 3 .  ondansetron (ZOFRAN ODT) 4 MG disintegrating tablet, Take 1 tablet (4 mg total) by mouth every 8 (eight) hours as needed for nausea or vomiting., Disp: 30 tablet, Rfl: 1 .  SUMAtriptan (IMITREX) 50 MG tablet, Take 1 tablet (50 mg total) by mouth once for 1 dose. May repeat in 2 hours if headache persists or recurs., Disp: 10 tablet, Rfl: 2  EXAM:  VITALS per patient if applicable:  GENERAL: alert, oriented, appears well and in no acute distress  HEENT: atraumatic, conjunttiva clear, no obvious abnormalities on inspection of external nose and ears  NECK: normal movements of the head and neck  LUNGS: on inspection no signs of respiratory distress, breathing rate appears normal, no obvious gross SOB, gasping or wheezing  CV: no obvious cyanosis  MS: moves all visible extremities without noticeable abnormality  PSYCH/NEURO: pleasant and cooperative, no obvious depression or anxiety, speech and thought processing grossly intact  ASSESSMENT AND PLAN:  Discussed the following assessment and plan:  Restless leg - Plan: TSH, CBC with Differential/Platelet, IBC + Ferritin, B12 and Folate Panel, gabapentin (NEURONTIN) 100 MG capsule  Other migraine  without status migrainosus, not intractable - Plan: ondansetron (ZOFRAN ODT) 4 MG disintegrating tablet Problem List Items Addressed This Visit      Cardiovascular and Mediastinum   Migraines    Improved on zofran. Will continue for now.       Relevant Medications   ondansetron (ZOFRAN ODT) 4 MG disintegrating tablet   gabapentin (NEURONTIN) 100 MG capsule     Other   Restless leg - Primary    Suspected. Pending iron stores, lab evaluation. Trial of gabapentin.  Follow up 3 weeks.        Relevant Medications   gabapentin (NEURONTIN) 100 MG capsule   Other Relevant Orders   TSH   CBC with Differential/Platelet   IBC + Ferritin   B12 and Folate Panel      -we discussed possible serious and likely etiologies, options for evaluation and workup, limitations of telemedicine visit vs in person visit, treatment, treatment risks and precautions.  Pt prefers to treat via telemedicine empirically rather then risking or undertaking an in person visit at this moment. Patient agrees to seek prompt in person care if worsening, new symptoms arise, or if is not improving with treatment.   I discussed the assessment and treatment plan with the patient. The patient was provided an opportunity to ask questions and all were answered. The patient agreed with the plan and demonstrated an understanding of the instructions.   The patient was advised to call back or seek an in-person evaluation if the symptoms worsen or if the condition fails to improve as anticipated.   Mable Paris, FNP

## 2020-01-02 ENCOUNTER — Encounter: Payer: Self-pay | Admitting: Family

## 2020-01-03 ENCOUNTER — Encounter: Payer: Self-pay | Admitting: Family

## 2020-01-08 ENCOUNTER — Other Ambulatory Visit: Payer: Self-pay | Admitting: Internal Medicine

## 2020-01-08 DIAGNOSIS — M62838 Other muscle spasm: Secondary | ICD-10-CM

## 2020-01-10 ENCOUNTER — Other Ambulatory Visit: Payer: Self-pay | Admitting: Family

## 2020-01-10 ENCOUNTER — Encounter: Payer: Self-pay | Admitting: Family

## 2020-01-10 ENCOUNTER — Telehealth: Payer: Self-pay

## 2020-01-10 DIAGNOSIS — R519 Headache, unspecified: Secondary | ICD-10-CM

## 2020-01-10 NOTE — Telephone Encounter (Signed)
I spoke with patient today about some ear pain that she has experienced in the past. She really wanted Joycelyn Schmid to call her something in or speak with her. She is out of office today & did not want to seek help for UC across the street. Patient stated that she felt that she got water in her ear while showering. She aid that she would send Joice Lofts message & ask if I would be on look out for it.I told her that I would forward to [facility] & be in touch with her advise.

## 2020-01-10 NOTE — Progress Notes (Signed)
Printed and mailed

## 2020-01-11 ENCOUNTER — Ambulatory Visit (INDEPENDENT_AMBULATORY_CARE_PROVIDER_SITE_OTHER): Payer: Managed Care, Other (non HMO) | Admitting: Family

## 2020-01-11 ENCOUNTER — Encounter: Payer: Self-pay | Admitting: Family

## 2020-01-11 VITALS — Ht 62.0 in | Wt 235.0 lb

## 2020-01-11 DIAGNOSIS — H6690 Otitis media, unspecified, unspecified ear: Secondary | ICD-10-CM | POA: Insufficient documentation

## 2020-01-11 MED ORDER — AMOXICILLIN-POT CLAVULANATE 875-125 MG PO TABS: 1.0000 | ORAL_TABLET | Freq: Two times a day (BID) | ORAL | 0 refills | Status: AC

## 2020-01-11 NOTE — Assessment & Plan Note (Addendum)
Discussed limitations on a virtual platform.  Patient states this symptom feels similar to ear infections that she has had in the past.  In the past she did well on amoxicillin.  Advised that Augmentin is a first-line antibiotic.  She will hold doxycycline for 4 weeks. Start Augmentin, probiotics.  Advised of diarrheal infections particularly as patient is been on doxycycline.  She will stay vigilant for this.  She will let me know if not improved

## 2020-01-11 NOTE — Progress Notes (Signed)
Virtual Visit via Video Note  I connected with@  on 01/11/20 at  9:30 AM EST by a video enabled telemedicine application and verified that I am speaking with the correct person using two identifiers.  Location patient: home Location provider:work  Persons participating in the virtual visit: patient, provider  I discussed the limitations of evaluation and management by telemedicine and the availability of in person appointments. The patient expressed understanding and agreed to proceed.   HPI: Work in appointment Acute visit CC: Right otalgia , 'on the verge of hurting' x 4 days, unchanged.  Get 'swimmy headed' when moves a head a way. 'I wouldn't say that Im dizzy.' Describes ear pain started after getting water in her ear 3 weeks ago.  Can put finger in her ear and feels that water may be in the ear canal. No blood or purulence from the ear. No fever, congestion, sinus pain, sore throat, facial swelling, cough.  No HA today.   Acne-she is on doxycycline for this. She eats yogurt every day.   ROS: See pertinent positives and negatives per HPI.  Past Medical History:  Diagnosis Date  . Allergy   . Arthritis, rheumatoid ([place])   . GERD (gastroesophageal reflux disease)   . Gestational diabetes    patient denies  . Glaucoma   . H/O bronchitis   . Headache    migraine  . History of ovarian cyst   . Hypertension    pre-eclampsia with first child  . Increased pressure in the eye, bilateral     Past Surgical History:  Procedure Laterality Date  . CERVICAL POLYPECTOMY N/A 04/11/2017   Procedure: CERVICAL POLYPECTOMY;  Surgeon: Rubie Maid, MD;  Location: [facility];  Service: Gynecology;  Laterality: N/A;  . HYSTEROSCOPY WITH D & C N/A 04/11/2017   Procedure: DILATATION AND CURETTAGE /HYSTEROSCOPY;  Surgeon: Rubie Maid, MD;  Location: [facility];  Service: Gynecology;  Laterality: N/A;  . IUD REMOVAL N/A 05/05/2017   Procedure: INTRAUTERINE DEVICE (IUD) REMOVAL;  Surgeon:  Rubie Maid, MD;  Location: [facility];  Service: Gynecology;  Laterality: N/A;  . LAPAROSCOPIC SALPINGO OOPHERECTOMY Bilateral 05/05/2017   Procedure: LAPAROSCOPIC BILATERAL SALPINGO OOPHORECTOMY;  Surgeon: Rubie Maid, MD;  Location: [facility];  Service: Gynecology;  Laterality: Bilateral;  . LAPAROSCOPY N/A 04/11/2017   Procedure: LAPAROSCOPY DIAGNOSTIC;  Surgeon: Rubie Maid, MD;  Location: [facility];  Service: Gynecology;  Laterality: N/A;  . PARTIAL HYSTERECTOMY     Ovaries and Tubes  . TUBAL LIGATION      Family History  Problem Relation Age of Onset  . Schizophrenia Mother   . Bipolar disorder Mother   . Alcohol abuse Father   . Hypertension Father   . Seizures Paternal Grandmother   . Diabetes Paternal Grandmother   . Hypertension Brother   . Hypertension Maternal Grandmother     SOCIAL HX: former smoker   Current Outpatient Medications:  .  acetaminophen (TYLENOL) 325 MG tablet, Take by mouth., Disp: , Rfl:  .  amLODipine (NORVASC) 5 MG tablet, Take 1 tablet (5 mg total) by mouth daily., Disp: 30 tablet, Rfl: 0 .  baclofen (LIORESAL) 10 MG tablet, Take 1 tablet (10 mg total) by mouth daily as needed for muscle spasms., Disp: 30 tablet, Rfl: 0 .  doxycycline (PERIOSTAT) 20 MG tablet, Take 1 tablet (20 mg total) by mouth daily., Disp: 30 tablet, Rfl: 0 .  fexofenadine (ALLEGRA ALLERGY) 180 MG tablet, Take 1 tablet (180 mg total) by mouth daily., Disp: 90  tablet, Rfl: 1 .  gabapentin (NEURONTIN) 100 MG capsule, Take 1 capsule (100 mg total) by mouth 2 (two) times daily., Disp: 60 capsule, Rfl: 3 .  hydroxychloroquine (PLAQUENIL) 200 MG tablet, Take 200 mg by mouth 2 (two) times daily., Disp: , Rfl:  .  latanoprost (XALATAN) 0.005 % ophthalmic solution, Place 1 drop into both eyes at bedtime., Disp: , Rfl:  .  Melatonin 10 MG TABS, Take 10 mg by mouth at bedtime., Disp: , Rfl:  .  meloxicam (MOBIC) 7.5 MG tablet, Take 1 tablet (7.5 mg total) by mouth daily as needed for pain.  With food, Disp: 90 tablet, Rfl: 0 .  montelukast (SINGULAIR) 10 MG tablet, Take 1 tablet (10 mg total) by mouth at bedtime., Disp: 90 tablet, Rfl: 1 .  nortriptyline (PAMELOR) 10 MG capsule, Take 1 capsule (10 mg total) by mouth at bedtime., Disp: 30 capsule, Rfl: 0 .  omeprazole (PRILOSEC) 20 MG capsule, Take 1 capsule (20 mg total) by mouth daily., Disp: 30 capsule, Rfl: 0 .  ondansetron (ZOFRAN ODT) 4 MG disintegrating tablet, Take 1 tablet (4 mg total) by mouth every 8 (eight) hours as needed for nausea or vomiting., Disp: 30 tablet, Rfl: 1 .  PARoxetine (PAXIL) 20 MG tablet, Take 1 tablet (20 mg total) by mouth daily., Disp: 30 tablet, Rfl: 1 .  propranolol (INDERAL) 40 MG tablet, Take 1 tablet (40 mg total) by mouth 2 (two) times daily., Disp: 60 tablet, Rfl: 0 .  timolol (BETIMOL) 0.25 % ophthalmic solution, Place 1 drop into both eyes daily., Disp: , Rfl:  .  amoxicillin-clavulanate (AUGMENTIN) 875-125 MG tablet, Take 1 tablet by mouth 2 (two) times daily for 7 days., Disp: 14 tablet, Rfl: 0 .  SUMAtriptan (IMITREX) 50 MG tablet, Take 1 tablet (50 mg total) by mouth once for 1 dose. May repeat in 2 hours if headache persists or recurs., Disp: 10 tablet, Rfl: 2  EXAM:  VITALS per patient if applicable:  GENERAL: alert, oriented, appears well and in no acute distress  HEENT: atraumatic, conjunttiva clear, no obvious abnormalities on inspection of external nose and ears  NECK: normal movements of the head and neck  LUNGS: on inspection no signs of respiratory distress, breathing rate appears normal, no obvious gross SOB, gasping or wheezing  CV: no obvious cyanosis  MS: moves all visible extremities without noticeable abnormality  PSYCH/NEURO: pleasant and cooperative, no obvious depression or anxiety, speech and thought processing grossly intact  ASSESSMENT AND PLAN:  Discussed the following assessment and plan:  Acute otitis media, unspecified otitis media type - Plan:  amoxicillin-clavulanate (AUGMENTIN) 875-125 MG tablet Problem List Items Addressed This Visit      Nervous and Auditory   Otitis media - Primary    Discussed limitations on a virtual platform.  Patient states this symptom feels similar to ear infections that she has had in the past.  In the past she did well on amoxicillin.  Advised that Augmentin is a first-line antibiotic.  She will hold doxycycline for 4 weeks. Start Augmentin, probiotics.  Advised of diarrheal infections particularly as patient is been on doxycycline.  She will stay vigilant for this.  She will let me know if not improved      Relevant Medications   amoxicillin-clavulanate (AUGMENTIN) 875-125 MG tablet      -we discussed possible serious and likely etiologies, options for evaluation and workup, limitations of telemedicine visit vs in person visit, treatment, treatment risks and precautions. Pt prefers to  treat via telemedicine empirically rather then risking or undertaking an in person visit at this moment. Patient agrees to seek prompt in person care if worsening, new symptoms arise, or if is not improving with treatment.   I discussed the assessment and treatment plan with the patient. The patient was provided an opportunity to ask questions and all were answered. The patient agreed with the plan and demonstrated an understanding of the instructions.   The patient was advised to call back or seek an in-person evaluation if the symptoms worsen or if the condition fails to improve as anticipated.   Mable Paris, FNP

## 2020-01-14 ENCOUNTER — Encounter: Payer: Self-pay | Admitting: Family

## 2020-01-14 ENCOUNTER — Other Ambulatory Visit: Payer: Self-pay | Admitting: Internal Medicine

## 2020-01-14 DIAGNOSIS — R519 Headache, unspecified: Secondary | ICD-10-CM

## 2020-01-14 DIAGNOSIS — I10 Essential (primary) hypertension: Secondary | ICD-10-CM

## 2020-01-15 ENCOUNTER — Other Ambulatory Visit: Payer: Self-pay | Admitting: Family

## 2020-01-15 DIAGNOSIS — G2581 Restless legs syndrome: Secondary | ICD-10-CM

## 2020-01-15 MED ORDER — GABAPENTIN 100 MG PO CAPS: 200.0000 mg | ORAL_CAPSULE | Freq: Three times a day (TID) | ORAL | 3 refills | Status: DC

## 2020-01-17 ENCOUNTER — Ambulatory Visit: Payer: Managed Care, Other (non HMO)

## 2020-01-18 ENCOUNTER — Encounter: Payer: Self-pay | Admitting: Family

## 2020-01-24 ENCOUNTER — Other Ambulatory Visit: Payer: Managed Care, Other (non HMO)

## 2020-01-24 ENCOUNTER — Other Ambulatory Visit: Payer: Self-pay

## 2020-01-24 VITALS — BP 122/80 | HR 68 | Temp 97.0°F | Resp 18 | Ht 62.0 in | Wt 240.0 lb

## 2020-01-24 DIAGNOSIS — G2581 Restless legs syndrome: Secondary | ICD-10-CM | POA: Diagnosis not present

## 2020-01-24 DIAGNOSIS — Z008 Encounter for other general examination: Secondary | ICD-10-CM | POA: Diagnosis not present

## 2020-01-24 NOTE — Addendum Note (Signed)
Addended by: Jay Schlichter D on: 01/24/2020 11:18 AM   Modules accepted: Orders

## 2020-01-24 NOTE — Addendum Note (Signed)
Addended by: Johnn Hai on: 01/24/2020 10:52 AM   Modules accepted: Level of Service

## 2020-01-24 NOTE — Addendum Note (Signed)
Addended by: Jay Schlichter D on: 01/24/2020 11:08 AM   Modules accepted: Orders

## 2020-01-24 NOTE — Progress Notes (Signed)
HPI:  54 year old female here for biometric yearly screening.  Labs drawn this am.  Medication reviewed.  No new problems other than patient states that she had to quarantine twice due to children getting Covid.  She did not get sick, but states that she did gain weight during this time.    Fam Hx and Social hx reviewed  Allergies reviewed  Head:  No abnormality HENT:  Sclera clear bilat.  PERRL, EOMI Neck:  Supple without adenopathy. Lungs clear bilateral and unlabored. Heart  RRR without m. Abd:  Soft, non-tender.  BS normoactive x 4 quads. Ext:  Moves upper and lower extremities without any difficulty and normal gait noted.  No edema lower extremities. Skin:  No rashes noted.    Plan:  She will continue taking her regular medications.  Labs were done and patient is aware that she can see them on my chart.

## 2020-01-25 LAB — CBC WITH DIFFERENTIAL/PLATELET
Basophils Absolute: 0 10*3/uL (ref 0.0–0.2)
Basos: 1 %
EOS (ABSOLUTE): 0 10*3/uL (ref 0.0–0.4)
Eos: 0 %
Hematocrit: 45 % (ref 34.0–46.6)
Hemoglobin: 15.3 g/dL (ref 11.1–15.9)
Immature Grans (Abs): 0 10*3/uL (ref 0.0–0.1)
Immature Granulocytes: 0 %
Lymphocytes Absolute: 2.1 10*3/uL (ref 0.7–3.1)
Lymphs: 43 %
MCH: 30.1 pg (ref 26.6–33.0)
MCHC: 34 g/dL (ref 31.5–35.7)
MCV: 88 fL (ref 79–97)
Monocytes Absolute: 0.4 10*3/uL (ref 0.1–0.9)
Monocytes: 8 %
Neutrophils Absolute: 2.4 10*3/uL (ref 1.4–7.0)
Neutrophils: 48 %
Platelets: 214 10*3/uL (ref 150–450)
RBC: 5.09 x10E6/uL (ref 3.77–5.28)
RDW: 12.9 % (ref 11.7–15.4)
WBC: 5 10*3/uL (ref 3.4–10.8)

## 2020-01-25 LAB — B12 AND FOLATE PANEL
Folate: 9.9 ng/mL (ref >3.0)
Vitamin B-12: 427 pg/mL (ref 232–1245)

## 2020-01-25 LAB — LIPID PANEL
Chol/HDL Ratio: 5.2 ratio — ABNORMAL HIGH (ref 0.0–4.4)
Cholesterol, Total: 280 mg/dL — ABNORMAL HIGH (ref 100–199)
HDL: 54 mg/dL (ref >39)
LDL Chol Calc (NIH): 194 mg/dL — ABNORMAL HIGH (ref 0–99)
Triglycerides: 172 mg/dL — ABNORMAL HIGH (ref 0–149)
VLDL Cholesterol Cal: 32 mg/dL (ref 5–40)

## 2020-01-25 LAB — TSH: TSH: 1.4 u[IU]/mL (ref 0.450–4.500)

## 2020-01-25 LAB — GLUCOSE, RANDOM: Glucose: 94 mg/dL (ref 65–99)

## 2020-01-25 LAB — FERRITIN: Ferritin: 116 ng/mL (ref 15–150)

## 2020-01-26 LAB — SPECIMEN STATUS REPORT

## 2020-01-26 LAB — IRON AND TIBC
Iron Saturation: 37 % (ref 15–55)
Iron: 140 ug/dL (ref 27–159)
Total Iron Binding Capacity: 381 ug/dL (ref 250–450)
UIBC: 241 ug/dL (ref 131–425)

## 2020-01-29 ENCOUNTER — Telehealth: Payer: Self-pay | Admitting: Family

## 2020-01-29 NOTE — Telephone Encounter (Signed)
Patient called and informed of results. Pt verbalized understanding.

## 2020-01-29 NOTE — Telephone Encounter (Signed)
Call patient I received lab results. Her cholesterol and LDL remains elevated.  However she is low cardiovascular risk.  Please advise low trans and saturated fat diet  normal B12 and folate  no anemia.  normal iron stores  BP Readings from Last 3 Encounters:  01/24/20 122/80  11/19/19 124/72  05/03/19 122/78

## 2020-02-04 ENCOUNTER — Other Ambulatory Visit: Payer: Self-pay | Admitting: Internal Medicine

## 2020-02-04 ENCOUNTER — Ambulatory Visit: Payer: Managed Care, Other (non HMO) | Admitting: Family

## 2020-02-04 DIAGNOSIS — M62838 Other muscle spasm: Secondary | ICD-10-CM

## 2020-02-08 ENCOUNTER — Encounter: Payer: Self-pay | Admitting: Family

## 2020-02-11 ENCOUNTER — Other Ambulatory Visit: Payer: Self-pay | Admitting: Obstetrics and Gynecology

## 2020-02-11 ENCOUNTER — Other Ambulatory Visit: Payer: Self-pay | Admitting: Internal Medicine

## 2020-02-11 DIAGNOSIS — R519 Headache, unspecified: Secondary | ICD-10-CM

## 2020-02-11 DIAGNOSIS — I10 Essential (primary) hypertension: Secondary | ICD-10-CM

## 2020-02-12 NOTE — Telephone Encounter (Signed)
Please advise. Thanks Basha Krygier 

## 2020-02-12 NOTE — Telephone Encounter (Signed)
Please inform patient that unfortunately due to a new medication that she was recently started on (nortryptiline, or Pamelor is the trade name), that she should not take this medication along with the Paroxetine due to increased risk of a condition called serotonin syndrome. The Pamelor alone may suffice to manage her hot flashes. If she notices that the medication does not help with her hot flashes, we can schedule a televisit to discuss other options as her annual exam is not until May.   Dr. Marcelline Mates

## 2020-02-16 ENCOUNTER — Encounter: Payer: Self-pay | Admitting: Family

## 2020-02-18 ENCOUNTER — Encounter: Payer: Self-pay | Admitting: Family

## 2020-02-19 ENCOUNTER — Encounter: Payer: Managed Care, Other (non HMO) | Admitting: Obstetrics and Gynecology

## 2020-02-20 ENCOUNTER — Other Ambulatory Visit: Payer: Self-pay

## 2020-02-20 ENCOUNTER — Encounter: Payer: Self-pay | Admitting: Obstetrics and Gynecology

## 2020-02-20 ENCOUNTER — Ambulatory Visit: Payer: Managed Care, Other (non HMO) | Admitting: Obstetrics and Gynecology

## 2020-02-20 ENCOUNTER — Telehealth: Payer: Self-pay | Admitting: Obstetrics and Gynecology

## 2020-02-20 MED ORDER — PAROXETINE HCL 20 MG PO TABS: 20.0000 mg | ORAL_TABLET | Freq: Every day | ORAL | 3 refills | Status: DC

## 2020-02-20 NOTE — Telephone Encounter (Signed)
Patient initially for televisit, however noted copay was $60.  Changed to telephone encounter due to brevity of contact.  Patient had previously been notified of possible interaction between her medications (Paxil, an SSRI and Pamelor, a TCA) with concerns for the increased risk of serotonin syndrome. Discussed concerns with patient. She reports that she has been on the medications together for several years and has not had any issues. Also notes that since stopping her Paxil due to running out of her prescription she has noticed a difference in her mood symptoms. Patient desires to resume medication. Advised again on risks of use of medication, however has been stable in the past.   There were also concerns about the use of the Paxil with NSAIDs (currently on Mobic) and increased risk of GI bleeds.  Patient does not take medication regularly.    Will refill medication Paxil in light of discussion.    Rubie Maid, MD [facility]

## 2020-02-20 NOTE — Progress Notes (Unsigned)
televisit-pt having visit to discuss medication issues. Pt is currently taking paxil and pamelor together. Pt having visit to discuss what other options do she have.

## 2020-02-26 ENCOUNTER — Other Ambulatory Visit: Payer: Self-pay | Admitting: Family

## 2020-02-26 DIAGNOSIS — I10 Essential (primary) hypertension: Secondary | ICD-10-CM

## 2020-03-03 ENCOUNTER — Other Ambulatory Visit: Payer: Self-pay | Admitting: Internal Medicine

## 2020-03-03 DIAGNOSIS — M62838 Other muscle spasm: Secondary | ICD-10-CM

## 2020-03-04 ENCOUNTER — Other Ambulatory Visit: Payer: Self-pay

## 2020-03-04 ENCOUNTER — Encounter: Payer: Self-pay | Admitting: Family

## 2020-03-04 DIAGNOSIS — R42 Dizziness and giddiness: Secondary | ICD-10-CM

## 2020-03-04 MED ORDER — FEXOFENADINE HCL 180 MG PO TABS: 180.0000 mg | ORAL_TABLET | Freq: Every day | ORAL | 1 refills | Status: DC

## 2020-03-04 NOTE — Telephone Encounter (Signed)
I spoke with patient & she stated that she is established with dermatology, so she would contact them about continuing the doxycycline.

## 2020-03-04 NOTE — Telephone Encounter (Signed)
Call pt  I dont mind refilling doxycycline for acne this month ( please confirm this is why she takes) however I would be more comfortable if she followed up with dermatology in regards to this. I dont have the experience in keeping patients on long term antibiotics for skin conditions. She really does need to involve dermatology here.   I can provide refill today however going forward please ask that she calls dermatology

## 2020-03-06 ENCOUNTER — Encounter: Payer: Self-pay | Admitting: Family

## 2020-03-10 ENCOUNTER — Other Ambulatory Visit: Payer: Self-pay | Admitting: Internal Medicine

## 2020-03-10 DIAGNOSIS — I10 Essential (primary) hypertension: Secondary | ICD-10-CM

## 2020-03-10 DIAGNOSIS — R519 Headache, unspecified: Secondary | ICD-10-CM

## 2020-03-19 ENCOUNTER — Other Ambulatory Visit: Payer: Self-pay

## 2020-03-19 ENCOUNTER — Encounter: Payer: Self-pay | Admitting: Family

## 2020-03-19 ENCOUNTER — Ambulatory Visit (INDEPENDENT_AMBULATORY_CARE_PROVIDER_SITE_OTHER): Payer: Managed Care, Other (non HMO)

## 2020-03-19 ENCOUNTER — Ambulatory Visit: Payer: Managed Care, Other (non HMO) | Admitting: Family

## 2020-03-19 VITALS — BP 123/75 | Ht 62.0 in | Wt 245.2 lb

## 2020-03-19 DIAGNOSIS — M2550 Pain in unspecified joint: Secondary | ICD-10-CM

## 2020-03-19 NOTE — Progress Notes (Signed)
Subjective:    Patient ID: Yvonne [name], female    DOB: 05-Dec-1965, 54 y.o.   MRN: [id number]  CC: Yvonne [name] is a 54 y.o. female who presents today for an acute visit.    HPI: Bilateral Leg pain, described as 'ache, creepy crawling' , constant thorough out the day. Pain has been gradual over past year, feels that the ache 'has spread' over leg over past 8 months. More recently 8 months noticed same creepy crawling over BL arms. Mild pain in neck and low back however doesn't feel 'creepy crawly feeling.' Thinks pain has resolved on prednisone.    Pain 'all over the leg.' No posterior thigh pain, buttucks.  No numbness or tingling.   gabapentin has been tremendously helpful however continues to complain, taking gabapentin 200mg  QID however makes her sleepy.  No trouble having bowel movements, urinating, saddle anesthesia.  Follows with Dr Meda Coffee- RA stable on plaquenil, tyelonol for joint pain.    HISTORY:  Past Medical History:  Diagnosis Date  . Allergy   . Arthritis, rheumatoid ([place])   . GERD (gastroesophageal reflux disease)   . Gestational diabetes    patient denies  . Glaucoma   . H/O bronchitis   . Headache    migraine  . History of ovarian cyst   . Hypertension    pre-eclampsia with first child  . 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 Restless leg    Past Surgical History:  Procedure Laterality Date  . CERVICAL POLYPECTOMY N/A 04/11/2017   Procedure: CERVICAL POLYPECTOMY;  Surgeon: Rubie Maid, MD;  Location: [facility];  Service: Gynecology;  Laterality: N/A;  . HYSTEROSCOPY WITH D & C N/A 04/11/2017   Procedure: DILATATION AND CURETTAGE /HYSTEROSCOPY;  Surgeon: Rubie Maid, MD;  Location: [facility];  Service: Gynecology;  Laterality: N/A;  . IUD REMOVAL N/A 05/05/2017   Procedure: INTRAUTERINE DEVICE (IUD) REMOVAL;  Surgeon: Rubie Maid, MD;  Location: [facility];  Service: Gynecology;  Laterality: N/A;  . LAPAROSCOPIC SALPINGO OOPHERECTOMY Bilateral  05/05/2017   Procedure: LAPAROSCOPIC BILATERAL SALPINGO OOPHORECTOMY;  Surgeon: Rubie Maid, MD;  Location: [facility];  Service: Gynecology;  Laterality: Bilateral;  . LAPAROSCOPY N/A 04/11/2017   Procedure: LAPAROSCOPY DIAGNOSTIC;  Surgeon: Rubie Maid, MD;  Location: [facility];  Service: Gynecology;  Laterality: N/A;  . PARTIAL HYSTERECTOMY     Ovaries and Tubes  . TUBAL LIGATION     Family History  Problem Relation Age of Onset  . Schizophrenia Mother   . Bipolar disorder Mother   . Alcohol abuse Father   . Hypertension Father   . Seizures Paternal Grandmother   . Diabetes Paternal Grandmother   . Hypertension Brother   . Hypertension Maternal Grandmother     Allergies: Flonase [fluticasone propionate], Sulfa antibiotics, and Tussionex pennkinetic er [hydrocod polst-cpm polst er] Current Outpatient Medications on File Prior to Visit  Medication Sig Dispense Refill  . acetaminophen (TYLENOL) 325 MG tablet Take by mouth.    Marland Kitchen amLODipine (NORVASC) 5 MG tablet Take 1 tablet (5 mg total) by mouth daily. 30 tablet 0  . baclofen (LIORESAL) 10 MG tablet Take 1 tablet (10 mg total) by mouth daily as needed for muscle spasms. 30 tablet 0  . doxycycline (PERIOSTAT) 20 MG tablet Take 1 tablet (20 mg total) by mouth daily. 30 tablet 0  . fexofenadine (ALLEGRA ALLERGY) 180 MG tablet Take 1 tablet (180 mg total) by mouth daily. 90 tablet 1  . gabapentin (NEURONTIN) 100 MG  capsule Take 2 capsules (200 mg total) by mouth 3 (three) times daily. 180 capsule 3  . hydroxychloroquine (PLAQUENIL) 200 MG tablet Take 200 mg by mouth 2 (two) times daily.    Marland Kitchen latanoprost (XALATAN) 0.005 % ophthalmic solution Place 1 drop into both eyes at bedtime.    . Melatonin 10 MG TABS Take 10 mg by mouth at bedtime.    . meloxicam (MOBIC) 7.5 MG tablet Take 1 tablet (7.5 mg total) by mouth daily as needed for pain. With food 90 tablet 0  . montelukast (SINGULAIR) 10 MG tablet Take 1 tablet (10 mg total) by mouth at  bedtime. 90 tablet 1  . nortriptyline (PAMELOR) 10 MG capsule Take 1 capsule (10 mg total) by mouth at bedtime. 90 capsule 1  . omeprazole (PRILOSEC) 20 MG capsule Take 1 capsule (20 mg total) by mouth daily. 30 capsule 0  . ondansetron (ZOFRAN ODT) 4 MG disintegrating tablet Take 1 tablet (4 mg total) by mouth every 8 (eight) hours as needed for nausea or vomiting. 30 tablet 1  . PARoxetine (PAXIL) 20 MG tablet Take 1 tablet (20 mg total) by mouth daily. 90 tablet 3  . propranolol (INDERAL) 40 MG tablet Take 1 tablet (40 mg total) by mouth 2 (two) times daily. 60 tablet 0  . timolol (BETIMOL) 0.25 % ophthalmic solution Place 1 drop into both eyes daily.    . SUMAtriptan (IMITREX) 50 MG tablet Take 1 tablet (50 mg total) by mouth once for 1 dose. May repeat in 2 hours if headache persists or recurs. 10 tablet 2   No current facility-administered medications on file prior to visit.    Social History   Tobacco Use  . Smoking status: Former Smoker    Packs/day: 1.00    Years: 6.00    Pack years: 6.00    Types: Cigarettes    Quit date: 05/29/2000    Years since quitting: 19.8  . Smokeless tobacco: Never Used  . Tobacco comment: 1/2-1 PPD  Substance Use Topics  . Alcohol use: No    Alcohol/week: 0.0 standard drinks  . Drug use: No    Review of Systems  Constitutional: Negative for chills and fever.  Respiratory: Negative for cough.   Cardiovascular: Negative for chest pain and palpitations.  Gastrointestinal: Negative for nausea and vomiting.  Musculoskeletal: Positive for arthralgias and back pain.  Neurological: Positive for numbness. Negative for weakness.      Objective:    BP 123/75   Ht 5\' 2"  (1.575 m)   Wt 245 lb 3.2 oz (111.2 kg)   LMP 05/06/2017 (Exact Date)   BMI 44.85 kg/m    Physical Exam Vitals reviewed.  Constitutional:      Appearance: She is well-developed.  Eyes:     Conjunctiva/sclera: Conjunctivae normal.  Cardiovascular:     Rate and Rhythm: Normal  rate and regular rhythm.     Pulses: Normal pulses.     Heart sounds: Normal heart sounds.  Pulmonary:     Effort: Pulmonary effort is normal.     Breath sounds: Normal breath sounds. No wheezing, rhonchi or rales.  Musculoskeletal:     Cervical back: Normal. No swelling, tenderness or bony tenderness. No pain with movement.     Thoracic back: Normal. No tenderness or bony tenderness. Normal range of motion.     Lumbar back: Normal. No swelling, edema, spasms, tenderness or bony tenderness. Normal range of motion. Negative right straight leg raise test and negative left straight  leg raise test.     Comments: Normal grip strength.  No numbness in her arms or legs.  Skin:    General: Skin is warm and dry.  Neurological:     Mental Status: She is alert.     Sensory: No sensory deficit.     Deep Tendon Reflexes:     Reflex Scores:      Patellar reflexes are 2+ on the right side and 2+ on the left side.    Comments: Sensation and strength intact bilateral lower extremities.  Psychiatric:        Speech: Speech normal.        Behavior: Behavior normal.        Thought Content: Thought content normal.        Assessment & Plan:    Problem List Items Addressed This Visit      Other   Arthralgia - Primary    Etiology nonspecific at this time.  Discussed differentials including rheumatoid arthritis, stenosis of the spine,RLS.  Advised to decrease gabapentin due to excessive sedation.  Pending x-rays.  Patient will also discussed with rheumatology.       Relevant Orders   DG Lumbar Spine Complete   DG Cervical Spine Complete   DG Thoracic Spine W/Swimmers       I am having Brandolyn E. Nicole Kindred maintain her latanoprost, timolol, Melatonin, acetaminophen, meloxicam, hydroxychloroquine, montelukast, SUMAtriptan, ondansetron, nortriptyline, gabapentin, PARoxetine, amLODipine, doxycycline, baclofen, fexofenadine, omeprazole, and propranolol.   No orders of the defined types were placed in  this encounter.   Return precautions given.   Risks, benefits, and alternatives of the medications and treatment plan prescribed today were discussed, and patient expressed understanding.   Education regarding symptom management and diagnosis given to patient on AVS.  Continue to follow with Burnard Hawthorne, FNP for routine health maintenance.   [name] and I agreed with plan.   Mable Paris, FNP

## 2020-03-19 NOTE — Assessment & Plan Note (Signed)
Etiology nonspecific at this time.  Discussed differentials including rheumatoid arthritis, stenosis of the spine,RLS.  Advised to decrease gabapentin due to excessive sedation.  Pending x-rays.  Patient will also discussed with rheumatology.

## 2020-03-19 NOTE — Patient Instructions (Addendum)
We will start with x-rays today.  Please reach out to Dr. Meda Coffee in regards to be symptoms also being related to rheumatoid arthritis. Please decrease gabapentin dose due to the sedation.  I would recommend 200 mg 3 times a day versus 4 times a day  Close follow up

## 2020-03-25 ENCOUNTER — Encounter: Payer: Self-pay | Admitting: Family

## 2020-03-25 DIAGNOSIS — I700 Atherosclerosis of aorta: Secondary | ICD-10-CM | POA: Insufficient documentation

## 2020-03-26 ENCOUNTER — Other Ambulatory Visit: Payer: Self-pay | Admitting: Family

## 2020-03-26 ENCOUNTER — Encounter: Payer: Self-pay | Admitting: Family

## 2020-03-26 DIAGNOSIS — M2550 Pain in unspecified joint: Secondary | ICD-10-CM

## 2020-03-26 DIAGNOSIS — I700 Atherosclerosis of aorta: Secondary | ICD-10-CM

## 2020-03-26 MED ORDER — ROSUVASTATIN CALCIUM 5 MG PO TABS: 5.0000 mg | ORAL_TABLET | Freq: Every day | ORAL | 3 refills | Status: DC

## 2020-03-28 ENCOUNTER — Encounter: Payer: Self-pay | Admitting: Family

## 2020-03-31 ENCOUNTER — Ambulatory Visit: Payer: Managed Care, Other (non HMO) | Admitting: Family

## 2020-03-31 NOTE — Progress Notes (Signed)
Pt present for annual exam. Pt c/o of uti symptoms. Pt stated having a lot of cyst and boils throughout her body. Pt also stated that she is unable to control or hold her bladder at time.

## 2020-03-31 NOTE — Patient Instructions (Addendum)
Preventive Care 40-52 Years Old, Female Preventive care refers to visits with your health care provider and lifestyle choices that can promote health and wellness. This includes:  A yearly physical exam. This may also be called an annual well check.  Regular dental visits and eye exams.  Immunizations.  Screening for certain conditions.  Healthy lifestyle choices, such as eating a healthy diet, getting regular exercise, not using drugs or products that contain nicotine and tobacco, and limiting alcohol use. What can I expect for my preventive care visit? Physical exam Your health care provider will check your:  Height and weight. This may be used to calculate body mass index (BMI), which tells if you are at a healthy weight.  Heart rate and blood pressure.  Skin for abnormal spots. Counseling Your health care provider may ask you questions about your:  Alcohol, tobacco, and drug use.  Emotional well-being.  Home and relationship well-being.  Sexual activity.  Eating habits.  Work and work environment.  Method of birth control.  Menstrual cycle.  Pregnancy history. What immunizations do I need?  Influenza (flu) vaccine  This is recommended every year. Tetanus, diphtheria, and pertussis (Tdap) vaccine  You may need a Td booster every 10 years. Varicella (chickenpox) vaccine  You may need this if you have not been vaccinated. Zoster (shingles) vaccine  You may need this after age 52. Measles, mumps, and rubella (MMR) vaccine  You may need at least one dose of MMR if you were born in 1957 or later. You may also need a second dose. Pneumococcal conjugate (PCV13) vaccine  You may need this if you have certain conditions and were not previously vaccinated. Pneumococcal polysaccharide (PPSV23) vaccine  You may need one or two doses if you smoke cigarettes or if you have certain conditions. Meningococcal conjugate (MenACWY) vaccine  You may need this if you  have certain conditions. Hepatitis A vaccine  You may need this if you have certain conditions or if you travel or work in places where you may be exposed to hepatitis A. Hepatitis B vaccine  You may need this if you have certain conditions or if you travel or work in places where you may be exposed to hepatitis B. Haemophilus influenzae type b (Hib) vaccine  You may need this if you have certain conditions. Human papillomavirus (HPV) vaccine  If recommended by your health care provider, you may need three doses over 6 months. You may receive vaccines as individual doses or as more than one vaccine together in one shot (combination vaccines). Talk with your health care provider about the risks and benefits of combination vaccines. What tests do I need? Blood tests  Lipid and cholesterol levels. These may be checked every 5 years, or more frequently if you are over 50 years old.  Hepatitis C test.  Hepatitis B test. Screening  Lung cancer screening. You may have this screening every year starting at age 52 if you have a 30-pack-year history of smoking and currently smoke or have quit within the past 15 years.  Colorectal cancer screening. All adults should have this screening starting at age 50 and continuing until age 75. Your health care provider may recommend screening at age 45 if you are at increased risk. You will have tests every 1-10 years, depending on your results and the type of screening test.  Diabetes screening. This is done by checking your blood sugar (glucose) after you have not eaten for a while (fasting). You may have this   done every 1-3 years.  Mammogram. This may be done every 1-2 years. Talk with your health care provider about when you should start having regular mammograms. This may depend on whether you have a family history of breast cancer.  BRCA-related cancer screening. This may be done if you have a family history of breast, ovarian, tubal, or peritoneal  cancers.  Pelvic exam and Pap test. This may be done every 3 years starting at age 52. Starting at age 7, this may be done every 52 years if you have a Pap test in combination with an HPV test. Other tests  Sexually transmitted disease (STD) testing.  Bone density scan. This is done to screen for osteoporosis. You may have this scan if you are at high risk for osteoporosis. Follow these instructions at home: Eating and drinking  Eat a diet that includes fresh fruits and vegetables, whole grains, lean protein, and low-fat dairy.  Take vitamin and mineral supplements as recommended by your health care provider.  Do not drink alcohol if: ? Your health care provider tells you not to drink. ? You are pregnant, may be pregnant, or are planning to become pregnant.  If you drink alcohol: ? Limit how much you have to 0-1 drink a day. ? Be aware of how much alcohol is in your drink. In the [place] one drink equals one 12 oz bottle of beer (355 mL), one 5 oz glass of wine (148 mL), or one 1 oz glass of hard liquor (44 mL). Lifestyle  Take daily care of your teeth and gums.  Stay active. Exercise for at least 30 minutes on 5 or more days each week.  Do not use any products that contain nicotine or tobacco, such as cigarettes, e-cigarettes, and chewing tobacco. If you need help quitting, ask your health care provider.  If you are sexually active, practice safe sex. Use a condom or other form of birth control (contraception) in order to prevent pregnancy and STIs (sexually transmitted infections).  If told by your health care provider, take low-dose aspirin daily starting at age 52. What's next?  Visit your health care provider once a year for a well check visit.  Ask your health care provider how often you should have your eyes and teeth checked.  Stay up to date on all vaccines. This information is not intended to replace advice given to you by your health care provider. Make sure you  discuss any questions you have with your health care provider. Document Revised: 07/27/2018 Document Reviewed: 07/27/2018 Elsevier Patient Education  2020 [place] Breast self-awareness is knowing how your breasts look and feel. Doing breast self-awareness is important. It allows you to catch a breast problem early while it is still small and can be treated. All women should do breast self-awareness, including women who have had breast implants. Tell your doctor if you notice a change in your breasts. What you need:  A mirror.  A well-lit room. How to do a breast self-exam A breast self-exam is one way to learn what is normal for your breasts and to check for changes. To do a breast self-exam: Look for changes  1. Take off all the clothes above your waist. 2. Stand in front of a mirror in a room with good lighting. 3. Put your hands on your hips. 4. Push your hands down. 5. Look at your breasts and nipples in the mirror to see if one breast or nipple looks different from the  other. Check to see if: ? The shape of one breast is different. ? The size of one breast is different. ? There are wrinkles, dips, and bumps in one breast and not the other. 6. Look at each breast for changes in the skin, such as: ? Redness. ? Scaly areas. 7. Look for changes in your nipples, such as: ? Liquid around the nipples. ? Bleeding. ? [place]. ? Redness. ? A change in where the nipples are. Feel for changes  1. Lie on your back on the floor. 2. Feel each breast. To do this, follow these steps: ? Pick a breast to feel. ? Put the arm closest to that breast above your head. ? Use your other arm to feel the nipple area of your breast. Feel the area with the pads of your three middle fingers by making small circles with your fingers. For the first circle, press lightly. For the second circle, press harder. For the third circle, press even harder. ? Keep making circles with  your fingers at the different pressures as you move down your breast. Stop when you feel your ribs. ? Move your fingers a little toward the center of your body. ? Start making circles with your fingers again, this time going up until you reach your collarbone. ? Keep making up-and-down circles until you reach your armpit. Remember to keep using the three pressures. ? Feel the other breast in the same way. 3. Sit or stand in the tub or shower. 4. With soapy water on your skin, feel each breast the same way you did in step 2 when you were lying on the floor. Write down what you find Writing down what you find can help you remember what to tell your doctor. Write down:  What is normal for each breast.  Any changes you find in each breast, including: ? The kind of changes you find. ? Whether you have pain. ? Size and location of any lumps.  When you last had your menstrual period. General tips  Check your breasts every month.  If you are breastfeeding, the best time to check your breasts is after you feed your baby or after you use a breast pump.  If you get menstrual periods, the best time to check your breasts is 5-7 days after your menstrual period is over.  With time, you will become comfortable with the self-exam, and you will begin to know if there are changes in your breasts. Contact a doctor if you:  See a change in the shape or size of your breasts or nipples.  See a change in the skin of your breast or nipples, such as red or scaly skin.  Have fluid coming from your nipples that is not normal.  Find a lump or thick area that was not there before.  Have pain in your breasts.  Have any concerns about your breast health. Summary  Breast self-awareness includes looking for changes in your breasts, as well as feeling for changes within your breasts.  Breast self-awareness should be done in front of a mirror in a well-lit room.  You should check your breasts every month.  If you get menstrual periods, the best time to check your breasts is 5-7 days after your menstrual period is over.  Let your doctor know of any changes you see in your breasts, including changes in size, changes on the skin, pain or tenderness, or fluid from your nipples that is not normal. This information is not  intended to replace advice given to you by your health care provider. Make sure you discuss any questions you have with your health care provider. Document Revised: 07/04/2018 Document Reviewed: 07/04/2018 Elsevier Patient Education  [place] refers to food and lifestyle choices that are based on the traditions of countries located on the [employer]. This way of eating has been shown to help prevent certain conditions and improve outcomes for people who have chronic diseases, like kidney disease and heart disease. What are tips for following this plan? Lifestyle  Cook and eat meals together with your family, when possible.  Drink enough fluid to keep your urine clear or pale yellow.  Be physically active every day. This includes: ? Aerobic exercise like running or swimming. ? Leisure activities like gardening, walking, or housework.  Get 7-8 hours of sleep each night.  If recommended by your health care provider, drink red wine in moderation. This means 1 glass a day for nonpregnant women and 2 glasses a day for men. A glass of wine equals 5 oz (150 mL). Reading food labels   Check the serving size of packaged foods. For foods such as rice and pasta, the serving size refers to the amount of cooked product, not dry.  Check the total fat in packaged foods. Avoid foods that have saturated fat or trans fats.  Check the ingredients list for added sugars, such as corn syrup. Shopping  At the grocery store, buy most of your food from the areas near the walls of the store. This includes: ? Fresh fruits and vegetables  (produce). ? Grains, beans, nuts, and seeds. Some of these may be available in unpackaged forms or large amounts (in bulk). ? Fresh seafood. ? Poultry and eggs. ? Low-fat dairy products.  Buy whole ingredients instead of prepackaged foods.  Buy fresh fruits and vegetables in-season from local farmers markets.  Buy frozen fruits and vegetables in resealable bags.  If you do not have access to quality fresh seafood, buy precooked frozen shrimp or canned fish, such as tuna, salmon, or sardines.  Buy small amounts of raw or cooked vegetables, salads, or olives from the deli or salad bar at your store.  Stock your pantry so you always have certain foods on hand, such as olive oil, canned tuna, canned tomatoes, rice, pasta, and beans. Cooking  Cook foods with extra-virgin olive oil instead of using butter or other vegetable oils.  Have meat as a side dish, and have vegetables or grains as your main dish. This means having meat in small portions or adding small amounts of meat to foods like pasta or stew.  Use beans or vegetables instead of meat in common dishes like chili or lasagna.  Experiment with different cooking methods. Try roasting or broiling vegetables instead of steaming or sauteing them.  Add frozen vegetables to soups, stews, pasta, or rice.  Add nuts or seeds for added healthy fat at each meal. You can add these to yogurt, salads, or vegetable dishes.  Marinate fish or vegetables using olive oil, lemon juice, garlic, and fresh herbs. Meal planning   Plan to eat 1 vegetarian meal one day each week. Try to work up to 2 vegetarian meals, if possible.  Eat seafood 2 or more times a week.  Have healthy snacks readily available, such as: ? Vegetable sticks with hummus. ? [place] yogurt. ? Fruit and nut trail mix.  Eat balanced meals throughout the  week. This includes: ? Fruit: 2-3 servings a day ? Vegetables: 4-5 servings a day ? Low-fat dairy: 2 servings a  day ? Fish, poultry, or lean meat: 1 serving a day ? Beans and legumes: 2 or more servings a week ? Nuts and seeds: 1-2 servings a day ? Whole grains: 6-8 servings a day ? Extra-virgin olive oil: 3-4 servings a day  Limit red meat and sweets to only a few servings a month What are my food choices?  Mediterranean diet ? Recommended  Grains: Whole-grain pasta. Brown rice. Bulgar wheat. Polenta. Couscous. Whole-wheat bread. [place].  Vegetables: Artichokes. Beets. Broccoli. Cabbage. Carrots. Eggplant. Green beans. Chard. Kale. Spinach. Onions. Leeks. Peas. Squash. Tomatoes. Peppers. Radishes.  Fruits: Apples. Apricots. Avocado. Berries. Bananas. Cherries. Dates. Figs. Grapes. Lemons. Melon. Oranges. Peaches. Plums. Pomegranate.  Meats and other protein foods: Beans. Almonds. Sunflower seeds. Pine nuts. Peanuts. [place]. Salmon. Scallops. Shrimp. [place]. Tilapia. Clams. Oysters. Eggs.  Dairy: Low-fat milk. Cheese. Greek yogurt.  Beverages: Water. Red wine. Herbal tea.  Fats and oils: Extra virgin olive oil. Avocado oil. Grape seed oil.  Sweets and desserts: [place] yogurt with honey. Baked apples. Poached pears. Trail mix.  Seasoning and other foods: Basil. Cilantro. Coriander. Cumin. Mint. Parsley. Sage. Rosemary. Tarragon. Garlic. Oregano. Thyme. Pepper. Balsalmic vinegar. Tahini. Hummus. Tomato sauce. Olives. Mushrooms. ? Limit these  Grains: Prepackaged pasta or rice dishes. Prepackaged cereal with added sugar.  Vegetables: Deep fried potatoes (french fries).  Fruits: Fruit canned in syrup.  Meats and other protein foods: Beef. Pork. Lamb. Poultry with skin. Hot dogs. [place] Salines.  Dairy: Ice cream. Sour cream. Whole milk.  Beverages: Juice. Sugar-sweetened soft drinks. Beer. Liquor and spirits.  Fats and oils: Butter. Canola oil. Vegetable oil. Beef fat (tallow). Lard.  Sweets and desserts: Cookies. Cakes. Pies. Candy.  Seasoning and other foods: Mayonnaise. Premade sauces  and marinades. The items listed may not be a complete list. Talk with your dietitian about what dietary choices are right for you. Summary  The Mediterranean diet includes both food and lifestyle choices.  Eat a variety of fresh fruits and vegetables, beans, nuts, seeds, and whole grains.  Limit the amount of red meat and sweets that you eat.  Talk with your health care provider about whether it is safe for you to drink red wine in moderation. This means 1 glass a day for nonpregnant women and 2 glasses a day for men. A glass of wine equals 5 oz (150 mL). This information is not intended to replace advice given to you by your health care provider. Make sure you discuss any questions you have with your health care provider. Document Revised: 07/15/2016 Document Reviewed: 07/08/2016 Elsevier Patient Education  [place].

## 2020-04-01 ENCOUNTER — Other Ambulatory Visit (HOSPITAL_COMMUNITY)
Admission: RE | Admit: 2020-04-01 | Discharge: 2020-04-01 | Disposition: A | Discharge status: Home / Self Care | Payer: Managed Care, Other (non HMO) | Source: Ambulatory Visit | Attending: Obstetrics and Gynecology | Admitting: Obstetrics and Gynecology

## 2020-04-01 ENCOUNTER — Ambulatory Visit (INDEPENDENT_AMBULATORY_CARE_PROVIDER_SITE_OTHER): Payer: Managed Care, Other (non HMO) | Admitting: Obstetrics and Gynecology

## 2020-04-01 ENCOUNTER — Other Ambulatory Visit: Payer: Self-pay

## 2020-04-01 ENCOUNTER — Other Ambulatory Visit: Payer: Self-pay | Admitting: Internal Medicine

## 2020-04-01 ENCOUNTER — Encounter: Payer: Self-pay | Admitting: Obstetrics and Gynecology

## 2020-04-01 VITALS — BP 130/81 | HR 65 | Ht 62.0 in | Wt 249.3 lb

## 2020-04-01 DIAGNOSIS — R399 Unspecified symptoms and signs involving the genitourinary system: Secondary | ICD-10-CM

## 2020-04-01 DIAGNOSIS — L0232 Furuncle of buttock: Secondary | ICD-10-CM

## 2020-04-01 DIAGNOSIS — Z01419 Encounter for gynecological examination (general) (routine) without abnormal findings: Secondary | ICD-10-CM | POA: Diagnosis not present

## 2020-04-01 DIAGNOSIS — M62838 Other muscle spasm: Secondary | ICD-10-CM

## 2020-04-01 DIAGNOSIS — E8940 Asymptomatic postprocedural ovarian failure: Secondary | ICD-10-CM

## 2020-04-01 DIAGNOSIS — Z1211 Encounter for screening for malignant neoplasm of colon: Secondary | ICD-10-CM

## 2020-04-01 DIAGNOSIS — Z6841 Body Mass Index (BMI) 40.0 and over, adult: Secondary | ICD-10-CM

## 2020-04-01 DIAGNOSIS — E785 Hyperlipidemia, unspecified: Secondary | ICD-10-CM

## 2020-04-01 DIAGNOSIS — Z1231 Encounter for screening mammogram for malignant neoplasm of breast: Secondary | ICD-10-CM

## 2020-04-01 DIAGNOSIS — Z780 Asymptomatic menopausal state: Secondary | ICD-10-CM

## 2020-04-01 LAB — POCT URINALYSIS DIPSTICK OB
Bilirubin, UA: NEGATIVE
Blood, UA: NEGATIVE
Glucose, UA: NEGATIVE
Ketones, UA: NEGATIVE
Leukocytes, UA: NEGATIVE
Nitrite, UA: NEGATIVE
Spec Grav, UA: >=1.03 — AB (ref 1.010–1.025)
Urobilinogen, UA: 0.2 E.U./dL
pH, UA: 6 (ref 5.0–8.0)

## 2020-04-01 MED ORDER — PHENTERMINE HCL 37.5 MG PO CAPS: 37.5000 mg | ORAL_CAPSULE | ORAL | 3 refills | Status: DC

## 2020-04-01 MED ORDER — CLINDAMYCIN PHOSPHATE 1 % EX GEL: Freq: Two times a day (BID) | CUTANEOUS | 4 refills | Status: DC

## 2020-04-01 MED ORDER — PHENTERMINE HCL 30 MG PO CAPS
30.0000 mg | ORAL_CAPSULE | ORAL | 0 refills | Status: DC
Start: 2020-04-01 — End: 2020-07-10

## 2020-04-01 NOTE — Progress Notes (Signed)
ANNUAL PREVENTATIVE CARE GYNECOLOGY  ENCOUNTER NOTE  Subjective:       Yvonne [name] is a 54 y.o. G2P2 female here for a routine annual gynecologic exam. The patient is sexually active. The patient is not taking hormone replacement therapy, but does take Paxil for hot flashes. She denies post-menopausal vaginal bleeding. The patient wears seatbelts: yes. The patient participates in regular exercise: yes.   Current complaints: 1. Urinary symptoms - Pt reports urinary frequency and mild discomfort with urination for the past week. She has been experiencing urgency and occasional leaking ongoing for the past month. Denies vaginal itching or irritation, discharge. Denies pelvic or abdominal pain.   2. Boils - Pt reports she has recurrent painful boils in her groin region. She was previously taking doxycycline which helped and would like to try this again.   3. Weight gain - Pt reports she has been gaining weight since the beginning of the pandemic. She has gained 15 lbs since 04/2019 (with most significant gain over the past 2 months). She states she is trying to incorporate more exercise into her daily life, but struggles with making dietary changes.    Gynecologic History Patient's last menstrual period was 05/06/2017 (exact date). Surgical menopause (bilateral oophorectomy in 2018) Contraception: post menopausal status  Last Pap: 02/2017. Results were: normal Last mammogram: Pt states she has not had one.  Last Colonoscopy: Pt has not had one  Obstetric History OB History  Gravida Para Term Preterm AB Living  2 2       2   SAB TAB Ectopic Multiple Live Births          2    # Outcome Date GA Lbr Len/2nd Weight Sex Delivery Anes PTL Lv  2 Para 1995    F Vag-Spont   LIV  1 Para 1992    M Vag-Spont   LIV    Past Medical History:  Diagnosis Date  . Allergy   . Arthritis, rheumatoid ([place])   . GERD (gastroesophageal reflux disease)   . Gestational diabetes    patient denies  .  Glaucoma   . H/O bronchitis   . Headache    migraine  . History of ovarian cyst   . Hypertension    pre-eclampsia with first child  . 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 Restless leg     Family History  Problem Relation Age of Onset  . Schizophrenia Mother   . Bipolar disorder Mother   . Alcohol abuse Father   . Hypertension Father   . Seizures Paternal Grandmother   . Diabetes Paternal Grandmother   . Hypertension Brother   . Hypertension Maternal Grandmother     Past Surgical History:  Procedure Laterality Date  . CERVICAL POLYPECTOMY N/A 04/11/2017   Procedure: CERVICAL POLYPECTOMY;  Surgeon: Rubie Maid, MD;  Location: [facility];  Service: Gynecology;  Laterality: N/A;  . HYSTEROSCOPY WITH D & C N/A 04/11/2017   Procedure: DILATATION AND CURETTAGE /HYSTEROSCOPY;  Surgeon: Rubie Maid, MD;  Location: [facility];  Service: Gynecology;  Laterality: N/A;  . IUD REMOVAL N/A 05/05/2017   Procedure: INTRAUTERINE DEVICE (IUD) REMOVAL;  Surgeon: Rubie Maid, MD;  Location: [facility];  Service: Gynecology;  Laterality: N/A;  . LAPAROSCOPIC SALPINGO OOPHERECTOMY Bilateral 05/05/2017   Procedure: LAPAROSCOPIC BILATERAL SALPINGO OOPHORECTOMY;  Surgeon: Rubie Maid, MD;  Location: [facility];  Service: Gynecology;  Laterality: Bilateral;  . LAPAROSCOPY N/A 04/11/2017   Procedure: LAPAROSCOPY DIAGNOSTIC;  Surgeon: Rubie Maid,  MD;  Location: [facility];  Service: Gynecology;  Laterality: N/A;  . PARTIAL HYSTERECTOMY     Ovaries and Tubes  . TUBAL LIGATION      Social History   Socioeconomic History  . Marital status: Married    Spouse name: Not on file  . Number of children: Not on file  . Years of education: Not on file  . Highest education level: Not on file  Occupational History  . Occupation: Chemical engineer at [employer]  Tobacco Use  . Smoking status: Former Smoker    Packs/day: 1.00    Years: 6.00    Pack years: 6.00    Types: Cigarettes    Quit  date: 05/29/2000    Years since quitting: 19.8  . Smokeless tobacco: Never Used  . Tobacco comment: 1/2-1 PPD  Substance and Sexual Activity  . Alcohol use: No    Alcohol/week: 0.0 standard drinks  . Drug use: No  . Sexual activity: Yes    Birth control/protection: Surgical  Other Topics Concern  . Not on file  Social History Narrative   Working as Research scientist (physical sciences) at [employer] Strain:   . Difficulty of Paying Living Expenses:   Food Insecurity:   . Worried About Charity fundraiser in the Last Year:   . Arboriculturist in the Last Year:   Transportation Needs:   . Film/video editor (Medical):   Marland Kitchen Lack of Transportation (Non-Medical):   Physical Activity:   . Days of Exercise per Week:   . Minutes of Exercise per Session:   Stress:   . Feeling of Stress :   Social Connections:   . Frequency of Communication with Friends and Family:   . Frequency of Social Gatherings with Friends and Family:   . Attends Religious Services:   . Active Member of Clubs or Organizations:   . Attends Archivist Meetings:   Marland Kitchen Marital Status:   Intimate Partner Violence:   . Fear of Current or Ex-Partner:   . Emotionally Abused:   Marland Kitchen Physically Abused:   . Sexually Abused:     Current Outpatient Medications on File Prior to Visit  Medication Sig Dispense Refill  . acetaminophen (TYLENOL) 325 MG tablet Take by mouth.    Marland Kitchen amLODipine (NORVASC) 5 MG tablet Take 1 tablet (5 mg total) by mouth daily. 30 tablet 0  . baclofen (LIORESAL) 10 MG tablet Take 1 tablet (10 mg total) by mouth daily as needed for muscle spasms. 30 tablet 0  . doxycycline (PERIOSTAT) 20 MG tablet Take 1 tablet (20 mg total) by mouth daily. 30 tablet 0  . fexofenadine (ALLEGRA ALLERGY) 180 MG tablet Take 1 tablet (180 mg total) by mouth daily. 90 tablet 1  . gabapentin (NEURONTIN) 100 MG capsule Take 2 capsules (200 mg total) by mouth 3 (three) times daily.  180 capsule 3  . hydroxychloroquine (PLAQUENIL) 200 MG tablet Take 200 mg by mouth 2 (two) times daily.    Marland Kitchen latanoprost (XALATAN) 0.005 % ophthalmic solution Place 1 drop into both eyes at bedtime.    . Melatonin 5 MG CAPS Take by mouth.    . meloxicam (MOBIC) 7.5 MG tablet Take 1 tablet (7.5 mg total) by mouth daily as needed for pain. With food 90 tablet 0  . montelukast (SINGULAIR) 10 MG tablet Take 1 tablet (10 mg total) by mouth at bedtime. 90 tablet 1  .  nortriptyline (PAMELOR) 10 MG capsule Take 1 capsule (10 mg total) by mouth at bedtime. 90 capsule 1  . omeprazole (PRILOSEC) 20 MG capsule Take 1 capsule (20 mg total) by mouth daily. 30 capsule 0  . ondansetron (ZOFRAN ODT) 4 MG disintegrating tablet Take 1 tablet (4 mg total) by mouth every 8 (eight) hours as needed for nausea or vomiting. 30 tablet 1  . PARoxetine (PAXIL) 20 MG tablet Take 1 tablet (20 mg total) by mouth daily. 90 tablet 3  . propranolol (INDERAL) 40 MG tablet Take 1 tablet (40 mg total) by mouth 2 (two) times daily. 60 tablet 0  . SUMAtriptan (IMITREX) 50 MG tablet Take 1 tablet (50 mg total) by mouth once for 1 dose. May repeat in 2 hours if headache persists or recurs. 10 tablet 2  . timolol (BETIMOL) 0.25 % ophthalmic solution Place 1 drop into both eyes daily.    . rosuvastatin (CRESTOR) 5 MG tablet Take 1 tablet (5 mg total) by mouth daily. (Patient not taking: Reported on 04/01/2020) 90 tablet 3   No current facility-administered medications on file prior to visit.    Allergies  Allergen Reactions  . Flonase [Fluticasone Propionate]     Increases eye pressure   . Sulfa Antibiotics Swelling  . Tussionex Pennkinetic Er [Hydrocod Polst-Cpm Polst Er] Itching    Hyperactive     Review of Systems ROS Review of Systems - General ROS: positive for weight gain. negative for - chills, fatigue, fever, hot flashes, night sweats weight loss Psychological ROS: negative for - anxiety, decreased libido, depression, mood  swings, physical abuse or sexual abuse Ophthalmic ROS: negative for - blurry vision, eye pain or loss of vision ENT ROS: negative for - headaches, hearing change, visual changes or vocal changes Allergy and Immunology ROS: negative for - hives, itchy/watery eyes or seasonal allergies Hematological and Lymphatic ROS: negative for - bleeding problems, bruising, swollen lymph nodes or weight loss Endocrine ROS: negative for - galactorrhea, hair pattern changes, hot flashes, malaise/lethargy, mood swings, palpitations, polydipsia/polyuria, skin changes, temperature intolerance or unexpected weight changes Breast ROS: negative for - new or changing breast lumps or nipple discharge Respiratory ROS: negative for - cough or shortness of breath Cardiovascular ROS: negative for - chest pain, irregular heartbeat, palpitations or shortness of breath Gastrointestinal ROS: no abdominal pain, change in bowel habits, or black or bloody stools Genito-Urinary ROS: Positive for urinary frequency, urgency, mild dysuria. No trouble voiding, or hematuria Musculoskeletal ROS: negative for - joint pain or joint stiffness Neurological ROS: negative for - bowel and bladder control changes Dermatological ROS: negative for rash and skin lesion changes. Positive for boils/acne   Objective:   BP 130/81   Pulse 65   Ht 5\' 2"  (1.575 m)   Wt 113.1 kg   LMP 05/06/2017 (Exact Date)   BMI 45.60 kg/m  CONSTITUTIONAL: Well-developed, well-nourished female in no acute distress. Obese (BMI 45).  PSYCHIATRIC: Normal mood and affect. Normal behavior. Normal judgment and thought content. [place]: Alert and oriented to person, place, and time. Normal muscle tone coordination. No cranial nerve deficit noted. HENT:  Normocephalic, atraumatic, External right and left ear normal. Oropharynx is clear and moist EYES: Conjunctivae and EOM are normal. Pupils are equal, round, and reactive to light. No scleral icterus.  NECK: Normal  range of motion, supple, no masses.  Normal thyroid.  SKIN: Skin is warm and dry. No rash noted. Not diaphoretic. No erythema. No pallor.  CARDIOVASCULAR: Normal heart rate noted, regular  rhythm, no murmur. RESPIRATORY: Clear to auscultation bilaterally. Effort and breath sounds normal, no problems with respiration noted. BREASTS: Symmetric in size. No masses, skin changes, nipple drainage, or lymphadenopathy. ABDOMEN: Soft, normal bowel sounds, no distention noted.  No tenderness, rebound or guarding.  BLADDER: Normal PELVIC:  Bladder: no bladder distension noted  Urethra: normal appearing urethra with no masses, tenderness or lesions  Vulva: normal appearing vulva with no masses, tenderness or lesions  Vagina: normal appearing vagina with normal color and discharge, no lesions  Cervix: normal appearing cervix without discharge or lesions  Uterus: uterus is normal size, shape, consistency and nontender  Adnexa: surgically removed  RV: External Exam NormaI, internal not performed.  MUSCULOSKELETAL: Normal range of motion. No tenderness.  No cyanosis, clubbing, or edema.  2+ distal pulses. LYMPHATIC: No Axillary, Supraclavicular, or Inguinal Adenopathy.  Labs: Lab Results  Component Value Date   WBC 5.0 01/24/2020   HGB 15.3 01/24/2020   HCT 45.0 01/24/2020   MCV 88 01/24/2020   PLT 214 01/24/2020    Lab Results  Component Value Date   CREATININE 0.57 03/05/2019   BUN 23 01/17/2019   NA 141 01/17/2019   K 4.8 01/17/2019   CL 103 01/17/2019   CO2 25 01/17/2019    Lab Results  Component Value Date   ALT 19 03/05/2019   AST 17 03/05/2019   GGT 32 05/03/2018   ALKPHOS 130 (H) 01/17/2019   BILITOT 0.4 01/17/2019    Lab Results  Component Value Date   CHOL 280 (H) 01/24/2020   HDL 54 01/24/2020   LDLCALC 194 (H) 01/24/2020   TRIG 172 (H) 01/24/2020   CHOLHDL 5.2 (H) 01/24/2020    Lab Results  Component Value Date   TSH 1.400 01/24/2020    No results found for:  HGBA1C   Results for orders placed or performed in visit on 04/01/20  POC Urinalysis Dipstick OB  Result Value Ref Range   Color, UA yellow    Clarity, UA clear    Glucose, UA Negative Negative   Bilirubin, UA neg    Ketones, UA neg    Spec Grav, UA >=1.030 (A) 1.010 - 1.025   Blood, UA neg    pH, UA 6.0 5.0 - 8.0   POC,PROTEIN,UA Trace Negative, Trace, Small (1+), Moderate (2+), Large (3+), 4+   Urobilinogen, UA 0.2 0.2 or 1.0 E.U./dL   Nitrite, UA neg    Leukocytes, UA Negative Negative   Appearance yellow;clear    Odor      Assessment:   1. Encounter for well woman exam with routine gynecological exam   2. Breast cancer screening by mammogram   3. UTI symptoms   4. Colon cancer screening   5. Dyslipidemia   6. Boil of buttock   7. Menopause   8. Morbid obesity with BMI of 45.0-49.9, adult ([place])   9. Surgical menopause      Plan:  1. Well woman visit with gynecologic exam   Pap: Pap Co Test performed today. Mammogram: Ordered. Stressed importance of getting mammogram as it has been ordered several times previously.  ColoGuard ordered for colorectal cancer screening.  Labs: Up to date - patient had labs with PCP 11/2019.  Routine preventative health maintenance measures emphasized: Exercise/Diet/Weight control   2. Obesity The patient has been gaining more weight over the past year, with most recent months noting the most dramatic increase. Was on phentermine previously for short time but was unable to remain on the  medication due to cost of frequent follow up visits, also was having difficulty complying with diet and exercise.  If prescribed, patient notes that she will be more compliant this time.  Will prescribe phentermine 37.5 mg. Pt will check in via MyChart messaging with weight and lifestyle changes in 2 months, follow up in clinic in 4 months. Emphasized the importance of lifestyle changes along with medication. Discussed Mediterranean vs keto diet and  increasing regular exercise.  Also discussed possibility of alternative for menopausal symptoms as Paxil can have side effects of weight gain. Patient notes she does not desire to change medication at this time as she has tried several other medications before but had side effects.   3. Boils Prescribed clindamycin topical gel for patient to use as needed for recurrent boils. Also discussed alternative topical therapies such as witch hazel and tea tree oil.   3. Urinary frequency and urgency Urinalysis in clinic with no evidence of infection. Advised on use of Azo or cranberry juice for symptoms. Also discussed that some of her urinary symptoms may be secondary to her weight gain.  RTC if symptoms fail to improve.   4. Surgical menopause Patient doing well on Paxil for management of vasomotor symptoms.    5. Dyslipidemia  Patient newly diagnosed. She states her PCP desires to start her on medication, but she does not desire to take it. She would like to try to lose weight first to see if it will help.    Patient will return to clinic in 4 months for weight management check, or sooner as needed. To check in via Mychart in 2 months.     Zoe Morejon, Student-PA    I have seen and examined the patient with Blair Heys, [place] PA-S.  I have reviewed the record and concur with patient management and plan.   Rubie Maid, MD [facility]

## 2020-04-04 ENCOUNTER — Encounter: Payer: Self-pay | Admitting: Obstetrics and Gynecology

## 2020-04-08 LAB — CYTOLOGY - PAP
Comment: NEGATIVE
Diagnosis: NEGATIVE
High risk HPV: NEGATIVE

## 2020-04-10 ENCOUNTER — Encounter: Payer: Self-pay | Admitting: Family

## 2020-04-14 ENCOUNTER — Other Ambulatory Visit: Payer: Self-pay | Admitting: Family

## 2020-04-14 DIAGNOSIS — M170 Bilateral primary osteoarthritis of knee: Secondary | ICD-10-CM

## 2020-04-15 ENCOUNTER — Other Ambulatory Visit: Payer: Self-pay

## 2020-04-15 ENCOUNTER — Other Ambulatory Visit: Payer: Self-pay | Admitting: Family

## 2020-04-15 DIAGNOSIS — I10 Essential (primary) hypertension: Secondary | ICD-10-CM

## 2020-04-15 DIAGNOSIS — R519 Headache, unspecified: Secondary | ICD-10-CM

## 2020-04-17 ENCOUNTER — Encounter: Payer: Self-pay | Admitting: Family

## 2020-04-18 ENCOUNTER — Encounter: Payer: Self-pay | Admitting: Family

## 2020-04-18 ENCOUNTER — Other Ambulatory Visit: Payer: Managed Care, Other (non HMO)

## 2020-04-18 ENCOUNTER — Other Ambulatory Visit: Payer: Self-pay

## 2020-04-18 DIAGNOSIS — G2581 Restless legs syndrome: Secondary | ICD-10-CM

## 2020-04-18 DIAGNOSIS — M170 Bilateral primary osteoarthritis of knee: Secondary | ICD-10-CM | POA: Diagnosis not present

## 2020-04-18 DIAGNOSIS — J3089 Other allergic rhinitis: Secondary | ICD-10-CM

## 2020-04-18 MED ORDER — GABAPENTIN 100 MG PO CAPS: 200.0000 mg | ORAL_CAPSULE | Freq: Three times a day (TID) | ORAL | 3 refills | Status: DC

## 2020-04-18 MED ORDER — MONTELUKAST SODIUM 10 MG PO TABS: 10.0000 mg | ORAL_TABLET | Freq: Every day | ORAL | 1 refills | Status: DC

## 2020-04-19 LAB — COMPREHENSIVE METABOLIC PANEL
ALT: 20 IU/L (ref 0–32)
AST: 22 IU/L (ref 0–40)
Albumin/Globulin Ratio: 1.7 (ref 1.2–2.2)
Albumin: 4.3 g/dL (ref 3.8–4.9)
Alkaline Phosphatase: 109 IU/L (ref 48–121)
BUN/Creatinine Ratio: 25 — ABNORMAL HIGH (ref 9–23)
BUN: 17 mg/dL (ref 6–24)
Bilirubin Total: 0.5 mg/dL (ref 0.0–1.2)
CO2: 22 mmol/L (ref 20–29)
Calcium: 9.8 mg/dL (ref 8.7–10.2)
Chloride: 101 mmol/L (ref 96–106)
Creatinine, Ser: 0.69 mg/dL (ref 0.57–1.00)
GFR calc Af Amer: 117 mL/min/{1.73_m2} (ref >59)
GFR calc non Af Amer: 101 mL/min/{1.73_m2} (ref >59)
Globulin, Total: 2.5 g/dL (ref 1.5–4.5)
Glucose: 85 mg/dL (ref 65–99)
Potassium: 5 mmol/L (ref 3.5–5.2)
Sodium: 139 mmol/L (ref 134–144)
Total Protein: 6.8 g/dL (ref 6.0–8.5)

## 2020-04-23 ENCOUNTER — Other Ambulatory Visit: Payer: Self-pay

## 2020-04-23 DIAGNOSIS — G8929 Other chronic pain: Secondary | ICD-10-CM

## 2020-04-23 MED ORDER — MELOXICAM 7.5 MG PO TABS: 7.5000 mg | ORAL_TABLET | Freq: Every day | ORAL | 0 refills | Status: DC | PRN

## 2020-04-29 ENCOUNTER — Other Ambulatory Visit: Payer: Self-pay | Admitting: Internal Medicine

## 2020-04-29 DIAGNOSIS — M62838 Other muscle spasm: Secondary | ICD-10-CM

## 2020-05-01 ENCOUNTER — Encounter: Payer: Self-pay | Admitting: Family

## 2020-05-01 NOTE — Telephone Encounter (Signed)
Attempted to call patient to discuss advice request. Left a voicemail to call back.

## 2020-05-01 NOTE — Telephone Encounter (Signed)
Patient returned phone call. Yvonne [name] got out of bed this morning and while reaching for the alarm clock to turn it off, suddenly had back pain. Yvonne [name] reached out to Dr. Lonell Face for her back pain and is checking if her insurance covers an MRI to find out the reason for the sudden back pain.

## 2020-05-02 ENCOUNTER — Other Ambulatory Visit: Payer: Self-pay | Admitting: Nurse Practitioner

## 2020-05-02 DIAGNOSIS — M5442 Lumbago with sciatica, left side: Secondary | ICD-10-CM

## 2020-05-06 ENCOUNTER — Other Ambulatory Visit: Payer: Self-pay | Admitting: Nurse Practitioner

## 2020-05-06 ENCOUNTER — Encounter: Payer: Self-pay | Admitting: Family

## 2020-05-07 ENCOUNTER — Telehealth: Payer: Self-pay | Admitting: Family

## 2020-05-07 ENCOUNTER — Encounter: Payer: Self-pay | Admitting: Internal Medicine

## 2020-05-07 ENCOUNTER — Telehealth (INDEPENDENT_AMBULATORY_CARE_PROVIDER_SITE_OTHER): Payer: Managed Care, Other (non HMO) | Admitting: Internal Medicine

## 2020-05-07 ENCOUNTER — Other Ambulatory Visit: Payer: Self-pay | Admitting: Nurse Practitioner

## 2020-05-07 VITALS — Temp 99.6°F | Ht 62.0 in | Wt 249.3 lb

## 2020-05-07 DIAGNOSIS — M5412 Radiculopathy, cervical region: Secondary | ICD-10-CM

## 2020-05-07 DIAGNOSIS — G43919 Migraine, unspecified, intractable, without status migrainosus: Secondary | ICD-10-CM

## 2020-05-07 DIAGNOSIS — B029 Zoster without complications: Secondary | ICD-10-CM | POA: Diagnosis not present

## 2020-05-07 MED ORDER — VALACYCLOVIR HCL 1 G PO TABS: 1000.0000 mg | ORAL_TABLET | Freq: Three times a day (TID) | ORAL | 0 refills | Status: DC

## 2020-05-07 NOTE — Patient Instructions (Addendum)
You can try over the counter lidocaine pain patches for pain  As needed Tylenol  Shingles  Shingles, which is also known as herpes zoster, is an infection that causes a painful skin rash and fluid-filled blisters. It is caused by a virus. Shingles only develops in people who:  Have had chickenpox.  Have been given a medicine to protect against chickenpox (have been vaccinated). Shingles is rare in this group. What are the causes? Shingles is caused by varicella-zoster virus (VZV). This is the same virus that causes chickenpox. After a person is exposed to VZV, the virus stays in the body in an inactive (dormant) state. Shingles develops if the virus is reactivated. This can happen many years after the first (initial) exposure to VZV. It is not known what causes this virus to be reactivated. What increases the risk? People who have had chickenpox or received the chickenpox vaccine are at risk for shingles. Shingles infection is more common in people who:  Are older than age 78.  Have a weakened disease-fighting system (immune system), such as people with: ? HIV. ? AIDS. ? Cancer.  Are taking medicines that weaken the immune system, such as transplant medicines.  Are experiencing a lot of stress. What are the signs or symptoms? Early symptoms of this condition include itching, tingling, and pain in an area on your skin. Pain may be described as burning, stabbing, or throbbing. A few days or weeks after early symptoms start, a painful red rash appears. The rash is usually on one side of the body and has a band-like or belt-like pattern. The rash eventually turns into fluid-filled blisters that break open, change into scabs, and dry up in about 2-3 weeks. At any time during the infection, you may also develop:  A fever.  Chills.  A headache.  An upset stomach. How is this diagnosed? This condition is diagnosed with a skin exam. Skin or fluid samples may be taken from the blisters  before a diagnosis is made. These samples are examined under a microscope or sent to a lab for testing. How is this treated? The rash may last for several weeks. There is not a specific cure for this condition. Your health care provider will probably prescribe medicines to help you manage pain, recover more quickly, and avoid long-term problems. Medicines may include:  Antiviral drugs.  Anti-inflammatory drugs.  Pain medicines.  Anti-itching medicines (antihistamines). If the area involved is on your face, you may be referred to a specialist, such as an eye doctor (ophthalmologist) or an ear, nose, and throat (ENT) doctor (otolaryngologist) to help you avoid eye problems, chronic pain, or disability. Follow these instructions at home: Medicines  Take over-the-counter and prescription medicines only as told by your health care provider.  Apply an anti-itch cream or numbing cream to the affected area as told by your health care provider. Relieving itching and discomfort   Apply cold, wet cloths (cold compresses) to the area of the rash or blisters as told by your health care provider.  Cool baths can be soothing. Try adding baking soda or dry oatmeal to the water to reduce itching. Do not bathe in hot water. Blister and rash care  Keep your rash covered with a loose bandage (dressing). Wear loose-fitting clothing to help ease the pain of material rubbing against the rash.  Keep your rash and blisters clean by washing the area with mild soap and cool water as told by your health care provider.  Check your rash  every day for signs of infection. Check for: ? More redness, swelling, or pain. ? Fluid or blood. ? Warmth. ? Pus or a bad smell.  Do not scratch your rash or pick at your blisters. To help avoid scratching: ? Keep your fingernails clean and cut short. ? Wear gloves or mittens while you sleep, if scratching is a problem. General instructions  Rest as told by your health  care provider.  Keep all follow-up visits as told by your health care provider. This is important.  Wash your hands often with soap and water. If soap and water are not available, use hand sanitizer. Doing this lowers your chance of getting a bacterial skin infection.  Before your blisters change into scabs, your shingles infection can cause chickenpox in people who have never had it or have never been vaccinated against it. To prevent this from happening, avoid contact with other people, especially: ? Babies. ? Pregnant women. ? Children who have eczema. ? Elderly people who have transplants. ? People who have chronic illnesses, such as cancer or AIDS. Contact a health care provider if:  Your pain is not relieved with prescribed medicines.  Your pain does not get better after the rash heals.  You have signs of infection in the rash area, such as: ? More redness, swelling, or pain around the rash. ? Fluid or blood coming from the rash. ? The rash area feeling warm to the touch. ? Pus or a bad smell coming from the rash. Get help right away if:  The rash is on your face or nose.  You have facial pain, pain around your eye area, or loss of feeling on one side of your face.  You have difficulty seeing.  You have ear pain or have ringing in your ear.  You have a loss of taste.  Your condition gets worse. Summary  Shingles, which is also known as herpes zoster, is an infection that causes a painful skin rash and fluid-filled blisters.  This condition is diagnosed with a skin exam. Skin or fluid samples may be taken from the blisters and examined before the diagnosis is made.  Keep your rash covered with a loose bandage (dressing). Wear loose-fitting clothing to help ease the pain of material rubbing against the rash.  Before your blisters change into scabs, your shingles infection can cause chickenpox in people who have never had it or have never been vaccinated against  it. This information is not intended to replace advice given to you by your health care provider. Make sure you discuss any questions you have with your health care provider. Document Revised: 03/09/2019 Document Reviewed: 07/20/2017 Elsevier Patient Education  2020 [employer].

## 2020-05-07 NOTE — Progress Notes (Signed)
Virtual Visit via Video Note  I connected with Yvonne [name]   on 05/07/20 at 10:10 AM EDT by a video enabled telemedicine application and verified that I am speaking with the correct person using two identifiers.  Location patient: home Location provider:work or home office Persons participating in the virtual visit: patient, provider  I discussed the limitations of evaluation and management by telemedicine and the availability of in person appointments. The patient expressed understanding and agreed to proceed.   HPI: Rash since 05/02/20 red and now blistering and painful with low grade fever under left arm on left breast and left back tried otc hc w/o help. 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, bilateral    Restless leg     Past Surgical History:  Procedure Laterality Date   CERVICAL POLYPECTOMY N/A 04/11/2017   Procedure: CERVICAL POLYPECTOMY;  Surgeon: Rubie Maid, MD;  Location: [facility];  Service: Gynecology;  Laterality: N/A;   HYSTEROSCOPY WITH D & C N/A 04/11/2017   Procedure: DILATATION AND CURETTAGE /HYSTEROSCOPY;  Surgeon: Rubie Maid, MD;  Location: [facility];  Service: Gynecology;  Laterality: N/A;   IUD REMOVAL N/A 05/05/2017   Procedure: INTRAUTERINE DEVICE (IUD) REMOVAL;  Surgeon: Rubie Maid, MD;  Location: [facility];  Service: Gynecology;  Laterality: N/A;   LAPAROSCOPIC SALPINGO OOPHERECTOMY Bilateral 05/05/2017   Procedure: LAPAROSCOPIC BILATERAL SALPINGO  OOPHORECTOMY;  Surgeon: Rubie Maid, MD;  Location: [facility];  Service: Gynecology;  Laterality: Bilateral;   LAPAROSCOPY N/A 04/11/2017   Procedure: LAPAROSCOPY DIAGNOSTIC;  Surgeon: Rubie Maid, MD;  Location: [facility];  Service: Gynecology;  Laterality: N/A;   PARTIAL HYSTERECTOMY     Ovaries and Tubes   TUBAL LIGATION      Family History  Problem Relation Age of Onset   Schizophrenia Mother    Bipolar disorder Mother    Alcohol abuse Father    Hypertension Father    Seizures Paternal Grandmother    Diabetes Paternal Grandmother    Hypertension Brother    Hypertension Maternal Grandmother     SOCIAL HX: lives at home   Current Outpatient Medications:    acetaminophen (TYLENOL) 325 MG tablet, Take by mouth., Disp: , Rfl:    amLODipine (NORVASC) 5 MG tablet, Take 1 tablet (5 mg total) by mouth daily., Disp: 30 tablet, Rfl: 0   baclofen (LIORESAL) 10 MG tablet, Take 1 tablet (10 mg total) by mouth daily as needed for muscle spasms., Disp: 30 tablet, Rfl: 0   clindamycin (CLINDAGEL) 1 % gel, Apply topically 2 (two) times daily., Disp: 30 g, Rfl: 4   fexofenadine (ALLEGRA ALLERGY) 180 MG tablet, Take 1 tablet (180 mg total) by mouth daily., Disp: 90 tablet, Rfl: 1   gabapentin (NEURONTIN) 100 MG capsule, Take 2 capsules (200 mg total) by mouth 3 (three) times daily., Disp: 180 capsule, Rfl: 3   hydroxychloroquine (PLAQUENIL) 200 MG tablet,  Take 200 mg by mouth 2 (two) times daily., Disp: , Rfl:    latanoprost (XALATAN) 0.005 % ophthalmic solution, Place 1 drop into both eyes at bedtime., Disp: , Rfl:    Melatonin 5 MG CAPS, Take by mouth., Disp: , Rfl:    meloxicam (MOBIC) 7.5 MG tablet, Take 1 tablet (7.5 mg total) by mouth daily as needed for pain. With food, Disp: 90 tablet, Rfl: 0   montelukast (SINGULAIR) 10 MG tablet, Take 1 tablet (10 mg total) by mouth at bedtime., Disp: 90 tablet, Rfl: 1   nortriptyline (PAMELOR) 10 MG capsule, Take 1 capsule (10 mg  total) by mouth at bedtime., Disp: 90 capsule, Rfl: 1   omeprazole (PRILOSEC) 20 MG capsule, Take 1 capsule (20 mg total) by mouth daily., Disp: 30 capsule, Rfl: 0   ondansetron (ZOFRAN ODT) 4 MG disintegrating tablet, Take 1 tablet (4 mg total) by mouth every 8 (eight) hours as needed for nausea or vomiting., Disp: 30 tablet, Rfl: 1   PARoxetine (PAXIL) 20 MG tablet, Take 1 tablet (20 mg total) by mouth daily., Disp: 90 tablet, Rfl: 3   phentermine 37.5 MG capsule, Take 1 capsule (37.5 mg total) by mouth every morning., Disp: 30 capsule, Rfl: 3   propranolol (INDERAL) 40 MG tablet, Take 1 tablet (40 mg total) by mouth 2 (two) times daily., Disp: 60 tablet, Rfl: 0   SUMAtriptan (IMITREX) 50 MG tablet, Take 1 tablet (50 mg total) by mouth once for 1 dose. May repeat in 2 hours if headache persists or recurs., Disp: 10 tablet, Rfl: 2   timolol (BETIMOL) 0.25 % ophthalmic solution, Place 1 drop into both eyes daily., Disp: , Rfl:    phentermine 30 MG capsule, Take 1 capsule (30 mg total) by mouth every morning. (Patient not taking: Reported on 05/07/2020), Disp: 14 capsule, Rfl: 0   valACYclovir (VALTREX) 1000 MG tablet, Take 1 tablet (1,000 mg total) by mouth 3 (three) times daily. With food, Disp: 42 tablet, Rfl: 0  EXAM:  VITALS per patient if applicable:  GENERAL: alert, oriented, appears well and in no acute distress  HEENT: atraumatic, conjunttiva clear, no obvious abnormalities on inspection of external nose and ears  NECK: normal movements of the head and neck  LUNGS: on inspection no signs of respiratory distress, breathing rate appears normal, no obvious gross SOB, gasping or wheezing  CV: no obvious cyanosis  MS: moves all visible extremities without noticeable abnormality  PSYCH/NEURO: pleasant and cooperative, no obvious depression or anxiety, speech and thought processing grossly intact  ASSESSMENT AND PLAN:  Discussed the following assessment and plan:  Herpes  zoster without complication - Plan: valACYclovir (VALTREX) 1000 MG tablet tid x 7-14 days  Note for work return 6/14  Try otc lidocaine patches not on the area  Intractable migraine without status migrainosus, unspecified migraine type Prn imitrex  -we discussed possible serious and likely etiologies, options for evaluation and workup, limitations of telemedicine visit vs in person visit, treatment, treatment risks and precautions. Pt prefers to treat via telemedicine empirically rather then risking or undertaking an in person visit at this moment. Patient agrees to seek prompt in person care if worsening, new symptoms arise, or if is not improving with treatment.   I discussed the assessment and treatment plan with the patient. The patient was provided an opportunity to ask questions and all were answered. The patient agreed with the plan and demonstrated an understanding of the instructions.   The patient was advised to  call back or seek an in-person evaluation if the symptoms worsen or if the condition fails to improve as anticipated.  Time spent 20 min Delorise Jackson, MD

## 2020-05-07 NOTE — Telephone Encounter (Signed)
Called patient to set up follow up in 4 to 8 weeks. Patient said she would call office back.

## 2020-05-12 ENCOUNTER — Telehealth: Payer: Self-pay | Admitting: Family

## 2020-05-12 ENCOUNTER — Other Ambulatory Visit: Payer: Self-pay | Admitting: Family

## 2020-05-12 DIAGNOSIS — I10 Essential (primary) hypertension: Secondary | ICD-10-CM

## 2020-05-12 DIAGNOSIS — R519 Headache, unspecified: Secondary | ICD-10-CM

## 2020-05-12 NOTE — Telephone Encounter (Signed)
Patient has shingles on 05/07/2020. Shingles are fading, no pain. Can she still get her two MRI's on Friday, 05/16/2020.

## 2020-05-12 NOTE — Telephone Encounter (Signed)
Patient needs to inform the facility. Most places allow patient to still be seen, but need to thoroughly clean afterward with bleach.Left message for patient to return call back.

## 2020-05-14 ENCOUNTER — Other Ambulatory Visit: Payer: Self-pay | Admitting: Family

## 2020-05-14 ENCOUNTER — Encounter: Payer: Self-pay | Admitting: Family

## 2020-05-14 ENCOUNTER — Telehealth: Payer: Self-pay | Admitting: Family

## 2020-05-14 DIAGNOSIS — F40240 Claustrophobia: Secondary | ICD-10-CM

## 2020-05-14 MED ORDER — HYDROXYZINE HCL 10 MG PO TABS: ORAL_TABLET | ORAL | 0 refills | Status: DC

## 2020-05-14 NOTE — Telephone Encounter (Signed)
Pt would like something called in to help with her nerves for her MRI on 05/16/20

## 2020-05-16 ENCOUNTER — Ambulatory Visit
Admission: RE | Admit: 2020-05-16 | Discharge: 2020-05-16 | Disposition: A | Discharge status: Home / Self Care | Payer: Managed Care, Other (non HMO) | Source: Ambulatory Visit | Attending: Nurse Practitioner | Admitting: Nurse Practitioner

## 2020-05-16 ENCOUNTER — Other Ambulatory Visit: Payer: Self-pay

## 2020-05-16 DIAGNOSIS — G8929 Other chronic pain: Secondary | ICD-10-CM | POA: Insufficient documentation

## 2020-05-16 DIAGNOSIS — M5441 Lumbago with sciatica, right side: Secondary | ICD-10-CM | POA: Insufficient documentation

## 2020-05-16 DIAGNOSIS — M5412 Radiculopathy, cervical region: Secondary | ICD-10-CM | POA: Insufficient documentation

## 2020-05-16 DIAGNOSIS — M5442 Lumbago with sciatica, left side: Secondary | ICD-10-CM | POA: Diagnosis present

## 2020-05-19 ENCOUNTER — Ambulatory Visit: Payer: Managed Care, Other (non HMO) | Admitting: Family

## 2020-05-19 ENCOUNTER — Other Ambulatory Visit: Payer: Self-pay | Admitting: Family

## 2020-05-19 DIAGNOSIS — I10 Essential (primary) hypertension: Secondary | ICD-10-CM

## 2020-05-20 ENCOUNTER — Other Ambulatory Visit: Payer: Self-pay | Admitting: Internal Medicine

## 2020-05-20 DIAGNOSIS — M62838 Other muscle spasm: Secondary | ICD-10-CM

## 2020-05-23 ENCOUNTER — Other Ambulatory Visit: Payer: Self-pay | Admitting: Internal Medicine

## 2020-05-23 DIAGNOSIS — M62838 Other muscle spasm: Secondary | ICD-10-CM

## 2020-05-23 MED ORDER — BACLOFEN 10 MG PO TABS: 10.0000 mg | ORAL_TABLET | Freq: Every day | ORAL | 0 refills | Status: DC | PRN

## 2020-06-18 ENCOUNTER — Telehealth: Payer: Self-pay | Admitting: Family

## 2020-06-18 DIAGNOSIS — J3089 Other allergic rhinitis: Secondary | ICD-10-CM

## 2020-06-18 MED ORDER — MONTELUKAST SODIUM 10 MG PO TABS: 10.0000 mg | ORAL_TABLET | Freq: Every day | ORAL | 1 refills | Status: DC

## 2020-06-18 NOTE — Telephone Encounter (Signed)
Pt called in need refill on montelukast (SINGULAIR) 10 MG tablet she is out of it

## 2020-06-24 ENCOUNTER — Other Ambulatory Visit: Payer: Self-pay | Admitting: Family

## 2020-06-24 DIAGNOSIS — G43809 Other migraine, not intractable, without status migrainosus: Secondary | ICD-10-CM

## 2020-06-24 DIAGNOSIS — R519 Headache, unspecified: Secondary | ICD-10-CM

## 2020-06-24 DIAGNOSIS — M25562 Pain in left knee: Secondary | ICD-10-CM

## 2020-06-24 DIAGNOSIS — I10 Essential (primary) hypertension: Secondary | ICD-10-CM

## 2020-06-24 MED ORDER — MELOXICAM 7.5 MG PO TABS: 7.5000 mg | ORAL_TABLET | Freq: Every day | ORAL | 0 refills | Status: DC | PRN

## 2020-06-24 MED ORDER — OMEPRAZOLE 20 MG PO CPDR: 20.0000 mg | DELAYED_RELEASE_CAPSULE | Freq: Every day | ORAL | 0 refills | Status: DC

## 2020-06-24 MED ORDER — PROPRANOLOL HCL 40 MG PO TABS: 40.0000 mg | ORAL_TABLET | Freq: Two times a day (BID) | ORAL | 0 refills | Status: DC

## 2020-06-26 ENCOUNTER — Other Ambulatory Visit: Payer: Self-pay

## 2020-06-26 MED ORDER — FLUCONAZOLE 150 MG PO TABS
150.0000 mg | ORAL_TABLET | Freq: Once | ORAL | 0 refills | Status: AC
Start: 2020-06-26 — End: 2020-06-26

## 2020-07-02 ENCOUNTER — Ambulatory Visit: Payer: Managed Care, Other (non HMO) | Admitting: Nurse Practitioner

## 2020-07-02 ENCOUNTER — Other Ambulatory Visit: Payer: Self-pay

## 2020-07-02 VITALS — BP 122/64 | HR 62 | Temp 98.1°F | Resp 16 | Ht 63.0 in | Wt 249.0 lb

## 2020-07-02 DIAGNOSIS — N390 Urinary tract infection, site not specified: Secondary | ICD-10-CM

## 2020-07-02 LAB — POCT URINALYSIS DIPSTICK
Bilirubin, UA: NEGATIVE
Blood, UA: NEGATIVE
Glucose, UA: NEGATIVE
Ketones, UA: NEGATIVE
Nitrite, UA: NEGATIVE
Protein, UA: NEGATIVE
Spec Grav, UA: 1.01 (ref 1.010–1.025)
Urobilinogen, UA: NEGATIVE E.U./dL — AB
pH, UA: 6.5 (ref 5.0–8.0)

## 2020-07-02 MED ORDER — FLUCONAZOLE 150 MG PO TABS
150.0000 mg | ORAL_TABLET | Freq: Once | ORAL | 0 refills | Status: AC
Start: 2020-07-02 — End: 2020-07-02

## 2020-07-02 MED ORDER — ONDANSETRON 4 MG PO TBDP: 4.0000 mg | ORAL_TABLET | Freq: Three times a day (TID) | ORAL | 1 refills | Status: DC | PRN

## 2020-07-02 MED ORDER — PHENAZOPYRIDINE HCL 200 MG PO TABS
200.0000 mg | ORAL_TABLET | Freq: Three times a day (TID) | ORAL | 0 refills | Status: DC
Start: 2020-07-02 — End: 2020-07-15

## 2020-07-02 MED ORDER — NORTRIPTYLINE HCL 10 MG PO CAPS: 10.0000 mg | ORAL_CAPSULE | Freq: Every day | ORAL | 1 refills | Status: DC

## 2020-07-02 MED ORDER — CEPHALEXIN 500 MG PO CAPS: 500.0000 mg | ORAL_CAPSULE | Freq: Three times a day (TID) | ORAL | 0 refills | Status: DC

## 2020-07-02 NOTE — Patient Instructions (Signed)
Urinary Tract Infection, Adult A urinary tract infection (UTI) is an infection of any part of the urinary tract. The urinary tract includes:  The kidneys.  The ureters.  The bladder.  The urethra. These organs make, store, and get rid of pee (urine) in the body. What are the causes? This is caused by germs (bacteria) in your genital area. These germs grow and cause swelling (inflammation) of your urinary tract. What increases the risk? You are more likely to develop this condition if:  You have a small, thin tube (catheter) to drain pee.  You cannot control when you pee or poop (incontinence).  You are female, and: ? You use these methods to prevent pregnancy:  A medicine that kills sperm (spermicide).  A device that blocks sperm (diaphragm). ? You have low levels of a female hormone (estrogen). ? You are pregnant.  You have genes that add to your risk.  You are sexually active.  You take antibiotic medicines.  You have trouble peeing because of: ? A prostate that is bigger than normal, if you are female. ? A blockage in the part of your body that drains pee from the bladder (urethra). ? A kidney stone. ? A nerve condition that affects your bladder (neurogenic bladder). ? Not getting enough to drink. ? Not peeing often enough.  You have other conditions, such as: ? Diabetes. ? A weak disease-fighting system (immune system). ? Sickle cell disease. ? Gout. ? Injury of the spine. What are the signs or symptoms? Symptoms of this condition include:  Needing to pee right away (urgently).  Peeing often.  Peeing small amounts often.  Pain or burning when peeing.  Blood in the pee.  Pee that smells bad or not like normal.  Trouble peeing.  Pee that is cloudy.  Fluid coming from the vagina, if you are female.  Pain in the belly or lower back. Other symptoms include:  Throwing up (vomiting).  No urge to eat.  Feeling mixed up (confused).  Being tired  and grouchy (irritable).  A fever.  Watery poop (diarrhea). How is this treated? This condition may be treated with:  Antibiotic medicine.  Other medicines.  Drinking enough water. Follow these instructions at home:  Medicines  Take over-the-counter and prescription medicines only as told by your doctor.  If you were prescribed an antibiotic medicine, take it as told by your doctor. Do not stop taking it even if you start to feel better. General instructions  Make sure you: ? Pee until your bladder is empty. ? Do not hold pee for a long time. ? Empty your bladder after sex. ? Wipe from front to back after pooping if you are a female. Use each tissue one time when you wipe.  Drink enough fluid to keep your pee pale yellow.  Keep all follow-up visits as told by your doctor. This is important. Contact a doctor if:  You do not get better after 1-2 days.  Your symptoms go away and then come back. Get help right away if:  You have very bad back pain.  You have very bad pain in your lower belly.  You have a fever.  You are sick to your stomach (nauseous).  You are throwing up. Summary  A urinary tract infection (UTI) is an infection of any part of the urinary tract.  This condition is caused by germs in your genital area.  There are many risk factors for a UTI. These include having a small, thin   tube to drain pee and not being able to control when you pee or poop.  Treatment includes antibiotic medicines for germs.  Drink enough fluid to keep your pee pale yellow. This information is not intended to replace advice given to you by your health care provider. Make sure you discuss any questions you have with your health care provider. Document Revised: 11/02/2018 Document Reviewed: 05/25/2018 Elsevier Patient Education  [place].  Dysuria Dysuria is pain or discomfort while urinating. The pain or discomfort may be felt in the part of your body that  drains urine from the bladder (urethra) or in the surrounding tissue of the genitals. The pain may also be felt in the groin area, lower abdomen, or lower back. You may have to urinate frequently or have the sudden feeling that you have to urinate (urgency). Dysuria can affect both men and women, but it is more common in women. Dysuria can be caused by many different things, including:  Urinary tract infection.  Kidney stones or bladder stones.  Certain sexually transmitted infections (STIs), such as chlamydia.  Dehydration.  Inflammation of the tissues of the vagina.  Use of certain medicines.  Use of certain soaps or scented products that cause irritation. Follow these instructions at home: General instructions  Watch your condition for any changes.  Urinate often. Avoid holding urine for long periods of time.  After a bowel movement or urination, women should cleanse from front to back, using each tissue only once.  Urinate after sexual intercourse.  Keep all follow-up visits as told by your health care provider. This is important.  If you had any tests done to find the cause of dysuria, it is up to you to get your test results. Ask your health care provider, or the department that is doing the test, when your results will be ready. Eating and drinking   Drink enough fluid to keep your urine pale yellow.  Avoid caffeine, tea, and alcohol. They can irritate the bladder and make dysuria worse. In men, alcohol may irritate the prostate. Medicines  Take over-the-counter and prescription medicines only as told by your health care provider.  If you were prescribed an antibiotic medicine, take it as told by your health care provider. Do not stop taking the antibiotic even if you start to feel better. Contact a health care provider if:  You have a fever.  You develop pain in your back or sides.  You have nausea or vomiting.  You have blood in your urine.  You are not  urinating as often as you usually do. Get help right away if:  Your pain is severe and not relieved with medicines.  You cannot eat or drink without vomiting.  You are confused.  You have a rapid heartbeat while at rest.  You have shaking or chills.  You feel extremely weak. Summary  Dysuria is pain or discomfort while urinating. Many different conditions can lead to dysuria.  If you have dysuria, you may have to urinate frequently or have the sudden feeling that you have to urinate (urgency).  Watch your condition for any changes. Keep all follow-up visits as told by your health care provider.  Make sure that you urinate often and drink enough fluid to keep your urine pale yellow. This information is not intended to replace advice given to you by your health care provider. Make sure you discuss any questions you have with your health care provider. Document Revised: 10/28/2017 Document Reviewed: 09/01/2017 Elsevier  Patient Education  [employer].

## 2020-07-02 NOTE — Progress Notes (Signed)
Subjective:     Patient ID: Yvonne [name], female   DOB: 01/10/1966, 54 y.o.   MRN: [id number]  HPI Yvonne [name] is a 54 y.o. female who presents to the [facility] with c/o UTI symptoms. The employee reports that a week ago she thought she had a yeast infection due to itching and vaginal d/c. She states she used OTC Monistat without relief. She contacted her PCP who prescribed Diflucan. Employee reports that 4 days ago she noted dysuria, frequency and urgency with urination. She started drinking cranberry juice and lemon juice. The symptoms have worsened and now she feels the urge to urinate every 5 minutes but when she goes only goes a small amount. Employee reports being sexually active with her husband of over 35 years. She did discuss with him any possibility of STD and he said no. She declines testing for STD's.   Current Outpatient Medications on File Prior to Visit  Medication Sig Dispense Refill  . acetaminophen (TYLENOL) 325 MG tablet Take by mouth.    Marland Kitchen amLODipine (NORVASC) 5 MG tablet Take 1 tablet (5 mg total) by mouth daily. 90 tablet 1  . baclofen (LIORESAL) 10 MG tablet Take 1 tablet (10 mg total) by mouth daily as needed for muscle spasms. 30 tablet 0  . clindamycin (CLINDAGEL) 1 % gel Apply topically 2 (two) times daily. 30 g 4  . fexofenadine (ALLEGRA ALLERGY) 180 MG tablet Take 1 tablet (180 mg total) by mouth daily. 90 tablet 1  . gabapentin (NEURONTIN) 100 MG capsule Take 2 capsules (200 mg total) by mouth 3 (three) times daily. 180 capsule 3  . hydroxychloroquine (PLAQUENIL) 200 MG tablet Take 200 mg by mouth 2 (two) times daily.    . hydrOXYzine (ATARAX/VISTARIL) 10 MG tablet Take one tablet prior to procedure if needed. 20 tablet 0  . latanoprost (XALATAN) 0.005 % ophthalmic solution Place 1 drop into both eyes at bedtime.    . Melatonin 5 MG CAPS Take by mouth.    . meloxicam (MOBIC) 7.5 MG tablet Take 1 tablet (7.5 mg total) by mouth daily as needed for pain. With  food 90 tablet 0  . montelukast (SINGULAIR) 10 MG tablet Take 1 tablet (10 mg total) by mouth at bedtime. 90 tablet 1  . nortriptyline (PAMELOR) 10 MG capsule Take 1 capsule (10 mg total) by mouth at bedtime. 90 capsule 1  . omeprazole (PRILOSEC) 20 MG capsule Take 1 capsule (20 mg total) by mouth daily. 30 capsule 0  . ondansetron (ZOFRAN ODT) 4 MG disintegrating tablet Take 1 tablet (4 mg total) by mouth every 8 (eight) hours as needed for nausea or vomiting. 30 tablet 1  . PARoxetine (PAXIL) 20 MG tablet Take 1 tablet (20 mg total) by mouth daily. 90 tablet 3  . phentermine 30 MG capsule Take 1 capsule (30 mg total) by mouth every morning. (Patient not taking: Reported on 05/07/2020) 14 capsule 0  . phentermine 37.5 MG capsule Take 1 capsule (37.5 mg total) by mouth every morning. (Patient not taking: Reported on 07/02/2020) 30 capsule 3  . propranolol (INDERAL) 40 MG tablet Take 1 tablet (40 mg total) by mouth 2 (two) times daily. 60 tablet 0  . SUMAtriptan (IMITREX) 50 MG tablet Take 1 tablet (50 mg total) by mouth once for 1 dose. May repeat in 2 hours if headache persists or recurs. 10 tablet 2  . timolol (BETIMOL) 0.25 % ophthalmic solution Place 1 drop into both eyes daily.    Marland Kitchen  valACYclovir (VALTREX) 1000 MG tablet Take 1 tablet (1,000 mg total) by mouth 3 (three) times daily. With food 42 tablet 0   No current facility-administered medications on file prior to visit.   Allergies  Allergen Reactions  . Flonase [Fluticasone Propionate]     Increases eye pressure   . Sulfa Antibiotics Swelling  . Tussionex Pennkinetic Er [Hydrocod Polst-Cpm Polst Er] Itching    Hyperactive      Review of Systems  Genitourinary: Positive for dysuria, frequency, urgency and vaginal discharge.  All other systems reviewed and are negative.      Objective:   Physical Exam Vitals and nursing note reviewed.  Constitutional:      Appearance: Normal appearance.  HENT:     Head: Normocephalic.  Eyes:      Conjunctiva/sclera: Conjunctivae normal.  Cardiovascular:     Rate and Rhythm: Normal rate.  Pulmonary:     Effort: Pulmonary effort is normal.  Abdominal:     Tenderness: There is no abdominal tenderness. There is no right CVA tenderness, left CVA tenderness or guarding.  Musculoskeletal:        General: Normal range of motion.     Cervical back: Neck supple.  Skin:    General: Skin is warm and dry.  Neurological:     Mental Status: She is alert.  Psychiatric:        Mood and Affect: Mood normal.        Assessment:    54 y.o. female here with c/o dysuria, frequency and urgency. Urine with large LE but nitrite negative. Urine sent for culture. Employee declined STI testing/exam.     Plan:    Urine sent for culture. Rx for Keflex, Pyridium and Diflucan while culture pending. Discussed with the employee treatment plan. If culture reflects need for change in antibiotic someone will call her. Employee given opportunity to ask questions. All questions answered and employee voices understanding.

## 2020-07-02 NOTE — Addendum Note (Signed)
Addended by: Elpidio Galea T on: 07/02/2020 08:26 AM   Modules accepted: Orders

## 2020-07-05 LAB — URINE CULTURE

## 2020-07-10 ENCOUNTER — Encounter: Payer: Self-pay | Admitting: Student in an Organized Health Care Education/Training Program

## 2020-07-10 ENCOUNTER — Ambulatory Visit
Payer: Managed Care, Other (non HMO) | Attending: Student in an Organized Health Care Education/Training Program | Admitting: Student in an Organized Health Care Education/Training Program

## 2020-07-10 ENCOUNTER — Other Ambulatory Visit: Payer: Self-pay

## 2020-07-10 ENCOUNTER — Encounter: Payer: Self-pay | Admitting: Family

## 2020-07-10 VITALS — BP 117/72 | HR 62 | Temp 97.7°F | Resp 18 | Ht 62.0 in | Wt 249.0 lb

## 2020-07-10 DIAGNOSIS — M47812 Spondylosis without myelopathy or radiculopathy, cervical region: Secondary | ICD-10-CM | POA: Diagnosis present

## 2020-07-10 DIAGNOSIS — M47816 Spondylosis without myelopathy or radiculopathy, lumbar region: Secondary | ICD-10-CM

## 2020-07-10 DIAGNOSIS — G894 Chronic pain syndrome: Secondary | ICD-10-CM | POA: Insufficient documentation

## 2020-07-10 DIAGNOSIS — M5412 Radiculopathy, cervical region: Secondary | ICD-10-CM | POA: Diagnosis not present

## 2020-07-10 NOTE — Patient Instructions (Addendum)
Consider replacing Paxil with Cymbalta as Cymbalta can provide analgesic benefit too. Discuss with your PCP   Pain Management Discharge Instructions  General Discharge Instructions :  If you need to reach your doctor call: Monday-Friday 8:00 am - 4:00 pm at [phone] or toll free [phone].  After clinic hours [phone] to have operator reach doctor.  Bring all of your medication bottles to all your appointments in the pain clinic.  To cancel or reschedule your appointment with Pain Management please remember to call 24 hours in advance to avoid a fee.  Refer to the educational materials which you have been given on: General Risks, I had my Procedure. Discharge Instructions, Post Sedation.  Post Procedure Instructions:  The drugs you were given will stay in your system until tomorrow, so for the next 24 hours you should not drive, make any legal decisions or drink any alcoholic beverages.  You may eat anything you prefer, but it is better to start with liquids then soups and crackers, and gradually work up to solid foods.  Please notify your doctor immediately if you have any unusual bleeding, trouble breathing or pain that is not related to your normal pain.  Depending on the type of procedure that was done, some parts of your body may feel week and/or numb.  This usually clears up by tonight or the next day.  Walk with the use of an assistive device or accompanied by an adult for the 24 hours.  You may use ice on the affected area for the first 24 hours.  Put ice in a Ziploc bag and cover with a towel and place against area 15 minutes on 15 minutes off.  You may switch to heat after 24 hours.GENERAL RISKS AND COMPLICATIONS  What are the risk, side effects and possible complications? Generally speaking, most procedures are safe.  However, with any procedure there are risks, side effects, and the possibility of complications.  The risks and complications are dependent upon the  sites that are lesioned, or the type of nerve block to be performed.  The closer the procedure is to the spine, the more serious the risks are.  Great care is taken when placing the radio frequency needles, block needles or lesioning probes, but sometimes complications can occur. 1. Infection: Any time there is an injection through the skin, there is a risk of infection.  This is why sterile conditions are used for these blocks.  There are four possible types of infection. 1. Localized skin infection. 2. Central Nervous System Infection-This can be in the form of Meningitis, which can be deadly. 3. Epidural Infections-This can be in the form of an epidural abscess, which can cause pressure inside of the spine, causing compression of the spinal cord with subsequent paralysis. This would require an emergency surgery to decompress, and there are no guarantees that the patient would recover from the paralysis. 4. Discitis-This is an infection of the intervertebral discs.  It occurs in about 1% of discography procedures.  It is difficult to treat and it may lead to surgery.        2. Pain: the needles have to go through skin and soft tissues, will cause soreness.       3. Damage to internal structures:  The nerves to be lesioned may be near blood vessels or    other nerves which can be potentially damaged.       4. Bleeding: Bleeding is more common if the patient is taking blood thinners  such as  aspirin, Coumadin, Ticiid, Plavix, etc., or if he/she have some genetic predisposition  such as hemophilia. Bleeding into the spinal canal can cause compression of the spinal  cord with subsequent paralysis.  This would require an emergency surgery to  decompress and there are no guarantees that the patient would recover from the  paralysis.       5. Pneumothorax:  Puncturing of a lung is a possibility, every time a needle is introduced in  the area of the chest or upper back.  Pneumothorax refers to free air around  the  collapsed lung(s), inside of the thoracic cavity (chest cavity).  Another two possible  complications related to a similar event would include: Hemothorax and Chylothorax.   These are variations of the Pneumothorax, where instead of air around the collapsed  lung(s), you may have blood or chyle, respectively.       6. Spinal headaches: They may occur with any procedures in the area of the spine.       7. Persistent CSF (Cerebro-Spinal Fluid) leakage: This is a rare problem, but may occur  with prolonged intrathecal or epidural catheters either due to the formation of a fistulous  track or a dural tear.       8. Nerve damage: By working so close to the spinal cord, there is always a possibility of  nerve damage, which could be as serious as a permanent spinal cord injury with  paralysis.       9. Death:  Although rare, severe deadly allergic reactions known as "Anaphylactic  reaction" can occur to any of the medications used.      10. Worsening of the symptoms:  We can always make thing worse.  What are the chances of something like this happening? Chances of any of this occuring are extremely low.  By statistics, you have more of a chance of getting killed in a motor vehicle accident: while driving to the hospital than any of the above occurring .  Nevertheless, you should be aware that they are possibilities.  In general, it is similar to taking a shower.  Everybody knows that you can slip, hit your head and get killed.  Does that mean that you should not shower again?  Nevertheless always keep in mind that statistics do not mean anything if you happen to be on the wrong side of them.  Even if a procedure has a 1 (one) in a 1,000,000 (million) chance of going wrong, it you happen to be that one..Also, keep in mind that by statistics, you have more of a chance of having something go wrong when taking medications.  Who should not have this procedure? If you are on a blood thinning medication (e.g.  Coumadin, Plavix, see list of "Blood Thinners"), or if you have an active infection going on, you should not have the procedure.  If you are taking any blood thinners, please inform your physician.  How should I prepare for this procedure?  Do not eat or drink anything at least six hours prior to the procedure.  Bring a driver with you .  It cannot be a taxi.  Come accompanied by an adult that can drive you back, and that is strong enough to help you if your legs get weak or numb from the local anesthetic.  Take all of your medicines the morning of the procedure with just enough water to swallow them.  If you have diabetes, make sure that you are  scheduled to have your procedure done first thing in the morning, whenever possible.  If you have diabetes, take only half of your insulin dose and notify our nurse that you have done so as soon as you arrive at the clinic.  If you are diabetic, but only take blood sugar pills (oral hypoglycemic), then do not take them on the morning of your procedure.  You may take them after you have had the procedure.  Do not take aspirin or any aspirin-containing medications, at least eleven (11) days prior to the procedure.  They may prolong bleeding.  Wear loose fitting clothing that may be easy to take off and that you would not mind if it got stained with Betadine or blood.  Do not wear any jewelry or perfume  Remove any nail coloring.  It will interfere with some of our monitoring equipment.  NOTE: Remember that this is not meant to be interpreted as a complete list of all possible complications.  Unforeseen problems may occur.  BLOOD THINNERS The following drugs contain aspirin or other products, which can cause increased bleeding during surgery and should not be taken for 2 weeks prior to and 1 week after surgery.  If you should need take something for relief of minor pain, you may take acetaminophen which is found in Tylenol,m Datril, Anacin-3 and  Panadol. It is not blood thinner. The products listed below are.  Do not take any of the products listed below in addition to any listed on your instruction sheet.  A.P.C or A.P.C with Codeine Codeine Phosphate Capsules #3 Ibuprofen Ridaura  ABC compound Congesprin Imuran rimadil  Advil Cope Indocin Robaxisal  Alka-Seltzer Effervescent Pain Reliever and Antacid Coricidin or Coricidin-D  Indomethacin Rufen  Alka-Seltzer plus Cold Medicine Cosprin Ketoprofen S-A-C Tablets  Anacin Analgesic Tablets or Capsules Coumadin Korlgesic Salflex  Anacin Extra Strength Analgesic tablets or capsules CP-2 Tablets Lanoril Salicylate  Anaprox Cuprimine Capsules Levenox Salocol  Anexsia-D Dalteparin Magan Salsalate  Anodynos Darvon compound Magnesium Salicylate Sine-off  Ansaid Dasin Capsules Magsal Sodium Salicylate  Anturane Depen Capsules Marnal Soma  APF Arthritis pain formula Dewitt's Pills Measurin Stanback  Argesic Dia-Gesic Meclofenamic Sulfinpyrazone  Arthritis Bayer Timed Release Aspirin Diclofenac Meclomen Sulindac  Arthritis pain formula Anacin Dicumarol Medipren Supac  Analgesic (Safety coated) Arthralgen Diffunasal Mefanamic Suprofen  Arthritis Strength Bufferin Dihydrocodeine Mepro Compound Suprol  Arthropan liquid Dopirydamole Methcarbomol with Aspirin Synalgos  ASA tablets/Enseals Disalcid Micrainin Tagament  Ascriptin Doan's Midol Talwin  Ascriptin A/D Dolene Mobidin Tanderil  Ascriptin Extra Strength Dolobid Moblgesic Ticlid  Ascriptin with Codeine Doloprin or Doloprin with Codeine Momentum Tolectin  Asperbuf Duoprin Mono-gesic Trendar  Aspergum Duradyne Motrin or Motrin IB Triminicin  Aspirin plain, buffered or enteric coated Durasal Myochrisine Trigesic  Aspirin Suppositories Easprin Nalfon Trillsate  Aspirin with Codeine Ecotrin Regular or Extra Strength Naprosyn Uracel  Atromid-S Efficin Naproxen Ursinus  Auranofin Capsules Elmiron Neocylate Vanquish  Axotal Emagrin Norgesic  Verin  Azathioprine Empirin or Empirin with Codeine Normiflo Vitamin E  Azolid Emprazil Nuprin Voltaren  Bayer Aspirin plain, buffered or children's or timed BC Tablets or powders Encaprin Orgaran Warfarin Sodium  Buff-a-Comp Enoxaparin Orudis Zorpin  Buff-a-Comp with Codeine Equegesic Os-Cal-Gesic   Buffaprin Excedrin plain, buffered or Extra Strength Oxalid   Bufferin Arthritis Strength Feldene Oxphenbutazone   Bufferin plain or Extra Strength Feldene Capsules Oxycodone with Aspirin   Bufferin with Codeine Fenoprofen Fenoprofen Pabalate or Pabalate-SF   Buffets II Flogesic Panagesic   Buffinol plain or Extra Strength  Florinal or Florinal with Codeine Panwarfarin   Buf-Tabs Flurbiprofen Penicillamine   Butalbital Compound Four-way cold tablets Penicillin   Butazolidin Fragmin Pepto-Bismol   Carbenicillin Geminisyn Percodan   Carna Arthritis Reliever Geopen Persantine   Carprofen Gold's salt Persistin   Chloramphenicol Goody's Phenylbutazone   Chloromycetin Haltrain Piroxlcam   Clmetidine heparin Plaquenil   Cllnoril Hyco-pap Ponstel   Clofibrate Hydroxy chloroquine Propoxyphen         Before stopping any of these medications, be sure to consult the physician who ordered them.  Some, such as Coumadin (Warfarin) are ordered to prevent or treat serious conditions such as "deep thrombosis", "pumonary embolisms", and other heart problems.  The amount of time that you may need off of the medication may also vary with the medication and the reason for which you were taking it.  If you are taking any of these medications, please make sure you notify your pain physician before you undergo any procedures.         Facet Blocks Patient Information  Description: The facets are joints in the spine between the vertebrae.  Like any joints in the body, facets can become irritated and painful.  Arthritis can also effect the facets.  By injecting steroids and local anesthetic in and around these  joints, we can temporarily block the nerve supply to them.  Steroids act directly on irritated nerves and tissues to reduce selling and inflammation which often leads to decreased pain.  Facet blocks may be done anywhere along the spine from the neck to the low back depending upon the location of your pain.   After numbing the skin with local anesthetic (like Novocaine), a small needle is passed onto the facet joints under x-ray guidance.  You may experience a sensation of pressure while this is being done.  The entire block usually lasts about 15-25 minutes.   Conditions which may be treated by facet blocks:   Low back/buttock pain  Neck/shoulder pain  Certain types of headaches  Preparation for the injection:  1. Do not eat any solid food or dairy products within 8 hours of your appointment. 2. You may drink clear liquid up to 3 hours before appointment.  Clear liquids include water, black coffee, juice or soda.  No milk or cream please. 3. You may take your regular medication, including pain medications, with a sip of water before your appointment.  Diabetics should hold regular insulin (if taken separately) and take 1/2 normal NPH dose the morning of the procedure.  Carry some sugar containing items with you to your appointment. 4. A driver must accompany you and be prepared to drive you home after your procedure. 5. Bring all your current medications with you. 6. An IV may be inserted and sedation may be given at the discretion of the physician. 7. A blood pressure cuff, EKG and other monitors will often be applied during the procedure.  Some patients may need to have extra oxygen administered for a short period. 8. You will be asked to provide medical information, including your allergies and medications, prior to the procedure.  We must know immediately if you are taking blood thinners (like Coumadin/Warfarin) or if you are allergic to IV iodine contrast (dye).  We must know if you could  possible be pregnant.  Possible side-effects:   Bleeding from needle site  Infection (rare, may require surgery)  Nerve injury (rare)  Numbness & tingling (temporary)  Difficulty urinating (rare, temporary)  Spinal headache (a headache worse  with upright posture)  Light-headedness (temporary)  Pain at injection site (serveral days)  Decreased blood pressure (rare, temporary)  Weakness in arm/leg (temporary)  Pressure sensation in back/neck (temporary)   Call if you experience:   Fever/chills associated with headache or increased back/neck pain  Headache worsened by an upright position  New onset, weakness or numbness of an extremity below the injection site  Hives or difficulty breathing (go to the emergency room)  Inflammation or drainage at the injection site(s)  Severe back/neck pain greater than usual  New symptoms which are concerning to you  Please note:  Although the local anesthetic injected can often make your back or neck feel good for several hours after the injection, the pain will likely return. It takes 3-7 days for steroids to work.  You may not notice any pain relief for at least one week.  If effective, we will often do a series of 2-3 injections spaced 3-6 weeks apart to maximally decrease your pain.  After the initial series, you may be a candidate for a more permanent nerve block of the facets.  If you have any questions, please call #[phone]) [facility] Patient Information  Description: The epidural space surrounds the nerves as they exit the spinal cord.  In some patients, the nerves can be compressed and inflamed by a bulging disc or a tight spinal canal (spinal stenosis).  By injecting steroids into the epidural space, we can bring irritated nerves into direct contact with a potentially helpful medication.  These steroids act directly on the irritated nerves and can reduce  swelling and inflammation which often leads to decreased pain.  Epidural steroids may be injected anywhere along the spine and from the neck to the low back depending upon the location of your pain.   After numbing the skin with local anesthetic (like Novocaine), a small needle is passed into the epidural space slowly.  You may experience a sensation of pressure while this is being done.  The entire block usually last less than 10 minutes.  Conditions which may be treated by epidural steroids:   Low back and leg pain  Neck and arm pain  Spinal stenosis  Post-laminectomy syndrome  Herpes zoster (shingles) pain  Pain from compression fractures  Preparation for the injection:  1. Do not eat any solid food or dairy products within 8 hours of your appointment.  2. You may drink clear liquids up to 3 hours before appointment.  Clear liquids include water, black coffee, juice or soda.  No milk or cream please. 3. You may take your regular medication, including pain medications, with a sip of water before your appointment  Diabetics should hold regular insulin (if taken separately) and take 1/2 normal NPH dos the morning of the procedure.  Carry some sugar containing items with you to your appointment. 4. A driver must accompany you and be prepared to drive you home after your procedure.  5. Bring all your current medications with your. 6. An IV may be inserted and sedation may be given at the discretion of the physician.   7. A blood pressure cuff, EKG and other monitors will often be applied during the procedure.  Some patients may need to have extra oxygen administered for a short period. 8. You will be asked to provide medical information, including your allergies, prior to the procedure.  We must know immediately if you are taking blood thinners (like Coumadin/Warfarin)  Or if  you are allergic to IV iodine contrast (dye). We must know if you could possible be pregnant.  Possible  side-effects:  Bleeding from needle site  Infection (rare, may require surgery)  Nerve injury (rare)  Numbness & tingling (temporary)  Difficulty urinating (rare, temporary)  Spinal headache ( a headache worse with upright posture)  Light -headedness (temporary)  Pain at injection site (several days)  Decreased blood pressure (temporary)  Weakness in arm/leg (temporary)  Pressure sensation in back/neck (temporary)  Call if you experience:  Fever/chills associated with headache or increased back/neck pain.  Headache worsened by an upright position.  New onset weakness or numbness of an extremity below the injection site  Hives or difficulty breathing (go to the emergency room)  Inflammation or drainage at the infection site  Severe back/neck pain  Any new symptoms which are concerning to you  Please note:  Although the local anesthetic injected can often make your back or neck feel good for several hours after the injection, the pain will likely return.  It takes 3-7 days for steroids to work in the epidural space.  You may not notice any pain relief for at least that one week.  If effective, we will often do a series of three injections spaced 3-6 weeks apart to maximally decrease your pain.  After the initial series, we generally will wait several months before considering a repeat injection of the same type.  If you have any questions, please call [phone] [facility]

## 2020-07-10 NOTE — Progress Notes (Signed)
Patient: Yvonne [name]  Service Category: E/M  Provider: Gillis Santa, MD  DOB: 10-08-1966  DOS: 07/10/2020  Referring Provider: Lonell Face, NP  MRN: [id number]  Setting: Ambulatory outpatient  PCP: Burnard Hawthorne, FNP  Type: New Patient  Specialty: Interventional Pain Management    Location: Office  Delivery: Face-to-face     Primary Reason(s) for Visit: Encounter for initial evaluation of one or more chronic problems (new to examiner) potentially causing chronic pain, and posing a threat to normal musculoskeletal function. (Level of risk: High) CC: Back Pain (low) and Neck Pain  HPI  Yvonne [name] is a 54 y.o. year old, female patient, who comes for the first time to our practice referred by Lonell Face, NP [place],  [place] [id number], for our initial evaluation of her chronic pain. She has Hayfever; Chronic nonintractable headache; Hypertension; Menopausal hot flushes; Skin lesion; GERD (gastroesophageal reflux disease); Chronic pain of both knees; Right wrist pain; Prediabetes; Screen for colon cancer; Encounter for long-term (current) use of high-risk medication; Non-seasonal allergic rhinitis; Rheumatoid factor positive; Migraines; Primary osteoarthritis of both knees; Weight loss counseling, encounter for; Hair loss; Hyperglycemia; Restless leg; Otitis media; Morbid obesity with body mass index (BMI) of 40.0 or higher ([place]); Arthralgia; Atherosclerosis of aorta ([place]); Cervical radicular pain; Cervical facet joint syndrome; Lumbar spondylosis; and Chronic pain syndrome on their problem list. Today she comes in for evaluation of her Back Pain (low) and Neck Pain  Pain Assessment: Location: Lower, Upper Back Radiating: legs Onset: More than a month ago Duration: Chronic pain Quality: Constant, Aching, Burning, Dull, Penetrating, Stabbing, Shooting, Sharp, Pressure, Tiring, Squeezing, Numbness, Pins and needles Severity: 7 /10 (subjective, self-reported pain score)   Effect on ADL:   Timing: Constant Modifying factors: stretching, walking, medications BP: 117/72  HR: 62  Onset and Duration: Sudden Cause of pain: Unknown Severity: Getting worse, NAS-11 at its worse: 10/10, NAS-11 at its best: 2/10, NAS-11 now: 7/10 and NAS-11 on the average: 5/10 Timing: During activity or exercise, After activity or exercise and After a period of immobility Aggravating Factors: Bending, Climbing, Kneeling, Lifiting, Motion, Prolonged sitting, Prolonged standing, Squatting, Stooping , Twisting, Walking, Walking uphill and Walking downhill Alleviating Factors: Bending, Stretching, Lying down, Medications, Resting, Sitting and Standing Associated Problems: Fatigue, Inability to concentrate, Inability to control bladder (urine), Inability to control bowel, Nausea, Numbness, Spasms, Swelling, Tingling, Pain that wakes patient up and Pain that does not allow patient to sleep Quality of Pain: Aching, Agonizing, Annoying, Burning, Constant, Deep, Dreadful, Dull, Exhausting, Feeling of constriction, Horrible, Nagging, Pressure-like, Sharp, Shooting, Stabbing, Tingling, Tiring and Uncomfortable Previous Examinations or Tests: MRI scan Previous Treatments: Pool exercises  54 year old female who presents with a chief complaint of bilateral neck pain that radiates to bilateral hands in a dermatomal fashion as well as low back pain that radiates into bilateral lower extremities stopping at her proximal posterior lateral thigh.  Patient also has a history of rheumatoid arthritis.  She previously had an episode of shingles.  She is on Plaquenil for RA.  She is on gabapentin 200 mg 3 times.  She is on Paxil for depression.  She tries to do pool exercises.  She is also on nortriptyline 10 mg nightly.  She states that these are for her headaches.  She is on baclofen 10 mg daily for muscle spasms that she experiences in her low back.  She recently had cervical and lumbar MRI performed, results of  which are below.   Meds  Current Outpatient Medications:  .  acetaminophen (TYLENOL) 325 MG tablet, Take by mouth., Disp: , Rfl:  .  amLODipine (NORVASC) 5 MG tablet, Take 1 tablet (5 mg total) by mouth daily., Disp: 90 tablet, Rfl: 1 .  baclofen (LIORESAL) 10 MG tablet, Take 1 tablet (10 mg total) by mouth daily as needed for muscle spasms., Disp: 30 tablet, Rfl: 0 .  clindamycin (CLINDAGEL) 1 % gel, Apply topically 2 (two) times daily., Disp: 30 g, Rfl: 4 .  fexofenadine (ALLEGRA ALLERGY) 180 MG tablet, Take 1 tablet (180 mg total) by mouth daily., Disp: 90 tablet, Rfl: 1 .  gabapentin (NEURONTIN) 100 MG capsule, Take 2 capsules (200 mg total) by mouth 3 (three) times daily., Disp: 180 capsule, Rfl: 3 .  hydroxychloroquine (PLAQUENIL) 200 MG tablet, Take 200 mg by mouth 2 (two) times daily., Disp: , Rfl:  .  latanoprost (XALATAN) 0.005 % ophthalmic solution, Place 1 drop into both eyes at bedtime., Disp: , Rfl:  .  Melatonin 5 MG CAPS, Take by mouth., Disp: , Rfl:  .  meloxicam (MOBIC) 7.5 MG tablet, Take 1 tablet (7.5 mg total) by mouth daily as needed for pain. With food, Disp: 90 tablet, Rfl: 0 .  montelukast (SINGULAIR) 10 MG tablet, Take 1 tablet (10 mg total) by mouth at bedtime., Disp: 90 tablet, Rfl: 1 .  nortriptyline (PAMELOR) 10 MG capsule, Take 1 capsule (10 mg total) by mouth at bedtime., Disp: 90 capsule, Rfl: 1 .  omeprazole (PRILOSEC) 20 MG capsule, Take 1 capsule (20 mg total) by mouth daily., Disp: 30 capsule, Rfl: 0 .  ondansetron (ZOFRAN ODT) 4 MG disintegrating tablet, Take 1 tablet (4 mg total) by mouth every 8 (eight) hours as needed for nausea or vomiting., Disp: 30 tablet, Rfl: 1 .  PARoxetine (PAXIL) 20 MG tablet, Take 1 tablet (20 mg total) by mouth daily., Disp: 90 tablet, Rfl: 3 .  phentermine 37.5 MG capsule, Take 1 capsule (37.5 mg total) by mouth every morning., Disp: 30 capsule, Rfl: 3 .  phentermine 37.5 MG capsule, Take 37.5 mg by mouth every morning.,  Disp: , Rfl:  .  propranolol (INDERAL) 40 MG tablet, Take 1 tablet (40 mg total) by mouth 2 (two) times daily., Disp: 60 tablet, Rfl: 0 .  SUMAtriptan (IMITREX) 50 MG tablet, Take 1 tablet (50 mg total) by mouth once for 1 dose. May repeat in 2 hours if headache persists or recurs., Disp: 10 tablet, Rfl: 2 .  timolol (BETIMOL) 0.25 % ophthalmic solution, Place 1 drop into both eyes daily., Disp: , Rfl:  .  phenazopyridine (PYRIDIUM) 200 MG tablet, Take 1 tablet (200 mg total) by mouth 3 (three) times daily., Disp: 6 tablet, Rfl: 0 .  valACYclovir (VALTREX) 1000 MG tablet, Take 1 tablet (1,000 mg total) by mouth 3 (three) times daily. With food, Disp: 42 tablet, Rfl: 0  Imaging Review  Cervical Imaging: Cervical MR wo contrast: Results for orders placed during the hospital encounter of 05/16/20  MR CERVICAL SPINE WO CONTRAST  Narrative CLINICAL DATA:  Cervical radiculopathy. Paresthesias in both arms. Headaches. Incontinence.  EXAM: MRI CERVICAL SPINE WITHOUT CONTRAST  TECHNIQUE: Multiplanar, multisequence MR imaging of the cervical spine was performed. No intravenous contrast was administered.  COMPARISON:  Cervical spine radiographs 03/19/2020  FINDINGS: Alignment: Mild reversal of the normal cervical lordosis. Trace anterolisthesis of C3 on C4.  Vertebrae: No fracture, suspicious osseous lesion, or significant marrow edema. Moderate disc space narrowing and degenerative endplate changes at D4-2  and C6-7.  Cord: Normal signal.  Posterior Fossa, vertebral arteries, paraspinal tissues: Unremarkable.  Disc levels:  C2-3: Mild disc bulging, uncovertebral spurring, and mild facet arthrosis without significant stenosis.  C3-4: Right eccentric disc bulging, asymmetric right uncovertebral spurring, and severe right facet arthrosis result in severe right neural foraminal stenosis and mild spinal stenosis.  C4-5: Mild disc bulging, asymmetric left uncovertebral spurring,  and severe right and mild-to-moderate left facet arthrosis result in mild spinal stenosis and moderate left neural foraminal stenosis.  C5-6: Broad-based posterior disc osteophyte complex eccentric to the left and mild facet arthrosis result in mild spinal stenosis and moderate right and severe left neural foraminal stenosis with potential bilateral C6 nerve root impingement.  C6-7: Broad-based posterior disc osteophyte complex eccentric to the left results in mild spinal stenosis and mild right and moderate to severe left neural foraminal stenosis with potential left C7 nerve root impingement.  C7-T1: Negative.  IMPRESSION: 1. 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 2. Severe right neural foraminal stenosis at C3-4. 3. Moderate right and severe left neural foraminal stenosis at C5-6. 4. Moderate to severe left neural foraminal stenosis at C6-7.   Electronically Signed By: Logan Bores M.D. On: 05/17/2020 18:50   Narrative CLINICAL DATA:  54 year old with unspecified UPPER EXTREMITY neuropathy.  EXAM: CERVICAL SPINE - COMPLETE 4+ VIEW  COMPARISON:  None.  FINDINGS: C7 and T1 are not optimally imaged on the LATERAL projection. Reversal of the usual cervical lordosis centered at C5. Anatomic posterior alignment. No visible fractures. Severe disc space narrowing and endplate hypertrophic changes at C5-6 and C6-7. Diffuse facet degenerative changes. Normal prevertebral soft tissues. No static evidence of instability.  IMPRESSION: 1. Reversal of the usual cervical lordosis which may be due to positioning and/or spasm. 2. Severe degenerative disc disease and spondylosis at C5-6 and C6-7. 3. Diffuse facet degenerative changes.   Electronically Signed By: Evangeline Dakin M.D. On: 03/20/2020 08:19     Narrative CLINICAL DATA:  54 year old with unspecified UPPER EXTREMITY neuropathy.  EXAM: THORACIC SPINE -  3 VIEWS  COMPARISON:  None.  FINDINGS: Twelve rib-bearing thoracic vertebrae with anatomic alignment. No fractures. Mild to moderate disc space narrowing and endplate hypertrophic changes involving the mid and lower thoracic spine. Slight thoracic levoscoliosis.  IMPRESSION: Mild to moderate degenerative disc disease and spondylosis involving the mid and lower thoracic spine. Slight thoracic levoscoliosis.   Electronically Signed By: Evangeline Dakin M.D. On: 03/20/2020 08:20   Narrative CLINICAL DATA:  Chronic low back pain. Paresthesias in both legs. Incontinence.  EXAM: MRI LUMBAR SPINE WITHOUT CONTRAST  TECHNIQUE: Multiplanar, multisequence MR imaging of the lumbar spine was performed. No intravenous contrast was administered.  COMPARISON:  Lumbar spine radiographs 03/19/2020  FINDINGS: Segmentation:  Standard.  Alignment:  Trace facet mediated anterolisthesis of L4 on L5.  Vertebrae: No fracture, suspicious osseous lesion, or evidence of discitis. Low level facet edema at L4-5.  Conus medullaris and cauda equina: Conus extends to the L1 level. Conus and cauda equina appear normal.  Paraspinal and other soft tissues: Partially visualized 1.1 cm T2 hyperintense focus in the right kidney, likely a cyst.  Disc levels:  Disc desiccation and mild disc space narrowing from L2-3 to L5-S1.  T12-L1: Negative.  L1-2: Slight facet hypertrophy without disc herniation or stenosis.  L2-3: Minimal disc bulging and mild-to-moderate facet and ligamentum flavum hypertrophy without stenosis.  L3-4: Minimal disc bulging and mild-to-moderate facet and ligamentum flavum hypertrophy without  stenosis.  L4-5: Anterolisthesis with minimal disc uncovering, mild-to-moderate ligamentum flavum hypertrophy, and severe facet arthrosis. No stenosis.  L5-S1: Small central disc protrusion and mild facet arthrosis without stenosis.  IMPRESSION: 1. Severe L4-5 facet arthrosis with  trace anterolisthesis. No stenosis. 2. Mild disc degeneration and mild to moderate facet arthrosis elsewhere without stenosis.   Electronically Signed By: Logan Bores M.D. On: 05/17/2020 18:41   DG Lumbar Spine Complete  Narrative CLINICAL DATA:  54 year old presenting with an unspecified lower extremity neuropathy.  EXAM: LUMBAR SPINE - COMPLETE 4+ VIEW  COMPARISON:  None.  FINDINGS: Five non-rib-bearing lumbar vertebrae with anatomic alignment. No visible fractures. Mild disc space narrowing at L2-3 and L5-S1. Diffuse facet degenerative changes. No visible pars defects, though the oblique images are less than optimally positioned. Sacroiliac joints anatomically aligned without visible degenerative changes.  Aortic atherosclerosis without evidence of aneurysm.  IMPRESSION: 1. Mild degenerative disc disease at L2-3 and L5-S1. 2. Diffuse facet degenerative changes.  Aortic Atherosclerosis (ICD10-170.0)   Electronically Signed By: Evangeline Dakin M.D. On: 03/20/2020 08:16             Complexity Note: Imaging results reviewed. Results shared with Ms. Nicole Kindred, using Layman's terms.                         ROS  Cardiovascular: High blood pressure Pulmonary or Respiratory: No reported pulmonary signs or symptoms such as wheezing and difficulty taking a deep full breath (Asthma), difficulty blowing air out (Emphysema), coughing up mucus (Bronchitis), persistent dry cough, or temporary stoppage of breathing during sleep Neurological: No reported neurological signs or symptoms such as seizures, abnormal skin sensations, urinary and/or fecal incontinence, being born with an abnormal open spine and/or a tethered spinal cord Psychological-Psychiatric: No reported psychological or psychiatric signs or symptoms such as difficulty sleeping, anxiety, depression, delusions or hallucinations (schizophrenial), mood swings (bipolar disorders) or suicidal ideations or  attempts Gastrointestinal: Reflux or heatburn Genitourinary: No reported renal or genitourinary signs or symptoms such as difficulty voiding or producing urine, peeing blood, non-functioning kidney, kidney stones, difficulty emptying the bladder, difficulty controlling the flow of urine, or chronic kidney disease Hematological: Brusing easily and Bleeding easily Endocrine: No reported endocrine signs or symptoms such as high or low blood sugar, rapid heart rate due to high thyroid levels, obesity or weight gain due to slow thyroid or thyroid disease Rheumatologic: Joint aches and or swelling due to excess weight (Osteoarthritis) and Rheumatoid arthritis Musculoskeletal: Negative for myasthenia gravis, muscular dystrophy, multiple sclerosis or malignant hyperthermia Work History: Working full time  Allergies  Ms. Felicetti is allergic to flonase [fluticasone propionate], sulfa antibiotics, and tussionex pennkinetic er [employer] er].  Laboratory Chemistry Profile   Renal Lab Results  Component Value Date   BUN 17 04/18/2020   CREATININE 0.69 04/18/2020   BCR 25 (H) 04/18/2020   GFRAA 117 04/18/2020   GFRNONAA 101 04/18/2020   SPECGRAV 1.010 07/02/2020   PHUR 6.5 07/02/2020   PROTEINUR Negative 07/02/2020     Electrolytes Lab Results  Component Value Date   NA 139 04/18/2020   K 5.0 04/18/2020   CL 101 04/18/2020   CALCIUM 9.8 04/18/2020   PHOS 4.1 05/03/2018     Hepatic Lab Results  Component Value Date   AST 22 04/18/2020   ALT 20 04/18/2020   ALBUMIN 4.3 04/18/2020   ALKPHOS 109 04/18/2020   LIPASE 23 12/06/2016     ID Lab Results  Component Value Date   HIV Non Reactive 02/28/2017   [place] Not Detected 10/20/2019   PREGTESTUR NEGATIVE 05/05/2017     Bone Lab Results  Component Value Date   VD25OH 35.8 12/29/2017     Endocrine Lab Results  Component Value Date   GLUCOSE 85 04/18/2020   GLUCOSEU Negative 04/01/2020   TSH 1.400  01/24/2020     Neuropathy Lab Results  Component Value Date   VITAMINB12 427 01/24/2020   FOLATE 9.9 01/24/2020   HIV Non Reactive 02/28/2017     CNS No results found for: COLORCSF, APPEARCSF, RBCCOUNTCSF, WBCCSF, POLYSCSF, LYMPHSCSF, EOSCSF, PROTEINCSF, GLUCCSF, JCVIRUS, CSFOLI, IGGCSF, LABACHR, ACETBL, LABACHR, ACETBL   Inflammation (CRP: Acute  ESR: Chronic) Lab Results  Component Value Date   CRP 5 03/05/2019   ESRSEDRATE 47 (H) 03/05/2019     Rheumatology Lab Results  Component Value Date   RF 59.1 (H) 12/11/2018   LABURIC 4.7 05/03/2018     Coagulation Lab Results  Component Value Date   PLT 214 01/24/2020     Cardiovascular Lab Results  Component Value Date   HGB 15.3 01/24/2020   HCT 45.0 01/24/2020     Screening Lab Results  Component Value Date   SARSCOV2NAA Not Detected 10/20/2019   HIV Non Reactive 02/28/2017   PREGTESTUR NEGATIVE 05/05/2017     Cancer No results found for: CEA, CA125, LABCA2   Allergens No results found for: ALMOND, APPLE, ASPARAGUS, AVOCADO, BANANA, BARLEY, BASIL, BAYLEAF, GREENBEAN, LIMABEAN, WHITEBEAN, BEEFIGE, REDBEET, BLUEBERRY, BROCCOLI, CABBAGE, MELON, CARROT, CASEIN, CASHEWNUT, CAULIFLOWER, CELERY     Note: Lab results reviewed.  [place]  Drug: Ms. Ballard  reports no history of drug use. Alcohol:  reports no history of alcohol use. Tobacco:  reports that she quit smoking about 20 years ago. Her smoking use included cigarettes. She has a 6.00 pack-year smoking history. She has never used smokeless tobacco. Medical:  has a past medical history of Allergy, Arthritis, rheumatoid ([place]), GERD (gastroesophageal reflux disease), Gestational diabetes, Glaucoma, H/O bronchitis, Headache, History of ovarian cyst, Hypertension, Increased pressure in the eye, bilateral, and Restless leg. Family: family history includes Alcohol abuse in her father; Bipolar disorder in her mother; Diabetes in her paternal grandmother; Hypertension in her  brother, father, and maternal grandmother; Schizophrenia in her mother; Seizures in her paternal grandmother.  Past Surgical History:  Procedure Laterality Date  . CERVICAL POLYPECTOMY N/A 04/11/2017   Procedure: CERVICAL POLYPECTOMY;  Surgeon: Rubie Maid, MD;  Location: [facility];  Service: Gynecology;  Laterality: N/A;  . HYSTEROSCOPY WITH D & C N/A 04/11/2017   Procedure: DILATATION AND CURETTAGE /HYSTEROSCOPY;  Surgeon: Rubie Maid, MD;  Location: [facility];  Service: Gynecology;  Laterality: N/A;  . IUD REMOVAL N/A 05/05/2017   Procedure: INTRAUTERINE DEVICE (IUD) REMOVAL;  Surgeon: Rubie Maid, MD;  Location: [facility];  Service: Gynecology;  Laterality: N/A;  . LAPAROSCOPIC SALPINGO OOPHERECTOMY Bilateral 05/05/2017   Procedure: LAPAROSCOPIC BILATERAL SALPINGO OOPHORECTOMY;  Surgeon: Rubie Maid, MD;  Location: [facility];  Service: Gynecology;  Laterality: Bilateral;  . LAPAROSCOPY N/A 04/11/2017   Procedure: LAPAROSCOPY DIAGNOSTIC;  Surgeon: Rubie Maid, MD;  Location: [facility];  Service: Gynecology;  Laterality: N/A;  . PARTIAL HYSTERECTOMY     Ovaries and Tubes  . TUBAL LIGATION     Active Ambulatory Problems    Diagnosis Date Noted  . Hayfever 02/08/2018  . Chronic nonintractable headache 02/08/2018  . Hypertension 02/08/2018  . Menopausal hot flushes 02/08/2018  . Skin lesion 03/01/2018  .  GERD (gastroesophageal reflux disease) 03/31/2018  . Chronic pain of both knees 11/17/2018  . Right wrist pain 11/17/2018  . Prediabetes 12/15/2018  . Screen for colon cancer 01/19/2019  . Encounter for long-term (current) use of high-risk medication 01/19/2019  . Non-seasonal allergic rhinitis 01/19/2019  . Rheumatoid factor positive 01/17/2019  . Migraines 03/29/2019  . Primary osteoarthritis of both knees 01/26/2019  . Weight loss counseling, encounter for 10/28/2019  . Hair loss 10/28/2019  . Hyperglycemia 10/28/2019  . Restless leg 01/01/2020  . Otitis media 01/11/2020  .  Morbid obesity with body mass index (BMI) of 40.0 or higher ([place]) 04/29/2019  . Arthralgia 03/19/2020  . Atherosclerosis of aorta ([place]) 03/25/2020  . Cervical radicular pain 07/10/2020  . Cervical facet joint syndrome 07/10/2020  . Lumbar spondylosis 07/10/2020  . Chronic pain syndrome 07/10/2020   Resolved Ambulatory Problems    Diagnosis Date Noted  . Non-recurrent acute serous otitis media of right ear 03/01/2018  . Weight loss advised 10/28/2019   Past Medical History:  Diagnosis Date  . Allergy   . Arthritis, rheumatoid ([place])   . Gestational diabetes   . Glaucoma   . H/O bronchitis   . Headache   . History of ovarian cyst   . Increased pressure in the eye, bilateral    Constitutional Exam  General appearance: Well nourished, well developed, and well hydrated. In no apparent acute distress Vitals:   07/10/20 1059  BP: 117/72  Pulse: 62  Resp: 18  Temp: 97.7 F (36.5 C)  TempSrc: Temporal  SpO2: 100%  Weight: 249 lb (112.9 kg)  Height: 5' 2"  (1.575 m)   BMI Assessment: Estimated body mass index is 45.54 kg/m as calculated from the following:   Height as of this encounter: 5' 2"  (1.575 m).   Weight as of this encounter: 249 lb (112.9 kg).  BMI interpretation table: BMI level Category Range association with higher incidence of chronic pain  <18 kg/m2 Underweight   18.5-24.9 kg/m2 Ideal body weight   25-29.9 kg/m2 Overweight Increased incidence by 20%  30-34.9 kg/m2 Obese (Class I) Increased incidence by 68%  35-39.9 kg/m2 Severe obesity (Class II) Increased incidence by 136%  >40 kg/m2 Extreme obesity (Class III) Increased incidence by 254%   Patient's current BMI Ideal Body weight  Body mass index is 45.54 kg/m. Ideal body weight: 50.1 kg (110 lb 7.2 oz) Adjusted ideal body weight: 75.2 kg (165 lb 13.9 oz)   BMI Readings from Last 4 Encounters:  07/10/20 45.54 kg/m  07/02/20 44.11 kg/m  05/07/20 45.60 kg/m  04/01/20 45.60 kg/m   Wt Readings from  Last 4 Encounters:  07/10/20 249 lb (112.9 kg)  07/02/20 249 lb (112.9 kg)  05/07/20 249 lb 4.8 oz (113.1 kg)  04/01/20 249 lb 4.8 oz (113.1 kg)    Psych/Mental status: Alert, oriented x 3 (person, place, & time)       Eyes: PERLA Respiratory: No evidence of acute respiratory distress  Cervical Spine Exam  Skin & Axial Inspection: No masses, redness, edema, swelling, or associated skin lesions Alignment: Symmetrical Functional ROM: Pain restricted ROM      Stability: No instability detected Muscle Tone/Strength: Functionally intact. No obvious neuro-muscular anomalies detected. Sensory (Neurological): Dermatomal pain pattern Palpation: No palpable anomalies              Upper Extremity (UE) Exam    Side: Right upper extremity  Side: Left upper extremity  Skin & Extremity Inspection: Skin color, temperature, and hair growth  are WNL. No peripheral edema or cyanosis. No masses, redness, swelling, asymmetry, or associated skin lesions. No contractures.  Skin & Extremity Inspection: Skin color, temperature, and hair growth are WNL. No peripheral edema or cyanosis. No masses, redness, swelling, asymmetry, or associated skin lesions. No contractures.  Functional ROM: Unrestricted ROM          Functional ROM: Unrestricted ROM          Muscle Tone/Strength: Functionally intact. No obvious neuro-muscular anomalies detected.   Muscle Tone/Strength: Functionally intact. No obvious neuro-muscular anomalies detected.  Sensory (Neurological): Neurogenic pain pattern          Sensory (Neurological): Neurogenic pain pattern          Palpation: No palpable anomalies              Palpation: No palpable anomalies              Provocative Test(s):  Phalen's test: deferred Tinel's test: deferred Apley's scratch test (touch opposite shoulder):  Action 1 (Across chest): deferred Action 2 (Overhead): deferred Action 3 (LB reach): deferred   Provocative Test(s):  Phalen's test: deferred Tinel's test:  deferred Apley's scratch test (touch opposite shoulder):  Action 1 (Across chest): deferred Action 2 (Overhead): deferred Action 3 (LB reach): deferred    Thoracic Spine Area Exam  Skin & Axial Inspection: No masses, redness, or swelling Alignment: Symmetrical Functional ROM: Unrestricted ROM Stability: No instability detected Muscle Tone/Strength: Functionally intact. No obvious neuro-muscular anomalies detected. Sensory (Neurological): Unimpaired Muscle strength & Tone: No palpable anomalies  Lumbar Exam  Skin & Axial Inspection: No masses, redness, or swelling Alignment: Symmetrical Functional ROM: Pain restricted ROM       Stability: No instability detected Muscle Tone/Strength: Functionally intact. No obvious neuro-muscular anomalies detected. Sensory (Neurological): Musculoskeletal pain pattern Palpation: No palpable anomalies       Provocative Tests: Hyperextension/rotation test: (+) bilaterally for facet joint pain. Lumbar quadrant test (Kemp's test): deferred today       Lateral bending test: deferred today       Patrick's Maneuver: deferred today                   FABER* test: deferred today                   S-I anterior distraction/compression test: deferred today         S-I lateral compression test: deferred today         S-I Thigh-thrust test: deferred today         S-I Gaenslen's test: deferred today         *(Flexion, ABduction and External Rotation)  Gait & Posture Assessment  Ambulation: Unassisted Gait: Relatively normal for age and body habitus Posture: WNL   Lower Extremity Exam    Side: Right lower extremity  Side: Left lower extremity  Stability: No instability observed          Stability: No instability observed          Skin & Extremity Inspection: Skin color, temperature, and hair growth are WNL. No peripheral edema or cyanosis. No masses, redness, swelling, asymmetry, or associated skin lesions. No contractures.  Skin & Extremity Inspection:  Skin color, temperature, and hair growth are WNL. No peripheral edema or cyanosis. No masses, redness, swelling, asymmetry, or associated skin lesions. No contractures.  Functional ROM: Unrestricted ROM                  Functional  ROM: Unrestricted ROM                  Muscle Tone/Strength: Functionally intact. No obvious neuro-muscular anomalies detected.  Muscle Tone/Strength: Functionally intact. No obvious neuro-muscular anomalies detected.  Sensory (Neurological): Unimpaired        Sensory (Neurological): Unimpaired        DTR: Patellar: deferred today Achilles: deferred today Plantar: deferred today  DTR: Patellar: deferred today Achilles: deferred today Plantar: deferred today  Palpation: No palpable anomalies  Palpation: No palpable anomalies   Assessment  Primary Diagnosis & Pertinent Problem List: The primary encounter diagnosis was Cervical radicular pain. Diagnoses of Cervical facet joint syndrome, Lumbar facet arthropathy, Lumbar spondylosis, and Chronic pain syndrome were also pertinent to this visit.  Visit Diagnosis (New problems to examiner): 1. Cervical radicular pain   2. Cervical facet joint syndrome   3. Lumbar facet arthropathy   4. Lumbar spondylosis   5. Chronic pain syndrome    Plan of Care (Initial workup plan)   1.  Cervical radicular pain: Symptoms and imaging consistent with cervical radiculopathy.  Cervical MRI performed 05/16/2020 shows multilevel cervical disc and facet degenerative changes and mild spinal canal stenosis from C3-C4 to C6-C7 along with severe right neuroforaminal stenosis at C3-C4, moderate right and severe left neuroforaminal stenosis at C5-C6 and severe left neuroforaminal stenosis at C6-C7.  Recommend cervical epidural steroid injection.  Patient states that she is not interested at this time but will think about it further.  As needed order placed.  2. Lumbar facet arthropathy: Georgie E Olesky has a history of greater than 3 months of  moderate to severe pain which is resulted in functional impairment.  The patient has tried various conservative therapeutic options such as NSAIDs, Tylenol, muscle relaxants, physical therapy which was inadequately effective.  Patient's pain is predominantly axial with physical exam findings suggestive of facet arthropathy.  Lumbar facet medial branch nerve blocks were discussed with the patient.  Risks and benefits were reviewed. Patient will think about it. PRN order placed.    Procedure Orders     Cervical Epidural Injection     LUMBAR FACET(MEDIAL BRANCH NERVE BLOCK) MBNB   Provider-requested follow-up: Return if symptoms worsen or fail to improve.  Future Appointments  Date Time Provider [place]  08/05/2020  4:15 PM Rubie Maid, MD [facility] None    Note by: Gillis Santa, MD Date: 07/10/2020; Time: 12:02 PM

## 2020-07-10 NOTE — Progress Notes (Signed)
Safety precautions to be maintained throughout the outpatient stay will include: orient to surroundings, keep bed in low position, maintain call bell within reach at all times, provide assistance with transfer out of bed and ambulation.  

## 2020-07-15 ENCOUNTER — Telehealth (INDEPENDENT_AMBULATORY_CARE_PROVIDER_SITE_OTHER): Payer: Managed Care, Other (non HMO) | Admitting: Nurse Practitioner

## 2020-07-15 VITALS — Ht 62.0 in | Wt 249.0 lb

## 2020-07-15 DIAGNOSIS — M5412 Radiculopathy, cervical region: Secondary | ICD-10-CM | POA: Diagnosis not present

## 2020-07-15 DIAGNOSIS — M47816 Spondylosis without myelopathy or radiculopathy, lumbar region: Secondary | ICD-10-CM

## 2020-07-15 DIAGNOSIS — M47812 Spondylosis without myelopathy or radiculopathy, cervical region: Secondary | ICD-10-CM

## 2020-07-15 DIAGNOSIS — G894 Chronic pain syndrome: Secondary | ICD-10-CM | POA: Diagnosis not present

## 2020-07-15 NOTE — Progress Notes (Signed)
Virtual Visit via Virtual Note  This visit type was conducted due to national recommendations for restrictions regarding the COVID-19 pandemic (e.g. social distancing).  This format is felt to be most appropriate for this patient at this time.  All issues noted in this document were discussed and addressed.  No physical exam was performed (except for noted visual exam findings with Video Visits).   I connected with@ on 07/16/20 at  9:30 AM EDT by a video enabled telemedicine application or telephone and verified that I am speaking with the correct person using two identifiers. Location patient: home Location provider: work or home office Persons participating in the virtual visit: patient, provider  I discussed the limitations, risks, security and privacy concerns of performing an evaluation and management service by telephone and the availability of in person appointments. I also discussed with the patient that there may be a patient responsible charge related to this service. The patient expressed understanding and agreed to proceed.   Reason for visit: Seen in pain management for back and neck pain.  Taking gabapentin 3 times a day but it is not helping enough.  She wants to know what else she can do for her pain.  HPI: This 54 year old with history of morbid obesity, chronic pain syndrome, restless leg syndrome, chronic low cervical and lower back pain  was referred to Dr. Rutherford Limerick at [facility] for back pain. She was then referred to Dr. Holley Raring for pain management.  Dr. Holley Raring recommends an epidural injection in her neck and back. The patient declined  these. She does not want injections in her back. He recommended Cymbalta. She would like to talk to her primary care provider about this.  The patient reports she is sick of hurting and wants to sleep.  Her lower back pain is causing her to wake up in the middle of the night with tingling, achy feeling in her legs.  The pain goes down to her calf, not all  the way to the feet.  This is bilateral.  Gabapentin has helped tremendously at first, but it is not lasting as long as it used to.  She is considering the Cymbalta offered but worries about side effects.  Patient takes Paxil for menopause symptoms and  has no problems stopping it if needed.  She is on nortriptyline 10 mg at bedtime for migraine headache prevention.  Patient is unable to afford physical therapy so she is going to the pool regularly.    ROS: See pertinent positives and negatives per HPI.  Past Medical History:  Diagnosis Date  . Allergy   . Arthritis, rheumatoid ([place])   . GERD (gastroesophageal reflux disease)   . Gestational diabetes    patient denies  . Glaucoma   . H/O bronchitis   . Headache    migraine  . History of ovarian cyst   . Hypertension    pre-eclampsia with first child  . 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 Restless leg     Past Surgical History:  Procedure Laterality Date  . CERVICAL POLYPECTOMY N/A 04/11/2017   Procedure: CERVICAL POLYPECTOMY;  Surgeon: Rubie Maid, MD;  Location: [facility];  Service: Gynecology;  Laterality: N/A;  . HYSTEROSCOPY WITH D & C N/A 04/11/2017   Procedure: DILATATION AND CURETTAGE /HYSTEROSCOPY;  Surgeon: Rubie Maid, MD;  Location: [facility];  Service: Gynecology;  Laterality: N/A;  . IUD REMOVAL N/A 05/05/2017   Procedure: INTRAUTERINE DEVICE (IUD) REMOVAL;  Surgeon: Rubie Maid, MD;  Location:  [facility];  Service: Gynecology;  Laterality: N/A;  . LAPAROSCOPIC SALPINGO OOPHERECTOMY Bilateral 05/05/2017   Procedure: LAPAROSCOPIC BILATERAL SALPINGO OOPHORECTOMY;  Surgeon: Rubie Maid, MD;  Location: [facility] OR[facility];  Service: Gynecology;  Laterality: Bilateral;  . LAPAROSCOPY N/A 04/11/2017   Procedure: LAPAROSCOPY DIAGNOSTIC;  Surgeon: Rubie Maid, MD;  Location: [facility];  Service: Gynecology;  Laterality: N/A;  . PARTIAL HYSTERECTOMY     Ovaries and Tubes  . TUBAL LIGATION      Family History  Problem Relation  Age of Onset  . Schizophrenia Mother   . Bipolar disorder Mother   . Alcohol abuse Father   . Hypertension Father   . Seizures Paternal Grandmother   . Diabetes Paternal Grandmother   . Hypertension Brother   . Hypertension Maternal Grandmother     SOCIAL HX: Former smoker quit 2001   Current Outpatient Medications:  .  acetaminophen (TYLENOL) 325 MG tablet, Take by mouth., Disp: , Rfl:  .  amLODipine (NORVASC) 5 MG tablet, Take 1 tablet (5 mg total) by mouth daily., Disp: 90 tablet, Rfl: 1 .  baclofen (LIORESAL) 10 MG tablet, Take 1 tablet (10 mg total) by mouth daily as needed for muscle spasms., Disp: 30 tablet, Rfl: 0 .  clindamycin (CLINDAGEL) 1 % gel, Apply topically 2 (two) times daily., Disp: 30 g, Rfl: 4 .  fexofenadine (ALLEGRA ALLERGY) 180 MG tablet, Take 1 tablet (180 mg total) by mouth daily., Disp: 90 tablet, Rfl: 1 .  gabapentin (NEURONTIN) 100 MG capsule, Take 2 capsules (200 mg total) by mouth 3 (three) times daily., Disp: 180 capsule, Rfl: 3 .  hydroxychloroquine (PLAQUENIL) 200 MG tablet, Take 200 mg by mouth 2 (two) times daily., Disp: , Rfl:  .  latanoprost (XALATAN) 0.005 % ophthalmic solution, Place 1 drop into both eyes at bedtime., Disp: , Rfl:  .  Melatonin 5 MG CAPS, Take by mouth., Disp: , Rfl:  .  meloxicam (MOBIC) 7.5 MG tablet, Take 1 tablet (7.5 mg total) by mouth daily as needed for pain. With food, Disp: 90 tablet, Rfl: 0 .  montelukast (SINGULAIR) 10 MG tablet, Take 1 tablet (10 mg total) by mouth at bedtime., Disp: 90 tablet, Rfl: 1 .  nortriptyline (PAMELOR) 10 MG capsule, Take 1 capsule (10 mg total) by mouth at bedtime., Disp: 90 capsule, Rfl: 1 .  omeprazole (PRILOSEC) 20 MG capsule, Take 1 capsule (20 mg total) by mouth daily., Disp: 30 capsule, Rfl: 0 .  ondansetron (ZOFRAN ODT) 4 MG disintegrating tablet, Take 1 tablet (4 mg total) by mouth every 8 (eight) hours as needed for nausea or vomiting., Disp: 30 tablet, Rfl: 1 .  PARoxetine (PAXIL) 20  MG tablet, Take 1 tablet (20 mg total) by mouth daily., Disp: 90 tablet, Rfl: 3 .  propranolol (INDERAL) 40 MG tablet, Take 1 tablet (40 mg total) by mouth 2 (two) times daily., Disp: 60 tablet, Rfl: 0 .  timolol (BETIMOL) 0.25 % ophthalmic solution, Place 1 drop into both eyes daily., Disp: , Rfl:  .  SUMAtriptan (IMITREX) 50 MG tablet, Take 1 tablet (50 mg total) by mouth once for 1 dose. May repeat in 2 hours if headache persists or recurs., Disp: 10 tablet, Rfl: 2  EXAM:  VITALS per patient if applicable:  GENERAL: alert, oriented, appears well and in no acute distress  HEENT: atraumatic, conjunctiva clear, no obvious abnormalities on inspection of external nose and ears  NECK: normal movements of the head and neck  LUNGS: on inspection no  signs of respiratory distress, breathing rate appears normal, no obvious gross SOB, gasping or wheezing  CV: no obvious cyanosis  MS: moves all visible extremities without noticeable abnormality  PSYCH/NEURO: pleasant and cooperative, no obvious depression or anxiety, speech and thought processing grossly intact  ASSESSMENT AND PLAN:  Discussed the following assessment and plan: Cervical radicular pain  Cervical facet joint syndrome  Lumbar spondylosis  Chronic pain syndrome We discussed the treatment options for her in detail. I do recommend a trial of epidural injection for cervical and  lumbar back pain.  The risk of paralysis or serious complications is extremely low and in a skilled practitioner's hand.  Cymbalta has been used with some success for neuropathic pain and I do recommend that medication as well.  I will forward your request for Yvonne [name] opinion on these recommendations made by Dr. Holley Raring.    I discussed the assessment and treatment plan with the patient. The patient was provided an opportunity to ask questions and all were answered. The patient agreed with the plan and demonstrated an understanding of the  instructions.   The patient was advised to call back or seek an in-person evaluation if the symptoms worsen or if the condition fails to improve as anticipated.  I provided 13 minutes of non-face-to-face time during this encounter.  Denice Paradise, NP Adult Nurse Practitioner [place] [phone]

## 2020-07-16 ENCOUNTER — Encounter: Payer: Self-pay | Admitting: Nurse Practitioner

## 2020-07-16 ENCOUNTER — Telehealth: Payer: Self-pay | Admitting: Family

## 2020-07-16 DIAGNOSIS — G894 Chronic pain syndrome: Secondary | ICD-10-CM

## 2020-07-16 NOTE — Assessment & Plan Note (Signed)
I do recommend a trial of epidural injection for cervical and  lumbar back pain.  The risk of paralysis or serious complications is extremely low and in a skilled practitioner's hand.  Cymbalta has been used with some success for neuropathic pain and I do recommend that medication as well.  I will forward your request for Yvonne [name]'s opinion on these recommendations made by Dr. Holley Raring.

## 2020-07-16 NOTE — Patient Instructions (Signed)
I do recommend a trial of epidural injection for cervical and  lumbar back pain.  The risk of paralysis or serious complications is extremely low and in a skilled practitioner's hand.  I will forward your request for Yvonne [name]'s opinion on this recommendation.

## 2020-07-16 NOTE — Telephone Encounter (Signed)
Yvonne [name]  Call pt Please let her know that Maudie Mercury kindly sent me a very detailed note   I agree with Maudie Mercury very much in regards to :  1)  trial of epidural injection for cervical and  lumbar back pain with dr lateed  2) Trial Cymbalta ; we would need to wean off and stop paxil if we trial cymbalta  Let me know if can sent in cymbalta and instructions to wean off paxil

## 2020-07-16 NOTE — Telephone Encounter (Signed)
Spoke to [place], She states that she does Not want the injection for Cervical or lumbar back pain and does want to start the cymbalta and wean off the Paxil. She asks for your second opinion on changing the medication. She is having trouble sleeping at night and wanted to bring to your attention the migrains and that she is still taking the Nortriptiline. I have scheduled her a virtual appointment for 08/06/20 at 4pm.

## 2020-07-16 NOTE — Telephone Encounter (Signed)
noted 

## 2020-07-16 NOTE — Telephone Encounter (Signed)
Left message to call back  

## 2020-07-17 NOTE — Telephone Encounter (Signed)
Pt is going out of town tonight and needs a call back asap

## 2020-07-17 NOTE — Telephone Encounter (Signed)
Patient called in wanted to know if she should which too cymbalta and stop taking paxil if so she needs a prescription for cymbalta

## 2020-07-18 MED ORDER — PAROXETINE HCL 10 MG PO TABS: ORAL_TABLET | ORAL | 0 refills | Status: DC

## 2020-07-18 MED ORDER — DULOXETINE HCL 30 MG PO CPEP: 30.0000 mg | ORAL_CAPSULE | Freq: Every day | ORAL | 3 refills | Status: DC

## 2020-07-18 NOTE — Telephone Encounter (Signed)
Please call pt  She may wean of the paxil  ( ssri) by starting 10mg  tablet. She can break her current 20mg  tablet in half if scored. Otherwise I have sent in 10mg  for her take for one week, the following week, she may take 10mg  paxil QOD, then stop.   VERY IMPORTANT: After she is OFF COMPLETELY the paxil, she may start cymbalta   If she would like to wait to do this change until our appt next month, that is fine too.

## 2020-07-18 NOTE — Telephone Encounter (Signed)
Patient returned you're call .

## 2020-07-18 NOTE — Telephone Encounter (Signed)
noted 

## 2020-07-18 NOTE — Telephone Encounter (Signed)
Left message to call back  

## 2020-07-18 NOTE — Addendum Note (Signed)
Addended by: Burnard Hawthorne on: 07/18/2020 10:19 AM   Modules accepted: Orders

## 2020-07-18 NOTE — Telephone Encounter (Signed)
Called patient and gave provider instructions. Yvonne [name] agrees to start weening herself off of the Paxil and will start on Monday so she will have 1 day without any medication before her appointment on 08/06/20 with Mable Paris.

## 2020-07-19 ENCOUNTER — Encounter: Payer: Self-pay | Admitting: Family

## 2020-07-19 ENCOUNTER — Other Ambulatory Visit: Payer: Self-pay | Admitting: Family

## 2020-07-19 DIAGNOSIS — R519 Headache, unspecified: Secondary | ICD-10-CM

## 2020-07-19 DIAGNOSIS — I10 Essential (primary) hypertension: Secondary | ICD-10-CM

## 2020-07-19 DIAGNOSIS — M62838 Other muscle spasm: Secondary | ICD-10-CM

## 2020-07-21 MED ORDER — BACLOFEN 10 MG PO TABS: 10.0000 mg | ORAL_TABLET | Freq: Every day | ORAL | 0 refills | Status: DC | PRN

## 2020-07-21 MED ORDER — PROPRANOLOL HCL 40 MG PO TABS: 40.0000 mg | ORAL_TABLET | Freq: Two times a day (BID) | ORAL | 0 refills | Status: DC

## 2020-07-21 MED ORDER — OMEPRAZOLE 20 MG PO CPDR: 20.0000 mg | DELAYED_RELEASE_CAPSULE | Freq: Every day | ORAL | 0 refills | Status: DC

## 2020-07-21 NOTE — Telephone Encounter (Signed)
The patients medication is already refilled.

## 2020-08-02 IMAGING — MR MR LUMBAR SPINE W/O CM
5 series · 32 of 48 positions shown · non-contrast
Comparison: Lumbar spine radiographs 03/19/2020

CLINICAL DATA: Chronic low back pain. Paresthesias in both legs.
Incontinence.

EXAM:
MRI LUMBAR SPINE WITHOUT CONTRAST
TECHNIQUE: Multiplanar, multisequence MR imaging of the lumbar spine was
performed. No intravenous contrast was administered.

[Series 5: T2 · sagittal · 4.0mm · 0.81mm/px · 7 of 17 slices shown (1 of 2)]
[im 1/17]
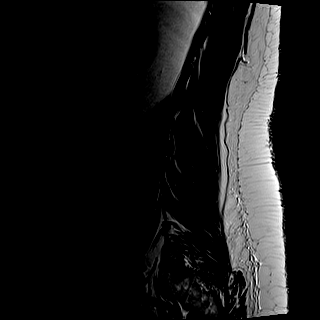
[im 3/17]
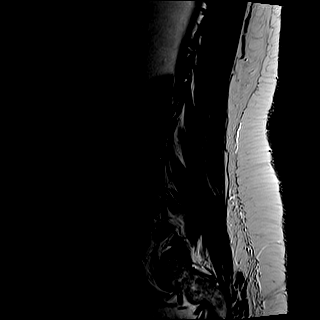
[im 6/17]
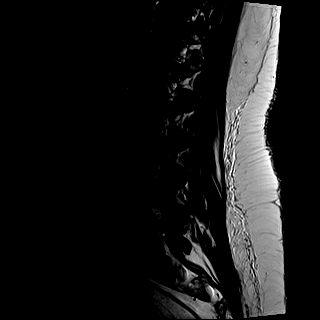
[im 9/17]
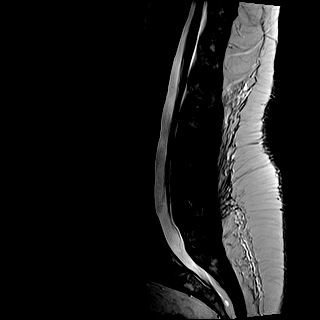
[im 11/17]
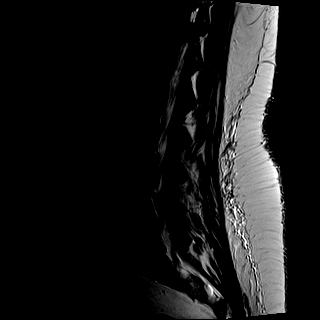
[im 14/17]
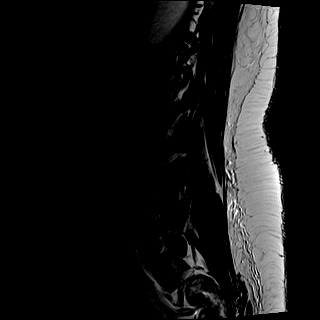
[im 17/17]
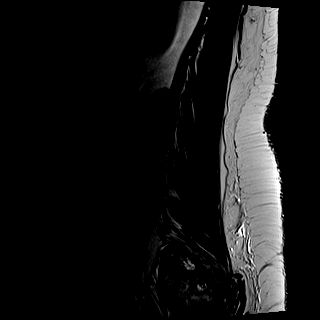

[Series 6: T1 · sagittal · 4.0mm · 0.81mm/px · 7 of 17 slices shown (1 of 2)]
[im 1/17]
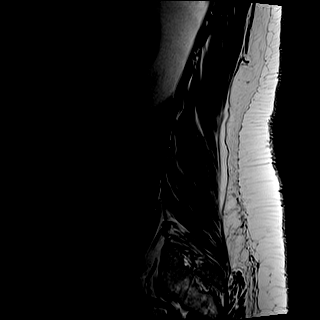
[im 3/17]
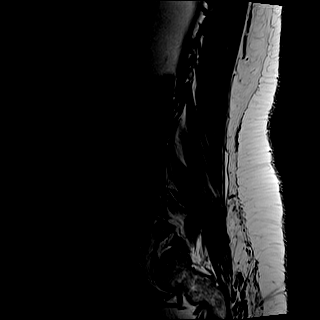
[im 6/17]
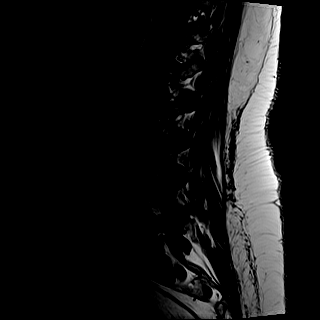
[im 9/17]
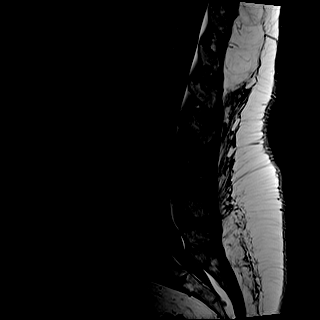
[im 11/17]
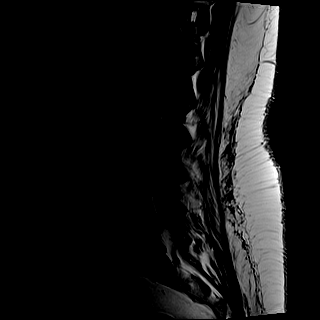
[im 14/17]
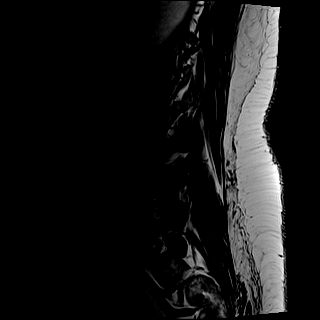
[im 17/17]
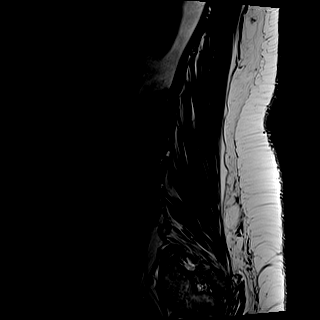

[Series 7: STIR · sagittal · 4.0mm · 0.41mm/px · 2 of 17 slices shown]
[im 1/17]
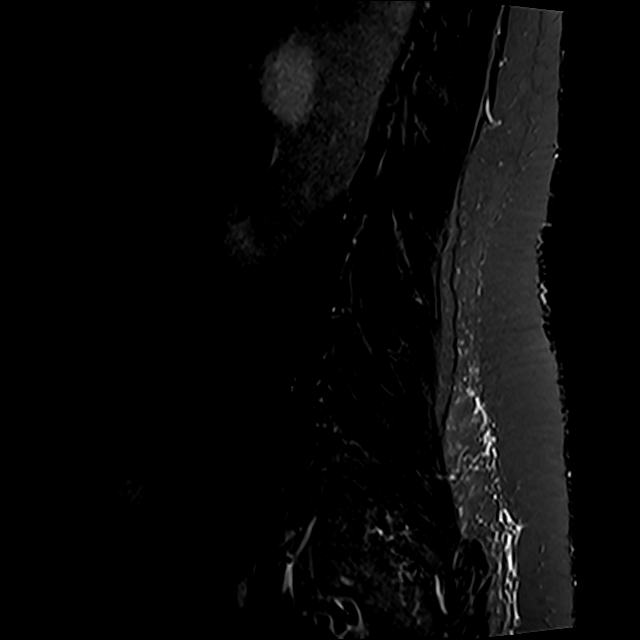
[im 4/17]
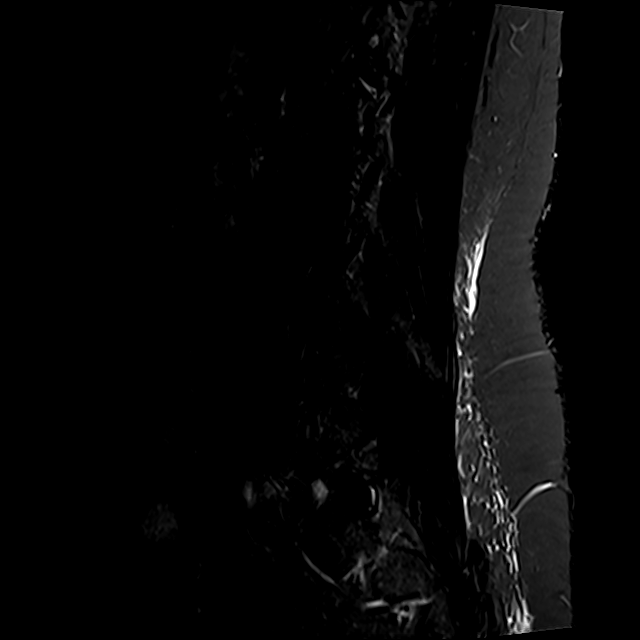

[Series 8: T2 · axial · 4.0mm · 0.78mm/px · z∈[-490,-269]mm · 8 of 38 slices shown (2 of 2)]
[im 1/38]
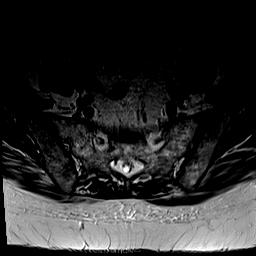
[im 6/38]
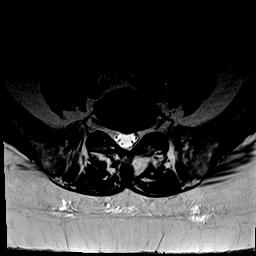
[im 12/38]
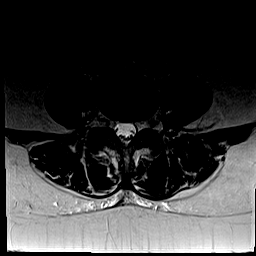
[im 18/38]
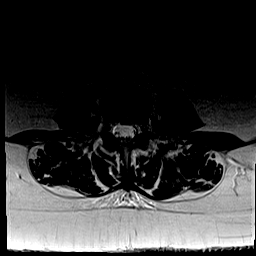
[im 20/38]
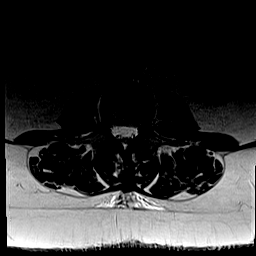
[im 26/38]
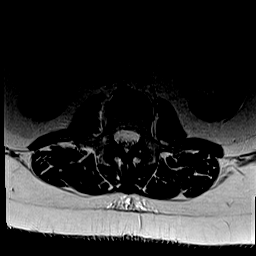
[im 32/38]
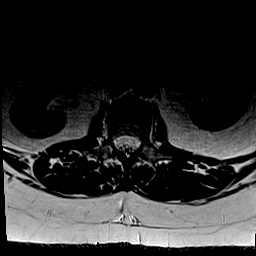
[im 38/38]
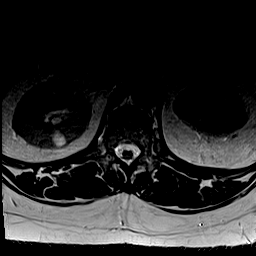

[Series 9: T1 · axial · 4.0mm · 0.39mm/px · z∈[-490,-269]mm · 8 of 38 slices shown (2 of 2)]
[im 1/38]
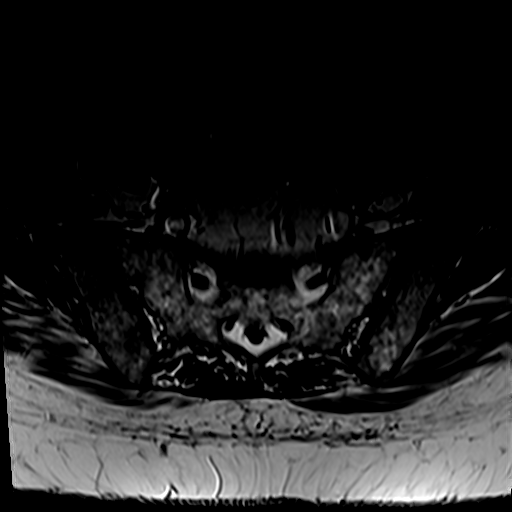
[im 6/38]
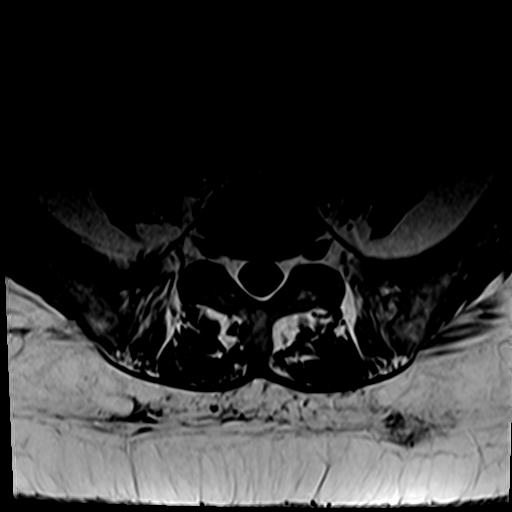
[im 12/38]
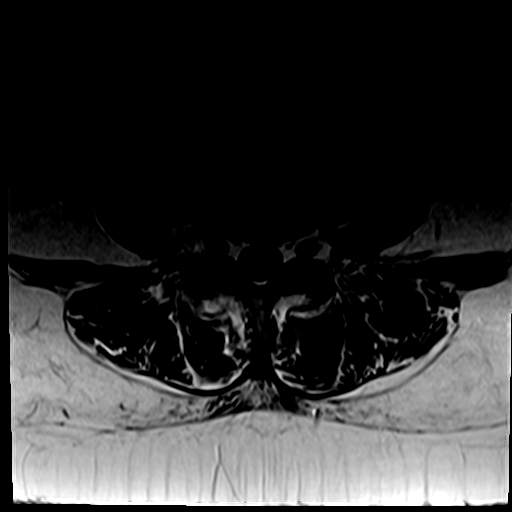
[im 18/38]
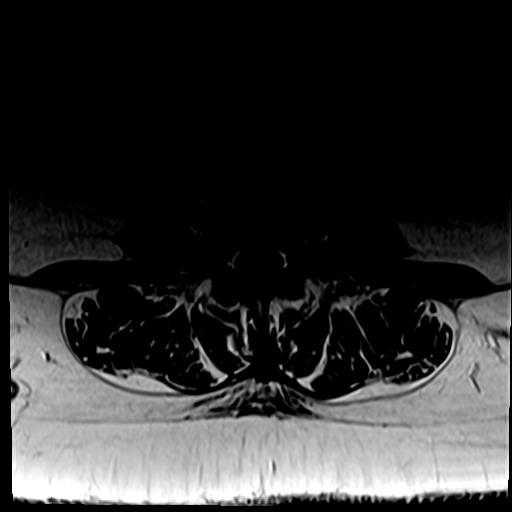
[im 20/38]
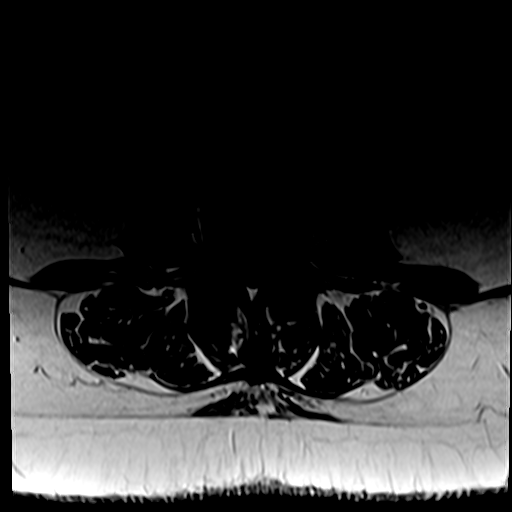
[im 26/38]
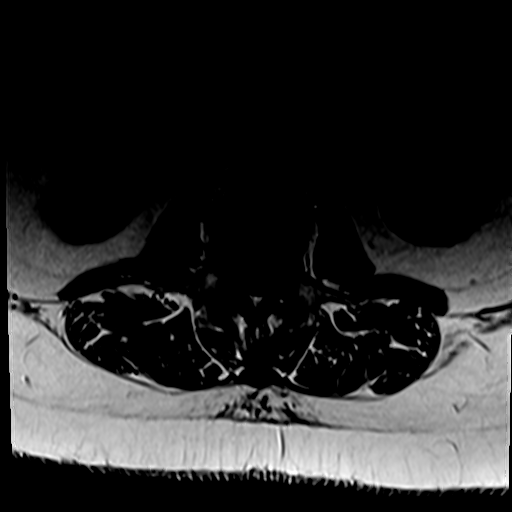
[im 32/38]
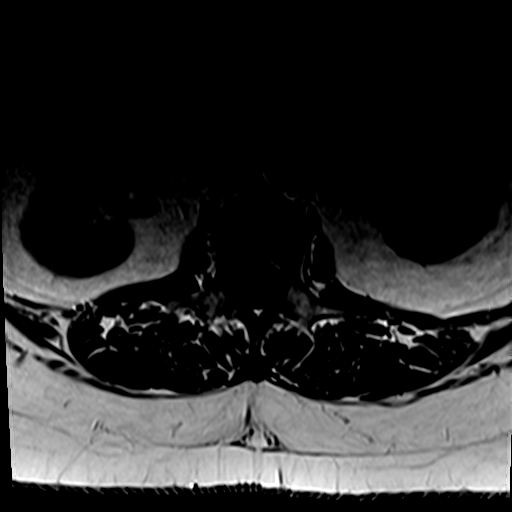
[im 38/38]
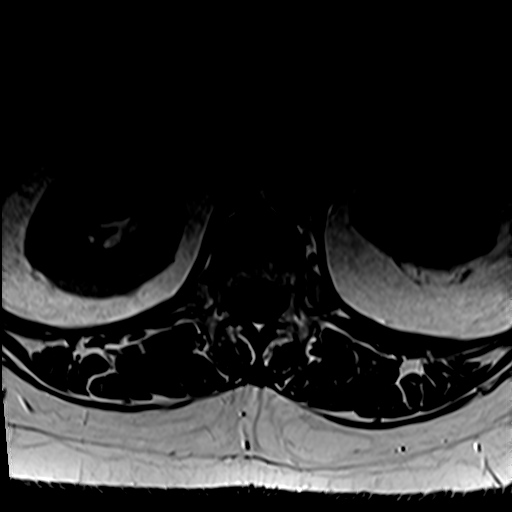

[32 of 48 positions shown; findings below may reference images not displayed]

FINDINGS: Segmentation:  Standard.

Alignment:  Trace facet mediated anterolisthesis of L4 on L5.

Vertebrae: No fracture, suspicious osseous lesion, or evidence of
discitis. Low level facet edema at L4-5.

Conus medullaris and cauda equina: Conus extends to the L1 level.
Conus and cauda equina appear normal.

Paraspinal and other soft tissues: Partially visualized 1.1 cm T2
hyperintense focus in the right kidney, likely a cyst.

Disc levels:

Disc desiccation and mild disc space narrowing from L2-3 to L5-S1.

T12-L1: Negative.

L1-2: Slight facet hypertrophy without disc herniation or stenosis.

L2-3: Minimal disc bulging and mild-to-moderate facet and ligamentum
flavum hypertrophy without stenosis.

L3-4: Minimal disc bulging and mild-to-moderate facet and ligamentum
flavum hypertrophy without stenosis.

L4-5: Anterolisthesis with minimal disc uncovering, mild-to-moderate
ligamentum flavum hypertrophy, and severe facet arthrosis. No
stenosis.

L5-S1: Small central disc protrusion and mild facet arthrosis
without stenosis.
IMPRESSION: 1. Severe L4-5 facet arthrosis with trace anterolisthesis. No
stenosis.
2. Mild disc degeneration and mild to moderate facet arthrosis
elsewhere without stenosis.

## 2020-08-05 ENCOUNTER — Ambulatory Visit (INDEPENDENT_AMBULATORY_CARE_PROVIDER_SITE_OTHER): Payer: Managed Care, Other (non HMO) | Admitting: Obstetrics and Gynecology

## 2020-08-05 ENCOUNTER — Encounter: Payer: Self-pay | Admitting: Obstetrics and Gynecology

## 2020-08-05 ENCOUNTER — Encounter: Payer: Managed Care, Other (non HMO) | Admitting: Obstetrics and Gynecology

## 2020-08-05 ENCOUNTER — Other Ambulatory Visit: Payer: Self-pay

## 2020-08-05 VITALS — BP 110/73 | HR 73 | Ht 62.0 in | Wt 241.4 lb

## 2020-08-05 DIAGNOSIS — M5136 Other intervertebral disc degeneration, lumbar region: Secondary | ICD-10-CM | POA: Diagnosis not present

## 2020-08-05 DIAGNOSIS — Z7689 Persons encountering health services in other specified circumstances: Secondary | ICD-10-CM

## 2020-08-05 DIAGNOSIS — I10 Essential (primary) hypertension: Secondary | ICD-10-CM

## 2020-08-05 DIAGNOSIS — G8929 Other chronic pain: Secondary | ICD-10-CM

## 2020-08-05 DIAGNOSIS — M25561 Pain in right knee: Secondary | ICD-10-CM

## 2020-08-05 DIAGNOSIS — M25562 Pain in left knee: Secondary | ICD-10-CM

## 2020-08-05 DIAGNOSIS — Z6841 Body Mass Index (BMI) 40.0 and over, adult: Secondary | ICD-10-CM

## 2020-08-05 MED ORDER — PHENTERMINE HCL 37.5 MG PO CAPS: 37.5000 mg | ORAL_CAPSULE | ORAL | 3 refills | Status: DC

## 2020-08-05 NOTE — Progress Notes (Signed)
GYNECOLOGY PROGRESS NOTE  Subjective:    Patient ID: Yvonne [name], female    DOB: Dec 27, 1965, 54 y.o.   MRN: [id number]  HPI  Patient is a 53 y.o. female who presents for 3 month weight management follow up. She initiated use of Phentermine 3 months ago.  Denies any undesirable side effects (except dry mouth, which is manageable by consuming more water) and reports compliance with medications.   Of note, patient states that she was seen by pain management clinic due to her DDD, osteoarthritis of her knees, and chronic leg pain.  Notes that she did not desire injections or surgery.  She was offered a trial of Cymbalta to help with her pain, however had to wean off of the Paxil first (was taking for her menopausal symptoms).  Patient notes that she has only been on the Cymbalta now for approximately 3 days as she has finally weaned off of the packs and can definitely tell a difference in her other symptoms.  She is no longer experiencing pain, she has more energy, also has helped to change her eating habits as she has developed a distaste for "bad foods".  Has been reducing her carb intake as well as avoiding fatty foods over the past week.  She is also working to incorporate a more help the diet fruits and vegetables and has decreased portion sizes. Also now is doing aquatic aerobics which she loves.  Overall is feeling a significant change her life and is really excited about weight loss.   The following portions of the patient's history were reviewed and updated as appropriate: allergies, current medications, past family history, past medical history, past social history, past surgical history and problem list.  Review of Systems Pertinent items noted in HPI and remainder of comprehensive ROS otherwise negative.   Objective:    Vitals with BMI 08/05/2020 07/15/2020 07/10/2020  Height 5\' 2"  5\' 2"  5\' 2"   Weight 241 lbs 6 oz 249 lbs 249 lbs  BMI 44.14 96.78 93.81  Systolic 017 - 510    Diastolic 73 - 72  Pulse 73 - 62    General appearance: alert Abdomen: soft, non-tender.  Waist circumference 49.5 in.    Labs:  No new labs  Assessment:   Weight management Obesity, Body mass index is 44.15 kg/m. Menopausal state Chronic knee pain DDD HTN  Plan:   1. Discussion had with patient regarding her current weight loss journey.  She has been on the Phentermine medication for approximately 3 almost 4 months.  Has only begun losing weight in the past month, possibly due to a tapering of the Paxil.  She has recently started on Cymbalta which she is noticing lots of good changes (including increase in mobility due to a decrease in pain, change in dietary preferences, and increased energy levels as to where she desires to exercise more).  Over the last month she did lose 8 pounds.  Discussed option of continuing medication as it seems like some of her barriers are being removed to weight loss attempts.  Will give patient another trial of 3 months on the phentermine.  If no significant weight loss at that time, will need to consider alternative solutions for weight loss.  Patient notes understanding.  Patient to send her weight report next month through [facility].  As televisits and weight loss visits are very expensive based on patient's insurance.  We will follow-up in person in an additional 3 months.  2. Hypertension well controlled,  no issues currently with use of the phentermine at current dosing.   Rubie Maid, MD [facility]

## 2020-08-05 NOTE — Progress Notes (Signed)
Pt present for weight management. Pt stated that she was doing well no problems. Pt's last visit 04/01/2020 wt 249lb.

## 2020-08-06 ENCOUNTER — Telehealth (INDEPENDENT_AMBULATORY_CARE_PROVIDER_SITE_OTHER): Payer: Managed Care, Other (non HMO) | Admitting: Family

## 2020-08-06 ENCOUNTER — Encounter: Payer: Self-pay | Admitting: Family

## 2020-08-06 DIAGNOSIS — G894 Chronic pain syndrome: Secondary | ICD-10-CM

## 2020-08-06 DIAGNOSIS — R768 Other specified abnormal immunological findings in serum: Secondary | ICD-10-CM

## 2020-08-06 DIAGNOSIS — G2581 Restless legs syndrome: Secondary | ICD-10-CM | POA: Diagnosis not present

## 2020-08-06 NOTE — Patient Instructions (Addendum)
Please get booster of pfizer for COVID.   Please call [facility] rheumatology to establish care with another provider: [phone]  We will continue current regimen.   See you at your physical!

## 2020-08-06 NOTE — Progress Notes (Signed)
Virtual Visit via Video Note  I connected with@  on 08/11/20 at  4:00 PM EDT by a video enabled telemedicine application and verified that I am speaking with the correct person using two identifiers.  Location patient: home Location provider:work  Persons participating in the virtual visit: patient, provider  I discussed the limitations of evaluation and management by telemedicine and the availability of in person appointments. The patient expressed understanding and agreed to proceed.   HPI: Chronic low back pain- improved. No longer having numbness in legs. No trouble urinating, having a bowel movement. No saddle anesthesia.  Takes mobic qd.taking baclofen 5mg  BID.   Sleep has improved. Started Cymbalta 4 days ago and each day seems to 'get better and better.'   Weaned off of paxil  RLS- taking 200 mg TID gabapentin with relief of symptoms. She doesn't feel too sedated on regimen.   Migraine- comes and goes. 'much better, tremendously' . Uses zofran ODT with relief for abortive therapy. HA's feel similar to HA in the past. A couple of HA's per month.  No vision loss, vision changes, aura, numbness of face.   HTN- compliant with amlodipine, propranolol.    Follows with Dr Holley Raring for pain management, has decided to hold on epidural injections.  Follows with Dr Meda Coffee for RA. Compliant plaquenil.  Follows with Dr Marcelline Mates and is on phentermine; has lost 8 lbs so far.   ROS: See pertinent positives and negatives per HPI.  Past Medical History:  Diagnosis Date  . Allergy   . Arthritis, rheumatoid ([place])   . GERD (gastroesophageal reflux disease)   . Gestational diabetes    patient denies  . Glaucoma   . H/O bronchitis   . Headache    migraine  . History of ovarian cyst   . Hypertension    pre-eclampsia with first child  . 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 Restless leg   . Shingles 2021    Past Surgical History:  Procedure Laterality Date  . CERVICAL POLYPECTOMY  N/A 04/11/2017   Procedure: CERVICAL POLYPECTOMY;  Surgeon: Rubie Maid, MD;  Location: [facility];  Service: Gynecology;  Laterality: N/A;  . HYSTEROSCOPY WITH D & C N/A 04/11/2017   Procedure: DILATATION AND CURETTAGE /HYSTEROSCOPY;  Surgeon: Rubie Maid, MD;  Location: [facility];  Service: Gynecology;  Laterality: N/A;  . IUD REMOVAL N/A 05/05/2017   Procedure: INTRAUTERINE DEVICE (IUD) REMOVAL;  Surgeon: Rubie Maid, MD;  Location: [facility];  Service: Gynecology;  Laterality: N/A;  . LAPAROSCOPIC SALPINGO OOPHERECTOMY Bilateral 05/05/2017   Procedure: LAPAROSCOPIC BILATERAL SALPINGO OOPHORECTOMY;  Surgeon: Rubie Maid, MD;  Location: [facility];  Service: Gynecology;  Laterality: Bilateral;  . LAPAROSCOPY N/A 04/11/2017   Procedure: LAPAROSCOPY DIAGNOSTIC;  Surgeon: Rubie Maid, MD;  Location: [facility];  Service: Gynecology;  Laterality: N/A;  . PARTIAL HYSTERECTOMY     Ovaries and Tubes  . TUBAL LIGATION      Family History  Problem Relation Age of Onset  . Schizophrenia Mother   . Bipolar disorder Mother   . Alcohol abuse Father   . Hypertension Father   . Seizures Paternal Grandmother   . Diabetes Paternal Grandmother   . Hypertension Brother   . Hypertension Maternal Grandmother       Current Outpatient Medications:  .  acetaminophen (TYLENOL) 325 MG tablet, Take by mouth., Disp: , Rfl:  .  amLODipine (NORVASC) 5 MG tablet, Take 1 tablet (5 mg total) by mouth daily., Disp: 90 tablet,  Rfl: 1 .  baclofen (LIORESAL) 10 MG tablet, Take 1 tablet (10 mg total) by mouth daily as needed for muscle spasms., Disp: 30 tablet, Rfl: 0 .  clindamycin (CLINDAGEL) 1 % gel, Apply topically 2 (two) times daily., Disp: 30 g, Rfl: 4 .  DULoxetine (CYMBALTA) 30 MG capsule, Take 1 capsule (30 mg total) by mouth daily. Start AFTER you are weaned off paxil, Disp: 90 capsule, Rfl: 3 .  fexofenadine (ALLEGRA ALLERGY) 180 MG tablet, Take 1 tablet (180 mg total) by mouth daily., Disp: 90 tablet, Rfl:  1 .  gabapentin (NEURONTIN) 100 MG capsule, Take 2 capsules (200 mg total) by mouth 3 (three) times daily., Disp: 180 capsule, Rfl: 3 .  hydroxychloroquine (PLAQUENIL) 200 MG tablet, Take 200 mg by mouth 2 (two) times daily., Disp: , Rfl:  .  latanoprost (XALATAN) 0.005 % ophthalmic solution, Place 1 drop into both eyes at bedtime., Disp: , Rfl:  .  Melatonin 5 MG CAPS, Take by mouth., Disp: , Rfl:  .  meloxicam (MOBIC) 7.5 MG tablet, Take 1 tablet (7.5 mg total) by mouth daily as needed for pain. With food, Disp: 90 tablet, Rfl: 0 .  montelukast (SINGULAIR) 10 MG tablet, Take 1 tablet (10 mg total) by mouth at bedtime., Disp: 90 tablet, Rfl: 1 .  nortriptyline (PAMELOR) 10 MG capsule, Take 1 capsule (10 mg total) by mouth at bedtime., Disp: 90 capsule, Rfl: 1 .  omeprazole (PRILOSEC) 20 MG capsule, Take 1 capsule (20 mg total) by mouth daily., Disp: 30 capsule, Rfl: 0 .  ondansetron (ZOFRAN ODT) 4 MG disintegrating tablet, Take 1 tablet (4 mg total) by mouth every 8 (eight) hours as needed for nausea or vomiting., Disp: 30 tablet, Rfl: 1 .  phentermine 37.5 MG capsule, Take 1 capsule (37.5 mg total) by mouth every morning., Disp: 30 capsule, Rfl: 3 .  propranolol (INDERAL) 40 MG tablet, Take 1 tablet (40 mg total) by mouth 2 (two) times daily., Disp: 60 tablet, Rfl: 0 .  SUMAtriptan (IMITREX) 50 MG tablet, Take 1 tablet (50 mg total) by mouth once for 1 dose. May repeat in 2 hours if headache persists or recurs., Disp: 10 tablet, Rfl: 2 .  timolol (BETIMOL) 0.25 % ophthalmic solution, Place 1 drop into both eyes daily., Disp: , Rfl:   EXAM:  VITALS per patient if applicable: Wt Readings from Last 3 Encounters:  08/06/20 249 lb (112.9 kg)  08/05/20 241 lb 6.4 oz (109.5 kg)  07/15/20 249 lb (112.9 kg)   Temp Readings from Last 3 Encounters:  07/10/20 97.7 F (36.5 C) (Temporal)  07/02/20 98.1 F (36.7 C) (Temporal)  05/07/20 99.6 F (37.6 C) (Oral)   BP Readings from Last 3 Encounters:   08/05/20 110/73  07/10/20 117/72  07/02/20 122/64   Pulse Readings from Last 3 Encounters:  08/05/20 73  07/10/20 62  07/02/20 62    GENERAL: alert, oriented, appears well and in no acute distress  HEENT: atraumatic, conjunttiva clear, no obvious abnormalities on inspection of external nose and ears  NECK: normal movements of the head and neck  LUNGS: on inspection no signs of respiratory distress, breathing rate appears normal, no obvious gross SOB, gasping or wheezing  CV: no obvious cyanosis  MS: moves all visible extremities without noticeable abnormality  PSYCH/NEURO: pleasant and cooperative, no obvious depression or anxiety, speech and thought processing grossly intact  ASSESSMENT AND PLAN:  Discussed the following assessment and plan:  Chronic pain syndrome  Restless leg  Rheumatoid factor positive Problem List Items Addressed This Visit      Other   Chronic pain syndrome    Pleased with improvement on cymbalta, She will continue to use with baclofen and mobic.       Restless leg    Controlled with gabapentin, Will continue.       Rheumatoid factor positive    Compliant with plaquenil. Advised follow up with rheumatology and to re-establish with another provider as Dr Meda Coffee has left practice. Advised as well to get covid booster as she is immunocompromised.         Note: plans to come here for CPE.   -we discussed possible serious and likely etiologies, options for evaluation and workup, limitations of telemedicine visit vs in person visit, treatment, treatment risks and precautions. Pt prefers to treat via telemedicine empirically rather then risking or undertaking an in person visit at this moment.     I discussed the assessment and treatment plan with the patient. The patient was provided an opportunity to ask questions and all were answered. The patient agreed with the plan and demonstrated an understanding of the instructions.   The patient was  advised to call back or seek an in-person evaluation if the symptoms worsen or if the condition fails to improve as anticipated.   Mable Paris, FNP

## 2020-08-11 ENCOUNTER — Encounter: Payer: Self-pay | Admitting: Family

## 2020-08-11 ENCOUNTER — Other Ambulatory Visit: Payer: Self-pay

## 2020-08-11 DIAGNOSIS — G2581 Restless legs syndrome: Secondary | ICD-10-CM

## 2020-08-11 MED ORDER — GABAPENTIN 100 MG PO CAPS: 200.0000 mg | ORAL_CAPSULE | Freq: Three times a day (TID) | ORAL | 3 refills | Status: DC

## 2020-08-11 NOTE — Assessment & Plan Note (Signed)
Pleased with improvement on cymbalta, She will continue to use with baclofen and mobic.

## 2020-08-11 NOTE — Assessment & Plan Note (Signed)
Compliant with plaquenil. Advised follow up with rheumatology and to re-establish with another provider as Dr Meda Coffee has left practice. Advised as well to get covid booster as she is immunocompromised.

## 2020-08-11 NOTE — Assessment & Plan Note (Signed)
Controlled with gabapentin, Will continue.

## 2020-08-17 ENCOUNTER — Other Ambulatory Visit: Payer: Self-pay | Admitting: Family

## 2020-08-17 ENCOUNTER — Encounter: Payer: Self-pay | Admitting: Family

## 2020-08-17 DIAGNOSIS — I10 Essential (primary) hypertension: Secondary | ICD-10-CM

## 2020-08-17 DIAGNOSIS — R519 Headache, unspecified: Secondary | ICD-10-CM

## 2020-08-17 DIAGNOSIS — G43809 Other migraine, not intractable, without status migrainosus: Secondary | ICD-10-CM

## 2020-08-17 DIAGNOSIS — M62838 Other muscle spasm: Secondary | ICD-10-CM

## 2020-08-18 ENCOUNTER — Other Ambulatory Visit: Payer: Self-pay

## 2020-08-18 ENCOUNTER — Encounter: Payer: Self-pay | Admitting: Family

## 2020-08-18 DIAGNOSIS — I10 Essential (primary) hypertension: Secondary | ICD-10-CM

## 2020-08-18 DIAGNOSIS — R519 Headache, unspecified: Secondary | ICD-10-CM

## 2020-08-18 MED ORDER — PROPRANOLOL HCL 40 MG PO TABS: 40.0000 mg | ORAL_TABLET | Freq: Two times a day (BID) | ORAL | 0 refills | Status: DC

## 2020-08-18 MED ORDER — BACLOFEN 10 MG PO TABS: 10.0000 mg | ORAL_TABLET | Freq: Every day | ORAL | 0 refills | Status: DC | PRN

## 2020-08-18 MED ORDER — SUMATRIPTAN SUCCINATE 50 MG PO TABS: 50.0000 mg | ORAL_TABLET | Freq: Once | ORAL | 2 refills | Status: DC

## 2020-08-18 MED ORDER — OMEPRAZOLE 20 MG PO CPDR: 20.0000 mg | DELAYED_RELEASE_CAPSULE | Freq: Every day | ORAL | 0 refills | Status: DC

## 2020-08-19 ENCOUNTER — Other Ambulatory Visit: Payer: Self-pay | Admitting: Family

## 2020-08-19 DIAGNOSIS — M62838 Other muscle spasm: Secondary | ICD-10-CM

## 2020-08-19 MED ORDER — BACLOFEN 10 MG PO TABS: 10.0000 mg | ORAL_TABLET | Freq: Every day | ORAL | 1 refills | Status: DC | PRN

## 2020-08-29 ENCOUNTER — Encounter: Payer: Self-pay | Admitting: Family

## 2020-09-04 ENCOUNTER — Other Ambulatory Visit: Payer: Managed Care, Other (non HMO)

## 2020-09-04 ENCOUNTER — Other Ambulatory Visit: Payer: Self-pay

## 2020-09-04 DIAGNOSIS — M059 Rheumatoid arthritis with rheumatoid factor, unspecified: Secondary | ICD-10-CM

## 2020-09-04 DIAGNOSIS — Z111 Encounter for screening for respiratory tuberculosis: Secondary | ICD-10-CM

## 2020-09-06 LAB — QUANTIFERON-TB GOLD PLUS
QuantiFERON Mitogen Value: >10 IU/mL
QuantiFERON Nil Value: 0.05 IU/mL
QuantiFERON TB1 Ag Value: 0.06 IU/mL
QuantiFERON TB2 Ag Value: 0.06 IU/mL
QuantiFERON-TB Gold Plus: NEGATIVE

## 2020-09-06 LAB — C-REACTIVE PROTEIN: CRP: 3 mg/L (ref 0–10)

## 2020-09-08 ENCOUNTER — Encounter: Payer: Self-pay | Admitting: Family

## 2020-09-08 ENCOUNTER — Other Ambulatory Visit: Payer: Self-pay

## 2020-09-08 DIAGNOSIS — I10 Essential (primary) hypertension: Secondary | ICD-10-CM

## 2020-09-08 DIAGNOSIS — R42 Dizziness and giddiness: Secondary | ICD-10-CM

## 2020-09-08 DIAGNOSIS — R519 Headache, unspecified: Secondary | ICD-10-CM

## 2020-09-08 DIAGNOSIS — M62838 Other muscle spasm: Secondary | ICD-10-CM

## 2020-09-08 MED ORDER — FEXOFENADINE HCL 180 MG PO TABS: 180.0000 mg | ORAL_TABLET | Freq: Every day | ORAL | 1 refills | Status: DC

## 2020-09-08 MED ORDER — OMEPRAZOLE 20 MG PO CPDR
20.0000 mg | DELAYED_RELEASE_CAPSULE | Freq: Every day | ORAL | 0 refills | Status: DC
Start: 2020-09-08 — End: 2020-10-21

## 2020-09-08 MED ORDER — PROPRANOLOL HCL 40 MG PO TABS: 40.0000 mg | ORAL_TABLET | Freq: Two times a day (BID) | ORAL | 2 refills | Status: DC

## 2020-09-10 ENCOUNTER — Other Ambulatory Visit: Payer: Self-pay | Admitting: Family

## 2020-09-10 ENCOUNTER — Other Ambulatory Visit: Payer: Self-pay

## 2020-09-10 DIAGNOSIS — G43809 Other migraine, not intractable, without status migrainosus: Secondary | ICD-10-CM

## 2020-09-10 DIAGNOSIS — I10 Essential (primary) hypertension: Secondary | ICD-10-CM

## 2020-09-10 MED ORDER — AMLODIPINE BESYLATE 5 MG PO TABS: 5.0000 mg | ORAL_TABLET | Freq: Every day | ORAL | 1 refills | Status: DC

## 2020-09-10 MED ORDER — ONDANSETRON 4 MG PO TBDP: 4.0000 mg | ORAL_TABLET | Freq: Three times a day (TID) | ORAL | 1 refills | Status: DC | PRN

## 2020-09-15 ENCOUNTER — Ambulatory Visit: Payer: Managed Care, Other (non HMO) | Admitting: Family

## 2020-09-18 MED ORDER — PROPRANOLOL HCL 40 MG PO TABS: 40.0000 mg | ORAL_TABLET | Freq: Two times a day (BID) | ORAL | 2 refills | Status: DC

## 2020-09-18 MED ORDER — BACLOFEN 10 MG PO TABS: 10.0000 mg | ORAL_TABLET | Freq: Every day | ORAL | 1 refills | Status: DC | PRN

## 2020-09-18 NOTE — Telephone Encounter (Signed)
Pt said she needs baclofen sent to [facility].

## 2020-10-05 ENCOUNTER — Other Ambulatory Visit: Payer: Self-pay | Admitting: Family

## 2020-10-05 DIAGNOSIS — G8929 Other chronic pain: Secondary | ICD-10-CM

## 2020-10-06 MED ORDER — MELOXICAM 7.5 MG PO TABS: 7.5000 mg | ORAL_TABLET | Freq: Every day | ORAL | 0 refills | Status: DC | PRN

## 2020-10-08 ENCOUNTER — Ambulatory Visit: Payer: Managed Care, Other (non HMO) | Admitting: Family

## 2020-10-21 ENCOUNTER — Other Ambulatory Visit: Payer: Self-pay | Admitting: Family

## 2020-10-22 MED ORDER — OMEPRAZOLE 20 MG PO CPDR
20.0000 mg | DELAYED_RELEASE_CAPSULE | Freq: Every day | ORAL | 0 refills | Status: DC
Start: 2020-10-22 — End: 2020-11-19

## 2020-10-30 ENCOUNTER — Encounter: Payer: Self-pay | Admitting: Family

## 2020-10-31 ENCOUNTER — Other Ambulatory Visit: Payer: Self-pay | Admitting: Family

## 2020-10-31 DIAGNOSIS — L600 Ingrowing nail: Secondary | ICD-10-CM

## 2020-11-03 NOTE — Progress Notes (Unsigned)
Pt present for weight management. Pt's denies any side effects from the medication Phentermine at this time. Pt's last visit 08/05/2020 wt 241.0lb

## 2020-11-04 ENCOUNTER — Other Ambulatory Visit: Payer: Self-pay

## 2020-11-04 ENCOUNTER — Encounter: Payer: Self-pay | Admitting: Obstetrics and Gynecology

## 2020-11-04 ENCOUNTER — Ambulatory Visit (INDEPENDENT_AMBULATORY_CARE_PROVIDER_SITE_OTHER): Payer: Managed Care, Other (non HMO) | Admitting: Obstetrics and Gynecology

## 2020-11-04 VITALS — BP 140/80 | HR 57 | Ht 62.0 in | Wt 232.6 lb

## 2020-11-04 DIAGNOSIS — M25562 Pain in left knee: Secondary | ICD-10-CM

## 2020-11-04 DIAGNOSIS — Z7689 Persons encountering health services in other specified circumstances: Secondary | ICD-10-CM

## 2020-11-04 DIAGNOSIS — E785 Hyperlipidemia, unspecified: Secondary | ICD-10-CM

## 2020-11-04 DIAGNOSIS — Z6841 Body Mass Index (BMI) 40.0 and over, adult: Secondary | ICD-10-CM

## 2020-11-04 DIAGNOSIS — I10 Essential (primary) hypertension: Secondary | ICD-10-CM | POA: Diagnosis not present

## 2020-11-04 DIAGNOSIS — M25561 Pain in right knee: Secondary | ICD-10-CM

## 2020-11-04 DIAGNOSIS — G8929 Other chronic pain: Secondary | ICD-10-CM

## 2020-11-04 MED ORDER — PHENTERMINE HCL 37.5 MG PO CAPS
37.5000 mg | ORAL_CAPSULE | ORAL | 2 refills | Status: DC
Start: 2020-11-04 — End: 2020-12-11

## 2020-11-04 NOTE — Patient Instructions (Signed)
Preventing Unhealthy Weight Gain, Adult Staying at a healthy weight is important to your overall health. When fat builds up in your body, you may become overweight or obese. Being overweight or obese increases your risk of developing certain health problems, such as heart disease, diabetes, sleeping problems, joint problems, and some types of cancer. Unhealthy weight gain is often the result of making unhealthy food choices or not getting enough exercise. You can make changes to your lifestyle to prevent obesity and stay as healthy as possible. What nutrition changes can be made?   Eat only as much as your body needs. To do this: ? Pay attention to signs that you are hungry or full. Stop eating as soon as you feel full. ? If you feel hungry, try drinking water first before eating. Drink enough water so your urine is clear or pale yellow. ? Eat smaller portions. Pay attention to portion sizes when eating out. ? Look at serving sizes on food labels. Most foods contain more than one serving per container. ? Eat the recommended number of calories for your gender and activity level. For most active people, a daily total of 2,000 calories is appropriate. If you are trying to lose weight or are not very active, you may need to eat fewer calories. Talk with your health care provider or a diet and nutrition specialist (dietitian) about how many calories you need each day.  Choose healthy foods, such as: ? Fruits and vegetables. At each meal, try to fill at least half of your plate with fruits and vegetables. ? Whole grains, such as whole-wheat bread, brown rice, and quinoa. ? Lean meats, such as chicken or fish. ? Other healthy proteins, such as beans, eggs, or tofu. ? Healthy fats, such as nuts, seeds, fatty fish, and olive oil. ? Low-fat or fat-free dairy products.  Check food labels, and avoid food and drinks that: ? Are high in calories. ? Have added sugar. ? Are high in sodium. ? Have saturated  fats or trans fats.  Cook foods in healthier ways, such as by baking, broiling, or grilling.  Make a meal plan for the week, and shop with a grocery list to help you stay on track with your purchases. Try to avoid going to the grocery store when you are hungry.  When grocery shopping, try to shop around the outside of the store first, where the fresh foods are. Doing this helps you to avoid prepackaged foods, which can be high in sugar, salt (sodium), and fat. What lifestyle changes can be made?   Exercise for 30 or more minutes on 5 or more days each week. Exercising may include brisk walking, yard work, biking, running, swimming, and team sports like basketball and soccer. Ask your health care provider which exercises are safe for you.  Do muscle-strengthening activities, such as lifting weights or using resistance bands, on 2 or more days a week.  Do not use any products that contain nicotine or tobacco, such as cigarettes and e-cigarettes. If you need help quitting, ask your health care provider.  Limit alcohol intake to no more than 1 drink a day for nonpregnant women and 2 drinks a day for men. One drink equals 12 oz of beer, 5 oz of wine, or 1 oz of hard liquor.  Try to get 7-9 hours of sleep each night. What other changes can be made?  Keep a food and activity journal to keep track of: ? What you ate and how many calories   you had. Remember to count the calories in sauces, dressings, and side dishes. ? Whether you were active, and what exercises you did. ? Your calorie, weight, and activity goals.  Check your weight regularly. Track any changes. If you notice you have gained weight, make changes to your diet or activity routine.  Avoid taking weight-loss medicines or supplements. Talk to your health care provider before starting any new medicine or supplement.  Talk to your health care provider before trying any new diet or exercise plan. Why are these changes  important? Eating healthy, staying active, and having healthy habits can help you to prevent obesity. Those changes also:  Help you manage stress and emotions.  Help you connect with friends and family.  Improve your self-esteem.  Improve your sleep.  Prevent long-term health problems. What can happen if changes are not made? Being obese or overweight can cause you to develop joint or bone problems, which can make it hard for you to stay active or do activities you enjoy. Being obese or overweight also puts stress on your heart and lungs and can lead to health problems like diabetes, heart disease, and some cancers. Where to find more information Talk with your health care provider or a dietitian about healthy eating and healthy lifestyle choices. You may also find information from:  U.S. Department of Agriculture, MyPlate: www.choosemyplate.gov  American Heart Association: www.heart.org  Centers for Disease Control and Prevention: www.cdc.gov Summary  Staying at a healthy weight is important to your overall health. It helps you to prevent certain diseases and health problems, such as heart disease, diabetes, joint problems, sleep disorders, and some types of cancer.  Being obese or overweight can cause you to develop joint or bone problems, which can make it hard for you to stay active or do activities you enjoy.  You can prevent unhealthy weight gain by eating a healthy diet, exercising regularly, not smoking, limiting alcohol, and getting enough sleep.  Talk with your health care provider or a dietitian for guidance about healthy eating and healthy lifestyle choices. This information is not intended to replace advice given to you by your health care provider. Make sure you discuss any questions you have with your health care provider. Document Revised: 11/18/2017 Document Reviewed: 12/22/2016 Elsevier Patient Education  2020 Elsevier Inc.  

## 2020-11-04 NOTE — Progress Notes (Signed)
GYNECOLOGY PROGRESS NOTE  Subjective:    Patient ID: Yvonne [name], female    DOB: [birth year], 54 y.o.   MRN: [id number]  HPI  Patient is a 54 y.o. female who presents for 3 month weight management follow up. She initiated use of Phentermine 6 months ago. Only began losing weight recently after stopping Paxil 3 months ago. Denies any undesirable side effects and reports compliance with medications.  Has recently started losing more weight    Current interventions:  1. Diet - Mostly eating salads, trying to refrain from sweets.  Grilled chicken and occasional hamburger (no bun).  Has limited portion sizes. Drinks ~ 1/2 gallon of water per day, working to increase.  2. Activity - no formal exercise due to her joint issuess, but is doing a lot more house cleaning as she has more energy and her pain is less after starting Humira (has received 3 shots so far). Also just got a gym membership and plans to do water aerobic exercises.  3. Reports bowel movements are normal.    The following portions of the patient's history were reviewed and updated as appropriate: allergies, current medications, past family history, past medical history, past social history, past surgical history and problem list.  Current Outpatient Medications on File Prior to Visit  Medication Sig Dispense Refill  . acetaminophen (TYLENOL) 325 MG tablet Take by mouth.    . Adalimumab (HUMIRA PEN [place]) Inject into the skin.    Marland Kitchen amLODipine (NORVASC) 5 MG tablet Take 1 tablet (5 mg total) by mouth daily. 90 tablet 1  . baclofen (LIORESAL) 10 MG tablet Take 1 tablet (10 mg total) by mouth daily as needed for muscle spasms. 30 tablet 1  . clindamycin (CLINDAGEL) 1 % gel Apply topically 2 (two) times daily. 30 g 4  . DULoxetine (CYMBALTA) 30 MG capsule Take 1 capsule (30 mg total) by mouth daily. Start AFTER you are weaned off paxil 90 capsule 3  . fexofenadine (ALLEGRA ALLERGY) 180 MG tablet Take 1 tablet (180 mg total) by mouth  daily. 90 tablet 1  . gabapentin (NEURONTIN) 100 MG capsule Take 2 capsules (200 mg total) by mouth 3 (three) times daily. 180 capsule 3  . hydroxychloroquine (PLAQUENIL) 200 MG tablet Take 200 mg by mouth 2 (two) times daily.    Marland Kitchen latanoprost (XALATAN) 0.005 % ophthalmic solution Place 1 drop into both eyes at bedtime.    . Melatonin 5 MG CAPS Take by mouth.    . meloxicam (MOBIC) 7.5 MG tablet Take 1 tablet (7.5 mg total) by mouth daily as needed for pain. With food 90 tablet 0  . montelukast (SINGULAIR) 10 MG tablet Take 1 tablet (10 mg total) by mouth at bedtime. 90 tablet 1  . nortriptyline (PAMELOR) 10 MG capsule Take 1 capsule (10 mg total) by mouth at bedtime. 90 capsule 1  . omeprazole (PRILOSEC) 20 MG capsule Take 1 capsule (20 mg total) by mouth daily. 30 capsule 0  . ondansetron (ZOFRAN ODT) 4 MG disintegrating tablet Take 1 tablet (4 mg total) by mouth every 8 (eight) hours as needed for nausea or vomiting. 30 tablet 1  . propranolol (INDERAL) 40 MG tablet Take 1 tablet (40 mg total) by mouth 2 (two) times daily. 60 tablet 2  . SUMAtriptan (IMITREX) 50 MG tablet Take 1 tablet (50 mg total) by mouth once for 1 dose. May repeat in 2 hours if headache persists or recurs. 10 tablet 2  . timolol (BETIMOL)  0.25 % ophthalmic solution Place 1 drop into both eyes daily.     No current facility-administered medications on file prior to visit.     Review of Systems Pertinent items noted in HPI and remainder of comprehensive ROS otherwise negative.   Objective:    Vitals with BMI 11/04/2020 08/06/2020 08/05/2020  Height 5\' 2"  5\' 2"  5\' 2"   Weight 232 lbs 10 oz 249 lbs 241 lbs 6 oz  BMI 42.53 66.06 00.45  Systolic 997 - 741  Diastolic 80 - 73  Pulse 57 - 73    General appearance: alert and no distress Abdomen: soft, non-tender.  Waist circumference 48 in.    Labs:  No new labs  Assessment:   Weight management Obesity, Body mass index is 42.54 kg/m. Dyslipidemia Essential  HTN  Plan:   1. Patient reports that she is now finally losing weight on the medication after discontinuation of her Paxil. Also able to start exercising more as her joint pain has improved with use of Humira.  Discussed risks vs benefits of continued use of medication as patient has been on medication for total of ~ 5-6 months, but only has note effects in last 3 months after stopping other medications that interfered with weight loss. Will give another 3-4 months and reassess. Patient plans to begin exercising (water aerobics).  2. Chronic HTN - currently managed, is still ok to continue on medication.  3. Dyslipidemia - will recheck levels at next visit if significant weight loss noted.    RTC in 3 months.    Rubie Maid, MD [facility]

## 2020-11-18 ENCOUNTER — Other Ambulatory Visit: Payer: Self-pay | Admitting: Family

## 2020-11-18 ENCOUNTER — Encounter: Payer: Self-pay | Admitting: Family

## 2020-11-18 DIAGNOSIS — M62838 Other muscle spasm: Secondary | ICD-10-CM

## 2020-11-19 ENCOUNTER — Other Ambulatory Visit: Payer: Self-pay | Admitting: Family

## 2020-11-19 ENCOUNTER — Encounter: Payer: Self-pay | Admitting: Family

## 2020-11-19 DIAGNOSIS — M62838 Other muscle spasm: Secondary | ICD-10-CM

## 2020-11-19 DIAGNOSIS — M25562 Pain in left knee: Secondary | ICD-10-CM

## 2020-11-19 DIAGNOSIS — G8929 Other chronic pain: Secondary | ICD-10-CM

## 2020-11-19 MED ORDER — OMEPRAZOLE 20 MG PO CPDR
20.0000 mg | DELAYED_RELEASE_CAPSULE | Freq: Every day | ORAL | 0 refills | Status: DC
Start: 2020-11-19 — End: 2021-03-13

## 2020-11-19 MED ORDER — BACLOFEN 10 MG PO TABS: ORAL_TABLET | ORAL | 3 refills | Status: DC

## 2020-11-19 MED ORDER — OMEPRAZOLE 20 MG PO CPDR
20.0000 mg | DELAYED_RELEASE_CAPSULE | Freq: Every day | ORAL | 0 refills | Status: DC
Start: 2020-11-19 — End: 2020-11-19

## 2020-11-19 MED ORDER — MELOXICAM 7.5 MG PO TABS: 7.5000 mg | ORAL_TABLET | Freq: Every day | ORAL | 0 refills | Status: DC | PRN

## 2020-12-09 ENCOUNTER — Encounter: Payer: Self-pay | Admitting: Family

## 2020-12-10 ENCOUNTER — Other Ambulatory Visit: Payer: Self-pay | Admitting: Family

## 2020-12-10 DIAGNOSIS — G2581 Restless legs syndrome: Secondary | ICD-10-CM

## 2020-12-11 ENCOUNTER — Other Ambulatory Visit: Payer: Self-pay

## 2020-12-11 DIAGNOSIS — G2581 Restless legs syndrome: Secondary | ICD-10-CM

## 2020-12-11 MED ORDER — GABAPENTIN 100 MG PO CAPS: 200.0000 mg | ORAL_CAPSULE | Freq: Three times a day (TID) | ORAL | 3 refills | Status: DC

## 2020-12-11 MED ORDER — PHENTERMINE HCL 37.5 MG PO CAPS
37.5000 mg | ORAL_CAPSULE | ORAL | 2 refills | Status: DC
Start: 2020-12-11 — End: 2021-03-04

## 2020-12-12 NOTE — Telephone Encounter (Signed)
thanks

## 2020-12-17 ENCOUNTER — Encounter: Payer: Self-pay | Admitting: Family

## 2020-12-17 DIAGNOSIS — J3089 Other allergic rhinitis: Secondary | ICD-10-CM

## 2020-12-18 MED ORDER — MONTELUKAST SODIUM 10 MG PO TABS: 10.0000 mg | ORAL_TABLET | Freq: Every day | ORAL | 1 refills | Status: DC

## 2020-12-19 ENCOUNTER — Telehealth (INDEPENDENT_AMBULATORY_CARE_PROVIDER_SITE_OTHER): Payer: Managed Care, Other (non HMO) | Admitting: Family

## 2020-12-19 ENCOUNTER — Other Ambulatory Visit: Payer: Self-pay

## 2020-12-19 ENCOUNTER — Encounter: Payer: Self-pay | Admitting: Family

## 2020-12-19 VITALS — BP 112/92 | Ht 62.0 in | Wt 231.0 lb

## 2020-12-19 DIAGNOSIS — J3089 Other allergic rhinitis: Secondary | ICD-10-CM

## 2020-12-19 DIAGNOSIS — U071 COVID-19: Secondary | ICD-10-CM | POA: Diagnosis not present

## 2020-12-19 DIAGNOSIS — G43809 Other migraine, not intractable, without status migrainosus: Secondary | ICD-10-CM

## 2020-12-19 MED ORDER — AMOXICILLIN-POT CLAVULANATE 875-125 MG PO TABS: 1.0000 | ORAL_TABLET | Freq: Two times a day (BID) | ORAL | 0 refills | Status: AC

## 2020-12-19 MED ORDER — FLUCONAZOLE 150 MG PO TABS: 150.0000 mg | ORAL_TABLET | Freq: Once | ORAL | 1 refills | Status: AC

## 2020-12-19 MED ORDER — BENZONATATE 100 MG PO CAPS: 100.0000 mg | ORAL_CAPSULE | Freq: Three times a day (TID) | ORAL | 1 refills | Status: DC | PRN

## 2020-12-19 MED ORDER — MONTELUKAST SODIUM 10 MG PO TABS: 10.0000 mg | ORAL_TABLET | Freq: Every day | ORAL | 1 refills | Status: DC

## 2020-12-19 MED ORDER — ONDANSETRON 4 MG PO TBDP: 4.0000 mg | ORAL_TABLET | Freq: Three times a day (TID) | ORAL | 1 refills | Status: DC | PRN

## 2020-12-19 NOTE — Progress Notes (Signed)
Verbal consent for services obtained from patient prior to services given to TELEPHONE visit:   Location of call:  provider at work patient at home  Names of all persons present for services: Mable Paris, NP and patient Chief complaint:  Nasal congestion for 10 days , worsening. Congestion is thick and brown-green.  Endorses productive cough, maxillary facial pain, ears popping, sinus drainage worse when laying down. No sore throat, fever, SOB, fever, diarrhea, nausea.  Eating and drinking okay and working on drinking more water.   COVID positive 10 days; vaccinated ( 3 vaccine due to immunocompromised, last 08/08/20)  Some relief with dayquil.  H/o seasonal allergies   No recent antibiotic.  She gets yeast infection when she is on an antibiotic.   A/P/next steps:  Problem List Items Addressed This Visit      Other   COVID-19 - Primary    Afebrile. No acute respiratory distress. Duration 10 days ; outside of window for MAI. Concern for secondary bacterial infection and agreed starting antibiotic reasonable. Start augmentin, tessalon prn. She will also start probiotic. Advised to get booster vaccine in Feb 2022.      Relevant Medications   amoxicillin-clavulanate (AUGMENTIN) 875-125 MG tablet   benzonatate (TESSALON) 100 MG capsule   fluconazole (DIFLUCAN) 150 MG tablet       I spent 10 min  discussing plan of care over the phone.

## 2020-12-19 NOTE — Assessment & Plan Note (Addendum)
Afebrile. No acute respiratory distress. Duration 10 days ; outside of window for MAI. Concern for secondary bacterial infection and agreed starting antibiotic reasonable. Start augmentin, tessalon prn. She will also start probiotic. Advised to get booster vaccine in Feb 2022.

## 2020-12-19 NOTE — Patient Instructions (Addendum)
Booster with Pfizer covid vaccine 5 months after rd vaccine ( 08/08/20); due February.   Start augmentin Ensure to take probiotics while on antibiotics and also for 2 weeks after completion. It is important to re-colonize the gut with good bacteria and also to prevent any diarrheal infections associated with antibiotic use.   Tessalon perles for cough  Let me know if doesn't resolve

## 2020-12-23 ENCOUNTER — Ambulatory Visit
Admission: RE | Admit: 2020-12-23 | Discharge: 2020-12-23 | Disposition: A | Discharge status: Home / Self Care | Payer: Managed Care, Other (non HMO) | Source: Ambulatory Visit | Attending: Family | Admitting: Family

## 2020-12-25 ENCOUNTER — Telehealth: Payer: Self-pay | Admitting: Family

## 2020-12-25 DIAGNOSIS — I10 Essential (primary) hypertension: Secondary | ICD-10-CM

## 2020-12-25 DIAGNOSIS — R519 Headache, unspecified: Secondary | ICD-10-CM

## 2020-12-25 MED ORDER — PROPRANOLOL HCL 40 MG PO TABS: 40.0000 mg | ORAL_TABLET | Freq: Two times a day (BID) | ORAL | 2 refills | Status: DC

## 2020-12-25 NOTE — Telephone Encounter (Signed)
Patient called in for refill for propranolol (INDERAL) 40 MG tablet

## 2020-12-26 ENCOUNTER — Encounter: Payer: Self-pay | Admitting: Family

## 2020-12-26 DIAGNOSIS — R519 Headache, unspecified: Secondary | ICD-10-CM

## 2020-12-26 MED ORDER — NORTRIPTYLINE HCL 10 MG PO CAPS: 10.0000 mg | ORAL_CAPSULE | Freq: Every day | ORAL | 1 refills | Status: DC

## 2020-12-26 NOTE — Telephone Encounter (Signed)
Pt needs refill ASAP. Please send today

## 2020-12-29 NOTE — Telephone Encounter (Signed)
Medication was sent in 12/26/2020.

## 2020-12-31 ENCOUNTER — Encounter: Payer: Self-pay | Admitting: Student in an Organized Health Care Education/Training Program

## 2020-12-31 NOTE — Telephone Encounter (Signed)
Please activate PRN order for C-ESI and get her scheduled

## 2021-01-01 ENCOUNTER — Other Ambulatory Visit: Payer: Self-pay | Admitting: Family

## 2021-01-01 DIAGNOSIS — G8929 Other chronic pain: Secondary | ICD-10-CM

## 2021-01-01 DIAGNOSIS — M25562 Pain in left knee: Secondary | ICD-10-CM

## 2021-01-01 MED ORDER — MELOXICAM 7.5 MG PO TABS: 7.5000 mg | ORAL_TABLET | Freq: Every day | ORAL | 0 refills | Status: DC | PRN

## 2021-01-09 NOTE — Telephone Encounter (Signed)
Yvonne [name], you may already be working on this patient PA for procedure. I just found this in my Q If she doesn't need PA I will call and schedule this.  Thanks  [employer]

## 2021-01-14 ENCOUNTER — Ambulatory Visit: Payer: Managed Care, Other (non HMO) | Admitting: Nurse Practitioner

## 2021-01-14 ENCOUNTER — Other Ambulatory Visit: Payer: Self-pay

## 2021-01-14 ENCOUNTER — Encounter: Payer: Self-pay | Admitting: Nurse Practitioner

## 2021-01-14 VITALS — BP 132/60 | HR 60 | Temp 98.6°F | Resp 15 | Ht 62.5 in | Wt 229.0 lb

## 2021-01-14 DIAGNOSIS — Z008 Encounter for other general examination: Secondary | ICD-10-CM

## 2021-01-14 DIAGNOSIS — L989 Disorder of the skin and subcutaneous tissue, unspecified: Secondary | ICD-10-CM | POA: Diagnosis not present

## 2021-01-14 DIAGNOSIS — Z0000 Encounter for general adult medical examination without abnormal findings: Secondary | ICD-10-CM

## 2021-01-14 NOTE — Patient Instructions (Signed)
Health Maintenance, Female Adopting a healthy lifestyle and getting preventive care are important in promoting health and wellness. Ask your health care provider about:  The right schedule for you to have regular tests and exams.  Things you can do on your own to prevent diseases and keep yourself healthy. What should I know about diet, weight, and exercise? Eat a healthy diet  Eat a diet that includes plenty of vegetables, fruits, low-fat dairy products, and lean protein.  Do not eat a lot of foods that are high in solid fats, added sugars, or sodium.   Maintain a healthy weight Body mass index (BMI) is used to identify weight problems. It estimates body fat based on height and weight. Your health care provider can help determine your BMI and help you achieve or maintain a healthy weight. Get regular exercise Get regular exercise. This is one of the most important things you can do for your health. Most adults should:  Exercise for at least 150 minutes each week. The exercise should increase your heart rate and make you sweat (moderate-intensity exercise).  Do strengthening exercises at least twice a week. This is in addition to the moderate-intensity exercise.  Spend less time sitting. Even light physical activity can be beneficial. Watch cholesterol and blood lipids Have your blood tested for lipids and cholesterol at 53 years of age, then have this test every 5 years. Have your cholesterol levels checked more often if:  Your lipid or cholesterol levels are high.  You are older than 53 years of age.  You are at high risk for heart disease. What should I know about cancer screening? Depending on your health history and family history, you may need to have cancer screening at various ages. This may include screening for:  Breast cancer.  Cervical cancer.  Colorectal cancer.  Skin cancer.  Lung cancer. What should I know about heart disease, diabetes, and high blood  pressure? Blood pressure and heart disease  High blood pressure causes heart disease and increases the risk of stroke. This is more likely to develop in people who have high blood pressure readings, are of African descent, or are overweight.  Have your blood pressure checked: ? Every 3-5 years if you are 59-59 years of age. ? Every year if you are 63 years old or older. Diabetes Have regular diabetes screenings. This checks your fasting blood sugar level. Have the screening done:  Once every three years after age 58 if you are at a normal weight and have a low risk for diabetes.  More often and at a younger age if you are overweight or have a high risk for diabetes. What should I know about preventing infection? Hepatitis B If you have a higher risk for hepatitis B, you should be screened for this virus. Talk with your health care provider to find out if you are at risk for hepatitis B infection. Hepatitis C Testing is recommended for:  Everyone born from [birth year] through 1963.  Anyone with known risk factors for hepatitis C. Sexually transmitted infections (STIs)  Get screened for STIs, including gonorrhea and chlamydia, if: ? You are sexually active and are younger than 53 years of age. ? You are older than 53 years of age and your health care provider tells you that you are at risk for this type of infection. ? Your sexual activity has changed since you were last screened, and you are at increased risk for chlamydia or gonorrhea. Ask your health care provider  if you are at risk.  Ask your health care provider about whether you are at high risk for HIV. Your health care provider may recommend a prescription medicine to help prevent HIV infection. If you choose to take medicine to prevent HIV, you should first get tested for HIV. You should then be tested every 3 months for as long as you are taking the medicine. Pregnancy  If you are about to stop having your period (premenopausal) and  you may become pregnant, seek counseling before you get pregnant.  Take 400 to 800 micrograms (mcg) of folic acid every day if you become pregnant.  Ask for birth control (contraception) if you want to prevent pregnancy. Osteoporosis and menopause Osteoporosis is a disease in which the bones lose minerals and strength with aging. This can result in bone fractures. If you are 83 years old or older, or if you are at risk for osteoporosis and fractures, ask your health care provider if you should:  Be screened for bone loss.  Take a calcium or vitamin D supplement to lower your risk of fractures.  Be given hormone replacement therapy (HRT) to treat symptoms of menopause. Follow these instructions at home: Lifestyle  Do not use any products that contain nicotine or tobacco, such as cigarettes, e-cigarettes, and chewing tobacco. If you need help quitting, ask your health care provider.  Do not use street drugs.  Do not share needles.  Ask your health care provider for help if you need support or information about quitting drugs. Alcohol use  Do not drink alcohol if: ? Your health care provider tells you not to drink. ? You are pregnant, may be pregnant, or are planning to become pregnant.  If you drink alcohol: ? Limit how much you use to 0-1 drink a day. ? Limit intake if you are breastfeeding.  Be aware of how much alcohol is in your drink. In the [place] one drink equals one 12 oz bottle of beer (355 mL), one 5 oz glass of wine (148 mL), or one 1 oz glass of hard liquor (44 mL). General instructions  Schedule regular health, dental, and eye exams.  Stay current with your vaccines.  Tell your health care provider if: ? You often feel depressed. ? You have ever been abused or do not feel safe at home. Summary  Adopting a healthy lifestyle and getting preventive care are important in promoting health and wellness.  Follow your health care provider's instructions about healthy  diet, exercising, and getting tested or screened for diseases.  Follow your health care provider's instructions on monitoring your cholesterol and blood pressure. This information is not intended to replace advice given to you by your health care provider. Make sure you discuss any questions you have with your health care provider. Document Revised: 11/08/2018 Document Reviewed: 11/08/2018 Elsevier Patient Education  2021 [employer].  Immunization Schedule, 12-10 Years Old  Vaccines are usually given at various ages, according to a schedule. Your health care provider will recommend vaccines for you based on your age, medical history, and lifestyle or other factors such as travel or where you work. You may receive vaccines as individual doses or as more than one vaccine together in one shot (combination vaccines). Talk with your health care provider about the risks and benefits of combination vaccines. Recommended immunizations for 65-62 years old Influenza vaccine  You should get a dose of the influenza vaccine every year. Tetanus, diphtheria, and pertussis vaccine A vaccine that protects against  tetanus, diphtheria, and pertussis is known as the Tdap vaccine. A vaccine that protects against tetanus and diphtheria is known as the Td vaccine.  You should only get the Td vaccine if you have had at least 1 dose of the Tdap vaccine.  You should get 1 dose of the Td or Tdap vaccine every 10 years, or you should get 1 dose of the Tdap vaccine if: ? You have not previously gotten a Tdap vaccine. ? You do not know if you have ever gotten a Tdap vaccine. Zoster vaccine This is also known as the RZV vaccine. You should get 2 doses of the RZV vaccine 2 to 6 months apart. It is important to get the RZV vaccine even if you:  Have had shingles.  Have received the ZVL vaccine, an older version of the RZV vaccine.  Are unsure if you have had chickenpox (varicella). Pneumococcal conjugate  vaccine This is also known as the PCV13 vaccine. You should get the PCV13 vaccine as recommended if you have certain high-risk conditions. These include:  Diabetes.  Chronic conditions of the heart, lungs, or liver.  Conditions that affect the body's disease-fighting system (immune system). Pneumococcal polysaccharide vaccine This is also known as the PPSV23 vaccine. You should get the PPSV23 vaccine as recommended if you have certain high-risk conditions. These include:  Diabetes.  Chronic conditions of the heart, lungs, or liver.  Conditions that affect the immune system. Hepatitis A vaccine This is also known as the HepA vaccine. If you did not get the HepA vaccine previously, you should get it if:  You are at risk for a hepatitis A infection. You may be at risk for infection if you: ? Have chronic liver disease. ? Have HIV or AIDS. ? Are a man who has sex with men. ? Use drugs. ? Are homeless. ? May be exposed to hepatitis A through work. ? Travel to countries where hepatitis A is common. ? Have or will have close contact with someone who was adopted from another country.  You are not at risk for infection but want protection from hepatitis A. Hepatitis B vaccine This is also known as the HepB vaccine. If you did not get the HepB vaccine previously, you should get it if:  You are at risk for hepatitis B infection. You are at risk if you: ? Have chronic liver disease. ? Have HIV or AIDS. ? Have sex with a partner who has hepatitis B, or:  You have multiple sex partners.  You are a man who has sex with men. ? Use drugs. ? May be exposed to hepatitis B through work. ? Live with someone who has hepatitis B. ? Receive dialysis treatment. ? Have diabetes. ? Travel to countries where hepatitis B is common.  You are not at risk of infection but want protection from hepatitis B. Measles, mumps, and rubella vaccine This is also known as the MMR vaccine. You may need to get  the MMR vaccine if you were born in 1957 or later and:  You need to catch up on doses you missed in the past.  You have not been given the vaccine before.  You do not have evidence of immunity (by a blood test). Varicella vaccine This is also known as the VAR vaccine. You may need to get the VAR vaccine if you do not have evidence of immunity (by a blood test) and you may be exposed to varicella through work. This is especially important if you  work in a health care setting. Meningococcal conjugate vaccine This is also known as the MenACWY vaccine. You may need to get the MenACWY vaccine if you:  Have not been given the vaccine before.  Need to catch up on doses you missed in the past. This vaccine is especially important if you:  Do not have a spleen.  Have sickle cell disease.  Have HIV.  Take medicines that suppress your immune system.  Travel to countries where meningococcal disease is common.  Are exposed to Neisseria meningitidis at work. Serogroup B meningococcal vaccine This is also known as the MenB vaccine. You may need to get the MenB vaccine if you:  Have not been given the vaccine before.  Need to catch up on doses you missed in the past. This vaccine is especially important if you:  Do not have a spleen.  Have sickle cell disease.  Take medicines that suppress your immune system.  Are exposed to Neisseria meningitidis at work. Haemophilus influenzae type b vaccine This is also known as the Hib vaccine. Anyone older than 53 years of age is usually not given the Hib vaccine. However, if you have certain high-risk conditions, you may need to get this vaccine. These conditions include:  Not having a spleen.  Having received a stem cell transplant. Before you get a vaccine: Talk with your health care provider about which vaccines are right for you. This is especially important if:  You previously had a reaction after getting a vaccine.  You have a  weakened immune system. You may have a weakened immune system if you: ? Are taking medicines that reduce (suppress) the activity of your immune system. ? Are taking medicines to treat cancer (chemotherapy). ? Have HIV or AIDS.  You work in an environment where you may be exposed to a disease.  You plan to travel outside of the country.  You have a chronic illness, such as heart disease, kidney disease, diabetes, or lung disease. Summary  Before you get a vaccine, tell your health care provider if you have reacted to vaccines in the past or have a condition that weakens your immune system.  At 50-64 years, you should get a dose of the flu vaccine every year and a dose of the Td or Tdap vaccine every 10 years.  You should get 2 doses of the RZV vaccine 2 to 6 months apart.  Depending on your medical history and your risk factors, you may need other vaccines. Ask your health care provider whether you are up to date on all your vaccines. This information is not intended to replace advice given to you by your health care provider. Make sure you discuss any questions you have with your health care provider. Document Revised: 09/11/2019 Document Reviewed: 09/11/2019 Elsevier Patient Education  2021 [employer].  High Triglycerides Eating Plan Triglycerides are a type of fat in the blood. High levels of triglycerides can increase your risk of heart disease and stroke. If your triglyceride levels are high, choosing the right foods can help lower your triglycerides and keep your heart healthy. Work with your health care provider or a diet and nutrition specialist (dietitian) to develop an eating plan that is right for you. What are tips for following this plan? General guidelines  Lose weight, if you are overweight. For most people, losing 5-10 lbs (2-5 kg) helps lower triglyceride levels. A weight-loss plan may include. ? 30 minutes of exercise at least 5 days a week. ? Reducing  the amount of  calories, sugar, and fat you eat.  Eat a wide variety of fresh fruits, vegetables, and whole grains. These foods are high in fiber.  Eat foods that contain healthy fats, such as fatty fish, nuts, seeds, and olive oil.  Avoid foods that are high in added sugar, added salt (sodium), saturated fat, and trans fat.  Avoid low-fiber, refined carbohydrates such as white bread, crackers, noodles, and white rice.  Avoid foods with partially hydrogenated oils (trans fats), such as fried foods or stick margarine.  Limit alcohol intake to no more than 1 drink a day for nonpregnant women and 2 drinks a day for men. One drink equals 12 oz of beer, 5 oz of wine, or 1 oz of hard liquor. Your health care provider may recommend that you drink less depending on your overall health.   Reading food labels  Check food labels for the amount of saturated fat. Choose foods with no or very little saturated fat.  Check food labels for the amount of trans fat. Choose foods with no trans fat.  Check food labels for the amount of cholesterol. Choose foods low in cholesterol. Ask your dietitian how much cholesterol you should have each day.  Check food labels for the amount of sodium. Choose foods with less than 140 milligrams (mg) per serving. Shopping  Buy dairy products labeled as nonfat (skim) or low-fat (1%).  Avoid buying processed or prepackaged foods. These are often high in added sugar, sodium, and fat. Cooking  Choose healthy fats when cooking, such as olive oil or canola oil.  Cook foods using lower fat methods, such as baking, broiling, boiling, or grilling.  Make your own sauces, dressings, and marinades when possible, instead of buying them. Store-bought sauces, dressings, and marinades are often high in sodium and sugar. Meal planning  Eat more home-cooked food and less restaurant, buffet, and fast food.  Eat fatty fish at least 2 times each week. Examples of fatty fish include salmon, trout,  mackerel, tuna, and herring.  If you eat whole eggs, do not eat more than 3 egg yolks per week. What foods are recommended? The items listed may not be a complete list. Talk with your dietitian about what dietary choices are best for you. Grains Whole wheat or whole grain breads, crackers, cereals, and pasta. Unsweetened oatmeal. Bulgur. Barley. Quinoa. Brown rice. Whole wheat flour tortillas. Vegetables Fresh or frozen vegetables. Low-sodium canned vegetables. Fruits All fresh, canned (in natural juice), or frozen fruits. Meats and other protein foods Skinless chicken or [place]. Ground chicken or [place]. Lean cuts of pork, trimmed of fat. Fish and seafood, especially salmon, trout, and herring. Egg whites. Dried beans, peas, or lentils. Unsalted nuts or seeds. Unsalted canned beans. Natural peanut or almond butter. Dairy Low-fat dairy products. Skim or low-fat (1%) milk. Reduced fat (2%) and low-sodium cheese. Low-fat ricotta cheese. Low-fat cottage cheese. Plain, low-fat yogurt. Fats and oils Tub margarine without trans fats. Light or reduced-fat mayonnaise. Light or reduced-fat salad dressings. Avocado. Safflower, olive, sunflower, soybean, and canola oils. What foods are not recommended? The items listed may not be a complete list. Talk with your dietitian about what dietary choices are best for you. Grains White bread. White (regular) pasta. White rice. Cornbread. Bagels. Pastries. Crackers that contain trans fat. Vegetables Creamed or fried vegetables. Vegetables in a cheese sauce. Fruits Sweetened dried fruit. Canned fruit in syrup. Fruit juice. Meats and other protein foods Fatty cuts of meat. Ribs. Chicken wings. Berniece Salines.  Sausage. Bologna. Salami. Chitterlings. Fatback. Hot dogs. Bratwurst. Packaged lunch meats. Dairy Whole or reduced-fat (2%) milk. Half-and-half. Cream cheese. Full-fat or sweetened yogurt. Full-fat cheese. Nondairy creamers. Whipped toppings. Processed cheese or  cheese spreads. Cheese curds. Beverages Alcohol. Sweetened drinks, such as soda, lemonade, fruit drinks, or punches. Fats and oils Butter. Stick margarine. Lard. Shortening. Ghee. Bacon fat. Tropical oils, such as coconut, palm kernel, or palm oils. Sweets and desserts Corn syrup. Sugars. Honey. Molasses. Candy. Jam and jelly. Syrup. Sweetened cereals. Cookies. Pies. Cakes. Donuts. Muffins. Ice cream. Condiments Store-bought sauces, dressings, and marinades that are high in sugar, such as ketchup and barbecue sauce. Summary  High levels of triglycerides can increase the risk of heart disease and stroke. Choosing the right foods can help lower your triglycerides.  Eat plenty of fresh fruits, vegetables, and whole grains. Choose low-fat dairy and lean meats. Eat fatty fish at least twice a week.  Avoid processed and prepackaged foods with added sugar, sodium, saturated fat, and trans fat.  If you need suggestions or have questions about what types of food are good for you, talk with your health care provider or a dietitian. This information is not intended to replace advice given to you by your health care provider. Make sure you discuss any questions you have with your health care provider. Document Revised: 03/19/2020 Document Reviewed: 03/19/2020 Elsevier Patient Education  2021 [employer].  High Cholesterol  High cholesterol is a condition in which the blood has high levels of a white, waxy substance similar to fat (cholesterol). The liver makes all the cholesterol that the body needs. The human body needs small amounts of cholesterol to help build cells. A person gets extra or excess cholesterol from the food that he or she eats. The blood carries cholesterol from the liver to the rest of the body. If you have high cholesterol, deposits (plaques) may build up on the walls of your arteries. Arteries are the blood vessels that carry blood away from your heart. These plaques make the  arteries narrow and stiff. Cholesterol plaques increase your risk for heart attack and stroke. Work with your health care provider to keep your cholesterol levels in a healthy range. What increases the risk? The following factors may make you more likely to develop this condition:  Eating foods that are high in animal fat (saturated fat) or cholesterol.  Being overweight.  Not getting enough exercise.  A family history of high cholesterol (familial hypercholesterolemia).  Use of tobacco products.  Having diabetes. What are the signs or symptoms? There are no symptoms of this condition. How is this diagnosed? This condition may be diagnosed based on the results of a blood test.  If you are older than 53 years of age, your health care provider may check your cholesterol levels every 4-6 years.  You may be checked more often if you have high cholesterol or other risk factors for heart disease. The blood test for cholesterol measures:  "Bad" cholesterol, or LDL cholesterol. This is the main type of cholesterol that causes heart disease. The desired level is less than 100 mg/dL.  "Good" cholesterol, or HDL cholesterol. HDL helps protect against heart disease by cleaning the arteries and carrying the LDL to the liver for processing. The desired level for HDL is 60 mg/dL or higher.  Triglycerides. These are fats that your body can store or burn for energy. The desired level is less than 150 mg/dL.  Total cholesterol. This measures the total amount of  cholesterol in your blood and includes LDL, HDL, and triglycerides. The desired level is less than 200 mg/dL. How is this treated? This condition may be treated with:  Diet changes. You may be asked to eat foods that have more fiber and less saturated fats or added sugar.  Lifestyle changes. These may include regular exercise, maintaining a healthy weight, and quitting use of tobacco products.  Medicines. These are given when diet and  lifestyle changes have not worked. You may be prescribed a statin medicine to help lower your cholesterol levels. Follow these instructions at home: Eating and drinking  Eat a healthy, balanced diet. This diet includes: ? Daily servings of a variety of fresh, frozen, or canned fruits and vegetables. ? Daily servings of whole grain foods that are rich in fiber. ? Foods that are low in saturated fats and trans fats. These include poultry and fish without skin, lean cuts of meat, and low-fat dairy products. ? A variety of fish, especially oily fish that contain omega-3 fatty acids. Aim to eat fish at least 2 times a week.  Avoid foods and drinks that have added sugar.  Use healthy cooking methods, such as roasting, grilling, broiling, baking, poaching, steaming, and stir-frying. Do not fry your food except for stir-frying.   Lifestyle  Get regular exercise. Aim to exercise for a total of 150 minutes a week. Increase your activity level by doing activities such as gardening, walking, and taking the stairs.  Do not use any products that contain nicotine or tobacco, such as cigarettes, e-cigarettes, and chewing tobacco. If you need help quitting, ask your health care provider.   General instructions  Take over-the-counter and prescription medicines only as told by your health care provider.  Keep all follow-up visits as told by your health care provider. This is important. Where to find more information  American Heart Association: www.heart.org  National Heart, Lung, and Blood Institute: https://wilson-eaton.com/ Contact a health care provider if:  You have trouble achieving or maintaining a healthy diet or weight.  You are starting an exercise program.  You are unable to stop smoking. Get help right away if:  You have chest pain.  You have trouble breathing.  You have any symptoms of a stroke. "BE FAST" is an easy way to remember the main warning signs of a stroke: ? B - Balance. Signs  are dizziness, sudden trouble walking, or loss of balance. ? E - Eyes. Signs are trouble seeing or a sudden change in vision. ? F - Face. Signs are sudden weakness or numbness of the face, or the face or eyelid drooping on one side. ? A - Arms. Signs are weakness or numbness in an arm. This happens suddenly and usually on one side of the body. ? S - Speech. Signs are sudden trouble speaking, slurred speech, or trouble understanding what people say. ? T - Time. Time to call emergency services. Write down what time symptoms started.  You have other signs of a stroke, such as: ? A sudden, severe headache with no known cause. ? Nausea or vomiting. ? Seizure. These symptoms may represent a serious problem that is an emergency. Do not wait to see if the symptoms will go away. Get medical help right away. Call your local emergency services (911 in the [place] Do not drive yourself to the hospital. Summary  Cholesterol plaques increase your risk for heart attack and stroke. Work with your health care provider to keep your cholesterol levels in a healthy  range.  Eat a healthy, balanced diet, get regular exercise, and maintain a healthy weight.  Do not use any products that contain nicotine or tobacco, such as cigarettes, e-cigarettes, and chewing tobacco.  Get help right away if you have any symptoms of a stroke. This information is not intended to replace advice given to you by your health care provider. Make sure you discuss any questions you have with your health care provider. Document Revised: 10/15/2019 Document Reviewed: 10/15/2019 Elsevier Patient Education  2021 [employer].

## 2021-01-14 NOTE — Progress Notes (Signed)
Subjective:    Patient ID: Yvonne [name], female    DOB: 09-Nov-1966, 55 y.o.   MRN: [id number]  HPI  [name] is a 55 y.o. female who presents to the [facility] for her annual biometric screening exam. She is employed in the [employer] department and has been there for 7.5 years. She enjoys her work. She reports she has had a good year health wise with the exception of a UTI. She reports that her daughter got married in November and had a wonderful celebration. She has added 2 dogs to her family.  Patient does report that she had an area on her left hand that looked like a wart and used OTC medication that took it off for the most part but now is irritated and wonders if it is coming back.   Past Medical History:  Diagnosis Date  . Allergy   . Arthritis, rheumatoid ([place])   . GERD (gastroesophageal reflux disease)   . Gestational diabetes    patient denies  . Glaucoma   . H/O bronchitis   . Headache    migraine  . History of ovarian cyst   . Hypertension    pre-eclampsia with first child  . 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 Restless leg   . Shingles 2021   Past Surgical History:  Procedure Laterality Date  . CERVICAL POLYPECTOMY N/A 04/11/2017   Procedure: CERVICAL POLYPECTOMY;  Surgeon: Rubie Maid, MD;  Location: [facility];  Service: Gynecology;  Laterality: N/A;  . HYSTEROSCOPY WITH D & C N/A 04/11/2017   Procedure: DILATATION AND CURETTAGE /HYSTEROSCOPY;  Surgeon: Rubie Maid, MD;  Location: [facility];  Service: Gynecology;  Laterality: N/A;  . IUD REMOVAL N/A 05/05/2017   Procedure: INTRAUTERINE DEVICE (IUD) REMOVAL;  Surgeon: Rubie Maid, MD;  Location: [facility];  Service: Gynecology;  Laterality: N/A;  . LAPAROSCOPIC SALPINGO OOPHERECTOMY Bilateral 05/05/2017   Procedure: LAPAROSCOPIC BILATERAL SALPINGO OOPHORECTOMY;  Surgeon: Rubie Maid, MD;  Location: [facility];  Service: Gynecology;  Laterality: Bilateral;  . LAPAROSCOPY N/A 04/11/2017   Procedure:  LAPAROSCOPY DIAGNOSTIC;  Surgeon: Rubie Maid, MD;  Location: [facility];  Service: Gynecology;  Laterality: N/A;  . PARTIAL HYSTERECTOMY     Ovaries and Tubes  . TUBAL LIGATION     Current Outpatient Medications on File Prior to Visit  Medication Sig Dispense Refill  . acetaminophen (TYLENOL) 325 MG tablet Take by mouth.    . Adalimumab (HUMIRA PEN [place]) Inject into the skin.    Marland Kitchen amLODipine (NORVASC) 5 MG tablet Take 1 tablet (5 mg total) by mouth daily. 90 tablet 1  . baclofen (LIORESAL) 10 MG tablet TAKE 1 TABLET BY MOUTH ONCE DAILY AS NEEDED FOR MUSCLE SPASM 30 tablet 3  . benzonatate (TESSALON) 100 MG capsule Take 1 capsule (100 mg total) by mouth 3 (three) times daily as needed for cough. 20 capsule 1  . clindamycin (CLINDAGEL) 1 % gel Apply topically 2 (two) times daily. 30 g 4  . DULoxetine (CYMBALTA) 30 MG capsule Take 1 capsule (30 mg total) by mouth daily. Start AFTER you are weaned off paxil 90 capsule 3  . fexofenadine (ALLEGRA ALLERGY) 180 MG tablet Take 1 tablet (180 mg total) by mouth daily. 90 tablet 1  . gabapentin (NEURONTIN) 100 MG capsule Take 2 capsules (200 mg total) by mouth 3 (three) times daily. 180 capsule 3  . hydroxychloroquine (PLAQUENIL) 200 MG tablet Take 200 mg by mouth 2 (two) times daily.    Marland Kitchen  latanoprost (XALATAN) 0.005 % ophthalmic solution Place 1 drop into both eyes at bedtime.    . Melatonin 5 MG CAPS Take by mouth.    . meloxicam (MOBIC) 7.5 MG tablet Take 1 tablet (7.5 mg total) by mouth daily as needed for pain. With food 90 tablet 0  . montelukast (SINGULAIR) 10 MG tablet Take 1 tablet (10 mg total) by mouth at bedtime. 90 tablet 1  . nortriptyline (PAMELOR) 10 MG capsule Take 1 capsule (10 mg total) by mouth at bedtime. 90 capsule 1  . omeprazole (PRILOSEC) 20 MG capsule Take 1 capsule (20 mg total) by mouth daily. 90 capsule 0  . ondansetron (ZOFRAN ODT) 4 MG disintegrating tablet Take 1 tablet (4 mg total) by mouth every 8 (eight) hours as needed  for nausea or vomiting. 30 tablet 1  . phentermine 37.5 MG capsule Take 1 capsule (37.5 mg total) by mouth every morning. 30 capsule 2  . propranolol (INDERAL) 40 MG tablet Take 1 tablet (40 mg total) by mouth 2 (two) times daily. 60 tablet 2  . SUMAtriptan (IMITREX) 50 MG tablet Take 1 tablet (50 mg total) by mouth once for 1 dose. May repeat in 2 hours if headache persists or recurs. 10 tablet 2  . timolol (TIMOPTIC) 0.25 % ophthalmic solution Place 1 drop into both eyes daily.     No current facility-administered medications on file prior to visit.   Allergies  Allergen Reactions  . Dried Figs [Ficus] Anaphylaxis    Tongue tingling & felt like throat was swelling.    Asencion Islam [Fluticasone Propionate]     Increases eye pressure   . Sulfa Antibiotics Swelling  . Tussionex Pennkinetic Er [Hydrocod Polst-Cpm Polst Er] Itching    Hyperactive    Immunizations: UTD, Covid booster 2/14 Diet/Exercise: going well with healthy diet and losing weight  Review of Systems  Constitutional: Negative for unexpected weight change.  HENT:       Seasonal allergies  Eyes: Negative for discharge, redness and itching.  Respiratory: Negative for cough and shortness of breath.   Cardiovascular: Negative for chest pain and leg swelling.  Gastrointestinal: Negative for abdominal pain.  Genitourinary: Negative for difficulty urinating, dysuria, frequency and pelvic pain.  Musculoskeletal: Positive for arthralgias.       Hx of R/A and DDD. But doing ok today  Skin:       Small irritated area to left hand.  Neurological: Negative for syncope, light-headedness and headaches.  Psychiatric/Behavioral: Negative for confusion. The patient is not nervous/anxious.        Objective: BP 132/60 (BP Location: Right Arm, Patient Position: Sitting, Cuff Size: Normal)   Pulse 60   Temp 98.6 F (37 C) (Temporal)   Resp 15   Ht 5' 2.5" (1.588 m)   Wt 229 lb (103.9 kg)   LMP 05/06/2017 (Exact Date)   SpO2 97%    BMI 41.22 kg/m     Physical Exam Constitutional:      Appearance: Normal appearance. She is obese.  HENT:     Head: Normocephalic.     Right Ear: Tympanic membrane, ear canal and external ear normal.     Left Ear: Tympanic membrane, ear canal and external ear normal.     Nose: Nose normal.     Mouth/Throat:     Mouth: Mucous membranes are moist.  Eyes:     Extraocular Movements: Extraocular movements intact.     Conjunctiva/sclera: Conjunctivae normal.     Pupils: Pupils are  equal, round, and reactive to light.  Neck:     Thyroid: No thyroid mass or thyroid tenderness.     Vascular: No carotid bruit.     Trachea: Trachea normal.  Cardiovascular:     Rate and Rhythm: Normal rate and regular rhythm.     Pulses:          Radial pulses are 2+ on the right side and 2+ on the left side.       Dorsalis pedis pulses are 2+ on the right side and 2+ on the left side.       Posterior tibial pulses are 2+ on the right side.     Heart sounds: No murmur heard.   Pulmonary:     Effort: Pulmonary effort is normal.     Breath sounds: Normal breath sounds and air entry.  Abdominal:     General: Bowel sounds are normal.     Palpations: Abdomen is soft.     Tenderness: There is no abdominal tenderness. There is no right CVA tenderness or left CVA tenderness.  Musculoskeletal:       Hands:     Cervical back: Neck supple. No tenderness. No spinous process tenderness or muscular tenderness. Normal range of motion.     Right lower leg: No edema.     Left lower leg: No edema.     Comments: 1 cm area with mild erythema and scaling noted. No signs of infection.   Lymphadenopathy:     Cervical: No cervical adenopathy.  Skin:    General: Skin is warm and dry.     Findings: Rash present. Rash is scaling.     Comments: There is a 1 cm area to the dorsum of the left hand at the 3rd MCP.   Neurological:     Mental Status: She is alert.     Cranial Nerves: No cranial nerve deficit.     Motor:  Motor function is intact.     Coordination: Romberg sign negative.     Gait: Gait is intact.     Comments: Ambulatory with steady gait. Stands on one foot without difficulty. Straight leg raises without pain. Can bend and touch toes, twist from side to side without pain. Grips are equal, radial pulses 2+.   Psychiatric:        Attention and Perception: Attention normal.        Mood and Affect: Mood normal.        Speech: Speech normal.        Behavior: Behavior normal.        Thought Content: Thought content normal.       Assessment & Plan:  Encounter for other general examination - Plan: Glucose, random, Lipid panel  Encounter for biometric screening - Plan: Glucose, random, Lipid panel  Encounter for screening and preventative care - Plan: Glucose, random, Lipid panel  Skin lesion of hand  Discussed with the patient diet, exercise and immunizations. Patient given opportunity to ask questions. All questioned fully answered. Discussed area to dorsum of the left hand. She will use Cortisone cream and Eucerin cream. If the area that appeared as a wart before comes back she will make an appointment with a dermatologist. Plan to RTC in one year or sooner for any problems.   01/15/2021 11:10 am Patient sent message regarding lab results. Recent Results (from the past 2160 hour(s))  Glucose, random     Status: Abnormal   Collection Time: 01/14/21  8:50 AM  Result Value Ref Range   Glucose 105 (H) 65 - 99 mg/dL  Lipid panel     Status: Abnormal   Collection Time: 01/14/21  8:50 AM  Result Value Ref Range   Cholesterol, Total 244 (H) 100 - 199 mg/dL   Triglycerides 184 (H) 0 - 149 mg/dL   HDL 44 >39 mg/dL   VLDL Cholesterol Cal 34 5 - 40 mg/dL   LDL Chol Calc (NIH) 166 (H) 0 - 99 mg/dL   Chol/HDL Ratio 5.5 (H) 0.0 - 4.4 ratio    Comment:                                   T. Chol/HDL Ratio                                             Men  Women                               1/2  Avg.Risk  3.4    3.3                                   Avg.Risk  5.0    4.4                                2X Avg.Risk  9.6    7.1                                3X Avg.Risk 23.4   11.0    Message sent to patient. Will add information on diet and exercise for elevated cholesterol and triglycerides, however, she needs to discuss with her PCP and decide if medications should  Be added.

## 2021-01-15 ENCOUNTER — Telehealth: Payer: Self-pay

## 2021-01-15 ENCOUNTER — Encounter: Payer: Self-pay | Admitting: Nurse Practitioner

## 2021-01-15 LAB — LIPID PANEL
Chol/HDL Ratio: 5.5 ratio — ABNORMAL HIGH (ref 0.0–4.4)
Cholesterol, Total: 244 mg/dL — ABNORMAL HIGH (ref 100–199)
HDL: 44 mg/dL (ref >39)
LDL Chol Calc (NIH): 166 mg/dL — ABNORMAL HIGH (ref 0–99)
Triglycerides: 184 mg/dL — ABNORMAL HIGH (ref 0–149)
VLDL Cholesterol Cal: 34 mg/dL (ref 5–40)

## 2021-01-15 LAB — GLUCOSE, RANDOM: Glucose: 105 mg/dL — ABNORMAL HIGH (ref 65–99)

## 2021-01-15 NOTE — Telephone Encounter (Signed)
Dr. Holley Raring, her insurance denied the lumbar facets, which is not surprising because she has only been seen here once and that was back in August. They will need more documentation, and proof of other methods tried before approving this. I called her and she wanted to schedule an eval so I have her on your schedule for follow up

## 2021-02-04 ENCOUNTER — Encounter: Payer: Self-pay | Admitting: Family

## 2021-02-05 ENCOUNTER — Encounter: Payer: Self-pay | Admitting: Student in an Organized Health Care Education/Training Program

## 2021-02-05 ENCOUNTER — Ambulatory Visit
Payer: Managed Care, Other (non HMO) | Attending: Student in an Organized Health Care Education/Training Program | Admitting: Student in an Organized Health Care Education/Training Program

## 2021-02-05 ENCOUNTER — Other Ambulatory Visit: Payer: Self-pay

## 2021-02-05 VITALS — BP 141/79 | HR 61 | Temp 97.2°F | Resp 16 | Ht 62.0 in | Wt 229.0 lb

## 2021-02-05 DIAGNOSIS — G894 Chronic pain syndrome: Secondary | ICD-10-CM | POA: Insufficient documentation

## 2021-02-05 DIAGNOSIS — M5412 Radiculopathy, cervical region: Secondary | ICD-10-CM | POA: Insufficient documentation

## 2021-02-05 DIAGNOSIS — M4802 Spinal stenosis, cervical region: Secondary | ICD-10-CM | POA: Diagnosis not present

## 2021-02-05 MED ORDER — KETOROLAC TROMETHAMINE 30 MG/ML IJ SOLN
INTRAMUSCULAR | Status: AC
  Filled 2021-02-05: qty 1

## 2021-02-05 MED ORDER — ORPHENADRINE CITRATE 30 MG/ML IJ SOLN
30.0000 mg | Freq: Once | INTRAMUSCULAR | Status: AC
  Administered 2021-02-05: 30 mg via INTRAMUSCULAR

## 2021-02-05 MED ORDER — ORPHENADRINE CITRATE 30 MG/ML IJ SOLN
INTRAMUSCULAR | Status: AC
  Filled 2021-02-05: qty 2

## 2021-02-05 MED ORDER — KETOROLAC TROMETHAMINE 30 MG/ML IJ SOLN
30.0000 mg | Freq: Once | INTRAMUSCULAR | Status: AC
  Administered 2021-02-05: 30 mg via INTRAMUSCULAR

## 2021-02-05 NOTE — Patient Instructions (Signed)
____________________________________________________________________________________________  Preparing for your procedure (without sedation)  Procedure appointments are limited to planned procedures: . No Prescription Refills. . No disability issues will be discussed. . No medication changes will be discussed.  Instructions: . Oral Intake: Do not eat or drink anything for at least 6 hours prior to your procedure. (Exception: Blood Pressure Medication. See below.) . Transportation: Unless otherwise stated by your physician, you may drive yourself after the procedure. . Blood Pressure Medicine: Do not forget to take your blood pressure medicine with a sip of water the morning of the procedure. If your Diastolic (lower reading)is above 100 mmHg, elective cases will be cancelled/rescheduled. . Blood thinners: These will need to be stopped for procedures. Notify our staff if you are taking any blood thinners. Depending on which one you take, there will be specific instructions on how and when to stop it. . Diabetics on insulin: Notify the staff so that you can be scheduled 1st case in the morning. If your diabetes requires high dose insulin, take only  of your normal insulin dose the morning of the procedure and notify the staff that you have done so. . Preventing infections: Shower with an antibacterial soap the morning of your procedure.  . Build-up your immune system: Take 1000 mg of Vitamin C with every meal (3 times a day) the day prior to your procedure. . Antibiotics: Inform the staff if you have a condition or reason that requires you to take antibiotics before dental procedures. . Pregnancy: If you are pregnant, call and cancel the procedure. . Sickness: If you have a cold, fever, or any active infections, call and cancel the procedure. . Arrival: You must be in the facility at least 30 minutes prior to your scheduled procedure. . Children: Do not bring any children with you. . Dress  appropriately: Bring dark clothing that you would not mind if they get stained. . Valuables: Do not bring any jewelry or valuables.  Reasons to call and reschedule or cancel your procedure: (Following these recommendations will minimize the risk of a serious complication.) . Surgeries: Avoid having procedures within 2 weeks of any surgery. (Avoid for 2 weeks before or after any surgery). . Flu Shots: Avoid having procedures within 2 weeks of a flu shots or . (Avoid for 2 weeks before or after immunizations). . Barium: Avoid having a procedure within 7-10 days after having had a radiological study involving the use of radiological contrast. (Myelograms, Barium swallow or enema study). . Heart attacks: Avoid any elective procedures or surgeries for the initial 6 months after a "Myocardial Infarction" (Heart Attack). . Blood thinners: It is imperative that you stop these medications before procedures. Let us know if you if you take any blood thinner.  . Infection: Avoid procedures during or within two weeks of an infection (including chest colds or gastrointestinal problems). Symptoms associated with infections include: Localized redness, fever, chills, night sweats or profuse sweating, burning sensation when voiding, cough, congestion, stuffiness, runny nose, sore throat, diarrhea, nausea, vomiting, cold or Flu symptoms, recent or current infections. It is specially important if the infection is over the area that we intend to treat. . Heart and lung problems: Symptoms that may suggest an active cardiopulmonary problem include: cough, chest pain, breathing difficulties or shortness of breath, dizziness, ankle swelling, uncontrolled high or unusually low blood pressure, and/or palpitations. If you are experiencing any of these symptoms, cancel your procedure and contact your primary care physician for an evaluation.  Remember:  Regular   Business hours are:  Monday to Thursday 8:00 AM to 4:00  PM  Provider's Schedule: Milinda Pointer, MD:  Procedure days: Tuesday and Thursday 7:30 AM to 4:00 PM  Gillis Santa, MD:  Procedure days: Monday and Wednesday 7:30 AM to 4:00 PM ____________________________________________________________________________________________  Epidural Steroid Injection  An epidural steroid injection is a shot of steroid medicine and numbing medicine that is given into the space between the spinal cord and the bones of the back (epidural space). The shot helps relieve pain caused by an irritated or swollen nerve root. The amount of pain relief you get from the injection depends on what is causing the nerve to be swollen and irritated, and how long your pain lasts. You are more likely to benefit from this injection if your pain is strong and comes on suddenly rather than if you have had long-term (chronic) pain. Tell a health care provider about:  Any allergies you have.  All medicines you are taking, including vitamins, herbs, eye drops, creams, and over-the-counter medicines.  Any problems you or family members have had with anesthetic medicines.  Any blood disorders you have.  Any surgeries you have had.  Any medical conditions you have.  Whether you are pregnant or may be pregnant. What are the risks? Generally, this is a safe procedure. However, problems may occur, including:  Headache.  Bleeding.  Infection.  Allergic reaction to medicines.  Nerve damage. What happens before the procedure? Staying hydrated Follow instructions from your health care provider about hydration, which may include:  Up to 2 hours before the procedure - you may continue to drink clear liquids, such as water, clear fruit juice, black coffee, and plain tea. Eating and drinking restrictions Follow instructions from your health care provider about eating and drinking, which may include:  8 hours before the procedure - stop eating heavy meals or foods, such as  meat, fried foods, or fatty foods.  6 hours before the procedure - stop eating light meals or foods, such as toast or cereal.  6 hours before the procedure - stop drinking milk or drinks that contain milk.  2 hours before the procedure - stop drinking clear liquids. Medicines  You may be given medicines to lower anxiety.  Ask your health care provider about: ? Changing or stopping your regular medicines. This is especially important if you are taking diabetes medicines or blood thinners. ? Taking medicines such as aspirin and ibuprofen. These medicines can thin your blood. Do not take these medicines unless your health care provider tells you to take them. ? Taking over-the-counter medicines, vitamins, herbs, and supplements. General instructions  Ask your health care provider what steps will be taken to prevent infection.  Plan to have a responsible adult take you home from the hospital or clinic.  If you will be going home right after the procedure, plan to have a responsible adult care for you for the time you are told. This is important. What happens during the procedure?  An IV will be inserted into one of your veins.  You will be given one or more of the following: ? A medicine to help you relax (sedative). ? A medicine to numb the area (local anesthetic).  You will be asked to lie on your abdomen or sit.  The injection site will be cleaned.  A needle will be inserted through your skin into the epidural space. This may cause you some discomfort. An X-ray machine will be used to guide the needle  as close as possible to the affected nerve.  A steroid medicine and a local anesthetic will be injected into the epidural space.  The needle and IV will be removed.  A bandage (dressing) will be put over the injection site. The procedure may vary among health care providers and hospitals. What can I expect after the procedure?  Your blood pressure, heart rate, breathing rate,  and blood oxygen level will be monitored until you leave the hospital or clinic.  Your arm or leg may feel weak or numb for a few hours.  The injection site may feel sore. Follow these instructions at home: Injection site care  You may remove the bandage (dressing) after 24 hours.  Check your injection site every day for signs of infection. Check for: ? Redness, swelling, or pain. ? Fluid or blood. ? Warmth. ? Pus or a bad smell. Managing pain, stiffness, and swelling  For 24 hours after the procedure: ? Avoid using heat on the injection site. ? Do not take baths, swim, or use a hot tub until your health care provider approves. Ask your health care provider if you may take showers. You may only be allowed to take sponge baths.   If directed, put ice on the injection site. To do this: ? Put ice in a plastic bag. ? Place a towel between your skin and the bag. ? Leave the ice on for 20 minutes, 2-3 times a day.   Activity  If you were given a sedative during the procedure, it can affect you for several hours. Do not drive or operate machinery until your health care provider says that it is safe.  Return to your normal activities as told by your health care provider. Ask your health care provider what activities are safe for you. General instructions  Take over-the-counter and prescription medicines only as told by your health care provider.  Drink enough fluid to keep your urine pale yellow.  Keep all follow-up visits as told by your health care provider. This is important. Contact a health care provider if:  You have any of these signs of infection: ? Redness, swelling, or pain around your injection site. ? Fluid or blood coming from your injection site. ? Warmth coming from your injection site. ? Pus or a bad smell coming from your injection site. ? A fever.  You continue to have pain and soreness around the injection site, even after taking over-the-counter pain  medicine.  You have severe, sudden, or lasting nausea or vomiting. Get help right away if:  You have severe pain at the injection site that is not relieved by medicines.  You develop a severe headache or a stiff neck.  You become sensitive to light.  You have any new numbness or weakness in your legs or arms.  You lose control of your bladder or bowel movements.  You have trouble breathing. Summary  An epidural steroid injection is a shot of steroid medicine and numbing medicine that is given into the epidural space.  The shot helps relieve pain caused by an irritated or swollen nerve root.  You are more likely to benefit from this injection if your pain is strong and comes on suddenly rather than if you have had chronic pain. This information is not intended to replace advice given to you by your health care provider. Make sure you discuss any questions you have with your health care provider. Document Revised: 03/14/2020 Document Reviewed: 05/28/2019 Elsevier Patient Education  2021  [employer].

## 2021-02-05 NOTE — Progress Notes (Signed)
PROVIDER NOTE: Information contained herein reflects review and annotations entered in association with encounter. Interpretation of such information and data should be left to medically-trained personnel. Information provided to patient can be located elsewhere in the medical record under "Patient Instructions". Document created using STT-dictation technology, any transcriptional errors that may result from process are unintentional.    Patient: Yvonne [name]  Service Category: E/M  Provider: Gillis Santa, MD  DOB: Mar 29, 1966  DOS: 02/05/2021  Specialty: Interventional Pain Management  MRN: [id number]  Setting: Ambulatory outpatient  PCP: Burnard Hawthorne, FNP  Type: Established Patient    Referring Provider: Burnard Hawthorne, FNP  Location: Office  Delivery: Face-to-face     HPI  Yvonne [name], a 55 y.o. year old female, is here today because of her Cervical radicular pain [M54.12]. Yvonne [name] primary complain today is Neck Pain (Bilateral cervical)  Pain Assessment: Severity of Chronic pain is reported as a 8 /10. Location: Neck Left,Right/into shoulders and arms. Onset: More than a month ago. Quality: Discomfort,Constant,Tingling,Numbness. Timing: Constant. Modifying factor(s): nothing currently. Vitals:  height is 5' 2"  (1.575 m) and weight is 229 lb (103.9 kg). Her temporal temperature is 97.2 F (36.2 C) (abnormal). Her blood pressure is 141/79 (abnormal) and her pulse is 61. Her respiration is 16 and oxygen saturation is 100%.   Reason for encounter: worsening of previously known (established) problem    Patient presents for worsening neck pain that radiates into bilateral shoulders and bilateral arms in a dermatomal fashion.  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  Her MRI was reviewed with her in great detail, results of which are below.  Patient has failed physical therapy as well as conservative  management with medications that include gabapentin, nortriptyline, acetaminophen.  At this point, we discussed performing a cervical epidural steroid injection for cervical radicular pain flare.  Risks and benefits of this were reviewed in great detail and patient would like to proceed.   ROS  Constitutional: Denies any fever or chills Gastrointestinal: No reported hemesis, hematochezia, vomiting, or acute GI distress Musculoskeletal: Neck pain, bilateral shoulder and arm pain Neurological: Upper extremity numbness and tingling  Medication Review  Adalimumab, DULoxetine, Melatonin, SUMAtriptan, acetaminophen, amLODipine, baclofen, clindamycin, fexofenadine, gabapentin, hydroxychloroquine, latanoprost, meloxicam, montelukast, nortriptyline, omeprazole, ondansetron, phentermine, propranolol, and timolol  History Review  Allergy: Yvonne [name] is allergic to dried figs [ficus], flonase [fluticasone propionate], sulfa antibiotics, and tussionex pennkinetic er [hydrocod polst-cpm polst er]. Drug: Yvonne [name]  reports no history of drug use. Alcohol:  reports no history of alcohol use. Tobacco:  reports that she quit smoking about 20 years ago. Her smoking use included cigarettes. She has a 6.00 pack-year smoking history. She has never used smokeless tobacco. Social: [name]  reports that she quit smoking about 20 years ago. Her smoking use included cigarettes. She has a 6.00 pack-year smoking history. She has never used smokeless tobacco. She reports that she does not drink alcohol and does not use drugs. 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 Surgical: Yvonne [name]  has a past surgical history that includes Tubal ligation; laparoscopy (N/A,  04/11/2017); Hysteroscopy with D & C (N/A, 04/11/2017); Cervical polypectomy (N/A, 04/11/2017); Laparoscopic salpingo oophorectomy (Bilateral, 05/05/2017); IUD removal (N/A, 05/05/2017); and Partial hysterectomy.  Family: family history includes Alcohol abuse in her father; Bipolar disorder in her mother; Breast cancer in her brother; Diabetes in her paternal grandmother; Hypertension in her brother, father, and maternal grandmother; Schizophrenia in her mother; Seizures in her paternal grandmother.  Laboratory Chemistry Profile   Renal Lab Results  Component Value Date   BUN 17 04/18/2020   CREATININE 0.69 04/18/2020   BCR 25 (H) 04/18/2020   GFRAA 117 04/18/2020   GFRNONAA 101 04/18/2020     Hepatic Lab Results  Component Value Date   AST 22 04/18/2020   ALT 20 04/18/2020   ALBUMIN 4.3 04/18/2020   ALKPHOS 109 04/18/2020   LIPASE 23 12/06/2016     Electrolytes Lab Results  Component Value Date   NA 139 04/18/2020   K 5.0 04/18/2020   CL 101 04/18/2020   CALCIUM 9.8 04/18/2020   PHOS 4.1 05/03/2018     Bone Lab Results  Component Value Date   VD25OH 35.8 12/29/2017     Inflammation (CRP: Acute Phase) (ESR: Chronic Phase) Lab Results  Component Value Date   CRP 3 09/04/2020   ESRSEDRATE 47 (H) 03/05/2019       Note: Above Lab results reviewed.  Recent Imaging Review  MM Digital Screening CLINICAL DATA:  Screening.  EXAM: DIGITAL SCREENING BILATERAL MAMMOGRAM WITH CAD  COMPARISON:  None.  ACR Breast Density Category a: The breast tissue is almost entirely fatty.  FINDINGS: There are no findings suspicious for malignancy. The images were evaluated with computer-aided detection.  IMPRESSION: No mammographic evidence of malignancy. A result letter of this screening mammogram will be mailed directly to the patient.  RECOMMENDATION: Screening mammogram in one year. (Code:SM-B-01Y)  BI-RADS CATEGORY  1: Negative.  Electronically Signed   By: Nolon Nations  M.D.   On: 12/25/2020 08:16 Note: Reviewed        Physical Exam  General appearance: Well nourished, well developed, and well hydrated. In no apparent acute distress Mental status: Alert, oriented x 3 (person, place, & time)       Respiratory: No evidence of acute respiratory distress Eyes: PERLA Vitals: BP (!) 141/79 (BP Location: Right Arm, Patient Position: Sitting, Cuff Size: Large)   Pulse 61   Temp (!) 97.2 F (36.2 C) (Temporal)   Resp 16   Ht 5' 2"  (1.575 m)   Wt 229 lb (103.9 kg)   LMP 05/06/2017 (Exact Date)   SpO2 100%   BMI 41.88 kg/m  BMI: Estimated body mass index is 41.88 kg/m as calculated from the following:   Height as of this encounter: 5' 2"  (1.575 m).   Weight as of this encounter: 229 lb (103.9 kg). Ideal: Ideal body weight: 50.1 kg (110 lb 7.2 oz) Adjusted ideal body weight: 71.6 kg (157 lb 13.9 oz)  Respiratory: No evidence of acute respiratory distress  Cervical Spine Exam  Skin & Axial Inspection: No masses, redness, edema, swelling, or associated skin lesions Alignment: Symmetrical Functional ROM: Pain restricted ROM      Stability: No instability detected Muscle Tone/Strength: Functionally intact. No obvious neuro-muscular anomalies detected. Sensory (Neurological): Dermatomal pain pattern Palpation: No palpable anomalies             Positive Spurling's bilaterally       Upper Extremity (UE) Exam    Side: Right upper extremity  Side: Left upper extremity   Skin & Extremity Inspection: Skin color, temperature, and hair growth are WNL. No peripheral edema or cyanosis. No masses, redness, swelling, asymmetry, or  associated skin lesions. No contractures.  Skin & Extremity Inspection: Skin color, temperature, and hair growth are WNL. No peripheral edema or cyanosis. No masses, redness, swelling, asymmetry, or associated skin lesions. No contractures.   Functional ROM: Unrestricted ROM          Functional ROM: Unrestricted ROM           Muscle  Tone/Strength: Functionally intact. No obvious neuro-muscular anomalies detected.   Muscle Tone/Strength: Functionally intact. No obvious neuro-muscular anomalies detected.   Sensory (Neurological): Neurogenic pain pattern          Sensory (Neurological): Neurogenic pain pattern           Palpation: No palpable anomalies              Palpation: No palpable anomalies               Provocative Test(s):  Phalen's test: deferred Tinel's test: deferred Apley's scratch test (touch opposite shoulder):  Action 1 (Across chest):  Decreased   Provocative Test(s):  Phalen's test: deferred Tinel's test: deferred Apley's scratch test (touch opposite shoulder):  Action 1 (Across chest):  Decreased       Assessment   Status Diagnosis  Having a Flare-up Controlled Controlled 1. Cervical radicular pain   2. Foraminal stenosis of cervical region   3. Chronic pain syndrome      Updated Problems: Problem  Foraminal Stenosis of Cervical Region    Plan of Care  Ms. Mayelin E Mainer has a current medication list which includes the following long-term medication(s): adalimumab, amlodipine, duloxetine, fexofenadine, gabapentin, montelukast, nortriptyline, omeprazole, phentermine, propranolol, and sumatriptan.  Pharmacotherapy (Medications Ordered): Meds ordered this encounter  Medications  . orphenadrine (NORFLEX) injection 30 mg  . ketorolac (TORADOL) 30 MG/ML injection 30 mg   Orders:  Orders Placed This Encounter  Procedures  . Cervical Epidural Injection    Level(s): C7-T1 Laterality: TBD Purpose: Diagnostic/Therapeutic Indication(s): Radiculitis and cervicalgia associater with cervical degenerative disc disease.    Standing Status:   Future    Standing Expiration Date:   03/08/2021    Scheduling Instructions:     Procedure: Cervical Epidural Steroid Injection/Block     Sedation: Patient's choice.     Timeframe: As soon as schedule allows    Order Specific Question:   Where  will this procedure be performed?    Answer:   [facility]    Comments:   [facility]   Follow-up plan:   Return in about 2 weeks (around 02/19/2021) for C-ESI with sedation ASAP.   Recent Visits No visits were found meeting these conditions. Showing recent visits within past 90 days and meeting all other requirements Today's Visits Date Type Provider Dept  02/05/21 Office Visit Gillis Santa, MD [facility]  Showing today's visits and meeting all other requirements Future Appointments No visits were found meeting these conditions. Showing future appointments within next 90 days and meeting all other requirements  I discussed the assessment and treatment plan with the patient. The patient was provided an opportunity to ask questions and all were answered. The patient agreed with the plan and demonstrated an understanding of the instructions.  Patient advised to call back or seek an in-person evaluation if the symptoms or condition worsens.  Duration of encounter: 49mnutes.  Note by: BGillis Santa MD Date: 02/05/2021; Time: 12:02 PM

## 2021-02-05 NOTE — Progress Notes (Signed)
Safety precautions to be maintained throughout the outpatient stay will include: orient to surroundings, keep bed in low position, maintain call bell within reach at all times, provide assistance with transfer out of bed and ambulation.  

## 2021-02-06 ENCOUNTER — Encounter: Payer: Self-pay | Admitting: Family

## 2021-02-06 ENCOUNTER — Encounter: Payer: Self-pay | Admitting: Student in an Organized Health Care Education/Training Program

## 2021-02-06 ENCOUNTER — Other Ambulatory Visit: Payer: Self-pay

## 2021-02-06 DIAGNOSIS — R42 Dizziness and giddiness: Secondary | ICD-10-CM

## 2021-02-06 MED ORDER — FEXOFENADINE HCL 180 MG PO TABS: 180.0000 mg | ORAL_TABLET | Freq: Every day | ORAL | 1 refills | Status: DC

## 2021-02-10 ENCOUNTER — Encounter: Payer: Managed Care, Other (non HMO) | Admitting: Obstetrics and Gynecology

## 2021-02-11 ENCOUNTER — Ambulatory Visit (INDEPENDENT_AMBULATORY_CARE_PROVIDER_SITE_OTHER): Payer: Managed Care, Other (non HMO) | Admitting: Obstetrics and Gynecology

## 2021-02-11 ENCOUNTER — Other Ambulatory Visit: Payer: Self-pay

## 2021-02-11 ENCOUNTER — Encounter: Payer: Self-pay | Admitting: Obstetrics and Gynecology

## 2021-02-11 VITALS — BP 136/81 | HR 63 | Ht 62.0 in | Wt 230.2 lb

## 2021-02-11 DIAGNOSIS — E785 Hyperlipidemia, unspecified: Secondary | ICD-10-CM | POA: Diagnosis not present

## 2021-02-11 DIAGNOSIS — Z7689 Persons encountering health services in other specified circumstances: Secondary | ICD-10-CM

## 2021-02-11 DIAGNOSIS — M25562 Pain in left knee: Secondary | ICD-10-CM

## 2021-02-11 DIAGNOSIS — I10 Essential (primary) hypertension: Secondary | ICD-10-CM | POA: Diagnosis not present

## 2021-02-11 DIAGNOSIS — Z6841 Body Mass Index (BMI) 40.0 and over, adult: Secondary | ICD-10-CM

## 2021-02-11 DIAGNOSIS — G8929 Other chronic pain: Secondary | ICD-10-CM

## 2021-02-11 DIAGNOSIS — M25561 Pain in right knee: Secondary | ICD-10-CM

## 2021-02-11 NOTE — Progress Notes (Signed)
Pt present for weight management. Pt stated that she was doing well. Pt's last visit 11/04/2020 wt 232 lbs. Pt denies any side effects from phentermine.

## 2021-02-11 NOTE — Progress Notes (Incomplete)
GYNECOLOGY PROGRESS NOTE  Subjective:    Patient ID: Yvonne [name], female    DOB: 03-May-1966, 55 y.o.   MRN: [id number]  HPI  Patient is a 55 y.o. female who presents for 3 month weight management follow up. She initiated use of Phentermine almost 9 months ago. Only began losing weight after stopping Paxil 6 months ago. Reported improvement in her mobility once she began taking her Humara, but then had to discontinue due to diagnosis of COVID in January.   During February she reports that she had ingrown toenails and other footwork done.  Has not been very physically active since then. Just resumed her Humara this month.    Current interventions:  1. Diet - Is still trying to eat fairly healthy, but does admit to eating chicken fried steak sandwiches on occasion.  Is having some issues with cravings again.  2. Activity - no formal exercise due to her joint issues and holding of her Humara. Medication.  Has a [employer] (noted last visit as well), but has not had started participating in  3. Reports bowel movements are normal.    The following portions of the patient's history were reviewed and updated as appropriate: allergies, current medications, past family history, past medical history, past social history, past surgical history and problem list.   Current Outpatient Medications on File Prior to Visit  Medication Sig Dispense Refill  . acetaminophen (TYLENOL) 325 MG tablet Take by mouth.    . Adalimumab (HUMIRA PEN [place]) Inject into the skin.    Marland Kitchen amLODipine (NORVASC) 5 MG tablet Take 1 tablet (5 mg total) by mouth daily. 90 tablet 1  . baclofen (LIORESAL) 10 MG tablet TAKE 1 TABLET BY MOUTH ONCE DAILY AS NEEDED FOR MUSCLE SPASM 30 tablet 3  . clindamycin (CLINDAGEL) 1 % gel Apply topically 2 (two) times daily. 30 g 4  . DULoxetine (CYMBALTA) 30 MG capsule Take 1 capsule (30 mg total) by mouth daily. Start AFTER you are weaned off paxil 90 capsule 3  . fexofenadine (ALLEGRA  ALLERGY) 180 MG tablet Take 1 tablet (180 mg total) by mouth daily. 90 tablet 1  . gabapentin (NEURONTIN) 100 MG capsule Take 2 capsules (200 mg total) by mouth 3 (three) times daily. 180 capsule 3  . hydroxychloroquine (PLAQUENIL) 200 MG tablet Take 200 mg by mouth 2 (two) times daily.    Marland Kitchen latanoprost (XALATAN) 0.005 % ophthalmic solution Place 1 drop into both eyes at bedtime.    . Melatonin 5 MG CAPS Take by mouth.    . meloxicam (MOBIC) 7.5 MG tablet Take 1 tablet (7.5 mg total) by mouth daily as needed for pain. With food 90 tablet 0  . montelukast (SINGULAIR) 10 MG tablet Take 1 tablet (10 mg total) by mouth at bedtime. 90 tablet 1  . nortriptyline (PAMELOR) 10 MG capsule Take 1 capsule (10 mg total) by mouth at bedtime. 90 capsule 1  . omeprazole (PRILOSEC) 20 MG capsule Take 1 capsule (20 mg total) by mouth daily. 90 capsule 0  . ondansetron (ZOFRAN ODT) 4 MG disintegrating tablet Take 1 tablet (4 mg total) by mouth every 8 (eight) hours as needed for nausea or vomiting. 30 tablet 1  . phentermine 37.5 MG capsule Take 1 capsule (37.5 mg total) by mouth every morning. 30 capsule 2  . propranolol (INDERAL) 40 MG tablet Take 1 tablet (40 mg total) by mouth 2 (two) times daily. 60 tablet 2  . SUMAtriptan (IMITREX) 50  MG tablet Take 1 tablet (50 mg total) by mouth once for 1 dose. May repeat in 2 hours if headache persists or recurs. 10 tablet 2  . timolol (TIMOPTIC) 0.25 % ophthalmic solution Place 1 drop into both eyes daily.     No current facility-administered medications on file prior to visit.     Review of Systems Pertinent items noted in HPI and remainder of comprehensive ROS otherwise negative.   Objective:    Vitals with BMI 02/11/2021 02/05/2021 01/14/2021  Height 5\' 2"  5\' 2"  5' 2.5"  Weight 230 lbs 3 oz 229 lbs 229 lbs  BMI 42.09 02.23 36.12  Systolic 244 975 300  Diastolic 81 79 60  Pulse 63 61 60    General appearance: alert and no distress Abdomen: soft, non-tender.   Waist circumference 48 in.    Labs:  No new labs  Assessment:   Weight management Obesity, Body mass index is 42.1 kg/m. Dyslipidemia Essential HTN  Plan:   1. Patient reports that she is now finally losing weight on the medication after discontinuation of her Paxil. Also able to start exercising more as her joint pain has improved with use of Humira.  Discussed risks vs benefits of continued use of medication as patient has been on medication for total of ~ 5-6 months, but only has note effects in last 3 months after stopping other medications that interfered with weight loss. Will give another 3-4 months and reassess. Patient plans to begin exercising (water aerobics).  2. Chronic HTN - currently managed, is still ok to continue on medication.  3. Dyslipidemia - will recheck levels at next visit if significant weight loss noted.    RTC in 3 months.    Rubie Maid, MD [facility]

## 2021-02-11 NOTE — Progress Notes (Unsigned)
GYNECOLOGY PROGRESS NOTE  Subjective:    Patient ID: Yvonne [name], female    DOB: 1966-11-28, 55 y.o.   MRN: [id number]  HPI  Patient is a 55 y.o. female who presents for 3 month weight management follow up. She initiated use of Phentermine almost 9 months ago. Only began losing weight after stopping Paxil 6 months ago. Reported improvement in her mobility once she began taking her Humara, but then had to discontinue due to diagnosis of COVID in January.   During February she reports that she had ingrown toenails and other footwork done.  Has not been very physically active since then. Just resumed her Humara this month.    Current interventions:  1. Diet - Is still trying to eat fairly healthy, but does admit to eating chicken fried steak sandwiches on occasion.  Is having some issues with cravings again.  2. Activity - no formal exercise due to her joint issues and holding of her Humara. Medication.  Has a [employer] (noted last visit as well), but has not had started participating in exercise yet.  3. Reports bowel movements are normal.    The following portions of the patient's history were reviewed and updated as appropriate: allergies, current medications, past family history, past medical history, past social history, past surgical history and problem list.   Current Outpatient Medications on File Prior to Visit  Medication Sig Dispense Refill  . acetaminophen (TYLENOL) 325 MG tablet Take by mouth.    . Adalimumab (HUMIRA PEN [place]) Inject into the skin.    Marland Kitchen amLODipine (NORVASC) 5 MG tablet Take 1 tablet (5 mg total) by mouth daily. 90 tablet 1  . baclofen (LIORESAL) 10 MG tablet TAKE 1 TABLET BY MOUTH ONCE DAILY AS NEEDED FOR MUSCLE SPASM 30 tablet 3  . clindamycin (CLINDAGEL) 1 % gel Apply topically 2 (two) times daily. 30 g 4  . DULoxetine (CYMBALTA) 30 MG capsule Take 1 capsule (30 mg total) by mouth daily. Start AFTER you are weaned off paxil 90 capsule 3  .  fexofenadine (ALLEGRA ALLERGY) 180 MG tablet Take 1 tablet (180 mg total) by mouth daily. 90 tablet 1  . gabapentin (NEURONTIN) 100 MG capsule Take 2 capsules (200 mg total) by mouth 3 (three) times daily. 180 capsule 3  . hydroxychloroquine (PLAQUENIL) 200 MG tablet Take 200 mg by mouth 2 (two) times daily.    Marland Kitchen latanoprost (XALATAN) 0.005 % ophthalmic solution Place 1 drop into both eyes at bedtime.    . Melatonin 5 MG CAPS Take by mouth.    . meloxicam (MOBIC) 7.5 MG tablet Take 1 tablet (7.5 mg total) by mouth daily as needed for pain. With food 90 tablet 0  . montelukast (SINGULAIR) 10 MG tablet Take 1 tablet (10 mg total) by mouth at bedtime. 90 tablet 1  . nortriptyline (PAMELOR) 10 MG capsule Take 1 capsule (10 mg total) by mouth at bedtime. 90 capsule 1  . omeprazole (PRILOSEC) 20 MG capsule Take 1 capsule (20 mg total) by mouth daily. 90 capsule 0  . ondansetron (ZOFRAN ODT) 4 MG disintegrating tablet Take 1 tablet (4 mg total) by mouth every 8 (eight) hours as needed for nausea or vomiting. 30 tablet 1  . phentermine 37.5 MG capsule Take 1 capsule (37.5 mg total) by mouth every morning. 30 capsule 2  . propranolol (INDERAL) 40 MG tablet Take 1 tablet (40 mg total) by mouth 2 (two) times daily. 60 tablet 2  . SUMAtriptan (  IMITREX) 50 MG tablet Take 1 tablet (50 mg total) by mouth once for 1 dose. May repeat in 2 hours if headache persists or recurs. 10 tablet 2  . timolol (TIMOPTIC) 0.25 % ophthalmic solution Place 1 drop into both eyes daily.     No current facility-administered medications on file prior to visit.     Review of Systems Pertinent items noted in HPI and remainder of comprehensive ROS otherwise negative.   Objective:    Vitals with BMI 02/11/2021 02/05/2021 01/14/2021  Height 5\' 2"  5\' 2"  5' 2.5"  Weight 230 lbs 3 oz 229 lbs 229 lbs  BMI 42.09 14.43 15.40  Systolic 086 761 950  Diastolic 81 79 60  Pulse 63 61 60    General appearance: alert and no  distress Abdomen: soft, non-tender.  Waist circumference 48 in.    Labs:  No new labs  Assessment:   Weight management Obesity, Body mass index is 42.1 kg/m. Dyslipidemia Essential HTN  Plan:   1. Patient has been on Phentermine for a total of 9 months, with weight loss noted within the last 6 months.  It appears that patient has lost some motivation when it comes to her managing her diet. Also reports some difficulties with physical activity but is finally able to resume her Humara. At this time, I would advised to discontinue the Phentermine as she has not had a significant reduction in weight loss.. Has lost ~ 19 lbs in the past 9 months of use of the medication. Recommend cessation of the medication at this time. Can consider other medications for use (I.e.  Saxenda, Contrave, Wegovy if covered by insurance). Also will refer to medical weight management for better dietary and non-medical assistance with weight management.  2. Patient again notes that she will start exercising (water aerobics) at the [facility] soon.  3. Chronic HTN - currently managed, is still ok to continue on medication.  4. Dyslipidemia - will need to recheck at annual exam.    A total of 15 minutes were spent face-to-face with the patient during this encounter and over half of that time dealt with counseling and coordination of care.   Rubie Maid, MD [facility]

## 2021-02-12 NOTE — Patient Instructions (Signed)
Preventing Unhealthy Weight Gain, Adult Staying at a healthy weight is important to your overall health. When fat builds up in your body, you may become overweight or obese. Being overweight or obese increases your risk of developing certain health problems, such as heart disease, diabetes, sleeping problems, joint problems, and some types of cancer. Unhealthy weight gain is often the result of making unhealthy food choices or not getting enough exercise. You can make changes to your lifestyle to prevent obesity and stay as healthy as possible. What nutrition changes can be made?  Eat only as much as your body needs. To do this: ? Pay attention to signs that you are hungry or full. Stop eating as soon as you feel full. ? If you feel hungry, try drinking water first before eating. Drink enough water so your urine is clear or pale yellow. ? Eat smaller portions. Pay attention to portion sizes when eating out. ? Look at serving sizes on food labels. Most foods contain more than one serving per container. ? Eat the recommended number of calories for your gender and activity level. For most active people, a daily total of 2,000 calories is appropriate. If you are trying to lose weight or are not very active, you may need to eat fewer calories. Talk with your health care provider or a diet and nutrition specialist (dietitian) about how many calories you need each day.  Choose healthy foods, such as: ? Fruits and vegetables. At each meal, try to fill at least half of your plate with fruits and vegetables. ? Whole grains, such as whole-wheat bread, brown rice, and quinoa. ? Lean meats, such as chicken or fish. ? Other healthy proteins, such as beans, eggs, or tofu. ? Healthy fats, such as nuts, seeds, fatty fish, and olive oil. ? Low-fat or fat-free dairy products.  Check food labels, and avoid food and drinks that: ? Are high in calories. ? Have added sugar. ? Are high in sodium. ? Have saturated  fats or trans fats.  Cook foods in healthier ways, such as by baking, broiling, or grilling.  Make a meal plan for the week, and shop with a grocery list to help you stay on track with your purchases. Try to avoid going to the grocery store when you are hungry.  When grocery shopping, try to shop around the outside of the store first, where the fresh foods are. Doing this helps you to avoid prepackaged foods, which can be high in sugar, salt (sodium), and fat.   What lifestyle changes can be made?  Exercise for 30 or more minutes on 5 or more days each week. Exercising may include brisk walking, yard work, biking, running, swimming, and team sports like basketball and soccer. Ask your health care provider which exercises are safe for you.  Do muscle-strengthening activities, such as lifting weights or using resistance bands, on 2 or more days a week.  Do not use any products that contain nicotine or tobacco, such as cigarettes and e-cigarettes. If you need help quitting, ask your health care provider.  Limit alcohol intake to no more than 1 drink a day for nonpregnant women and 2 drinks a day for men. One drink equals 12 oz of beer, 5 oz of wine, or 1 oz of hard liquor.  Try to get 7-9 hours of sleep each night.   What other changes can be made?  Keep a food and activity journal to keep track of: ? What you ate and how  many calories you had. Remember to count the calories in sauces, dressings, and side dishes. ? Whether you were active, and what exercises you did. ? Your calorie, weight, and activity goals.  Check your weight regularly. Track any changes. If you notice you have gained weight, make changes to your diet or activity routine.  Avoid taking weight-loss medicines or supplements. Talk to your health care provider before starting any new medicine or supplement.  Talk to your health care provider before trying any new diet or exercise plan. Why are these changes  important? Eating healthy, staying active, and having healthy habits can help you to prevent obesity. Those changes also:  Help you manage stress and emotions.  Help you connect with friends and family.  Improve your self-esteem.  Improve your sleep.  Prevent long-term health problems. What can happen if changes are not made? Being obese or overweight can cause you to develop joint or bone problems, which can make it hard for you to stay active or do activities you enjoy. Being obese or overweight also puts stress on your heart and lungs and can lead to health problems like diabetes, heart disease, and some cancers. Where to find more information Talk with your health care provider or a dietitian about healthy eating and healthy lifestyle choices. You may also find information from:  U.[place]. Department of Agriculture, MyPlate: FormerBoss.no  American Heart Association: www.heart.org  Centers for Disease Control and Prevention: http://www.wolf.info/ Summary  Staying at a healthy weight is important to your overall health. It helps you to prevent certain diseases and health problems, such as heart disease, diabetes, joint problems, sleep disorders, and some types of cancer.  Being obese or overweight can cause you to develop joint or bone problems, which can make it hard for you to stay active or do activities you enjoy.  You can prevent unhealthy weight gain by eating a healthy diet, exercising regularly, not smoking, limiting alcohol, and getting enough sleep.  Talk with your health care provider or a dietitian for guidance about healthy eating and healthy lifestyle choices. This information is not intended to replace advice given to you by your health care provider. Make sure you discuss any questions you have with your health care provider. Document Revised: 03/13/2020 Document Reviewed: 03/13/2020 Elsevier Patient Education  2021 [employer].

## 2021-02-16 ENCOUNTER — Encounter: Payer: Self-pay | Admitting: Student in an Organized Health Care Education/Training Program

## 2021-02-23 ENCOUNTER — Encounter: Payer: Self-pay | Admitting: Student in an Organized Health Care Education/Training Program

## 2021-02-23 ENCOUNTER — Other Ambulatory Visit: Payer: Self-pay

## 2021-02-23 ENCOUNTER — Ambulatory Visit (HOSPITAL_BASED_OUTPATIENT_CLINIC_OR_DEPARTMENT_OTHER): Payer: Managed Care, Other (non HMO) | Admitting: Student in an Organized Health Care Education/Training Program

## 2021-02-23 ENCOUNTER — Ambulatory Visit
Admission: RE | Admit: 2021-02-23 | Discharge: 2021-02-23 | Disposition: A | Discharge status: Home / Self Care | Payer: Managed Care, Other (non HMO) | Source: Ambulatory Visit | Attending: Student in an Organized Health Care Education/Training Program | Admitting: Student in an Organized Health Care Education/Training Program

## 2021-02-23 DIAGNOSIS — M5412 Radiculopathy, cervical region: Secondary | ICD-10-CM

## 2021-02-23 DIAGNOSIS — M4802 Spinal stenosis, cervical region: Secondary | ICD-10-CM

## 2021-02-23 DIAGNOSIS — G894 Chronic pain syndrome: Secondary | ICD-10-CM | POA: Insufficient documentation

## 2021-02-23 MED ORDER — FENTANYL CITRATE (PF) 100 MCG/2ML IJ SOLN
25.0000 ug | INTRAMUSCULAR | Status: DC | PRN
  Filled 2021-02-23: qty 2

## 2021-02-23 MED ORDER — LIDOCAINE HCL 2 % IJ SOLN
20.0000 mL | Freq: Once | INTRAMUSCULAR | Status: AC
  Administered 2021-02-23: 400 mg
  Filled 2021-02-23: qty 20

## 2021-02-23 MED ORDER — DEXAMETHASONE SODIUM PHOSPHATE 10 MG/ML IJ SOLN
10.0000 mg | Freq: Once | INTRAMUSCULAR | Status: AC
  Administered 2021-02-23: 10 mg
  Filled 2021-02-23: qty 1

## 2021-02-23 MED ORDER — IOHEXOL 180 MG/ML  SOLN
10.0000 mL | Freq: Once | INTRAMUSCULAR | Status: AC
  Administered 2021-02-23: 10 mL via EPIDURAL
  Filled 2021-02-23: qty 20

## 2021-02-23 MED ORDER — SODIUM CHLORIDE 0.9% FLUSH
1.0000 mL | Freq: Once | INTRAVENOUS | Status: AC
  Administered 2021-02-23: 10 mL

## 2021-02-23 MED ORDER — ROPIVACAINE HCL 2 MG/ML IJ SOLN
1.0000 mL | Freq: Once | INTRAMUSCULAR | Status: AC
  Administered 2021-02-23: 10 mL via EPIDURAL
  Filled 2021-02-23: qty 10

## 2021-02-23 NOTE — Patient Instructions (Signed)

## 2021-02-23 NOTE — Progress Notes (Signed)
Safety precautions to be maintained throughout the outpatient stay will include: orient to surroundings, keep bed in low position, maintain call bell within reach at all times, provide assistance with transfer out of bed and ambulation.  

## 2021-02-23 NOTE — Progress Notes (Signed)
PROVIDER NOTE: Information contained herein reflects review and annotations entered in association with encounter. Interpretation of such information and data should be left to medically-trained personnel. Information provided to patient can be located elsewhere in the medical record under "Patient Instructions". Document created using STT-dictation technology, any transcriptional errors that may result from process are unintentional.    Patient: Yvonne [name]  Service Category: Procedure  Provider: Gillis Santa, MD  DOB: 09/28/1966  DOS: 02/23/2021  Location: [facility]  MRN: [id number]  Setting: Ambulatory - outpatient  Referring Provider: Gillis Santa, MD  Type: Established Patient  Specialty: Interventional Pain Management  PCP: Burnard Hawthorne, FNP   Primary Reason for Visit: Interventional Pain Management Treatment. CC: Neck Pain  Procedure:          Anesthesia, Analgesia, Anxiolysis:  Type: Diagnostic, Inter-Laminar, Cervical Epidural Steroid Injection  #1  Region: Posterior Cervico-thoracic Region Level: C7-T1 Laterality: Right-Sided Paramedial  Type: Moderate (Conscious) Sedation combined with Local Anesthesia Indication(s): Analgesia and Anxiety Route: Intravenous (IV) IV Access: Secured Sedation: Meaningful verbal contact was maintained at all times during the procedure  Local Anesthetic: Lidocaine 1-2%  Position: Prone with head of the table was raised to facilitate breathing.   Indications: 1. Cervical radicular pain   2. Chronic pain syndrome   3. Foraminal stenosis of cervical region    Pain Score: Pre-procedure: 4 /10 Post-procedure: 0-No pain/10   C-MRI 05/16/20 CLINICAL DATA:  Cervical radiculopathy. Paresthesias in both arms. Headaches. Incontinence.  EXAM: MRI CERVICAL SPINE WITHOUT CONTRAST  TECHNIQUE: Multiplanar, multisequence MR imaging of the cervical spine was performed. No intravenous contrast was administered.  COMPARISON:   Cervical spine radiographs 03/19/2020  FINDINGS: Alignment: Mild reversal of the normal cervical lordosis. Trace anterolisthesis of C3 on C4.  Vertebrae: No fracture, suspicious osseous lesion, or significant marrow edema. Moderate disc space narrowing and degenerative endplate changes at X4-5 and C6-7.  Cord: Normal signal.  Posterior Fossa, vertebral arteries, paraspinal tissues: Unremarkable.  Disc levels:  C2-3: Mild disc bulging, uncovertebral spurring, and mild facet arthrosis without significant stenosis.  C3-4: Right eccentric disc bulging, asymmetric right uncovertebral spurring, and severe right facet arthrosis result in severe right neural foraminal stenosis and mild spinal stenosis.  C4-5: Mild disc bulging, asymmetric left uncovertebral spurring, and severe right and mild-to-moderate left facet arthrosis result in mild spinal stenosis and moderate left neural foraminal stenosis.  C5-6: Broad-based posterior disc osteophyte complex eccentric to the left and mild facet arthrosis result in mild spinal stenosis and moderate right and severe left neural foraminal stenosis with potential bilateral C6 nerve root impingement.  C6-7: Broad-based posterior disc osteophyte complex eccentric to the left results in mild spinal stenosis and mild right and moderate to severe left neural foraminal stenosis with potential left C7 nerve root impingement.  C7-T1: Negative.  IMPRESSION: 1. 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 2. Severe right neural foraminal stenosis at C3-4. 3. Moderate right and severe left neural foraminal stenosis at C5-6. 4. Moderate to severe left neural foraminal stenosis at C6-7.  Pre-op H&P Assessment:  Yvonne [name] is a 55 y.o. (year old), female patient, seen today for interventional treatment. She  has a past surgical history that includes Tubal ligation; laparoscopy (N/A,  04/11/2017); Hysteroscopy with D & C (N/A, 04/11/2017); Cervical polypectomy (N/A, 04/11/2017); Laparoscopic salpingo oophorectomy (Bilateral, 05/05/2017); IUD removal (N/A, 05/05/2017); and Partial hysterectomy. Yvonne [name] has a current medication list which includes the following prescription(s): acetaminophen,  adalimumab, amlodipine, baclofen, clindamycin, duloxetine, fexofenadine, gabapentin, hydroxychloroquine, latanoprost, melatonin, meloxicam, montelukast, nortriptyline, omeprazole, ondansetron, phentermine, propranolol, timolol, and sumatriptan, and the following Facility-Administered Medications: fentanyl. Her primarily concern today is the Neck Pain  Initial Vital Signs:  Pulse/HCG Rate: 61ECG Heart Rate: (!) 56 Temp: (!) 97.5 F (36.4 C) Resp: 16 BP: (!) 145/72 SpO2: 98 %  BMI: Estimated body mass index is 42.07 kg/m as calculated from the following:   Height as of this encounter: 5\' 2"  (1.575 m).   Weight as of this encounter: 230 lb (104.3 kg).  Risk Assessment: Allergies: Reviewed. She is allergic to dried figs [ficus], flonase [fluticasone propionate], sulfa antibiotics, and tussionex pennkinetic er [employer] polst-cpm polst er].  Allergy Precautions: None required Coagulopathies: Reviewed. None identified.  Blood-thinner therapy: None at this time Active Infection(s): Reviewed. None identified. Yvonne [name] is afebrile  Site Confirmation: Yvonne [name] was asked to confirm the procedure and laterality before marking the site Procedure checklist: Completed Consent: Before the procedure and under the influence of no sedative(s), amnesic(s), or anxiolytics, the patient was informed of the treatment options, risks and possible complications. To fulfill our ethical and legal obligations, as recommended by the American Medical Association's Code of Ethics, I have informed the patient of my clinical impression; the nature and purpose of the treatment or procedure; the risks, benefits, and  possible complications of the intervention; the alternatives, including doing nothing; the risk(s) and benefit(s) of the alternative treatment(s) or procedure(s); and the risk(s) and benefit(s) of doing nothing. The patient was provided information about the general risks and possible complications associated with the procedure. These may include, but are not limited to: failure to achieve desired goals, infection, bleeding, organ or nerve damage, allergic reactions, paralysis, and death. In addition, the patient was informed of those risks and complications associated to Spine-related procedures, such as failure to decrease pain; infection (i.e.: Meningitis, epidural or intraspinal abscess); bleeding (i.e.: epidural hematoma, subarachnoid hemorrhage, or any other type of intraspinal or peri-dural bleeding); organ or nerve damage (i.e.: Any type of peripheral nerve, nerve root, or spinal cord injury) with subsequent damage to sensory, motor, and/or autonomic systems, resulting in permanent pain, numbness, and/or weakness of one or several areas of the body; allergic reactions; (i.e.: anaphylactic reaction); and/or death. Furthermore, the patient was informed of those risks and complications associated with the medications. These include, but are not limited to: allergic reactions (i.e.: anaphylactic or anaphylactoid reaction(s)); adrenal axis suppression; blood sugar elevation that in diabetics may result in ketoacidosis or comma; water retention that in patients with history of congestive heart failure may result in shortness of breath, pulmonary edema, and decompensation with resultant heart failure; weight gain; swelling or edema; medication-induced neural toxicity; particulate matter embolism and blood vessel occlusion with resultant organ, and/or nervous system infarction; and/or aseptic necrosis of one or more joints. Finally, the patient was informed that Medicine is not an exact science; therefore, there  is also the possibility of unforeseen or unpredictable risks and/or possible complications that may result in a catastrophic outcome. The patient indicated having understood very clearly. We have given the patient no guarantees and we have made no promises. Enough time was given to the patient to ask questions, all of which were answered to the patient's satisfaction. Yvonne [name] has indicated that she wanted to continue with the procedure. Attestation: I, the ordering provider, attest that I have discussed with the patient the benefits, risks, side-effects, alternatives, likelihood of achieving goals, and potential problems during recovery  for the procedure that I have provided informed consent. Date  Time: 02/23/2021 10:47 AM  Pre-Procedure Preparation:  Monitoring: As per clinic protocol. Respiration, ETCO2, SpO2, BP, heart rate and rhythm monitor placed and checked for adequate function Safety Precautions: Patient was assessed for positional comfort and pressure points before starting the procedure. Time-out: I initiated and conducted the "Time-out" before starting the procedure, as per protocol. The patient was asked to participate by confirming the accuracy of the "Time Out" information. Verification of the correct person, site, and procedure were performed and confirmed by me, the nursing staff, and the patient. "Time-out" conducted as per Joint Commission's Universal Protocol (UP.01.01.01). Time: 1139  Description of Procedure:          Target Area: For Epidural Steroid injections the target is the interlaminar space, initially targeting the lower border of the superior vertebral body lamina. Approach: Paramedial approach. Area Prepped: Entire PosteriorCervical Region DuraPrep (Iodine Povacrylex [0.7% available iodine] and Isopropyl Alcohol, 74% w/w) Safety Precautions: Aspiration looking for blood return was conducted prior to all injections. At no point did we inject any substances, as a  needle was being advanced. No attempts were made at seeking any paresthesias. Safe injection practices and needle disposal techniques used. Medications properly checked for expiration dates. SDV (single dose vial) medications used. Description of the Procedure: Protocol guidelines were followed. The procedure needle was introduced through the skin, ipsilateral to the reported pain, and advanced to the target area. Bone was contacted and the needle walked caudad, until the lamina was cleared. The epidural space was identified using "loss-of-resistance technique" with 2-3 ml of PF-NaCl (0.9% NSS), in a 5cc LOR glass syringe. Vitals:   02/23/21 1149 02/23/21 1159 02/23/21 1209 02/23/21 1216  BP: (!) 147/82 116/69 (!) 110/52 116/60  Pulse:  62 63 64  Resp: 20 16 20 18   Temp:      SpO2: 93% 99% 100% 100%  Weight:      Height:        Start Time: 1139 hrs. End Time: 1149 hrs. Materials:  Needle(s) Type: Epidural needle Gauge: 22G Length: 3.5-in Medication(s): Please see orders for medications and dosing details. 3 cc solution made of 1 cc of preservative-free saline, 1 cc of 0.2% ropivacaine, 1 cc of Decadron 10 mg/cc.  Imaging Guidance (Spinal):          Type of Imaging Technique: Fluoroscopy Guidance (Spinal) Indication(s): Assistance in needle guidance and placement for procedures requiring needle placement in or near specific anatomical locations not easily accessible without such assistance. Exposure Time: Please see nurses notes. Contrast: Before injecting any contrast, we confirmed that the patient did not have an allergy to iodine, shellfish, or radiological contrast. Once satisfactory needle placement was completed at the desired level, radiological contrast was injected. Contrast injected under live fluoroscopy. No contrast complications. See chart for type and volume of contrast used. Fluoroscopic Guidance: I was personally present during the use of fluoroscopy. "Tunnel Vision  Technique" used to obtain the best possible view of the target area. Parallax error corrected before commencing the procedure. "Direction-depth-direction" technique used to introduce the needle under continuous pulsed fluoroscopy. Once target was reached, antero-posterior, oblique, and lateral fluoroscopic projection used confirm needle placement in all planes. Images permanently stored in EMR. Interpretation: I personally interpreted the imaging intraoperatively. Adequate needle placement confirmed in multiple planes. Appropriate spread of contrast into desired area was observed. No evidence of afferent or efferent intravascular uptake. No intrathecal or subarachnoid spread observed. Permanent images saved  into the patient's record.   Post-operative Assessment:  Post-procedure Vital Signs:  Pulse/HCG Rate: 64 (nsr)62 Temp: (!) 97.5 F (36.4 C) Resp: 18 BP: 116/60 SpO2: 100 %  EBL: None  Complications: No immediate post-treatment complications observed by team, or reported by patient.  Note: The patient tolerated the entire procedure well. A repeat set of vitals were taken after the procedure and the patient was kept under observation following institutional policy, for this type of procedure. Post-procedural neurological assessment was performed, showing return to baseline, prior to discharge. The patient was provided with post-procedure discharge instructions, including a section on how to identify potential problems. Should any problems arise concerning this procedure, the patient was given instructions to immediately contact us, at any time, without hesitation. In any case, we plan to contact the patient by telephone for a follow-up status report regarding this interventional procedure.  Comments:  No additional relevant information.  5 out of 5 strength bilateral upper extremity: Shoulder abduction, elbow flexion, elbow extension, thumb extension.  Plan of Care  Orders:  Orders Placed  This Encounter  Procedures  . DG PAIN CLIN[facility] C-ARM 1-60 MIN NO REPORT    Intraoperative interpretation by procedural physician at [place].    Standing Status:   Standing    Number of Occurrences:   1    Order Specific Question:   Reason for exam:    Answer:   Assistance in needle guidance and placement for procedures requiring needle placement in or near specific anatomical locations not easily accessible without such assistance.    Medications ordered for procedure: Meds ordered this encounter  Medications  . iohexol (OMNIPAQUE) 180 MG/ML injection 10 mL    Must be Myelogram-compatible. If not available, you may substitute with a water-soluble, non-ionic, hypoallergenic, myelogram-compatible radiological contrast medium.  Marland Kitchen lidocaine (XYLOCAINE) 2 % (with pres) injection 400 mg  . fentaNYL (SUBLIMAZE) injection 25-50 mcg    Make sure Narcan is available in the pyxis when using this medication. In the event of respiratory depression (RR< 8/min): Titrate NARCAN (naloxone) in increments of 0.1 to 0.2 mg IV at 2-3 minute intervals, until desired degree of reversal.  . ropivacaine (PF) 2 mg/mL (0.2%) (NAROPIN) injection 1 mL  . sodium chloride flush (NS) 0.9 % injection 1 mL  . dexamethasone (DECADRON) injection 10 mg   Medications administered: We administered iohexol, lidocaine, ropivacaine (PF) 2 mg/mL (0.2%), sodium chloride flush, and dexamethasone.  See the medical record for exact dosing, route, and time of administration.  Follow-up plan:   Return in about 4 weeks (around 03/23/2021) for Post Procedure Evaluation, virtual.      R C7/T1 ESI 02/23/21   Recent Visits Date Type Provider Dept  02/05/21 Office Visit Gillis Santa, MD [facility]  Showing recent visits within past 90 days and meeting all other requirements Today's Visits Date Type Provider Dept  02/23/21 Procedure visit Gillis Santa, MD [facility]  Showing today's visits and  meeting all other requirements Future Appointments Date Type Provider Dept  03/23/21 Appointment Gillis Santa, MD [facility]  Showing future appointments within next 90 days and meeting all other requirements  Disposition: Discharge home  Discharge (Date  Time): 02/23/2021; 1217 hrs.   Primary Care Physician: Burnard Hawthorne, FNP Location: [facility] Note by: Gillis Santa, MD Date: 02/23/2021; Time: 1:33 PM  Disclaimer:  Medicine is not an exact science. The only guarantee in medicine is that nothing is guaranteed. It is  important to note that the decision to proceed with this intervention was based on the information collected from the patient. The Data and conclusions were drawn from the patient's questionnaire, the interview, and the physical examination. Because the information was provided in large part by the patient, it cannot be guaranteed that it has not been purposely or unconsciously manipulated. Every effort has been made to obtain as much relevant data as possible for this evaluation. It is important to note that the conclusions that lead to this procedure are derived in large part from the available data. Always take into account that the treatment will also be dependent on availability of resources and existing treatment guidelines, considered by other Pain Management Practitioners as being common knowledge and practice, at the time of the intervention. For Medico-Legal purposes, it is also important to point out that variation in procedural techniques and pharmacological choices are the acceptable norm. The indications, contraindications, technique, and results of the above procedure should only be interpreted and judged by a Board-Certified Interventional Pain Specialist with extensive familiarity and expertise in the same exact procedure and technique.

## 2021-02-24 ENCOUNTER — Telehealth: Payer: Self-pay

## 2021-02-24 ENCOUNTER — Other Ambulatory Visit: Payer: Self-pay | Admitting: Obstetrics and Gynecology

## 2021-02-24 NOTE — Telephone Encounter (Signed)
Called and talked with patient. Denies any needs at this time. States neck is not hurting. Instructed to call if needed.

## 2021-02-24 NOTE — Telephone Encounter (Signed)
Called and talked with patient

## 2021-03-04 ENCOUNTER — Encounter: Payer: Self-pay | Admitting: Family

## 2021-03-04 ENCOUNTER — Other Ambulatory Visit: Payer: Self-pay

## 2021-03-04 ENCOUNTER — Ambulatory Visit (INDEPENDENT_AMBULATORY_CARE_PROVIDER_SITE_OTHER): Payer: Managed Care, Other (non HMO) | Admitting: Family

## 2021-03-04 VITALS — BP 110/78 | HR 65 | Temp 98.3°F | Ht 62.0 in | Wt 231.8 lb

## 2021-03-04 DIAGNOSIS — Z1211 Encounter for screening for malignant neoplasm of colon: Secondary | ICD-10-CM | POA: Diagnosis not present

## 2021-03-04 DIAGNOSIS — G43809 Other migraine, not intractable, without status migrainosus: Secondary | ICD-10-CM

## 2021-03-04 DIAGNOSIS — I10 Essential (primary) hypertension: Secondary | ICD-10-CM | POA: Diagnosis not present

## 2021-03-04 DIAGNOSIS — G894 Chronic pain syndrome: Secondary | ICD-10-CM | POA: Diagnosis not present

## 2021-03-04 NOTE — Patient Instructions (Signed)
Referral for colonoscopy Let us know if you dont hear back within a week in regards to an appointment being scheduled.   

## 2021-03-04 NOTE — Assessment & Plan Note (Signed)
Controlled. Continue amlodipine 5mg .

## 2021-03-04 NOTE — Progress Notes (Signed)
Subjective:    Patient ID: ANGELYNN LEMUS, female    DOB: 08/04/1966, 55 y.o.   MRN: [id number]  CC: ZAYLI VILLAFUERTE is a 55 y.o. female who presents today for follow up.   HPI: Feels well today No new complaints  HTN-compliant with amlodipine 5mg . No cp.   Migraine- HAs are improved since nerve block with Dr Holley Raring.  HAs are at baseline. No new or worsening features. compliant with propranolol 40mg  bid, imitrex prn for relief. No vision changes.   Follows with Dr Posey Pronto for RA. She compliant with cymbalta 30mg , gabapentin 200mg  TID.  Compliant with plaquenil and humira [place].   Neck pain- improving since epidural injection with Dr Holley Raring.   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 Allergy   . Arthritis, rheumatoid ([place])   . GERD (gastroesophageal reflux disease)   . Gestational diabetes    patient denies  . Glaucoma   . H/O bronchitis   . Headache    migraine  . History of ovarian cyst   . Hypertension    pre-eclampsia with first child  . 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 Restless leg   . Shingles 2021   Past Surgical History:  Procedure Laterality Date  . CERVICAL POLYPECTOMY N/A 04/11/2017   Procedure: CERVICAL POLYPECTOMY;  Surgeon: Rubie Maid, MD;  Location: [facility];  Service: Gynecology;  Laterality: N/A;  . HYSTEROSCOPY WITH D & C N/A 04/11/2017   Procedure: DILATATION AND CURETTAGE /HYSTEROSCOPY;  Surgeon: Rubie Maid, MD;  Location: [facility];  Service: Gynecology;  Laterality: N/A;  . IUD REMOVAL N/A 05/05/2017   Procedure: INTRAUTERINE DEVICE (IUD) REMOVAL;  Surgeon: Rubie Maid, MD;  Location: [facility];  Service: Gynecology;  Laterality: N/A;  . LAPAROSCOPIC SALPINGO OOPHERECTOMY Bilateral 05/05/2017   Procedure: LAPAROSCOPIC BILATERAL SALPINGO OOPHORECTOMY;  Surgeon: Rubie Maid, MD;  Location: [facility];  Service: Gynecology;  Laterality: Bilateral;  . LAPAROSCOPY N/A 04/11/2017    Procedure: LAPAROSCOPY DIAGNOSTIC;  Surgeon: Rubie Maid, MD;  Location: [facility];  Service: Gynecology;  Laterality: N/A;  . PARTIAL HYSTERECTOMY     Ovaries and Tubes  . TUBAL LIGATION     Family History  Problem Relation Age of Onset  . Schizophrenia Mother   . Bipolar disorder Mother   . Alcohol abuse Father   . Hypertension Father   . Seizures Paternal Grandmother   . Diabetes Paternal Grandmother   . Hypertension Brother   . Breast cancer Brother   . Hypertension Maternal Grandmother     Allergies: Dried figs [ficus], Flonase [fluticasone propionate], Sulfa antibiotics, and Tussionex pennkinetic er [hydrocod polst-cpm polst er] Current Outpatient Medications on File Prior to Visit  Medication Sig Dispense Refill  . acetaminophen (TYLENOL) 325 MG tablet Take by mouth.    . Adalimumab (HUMIRA PEN [place]) Inject into the skin.    Marland Kitchen amLODipine (NORVASC) 5 MG tablet Take 1 tablet (5 mg total) by mouth daily. 90 tablet 1  . baclofen (LIORESAL) 10 MG tablet TAKE 1 TABLET BY MOUTH ONCE DAILY AS NEEDED FOR MUSCLE SPASM 30 tablet 3  . clindamycin (CLINDAGEL) 1 % gel Apply topically 2 (two) times daily. 30 g 4  . DULoxetine (CYMBALTA) 30 MG capsule Take 1 capsule (30 mg total) by mouth daily. Start AFTER you are weaned off paxil 90 capsule 3  . fexofenadine (ALLEGRA ALLERGY) 180 MG tablet Take 1 tablet (180 mg total) by  mouth daily. 90 tablet 1  . gabapentin (NEURONTIN) 100 MG capsule Take 2 capsules (200 mg total) by mouth 3 (three) times daily. 180 capsule 3  . hydroxychloroquine (PLAQUENIL) 200 MG tablet Take 200 mg by mouth 2 (two) times daily.    Marland Kitchen latanoprost (XALATAN) 0.005 % ophthalmic solution Place 1 drop into both eyes at bedtime.    . Melatonin 5 MG CAPS Take by mouth.    . meloxicam (MOBIC) 7.5 MG tablet Take 1 tablet (7.5 mg total) by mouth daily as needed for pain. With food 90 tablet 0  . montelukast (SINGULAIR) 10 MG tablet Take 1 tablet (10 mg total) by mouth at bedtime.  90 tablet 1  . nortriptyline (PAMELOR) 10 MG capsule Take 1 capsule (10 mg total) by mouth at bedtime. 90 capsule 1  . omeprazole (PRILOSEC) 20 MG capsule Take 1 capsule (20 mg total) by mouth daily. 90 capsule 0  . ondansetron (ZOFRAN ODT) 4 MG disintegrating tablet Take 1 tablet (4 mg total) by mouth every 8 (eight) hours as needed for nausea or vomiting. 30 tablet 1  . propranolol (INDERAL) 40 MG tablet Take 1 tablet (40 mg total) by mouth 2 (two) times daily. 60 tablet 2  . timolol (TIMOPTIC) 0.25 % ophthalmic solution Place 1 drop into both eyes daily.    . SUMAtriptan (IMITREX) 50 MG tablet Take 1 tablet (50 mg total) by mouth once for 1 dose. May repeat in 2 hours if headache persists or recurs. 10 tablet 2   No current facility-administered medications on file prior to visit.    Social History   Tobacco Use  . Smoking status: Former Smoker    Packs/day: 1.00    Years: 6.00    Pack years: 6.00    Types: Cigarettes    Quit date: 05/29/2000    Years since quitting: 20.7  . Smokeless tobacco: Never Used  . Tobacco comment: 1/2-1 PPD  Vaping Use  . Vaping Use: Never used  Substance Use Topics  . Alcohol use: No    Alcohol/week: 0.0 standard drinks  . Drug use: No    Review of Systems  Constitutional: Negative for chills and fever.  Respiratory: Negative for cough.   Cardiovascular: Negative for chest pain and palpitations.  Gastrointestinal: Negative for nausea and vomiting.  Musculoskeletal: Positive for neck pain.  Neurological: Negative for headaches.      Objective:    BP 110/78   Pulse 65   Temp 98.3 F (36.8 C)   Ht 5\' 2"  (1.575 m)   Wt 231 lb 12.8 oz (105.1 kg)   LMP 05/06/2017 (Exact Date)   SpO2 98%   BMI 42.40 kg/m  BP Readings from Last 3 Encounters:  03/04/21 110/78  02/23/21 116/60  02/11/21 136/81   Wt Readings from Last 3 Encounters:  03/04/21 231 lb 12.8 oz (105.1 kg)  02/23/21 230 lb (104.3 kg)  02/11/21 230 lb 3.2 oz (104.4 kg)     Physical Exam Vitals reviewed.  Constitutional:      Appearance: She is well-developed.  Eyes:     Conjunctiva/sclera: Conjunctivae normal.  Cardiovascular:     Rate and Rhythm: Normal rate and regular rhythm.     Pulses: Normal pulses.     Heart sounds: Normal heart sounds.  Pulmonary:     Effort: Pulmonary effort is normal.     Breath sounds: Normal breath sounds. No wheezing, rhonchi or rales.  Skin:    General: Skin is warm and dry.  Neurological:     Mental Status: She is alert.  Psychiatric:        Speech: Speech normal.        Behavior: Behavior normal.        Thought Content: Thought content normal.        Assessment & Plan:   Problem List Items Addressed This Visit      Cardiovascular and Mediastinum   Hypertension    Controlled. Continue amlodipine 5mg .      Migraines    Controlled. Improved since recent epidural injection. Continue propranolol 40mg  BID, imitrex prn.         Other   Chronic pain syndrome    Chronic neck pain, overall improved with epidural injection 02/23/21.  She is following with Dr Holley Raring. Continue cymbalta 30mg , gabapentin 200mg  TID      Screen for colon cancer - Primary   Relevant Orders   Ambulatory referral to Gastroenterology       I have discontinued Earnestene E. Macmillan's phentermine. I am also having her maintain her latanoprost, acetaminophen, hydroxychloroquine, Melatonin, clindamycin, DULoxetine, SUMAtriptan, amLODipine, Adalimumab (HUMIRA PEN [place]), baclofen, omeprazole, gabapentin, montelukast, ondansetron, timolol, propranolol, nortriptyline, meloxicam, and fexofenadine.   No orders of the defined types were placed in this encounter.   Return precautions given.   Risks, benefits, and alternatives of the medications and treatment plan prescribed today were discussed, and patient expressed understanding.   Education regarding symptom management and diagnosis given to patient on AVS.  Continue to follow with  Burnard Hawthorne, FNP for routine health maintenance.   [name] and I agreed with plan.   Mable Paris, FNP

## 2021-03-04 NOTE — Assessment & Plan Note (Signed)
Controlled. Improved since recent epidural injection. Continue propranolol 40mg  BID, imitrex prn.

## 2021-03-04 NOTE — Assessment & Plan Note (Addendum)
Chronic neck pain, overall improved with epidural injection 02/23/21.  She is following with Dr Holley Raring. Continue cymbalta 30mg , gabapentin 200mg  TID

## 2021-03-13 ENCOUNTER — Other Ambulatory Visit: Payer: Self-pay | Admitting: Family

## 2021-03-13 DIAGNOSIS — G8929 Other chronic pain: Secondary | ICD-10-CM

## 2021-03-15 ENCOUNTER — Other Ambulatory Visit: Payer: Self-pay | Admitting: Family

## 2021-03-15 DIAGNOSIS — I10 Essential (primary) hypertension: Secondary | ICD-10-CM

## 2021-03-15 DIAGNOSIS — R519 Headache, unspecified: Secondary | ICD-10-CM

## 2021-03-16 MED ORDER — MELOXICAM 7.5 MG PO TABS: 7.5000 mg | ORAL_TABLET | Freq: Every day | ORAL | 0 refills | Status: DC | PRN

## 2021-03-16 MED ORDER — OMEPRAZOLE 20 MG PO CPDR: 20.0000 mg | DELAYED_RELEASE_CAPSULE | Freq: Every day | ORAL | 0 refills | Status: DC

## 2021-03-17 ENCOUNTER — Telehealth: Payer: Self-pay

## 2021-03-17 ENCOUNTER — Other Ambulatory Visit: Payer: Self-pay

## 2021-03-17 DIAGNOSIS — Z1211 Encounter for screening for malignant neoplasm of colon: Secondary | ICD-10-CM

## 2021-03-17 MED ORDER — NA SULFATE-K SULFATE-MG SULF 17.5-3.13-1.6 GM/177ML PO SOLN: 1.0000 | Freq: Once | ORAL | 0 refills | Status: AC

## 2021-03-17 NOTE — Telephone Encounter (Signed)
Gastroenterology Pre-Procedure Review  Request Date: 03/24/21 Requesting Physician: Dr. Bonna Gains  PATIENT REVIEW QUESTIONS: The patient responded to the following health history questions as indicated:    1. Are you having any GI issues? no 2. Do you have a personal history of Polyps? no 3. Do you have a family history of Colon Cancer or Polyps? no 4. Diabetes Mellitus? no 5. Joint replacements in the past 12 months?no 6. Major health problems in the past 3 months?no 7. Any artificial heart valves, MVP, or defibrillator?no    MEDICATIONS & ALLERGIES:    Patient reports the following regarding taking any anticoagulation/antiplatelet therapy:   Plavix, Coumadin, Eliquis, Xarelto, Lovenox, Pradaxa, Brilinta, or Effient? no Aspirin? no  Patient confirms/reports the following medications:  Current Outpatient Medications  Medication Sig Dispense Refill  . acetaminophen (TYLENOL) 325 MG tablet Take by mouth.    . Adalimumab (HUMIRA PEN [place]) Inject into the skin.    Marland Kitchen amLODipine (NORVASC) 5 MG tablet Take 1 tablet (5 mg total) by mouth daily. 90 tablet 1  . baclofen (LIORESAL) 10 MG tablet TAKE 1 TABLET BY MOUTH ONCE DAILY AS NEEDED FOR MUSCLE SPASM 30 tablet 3  . clindamycin (CLINDAGEL) 1 % gel Apply topically 2 (two) times daily. 30 g 4  . DULoxetine (CYMBALTA) 30 MG capsule Take 1 capsule (30 mg total) by mouth daily. Start AFTER you are weaned off paxil 90 capsule 3  . fexofenadine (ALLEGRA ALLERGY) 180 MG tablet Take 1 tablet (180 mg total) by mouth daily. 90 tablet 1  . gabapentin (NEURONTIN) 100 MG capsule Take 2 capsules (200 mg total) by mouth 3 (three) times daily. 180 capsule 3  . hydroxychloroquine (PLAQUENIL) 200 MG tablet Take 200 mg by mouth 2 (two) times daily.    Marland Kitchen latanoprost (XALATAN) 0.005 % ophthalmic solution Place 1 drop into both eyes at bedtime.    . Melatonin 5 MG CAPS Take by mouth.    . meloxicam (MOBIC) 7.5 MG tablet Take 1 tablet (7.5 mg total) by mouth daily  as needed for pain. With food 90 tablet 0  . montelukast (SINGULAIR) 10 MG tablet Take 1 tablet (10 mg total) by mouth at bedtime. 90 tablet 1  . nortriptyline (PAMELOR) 10 MG capsule Take 1 capsule (10 mg total) by mouth at bedtime. 90 capsule 1  . omeprazole (PRILOSEC) 20 MG capsule Take 1 capsule (20 mg total) by mouth daily. 90 capsule 0  . ondansetron (ZOFRAN ODT) 4 MG disintegrating tablet Take 1 tablet (4 mg total) by mouth every 8 (eight) hours as needed for nausea or vomiting. 30 tablet 1  . propranolol (INDERAL) 40 MG tablet Take 1 tablet by mouth twice daily 60 tablet 0  . timolol (TIMOPTIC) 0.25 % ophthalmic solution Place 1 drop into both eyes daily.    . SUMAtriptan (IMITREX) 50 MG tablet Take 1 tablet (50 mg total) by mouth once for 1 dose. May repeat in 2 hours if headache persists or recurs. 10 tablet 2   No current facility-administered medications for this visit.    Patient confirms/reports the following allergies:  Allergies  Allergen Reactions  . Dried Figs [Ficus] Anaphylaxis    Tongue tingling & felt like throat was swelling.    Asencion Islam [Fluticasone Propionate]     Increases eye pressure   . Sulfa Antibiotics Swelling  . Tussionex Pennkinetic Er [Hydrocod Polst-Cpm Polst Er] Itching    Hyperactive     No orders of the defined types were placed in this  encounter.   AUTHORIZATION INFORMATION Primary Insurance: 1D#: Group #:  Secondary Insurance: 1D#: Group #:  SCHEDULE INFORMATION: Date: 04/26/ Time: Location:[facility]

## 2021-03-17 NOTE — Telephone Encounter (Signed)
Patient stated during her triage that she experiences nausea with anesthesia. I told her I would make Dr. Bonna Gains aware of this.  Thanks,  Sharyn Lull

## 2021-03-18 ENCOUNTER — Other Ambulatory Visit: Payer: Self-pay

## 2021-03-18 ENCOUNTER — Encounter: Payer: Self-pay | Admitting: Gastroenterology

## 2021-03-18 ENCOUNTER — Telehealth: Payer: Managed Care, Other (non HMO)

## 2021-03-19 ENCOUNTER — Other Ambulatory Visit: Payer: Self-pay

## 2021-03-19 ENCOUNTER — Encounter: Payer: Self-pay | Admitting: Student in an Organized Health Care Education/Training Program

## 2021-03-19 MED ORDER — NA SULFATE-K SULFATE-MG SULF 17.5-3.13-1.6 GM/177ML PO SOLN: 1.0000 | Freq: Once | ORAL | 0 refills | Status: AC

## 2021-03-23 ENCOUNTER — Other Ambulatory Visit: Payer: Self-pay

## 2021-03-23 ENCOUNTER — Ambulatory Visit
Payer: Managed Care, Other (non HMO) | Attending: Student in an Organized Health Care Education/Training Program | Admitting: Student in an Organized Health Care Education/Training Program

## 2021-03-23 DIAGNOSIS — M5412 Radiculopathy, cervical region: Secondary | ICD-10-CM

## 2021-03-23 DIAGNOSIS — G894 Chronic pain syndrome: Secondary | ICD-10-CM

## 2021-03-23 DIAGNOSIS — M47812 Spondylosis without myelopathy or radiculopathy, cervical region: Secondary | ICD-10-CM

## 2021-03-23 DIAGNOSIS — M4802 Spinal stenosis, cervical region: Secondary | ICD-10-CM

## 2021-03-23 NOTE — Progress Notes (Signed)
Patient: Yvonne [name]  Service Category: E/M  Provider: Gillis Santa, MD  DOB: June 04, 1966  DOS: 03/23/2021  Location: Office  MRN: [id number]  Setting: Ambulatory outpatient  Referring Provider: Burnard Hawthorne, FNP  Type: Established Patient  Specialty: Interventional Pain Management  PCP: Burnard Hawthorne, FNP  Location: Home  Delivery: TeleHealth     Virtual Encounter - Pain Management PROVIDER NOTE: Information contained herein reflects review and annotations entered in association with encounter. Interpretation of such information and data should be left to medically-trained personnel. Information provided to patient can be located elsewhere in the medical record under "Patient Instructions". Document created using STT-dictation technology, any transcriptional errors that may result from process are unintentional.    Contact & Pharmacy Preferred: [phone] Home: [phone] (home) Mobile: [phone] (mobile) E-mail: [email or web address]  [facility] [place] - [place] ([place]) [place] [address] Phone: [phone] Fax: [phone]   Pre-screening  Yvonne [name] offered "in-person" vs "virtual" encounter. She indicated preferring virtual for this encounter.   Reason COVID-19*  Social distancing based on CDC and AMA recommendations.   I contacted Yvonne [name] on 03/23/2021 via video conference.      I clearly identified myself as Gillis Santa, MD. I verified that I was speaking with the correct person using two identifiers (Name: Yvonne [name], and date of birth: 1965/12/24).  Consent I sought verbal advanced consent from Yvonne [name] for virtual visit interactions. I informed Yvonne [name] of possible security and privacy concerns, risks, and limitations associated with providing "not-in-person" medical evaluation and management services. I also informed Yvonne [name] of the availability of "in-person"  appointments. Finally, I informed her that there would be a charge for the virtual visit and that she could be  personally, fully or partially, financially responsible for it. Yvonne [name] expressed understanding and agreed to proceed.   Historic Elements   Yvonne [name] is a 55 y.o. year old, female patient evaluated today after our last contact on 02/23/2021. Yvonne [name]  has a past medical history of Allergy, Arthritis, rheumatoid ([place]), Degenerative disc disease, cervical, GERD (gastroesophageal reflux disease), Gestational diabetes, Glaucoma, H/O bronchitis, Headache, History of ovarian cyst, Hypertension, Increased pressure in the eye, bilateral, PONV (postoperative nausea and vomiting), Restless leg, and Shingles (2021). She also  has a past surgical history that includes Tubal ligation; laparoscopy (N/A, 04/11/2017); Hysteroscopy with D & C (N/A, 04/11/2017); Cervical polypectomy (N/A, 04/11/2017); Laparoscopic salpingo oophorectomy (Bilateral, 05/05/2017); IUD removal (N/A, 05/05/2017); and Partial hysterectomy. Yvonne [name] has a current medication list which includes the following prescription(s): acetaminophen, adalimumab, amlodipine, baclofen, clindamycin, duloxetine, fexofenadine, gabapentin, hydroxychloroquine, latanoprost, melatonin, meloxicam, montelukast, nortriptyline, omeprazole, ondansetron, propranolol, timolol, and sumatriptan. She  reports that she quit smoking about 20 years ago. Her smoking use included cigarettes. She has a 6.00 pack-year smoking history. She has never used smokeless tobacco. She reports that she does not drink alcohol and does not use drugs. Yvonne [name] is allergic to dried figs [ficus], flonase [fluticasone propionate], sulfa antibiotics, and tussionex pennkinetic er [hydrocod polst-cpm polst er].   HPI  Today, she is being contacted for a post-procedure assessment.   Post-Procedure Evaluation  Procedure (02/23/2021):   Type: Diagnostic, Inter-Laminar, Cervical  Epidural Steroid Injection  #1  Region: Posterior Cervico-thoracic Region Level: C7-T1 Laterality: Right-Sided Paramedial  Sedation: Please see nurses note.  Effectiveness during initial hour after procedure(Ultra-Short Term Relief): 100 %   Local anesthetic used: Long-acting (4-6 hours)  Effectiveness: Defined as any analgesic benefit obtained secondary to the administration of local anesthetics. This carries significant diagnostic value as to the etiological location, or anatomical origin, of the pain. Duration of benefit is expected to coincide with the duration of the local anesthetic used.  Effectiveness during initial 4-6 hours after procedure(Short-Term Relief): 100 %   Long-term benefit: Defined as any relief past the pharmacologic duration of the local anesthetics.  Effectiveness past the initial 6 hours after procedure(Long-Term Relief): 85 % (ongoing)  Current benefits: Defined as benefit that persist at this time.   Analgesia:  85% Function: Yvonne [name] reports improvement in function ROM: Yvonne [name] reports improvement in ROM  Patient endorses significant pain relief and functional improvement since her cervical epidural steroid injection.  She is sleeping better, performing ADLs better, is able to hold her phone and utensils without her arm going numb.  I am pleased with how she is doing.  We will continue to monitor her symptoms.  Repeat cervical epidural steroid injection as needed.  Laboratory Chemistry Profile   Renal Lab Results  Component Value Date   BUN 17 04/18/2020   CREATININE 0.69 04/18/2020   BCR 25 (H) 04/18/2020   GFRAA 117 04/18/2020   GFRNONAA 101 04/18/2020     Hepatic Lab Results  Component Value Date   AST 22 04/18/2020   ALT 20 04/18/2020   ALBUMIN 4.3 04/18/2020   ALKPHOS 109 04/18/2020   LIPASE 23 12/06/2016     Electrolytes Lab Results  Component Value Date   NA 139 04/18/2020   K 5.0 04/18/2020   CL 101 04/18/2020   CALCIUM 9.8  04/18/2020   PHOS 4.1 05/03/2018     Bone Lab Results  Component Value Date   VD25OH 35.8 12/29/2017     Inflammation (CRP: Acute Phase) (ESR: Chronic Phase) Lab Results  Component Value Date   CRP 3 09/04/2020   ESRSEDRATE 47 (H) 03/05/2019       Note: Above Lab results reviewed.   Assessment  The primary encounter diagnosis was Cervical radicular pain. Diagnoses of Foraminal stenosis of cervical region, Cervical facet joint syndrome, and Chronic pain syndrome were also pertinent to this visit.  Plan of Care   Yvonne [name] has a current medication list which includes the following long-term medication(s): adalimumab, amlodipine, duloxetine, fexofenadine, gabapentin, montelukast, nortriptyline, omeprazole, propranolol, and sumatriptan.   Orders:  Orders Placed This Encounter  Procedures  . Cervical Epidural Injection    Procedure: Cervical Epidural Steroid Injection/Block Purpose: Diagnostic Indication(s): Radiculitis and/or cervicalgia associater with cervical degenerative disc disease.    Standing Status:   Standing    Number of Occurrences:   6    Standing Expiration Date:   03/23/2022    Scheduling Instructions:     Level(s): C7-T1     Laterality: TBD     Sedation: Patient's choice.     Timeframe: PRN    Order Specific Question:   Where will this procedure be performed?    Answer:   [facility]    Comments:   Genevie Elman   Follow-up plan:   Return if symptoms worsen or fail to improve.     R C7/T1 ESI 02/23/21 helped 85%, repeat as needed   Recent Visits Date Type Provider Dept  02/23/21 Procedure visit Gillis Santa, MD [facility]  02/05/21 Office Visit Gillis Santa, MD [facility]  Showing recent visits within past 90 days and meeting all other requirements Today's Visits Date  Type Provider Dept  03/23/21 Telemedicine Gillis Santa, MD [facility]  Showing today's visits and meeting all other requirements Future  Appointments No visits were found meeting these conditions. Showing future appointments within next 90 days and meeting all other requirements  I discussed the assessment and treatment plan with the patient. The patient was provided an opportunity to ask questions and all were answered. The patient agreed with the plan and demonstrated an understanding of the instructions.  Patient advised to call back or seek an in-person evaluation if the symptoms or condition worsens.  Duration of encounter: 20 minutes.  Note by: Gillis Santa, MD Date: 03/23/2021; Time: 3:38 PM

## 2021-03-24 ENCOUNTER — Other Ambulatory Visit: Payer: Self-pay

## 2021-03-24 ENCOUNTER — Ambulatory Visit: Payer: Managed Care, Other (non HMO) | Admitting: Anesthesiology

## 2021-03-24 ENCOUNTER — Encounter
Admission: RE | Disposition: A | Discharge status: Home / Self Care | Payer: Self-pay | Source: Home / Self Care | Attending: Gastroenterology

## 2021-03-24 ENCOUNTER — Ambulatory Visit
Admission: RE | Admit: 2021-03-24 | Discharge: 2021-03-24 | Disposition: A | Discharge status: Home / Self Care | Payer: Managed Care, Other (non HMO) | Attending: Gastroenterology | Admitting: Gastroenterology

## 2021-03-24 ENCOUNTER — Encounter: Payer: Self-pay | Admitting: Gastroenterology

## 2021-03-24 DIAGNOSIS — Z79899 Other long term (current) drug therapy: Secondary | ICD-10-CM | POA: Insufficient documentation

## 2021-03-24 DIAGNOSIS — D123 Benign neoplasm of transverse colon: Secondary | ICD-10-CM | POA: Diagnosis not present

## 2021-03-24 DIAGNOSIS — Z8249 Family history of ischemic heart disease and other diseases of the circulatory system: Secondary | ICD-10-CM | POA: Diagnosis not present

## 2021-03-24 DIAGNOSIS — K635 Polyp of colon: Secondary | ICD-10-CM

## 2021-03-24 DIAGNOSIS — Z820 Family history of epilepsy and other diseases of the nervous system: Secondary | ICD-10-CM | POA: Insufficient documentation

## 2021-03-24 DIAGNOSIS — Z87891 Personal history of nicotine dependence: Secondary | ICD-10-CM | POA: Insufficient documentation

## 2021-03-24 DIAGNOSIS — D122 Benign neoplasm of ascending colon: Secondary | ICD-10-CM | POA: Diagnosis not present

## 2021-03-24 DIAGNOSIS — Z803 Family history of malignant neoplasm of breast: Secondary | ICD-10-CM | POA: Diagnosis not present

## 2021-03-24 DIAGNOSIS — K5730 Diverticulosis of large intestine without perforation or abscess without bleeding: Secondary | ICD-10-CM | POA: Diagnosis not present

## 2021-03-24 DIAGNOSIS — Z833 Family history of diabetes mellitus: Secondary | ICD-10-CM | POA: Insufficient documentation

## 2021-03-24 DIAGNOSIS — Z882 Allergy status to sulfonamides status: Secondary | ICD-10-CM | POA: Insufficient documentation

## 2021-03-24 DIAGNOSIS — Z1211 Encounter for screening for malignant neoplasm of colon: Secondary | ICD-10-CM

## 2021-03-24 DIAGNOSIS — D125 Benign neoplasm of sigmoid colon: Secondary | ICD-10-CM | POA: Diagnosis not present

## 2021-03-24 DIAGNOSIS — Z888 Allergy status to other drugs, medicaments and biological substances status: Secondary | ICD-10-CM | POA: Diagnosis not present

## 2021-03-24 DIAGNOSIS — D120 Benign neoplasm of cecum: Secondary | ICD-10-CM | POA: Insufficient documentation

## 2021-03-24 DIAGNOSIS — Z91018 Allergy to other foods: Secondary | ICD-10-CM | POA: Diagnosis not present

## 2021-03-24 DIAGNOSIS — K219 Gastro-esophageal reflux disease without esophagitis: Secondary | ICD-10-CM | POA: Insufficient documentation

## 2021-03-24 HISTORY — PX: COLONOSCOPY WITH PROPOFOL: SHX5780

## 2021-03-24 HISTORY — DX: Other specified postprocedural states: R11.2

## 2021-03-24 HISTORY — DX: Other cervical disc degeneration, unspecified cervical region: M50.30

## 2021-03-24 HISTORY — PX: POLYPECTOMY: SHX5525

## 2021-03-24 HISTORY — DX: Nausea with vomiting, unspecified: R11.2

## 2021-03-24 HISTORY — DX: Other specified postprocedural states: Z98.890

## 2021-03-24 SURGERY — COLONOSCOPY WITH PROPOFOL
Anesthesia: General | Site: Rectum

## 2021-03-24 MED ORDER — ACETAMINOPHEN 325 MG PO TABS: 325.0000 mg | ORAL_TABLET | ORAL | Status: DC | PRN

## 2021-03-24 MED ORDER — ONDANSETRON HCL 4 MG/2ML IJ SOLN
INTRAMUSCULAR | Status: DC | PRN
  Administered 2021-03-24: 4 mg via INTRAVENOUS

## 2021-03-24 MED ORDER — ONDANSETRON HCL 4 MG/2ML IJ SOLN: 4.0000 mg | Freq: Once | INTRAMUSCULAR | Status: DC | PRN

## 2021-03-24 MED ORDER — PROPOFOL 10 MG/ML IV BOLUS
INTRAVENOUS | Status: DC | PRN
  Administered 2021-03-24: 40 mg via INTRAVENOUS
  Administered 2021-03-24: 160 mg via INTRAVENOUS
  Administered 2021-03-24 (×2): 40 mg via INTRAVENOUS
  Administered 2021-03-24: 30 mg via INTRAVENOUS
  Administered 2021-03-24 (×4): 40 mg via INTRAVENOUS
  Administered 2021-03-24: 30 mg via INTRAVENOUS
  Administered 2021-03-24: 40 mg via INTRAVENOUS
  Administered 2021-03-24 (×3): 30 mg via INTRAVENOUS

## 2021-03-24 MED ORDER — LACTATED RINGERS IV SOLN: INTRAVENOUS | Status: DC

## 2021-03-24 MED ORDER — ACETAMINOPHEN 160 MG/5ML PO SOLN
325.0000 mg | ORAL | Status: DC | PRN
Start: 2021-03-24 — End: 2021-03-24

## 2021-03-24 MED ORDER — STERILE WATER FOR IRRIGATION IR SOLN: Status: DC | PRN

## 2021-03-24 MED ORDER — LIDOCAINE HCL (CARDIAC) PF 100 MG/5ML IV SOSY
PREFILLED_SYRINGE | INTRAVENOUS | Status: DC | PRN
  Administered 2021-03-24: 50 mg via INTRAVENOUS

## 2021-03-24 SURGICAL SUPPLY — 23 items
CLIP HMST 235XBRD CATH ROT (MISCELLANEOUS) IMPLANT
CLIP RESOLUTION 360 11X235 (MISCELLANEOUS)
ELECT REM PT RETURN 9FT ADLT (ELECTROSURGICAL)
ELECTRODE REM PT RTRN 9FT ADLT (ELECTROSURGICAL) IMPLANT
FCP ESCP3.2XJMB 240X2.8X (MISCELLANEOUS)
FORCEPS BIOP RAD 4 LRG CAP 4 (CUTTING FORCEPS) IMPLANT
FORCEPS BIOP RJ4 240 W/NDL (MISCELLANEOUS)
FORCEPS ESCP3.2XJMB 240X2.8X (MISCELLANEOUS) IMPLANT
GOWN CVR UNV OPN BCK APRN NK (MISCELLANEOUS) ×2 IMPLANT
GOWN ISOL THUMB LOOP REG UNIV (MISCELLANEOUS) ×4
INJECTOR VARIJECT VIN23 (MISCELLANEOUS) IMPLANT
KIT PRC NS LF DISP ENDO (KITS) ×1 IMPLANT
KIT PROCEDURE OLYMPUS (KITS) ×2
MANIFOLD NEPTUNE II (INSTRUMENTS) ×2 IMPLANT
MARKER SPOT ENDO TATTOO 5ML (MISCELLANEOUS) IMPLANT
SNARE COLD EXACTO (MISCELLANEOUS) ×2 IMPLANT
SNARE SHORT THROW 13M SML OVAL (MISCELLANEOUS) IMPLANT
SNARE SHORT THROW 30M LRG OVAL (MISCELLANEOUS) IMPLANT
SNARE SNG USE RND 15MM (INSTRUMENTS) IMPLANT
SPOT EX ENDOSCOPIC TATTOO (MISCELLANEOUS)
TRAP ETRAP POLY (MISCELLANEOUS) ×2 IMPLANT
VARIJECT INJECTOR VIN23 (MISCELLANEOUS)
WATER STERILE IRR 250ML POUR (IV SOLUTION) ×2 IMPLANT

## 2021-03-24 NOTE — Transfer of Care (Signed)
Immediate Anesthesia Transfer of Care Note  Patient: Yvonne [name]  Procedure(s) Performed: COLONOSCOPY WITH BIOPSY (N/A Rectum) POLYPECTOMY (N/A Rectum)  Patient Location: PACU  Anesthesia Type: General  Level of Consciousness: awake, alert  and patient cooperative  Airway and Oxygen Therapy: Patient Spontanous Breathing and Patient connected to supplemental oxygen  Post-op Assessment: Post-op Vital signs reviewed, Patient's Cardiovascular Status Stable, Respiratory Function Stable, Patent Airway and No signs of Nausea or vomiting  Post-op Vital Signs: Reviewed and stable  Complications: No complications documented.

## 2021-03-24 NOTE — Anesthesia Preprocedure Evaluation (Signed)
Anesthesia Evaluation  Patient identified by MRN, date of birth, ID band Patient awake    Reviewed: Allergy & Precautions, NPO status   History of Anesthesia Complications (+) PONV  Airway Mallampati: II  TM Distance: >3 FB     Dental   Pulmonary former smoker,    Pulmonary exam normal        Cardiovascular hypertension,  Rhythm:Regular Rate:Normal     Neuro/Psych  Headaches,    GI/Hepatic GERD  ,  Endo/Other  diabetesMorbid obesity  Renal/GU      Musculoskeletal  (+) Arthritis , Rheumatoid disorders,    Abdominal   Peds  Hematology   Anesthesia Other Findings   Reproductive/Obstetrics                             Anesthesia Physical Anesthesia Plan  ASA: III  Anesthesia Plan: General   Post-op Pain Management:    Induction: Intravenous  PONV Risk Score and Plan: Propofol infusion, TIVA and Treatment may vary due to age or medical condition  Airway Management Planned: Natural Airway and Nasal Cannula  Additional Equipment:   Intra-op Plan:   Post-operative Plan:   Informed Consent: I have reviewed the patients History and Physical, chart, labs and discussed the procedure including the risks, benefits and alternatives for the proposed anesthesia with the patient or authorized representative who has indicated his/her understanding and acceptance.       Plan Discussed with: CRNA  Anesthesia Plan Comments:         Anesthesia Quick Evaluation

## 2021-03-24 NOTE — Anesthesia Procedure Notes (Signed)
Date/Time: 03/24/2021 11:25 AM Performed by: Cameron Ali, CRNA Pre-anesthesia Checklist: Patient identified, Emergency Drugs available, Suction available, Timeout performed and Patient being monitored Patient Re-evaluated:Patient Re-evaluated prior to induction Oxygen Delivery Method: Nasal cannula Placement Confirmation: positive ETCO2

## 2021-03-24 NOTE — H&P (Signed)
[name], MD [address] [place], [place], [place], [address] [address], [place], [place], [place], [address] Phone: [phone]  Fax: [phone]  Primary Care Physician:  Burnard Hawthorne, FNP   Pre-Procedure History & Physical: HPI:  Yvonne [name] is a 55 y.o. female is here for a colonoscopy.   Past Medical History:  Diagnosis Date  . Allergy   . Arthritis, rheumatoid ([place])   . Degenerative disc disease, cervical   . GERD (gastroesophageal reflux disease)   . Gestational diabetes    patient denies  . Glaucoma   . H/O bronchitis   . Headache    migraine  . History of ovarian cyst   . Hypertension    pre-eclampsia with first child  . 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 PONV (postoperative nausea and vomiting)   . Restless leg   . Shingles 2021    Past Surgical History:  Procedure Laterality Date  . CERVICAL POLYPECTOMY N/A 04/11/2017   Procedure: CERVICAL POLYPECTOMY;  Surgeon: Rubie Maid, MD;  Location: [facility] ORS;  Service: Gynecology;  Laterality: N/A;  . HYSTEROSCOPY WITH D & C N/A 04/11/2017   Procedure: DILATATION AND CURETTAGE /HYSTEROSCOPY;  Surgeon: Rubie Maid, MD;  Location: [facility];  Service: Gynecology;  Laterality: N/A;  . IUD REMOVAL N/A 05/05/2017   Procedure: INTRAUTERINE DEVICE (IUD) REMOVAL;  Surgeon: Rubie Maid, MD;  Location: [facility];  Service: Gynecology;  Laterality: N/A;  . LAPAROSCOPIC SALPINGO OOPHERECTOMY Bilateral 05/05/2017   Procedure: LAPAROSCOPIC BILATERAL SALPINGO OOPHORECTOMY;  Surgeon: Rubie Maid, MD;  Location: [facility];  Service: Gynecology;  Laterality: Bilateral;  . LAPAROSCOPY N/A 04/11/2017   Procedure: LAPAROSCOPY DIAGNOSTIC;  Surgeon: Rubie Maid, MD;  Location: [facility];  Service: Gynecology;  Laterality: N/A;  . PARTIAL HYSTERECTOMY     Ovaries and Tubes  . TUBAL LIGATION      Prior to Admission medications   Medication Sig Start Date End Date Taking? Authorizing Provider   acetaminophen (TYLENOL) 650 MG CR tablet Take 650 mg by mouth every 8 (eight) hours as needed for pain.   Yes [provider]  Adalimumab (HUMIRA PEN [place]) Inject into the skin.   Yes [provider]  amLODipine (NORVASC) 5 MG tablet Take 1 tablet (5 mg total) by mouth daily. 09/10/20  Yes Burnard Hawthorne, FNP  baclofen (LIORESAL) 10 MG tablet TAKE 1 TABLET BY MOUTH ONCE DAILY AS NEEDED FOR MUSCLE SPASM 11/19/20  Yes Burnard Hawthorne, FNP  clindamycin (CLINDAGEL) 1 % gel Apply topically 2 (two) times daily. 04/01/20  Yes Rubie Maid, MD  DULoxetine (CYMBALTA) 30 MG capsule Take 1 capsule (30 mg total) by mouth daily. Start AFTER you are weaned off paxil 07/18/20  Yes Arnett, Yvetta Coder, FNP  fexofenadine [facility] ALLERGY) 180 MG tablet Take 1 tablet (180 mg total) by mouth daily. 02/06/21  Yes Arnett, Yvetta Coder, FNP  gabapentin (NEURONTIN) 100 MG capsule Take 2 capsules (200 mg total) by mouth 3 (three) times daily. 12/11/20  Yes Arnett, Yvetta Coder, FNP  hydroxychloroquine (PLAQUENIL) 200 MG tablet Take 200 mg by mouth 2 (two) times daily. 10/13/19  Yes [provider]  latanoprost (XALATAN) 0.005 % ophthalmic solution Place 1 drop into both eyes at bedtime.   Yes [provider]  Melatonin 5 MG CAPS Take by mouth.   Yes [provider]  meloxicam (MOBIC) 7.5 MG tablet Take 1 tablet (7.5 mg total) by mouth daily as needed for pain. With food  03/16/21  Yes Arnett, Yvetta Coder, FNP  montelukast (SINGULAIR) 10 MG tablet Take 1 tablet (10 mg total) by mouth at bedtime. 12/19/20  Yes Burnard Hawthorne, FNP  nortriptyline (PAMELOR) 10 MG capsule Take 1 capsule (10 mg total) by mouth at bedtime. 12/26/20  Yes Burnard Hawthorne, FNP  omeprazole (PRILOSEC) 20 MG capsule Take 1 capsule (20 mg total) by mouth daily. 03/16/21  Yes Burnard Hawthorne, FNP  ondansetron (ZOFRAN ODT) 4 MG disintegrating tablet Take 1 tablet (4 mg total) by mouth every 8 (eight) hours as  needed for nausea or vomiting. 12/19/20  Yes Burnard Hawthorne, FNP  propranolol (INDERAL) 40 MG tablet Take 1 tablet by mouth twice daily 03/16/21  Yes Arnett, Yvetta Coder, FNP  SUMAtriptan (IMITREX) 50 MG tablet Take 1 tablet (50 mg total) by mouth once for 1 dose. May repeat in 2 hours if headache persists or recurs. 08/18/20 11/04/20 Yes Arnett, Yvetta Coder, FNP  timolol (TIMOPTIC) 0.25 % ophthalmic solution Place 1 drop into both eyes daily. 12/15/20  Yes [provider]    Allergies as of 03/17/2021 - Review Complete 03/04/2021  Allergen Reaction Noted  . Dried figs [ficus] Anaphylaxis 08/05/2020  . Flonase [fluticasone propionate]  03/04/2017  . Sulfa antibiotics Swelling 10/08/2015  . Tussionex pennkinetic er [hydrocod polst-cpm polst er] Itching 10/08/2015    Family History  Problem Relation Age of Onset  . Schizophrenia Mother   . Bipolar disorder Mother   . Alcohol abuse Father   . Hypertension Father   . Seizures Paternal Grandmother   . Diabetes Paternal Grandmother   . Hypertension Brother   . Breast cancer Brother   . Hypertension Maternal Grandmother     Social History   Socioeconomic History  . Marital status: Married    Spouse name: Not on file  . Number of children: Not on file  . Years of education: Not on file  . Highest education level: Not on file  Occupational History  . Occupation: Chemical engineer at [employer]  Tobacco Use  . Smoking status: Former Smoker    Packs/day: 1.00    Years: 6.00    Pack years: 6.00    Types: Cigarettes    Quit date: 05/29/2000    Years since quitting: 20.8  . Smokeless tobacco: Never Used  . Tobacco comment: 1/2-1 PPD  Vaping Use  . Vaping Use: Never used  Substance and Sexual Activity  . Alcohol use: No    Alcohol/week: 0.0 standard drinks  . Drug use: No  . Sexual activity: Yes    Birth control/protection: Surgical  Other Topics Concern  . Not on file  Social History Narrative   Working as  Research scientist (physical sciences) at [employer] Strain: Not on [employer] Insecurity: Not on file  Transportation Needs: Not on file  Physical Activity: Not on file  Stress: Not on file  Social Connections: Not on file  Intimate Partner Violence: Not on file    Review of Systems: See HPI, otherwise negative ROS  Physical Exam: BP 127/73   Pulse 71   Temp 98.4 F (36.9 C) (Temporal)   Resp 18   Ht 5\' 2"  (1.575 m)   Wt 101.6 kg   LMP 05/06/2017 (Exact Date) Comment: BSO  SpO2 95%   BMI 40.97 kg/m  General:   Alert,  pleasant and cooperative in NAD Head:  Normocephalic and atraumatic. Neck:  Supple; no masses  or thyromegaly. Lungs:  Clear throughout to auscultation, normal respiratory effort.    Heart:  +S1, +S2, Regular rate and rhythm, No edema. Abdomen:  Soft, nontender and nondistended. Normal bowel sounds, without guarding, and without rebound.   Neurologic:  Alert and  oriented x4;  grossly normal neurologically.  Impression/Plan: MYRKA SYLVA is here for a colonoscopy to be performed for average risk screening.  Risks, benefits, limitations, and alternatives regarding  colonoscopy have been reviewed with the patient.  Questions have been answered.  All parties agreeable.   Virgel Manifold, MD  03/24/2021, 10:29 AM

## 2021-03-24 NOTE — Anesthesia Postprocedure Evaluation (Signed)
Anesthesia Post Note  Patient: Yvonne [name]  Procedure(s) Performed: COLONOSCOPY WITH BIOPSY (N/A Rectum) POLYPECTOMY (N/A Rectum)     Patient location during evaluation: PACU Anesthesia Type: General Level of consciousness: awake Pain management: pain level controlled Vital Signs Assessment: post-procedure vital signs reviewed and stable Respiratory status: respiratory function stable Cardiovascular status: stable Postop Assessment: no signs of nausea or vomiting Anesthetic complications: no   No complications documented.  Veda Canning

## 2021-03-24 NOTE — Discharge Instructions (Signed)

## 2021-03-24 NOTE — Op Note (Signed)
[facility] Gastroenterology Patient Name: Yvonne [name] Procedure Date: 03/24/2021 11:14 AM MRN: [id number] Account #: [id number] Date of Birth: 1966-03-27 Admit Type: Outpatient Age: 55 Room: [facility] OR ROOM 01 Gender: Female Note Status: Finalized Procedure:             Colonoscopy Indications:           Screening for colorectal malignant neoplasm Providers:             Shristi Scheib B. Maximino Greenland MD, MD Referring MD:          Lyn Records. Arnett (Referring MD) Medicines:             Monitored Anesthesia Care Complications:         No immediate complications. Procedure:             Pre-Anesthesia Assessment:                        - ASA Grade Assessment: II - A patient with mild                         systemic disease.                        - Prior to the procedure, a History and Physical was                         performed, and patient medications, allergies and                         sensitivities were reviewed. The patient's tolerance                         of previous anesthesia was reviewed.                        - The risks and benefits of the procedure and the                         sedation options and risks were discussed with the                         patient. All questions were answered and informed                         consent was obtained.                        - Patient identification and proposed procedure were                         verified prior to the procedure by the physician, the                         nurse, the anesthesiologist, the anesthetist and the                         technician. The procedure was verified in the                         procedure room.  After obtaining informed consent, the colonoscope was                         passed under direct vision. Throughout the procedure,                         the patient's blood pressure, pulse, and oxygen                         saturations were  monitored continuously. The was                         introduced through the anus and advanced to the the                         cecum, identified by appendiceal orifice and ileocecal                         valve. The colonoscopy was performed with ease. The                         patient tolerated the procedure well. The quality of                         the bowel preparation was fair. Findings:      The perianal and digital rectal examinations were normal.      Seven flat and sessile polyps were found in the sigmoid colon,       transverse colon, ascending colon and cecum. The polyps were 4 to 8 mm       in size. These polyps were removed with a cold snare. Resection and       retrieval were complete. The location of the polyps was as follows: 2       cecal, 2 Ascending, 2 Transverse and 1 sigmoid polyp.      Diverticula were found in the sigmoid colon and ascending colon.      The exam was otherwise without abnormality.      The rectum, sigmoid colon, descending colon, transverse colon, ascending       colon and cecum appeared normal.      The retroflexed view of the distal rectum and anal verge was normal and       showed no anal or rectal abnormalities. Impression:            - Preparation of the colon was fair.                        - Seven 4 to 8 mm polyps in the sigmoid colon, in the                         transverse colon, in the ascending colon and in the                         cecum, removed with a cold snare. Resected and                         retrieved.                        -  Diverticulosis in the sigmoid colon and in the                         ascending colon.                        - The examination was otherwise normal.                        - The rectum, sigmoid colon, descending colon,                         transverse colon, ascending colon and cecum are normal.                        - The distal rectum and anal verge are normal on                          retroflexion view. Recommendation:        - Discharge patient to home (with escort).                        - Advance diet as tolerated.                        - Continue present medications.                        - Await pathology results.                        - Repeat colonoscopy in 6 months, with 2 day prep.                        - The findings and recommendations were discussed with                         the patient.                        - The findings and recommendations were discussed with                         the patient's family.                        - Return to primary care physician as previously                         scheduled.                        - High fiber diet. Procedure Code(s):     --- Professional ---                        [phone], Colonoscopy, flexible; with removal of                         tumor(s), polyp(s), or other lesion(s) by snare  technique Diagnosis Code(s):     --- Professional ---                        Z12.11, Encounter for screening for malignant neoplasm                         of colon                        K63.5, Polyp of colon CPT copyright 2019 American Medical Association. All rights reserved. The codes documented in this report are preliminary and upon coder review may  be revised to meet current compliance requirements.  [name], MD Margretta Sidle B. Bonna Gains MD, MD 03/24/2021 11:59:23 AM This report has been signed electronically. Number of Addenda: 0 Note Initiated On: 03/24/2021 11:14 AM Scope Withdrawal Time: 0 hours 16 minutes 49 seconds  Total Procedure Duration: 0 hours 22 minutes 39 seconds  Estimated Blood Loss:  Estimated blood loss: none.      [facility]

## 2021-03-25 ENCOUNTER — Encounter: Payer: Self-pay | Admitting: Family

## 2021-03-25 ENCOUNTER — Other Ambulatory Visit: Payer: Self-pay | Admitting: Family

## 2021-03-25 DIAGNOSIS — G8929 Other chronic pain: Secondary | ICD-10-CM

## 2021-03-26 ENCOUNTER — Encounter: Payer: Self-pay | Admitting: Family

## 2021-03-26 LAB — SURGICAL PATHOLOGY

## 2021-03-26 MED ORDER — MELOXICAM 7.5 MG PO TABS: 7.5000 mg | ORAL_TABLET | Freq: Every day | ORAL | 0 refills | Status: DC | PRN

## 2021-03-31 ENCOUNTER — Encounter: Payer: Self-pay | Admitting: Gastroenterology

## 2021-04-01 ENCOUNTER — Other Ambulatory Visit: Payer: Self-pay

## 2021-04-01 ENCOUNTER — Encounter: Payer: Self-pay | Admitting: Family

## 2021-04-01 DIAGNOSIS — I10 Essential (primary) hypertension: Secondary | ICD-10-CM

## 2021-04-01 MED ORDER — AMLODIPINE BESYLATE 5 MG PO TABS: 5.0000 mg | ORAL_TABLET | Freq: Every day | ORAL | 1 refills | Status: DC

## 2021-04-06 ENCOUNTER — Other Ambulatory Visit: Payer: Self-pay | Admitting: Family

## 2021-04-06 DIAGNOSIS — G2581 Restless legs syndrome: Secondary | ICD-10-CM

## 2021-04-10 ENCOUNTER — Other Ambulatory Visit: Payer: Self-pay | Admitting: Family

## 2021-04-10 DIAGNOSIS — G2581 Restless legs syndrome: Secondary | ICD-10-CM

## 2021-04-10 MED ORDER — GABAPENTIN 100 MG PO CAPS: 200.0000 mg | ORAL_CAPSULE | Freq: Three times a day (TID) | ORAL | 0 refills | Status: DC

## 2021-04-20 ENCOUNTER — Other Ambulatory Visit: Payer: Self-pay | Admitting: Family

## 2021-04-20 DIAGNOSIS — M62838 Other muscle spasm: Secondary | ICD-10-CM

## 2021-04-20 DIAGNOSIS — R519 Headache, unspecified: Secondary | ICD-10-CM

## 2021-04-20 DIAGNOSIS — M25562 Pain in left knee: Secondary | ICD-10-CM

## 2021-04-20 DIAGNOSIS — G43809 Other migraine, not intractable, without status migrainosus: Secondary | ICD-10-CM

## 2021-04-20 DIAGNOSIS — G8929 Other chronic pain: Secondary | ICD-10-CM

## 2021-04-20 DIAGNOSIS — I10 Essential (primary) hypertension: Secondary | ICD-10-CM

## 2021-04-20 MED ORDER — BACLOFEN 10 MG PO TABS: 10.0000 mg | ORAL_TABLET | Freq: Two times a day (BID) | ORAL | 0 refills | Status: DC

## 2021-04-20 MED ORDER — MELOXICAM 7.5 MG PO TABS: 7.5000 mg | ORAL_TABLET | Freq: Every day | ORAL | 0 refills | Status: DC | PRN

## 2021-04-20 MED ORDER — PROPRANOLOL HCL 40 MG PO TABS: 40.0000 mg | ORAL_TABLET | Freq: Two times a day (BID) | ORAL | 0 refills | Status: DC

## 2021-04-20 MED ORDER — SUMATRIPTAN SUCCINATE 50 MG PO TABS: 50.0000 mg | ORAL_TABLET | Freq: Once | ORAL | 0 refills | Status: DC

## 2021-04-21 ENCOUNTER — Other Ambulatory Visit: Payer: Self-pay

## 2021-04-21 MED ORDER — CLINDAMYCIN PHOSPHATE 1 % EX GEL: Freq: Two times a day (BID) | CUTANEOUS | 4 refills | Status: DC

## 2021-05-05 ENCOUNTER — Other Ambulatory Visit: Payer: Self-pay | Admitting: Family

## 2021-05-05 DIAGNOSIS — G43809 Other migraine, not intractable, without status migrainosus: Secondary | ICD-10-CM

## 2021-05-05 MED ORDER — ONDANSETRON 4 MG PO TBDP: 4.0000 mg | ORAL_TABLET | Freq: Three times a day (TID) | ORAL | 1 refills | Status: DC | PRN

## 2021-05-05 MED ORDER — SUMATRIPTAN SUCCINATE 50 MG PO TABS: 50.0000 mg | ORAL_TABLET | Freq: Once | ORAL | 0 refills | Status: DC

## 2021-05-05 NOTE — Addendum Note (Signed)
Addended by: Elpidio Galea T on: 05/05/2021 10:14 AM   Modules accepted: Orders

## 2021-05-14 ENCOUNTER — Other Ambulatory Visit: Payer: Self-pay

## 2021-05-14 ENCOUNTER — Other Ambulatory Visit: Payer: Managed Care, Other (non HMO)

## 2021-05-14 DIAGNOSIS — M059 Rheumatoid arthritis with rheumatoid factor, unspecified: Secondary | ICD-10-CM | POA: Diagnosis not present

## 2021-05-14 DIAGNOSIS — Z79899 Other long term (current) drug therapy: Secondary | ICD-10-CM

## 2021-05-18 ENCOUNTER — Other Ambulatory Visit: Payer: Self-pay | Admitting: Rheumatology

## 2021-05-19 LAB — CBC WITH DIFFERENTIAL/PLATELET
Basophils Absolute: 0 10*3/uL (ref 0.0–0.2)
Basos: 1 %
EOS (ABSOLUTE): 0 10*3/uL (ref 0.0–0.4)
Eos: 0 %
Hematocrit: 40.6 % (ref 34.0–46.6)
Hemoglobin: 13.9 g/dL (ref 11.1–15.9)
Immature Grans (Abs): 0 10*3/uL (ref 0.0–0.1)
Immature Granulocytes: 1 %
Lymphocytes Absolute: 1.6 10*3/uL (ref 0.7–3.1)
Lymphs: 25 %
MCH: 29.8 pg (ref 26.6–33.0)
MCHC: 34.2 g/dL (ref 31.5–35.7)
MCV: 87 fL (ref 79–97)
Monocytes Absolute: 0.4 10*3/uL (ref 0.1–0.9)
Monocytes: 6 %
Neutrophils Absolute: 4.4 10*3/uL (ref 1.4–7.0)
Neutrophils: 67 %
Platelets: 228 10*3/uL (ref 150–450)
RBC: 4.67 x10E6/uL (ref 3.77–5.28)
RDW: 11.6 % — ABNORMAL LOW (ref 11.7–15.4)
WBC: 6.4 10*3/uL (ref 3.4–10.8)

## 2021-05-19 LAB — ALT: ALT: 21 IU/L (ref 0–32)

## 2021-05-19 LAB — CREATININE, SERUM
Creatinine, Ser: 0.67 mg/dL (ref 0.57–1.00)
eGFR: 105 mL/min/{1.73_m2} (ref >59)

## 2021-05-19 LAB — AST: AST: 14 IU/L (ref 0–40)

## 2021-05-19 LAB — SEDIMENTATION RATE: Sed Rate: 13 mm/hr (ref 0–40)

## 2021-05-19 NOTE — Patient Instructions (Signed)
Breast Self-Awareness Breast self-awareness is knowing how your breasts look and feel. Doing breast self-awareness is important. It allows you to catch a breast problem early while it is still small and can be treated. All women should do breast self-awareness, including women who have had breast implants. Tell your doctorif you notice a change in your breasts. What you need: A mirror. A well-lit room. How to do a breast self-exam A breast self-exam is one way to learn what is normal for your breasts and tocheck for changes. To do a breast self-exam: Look for changes  Take off all the clothes above your waist. Stand in front of a mirror in a room with good lighting. Put your hands on your hips. Push your hands down. Look at your breasts and nipples in the mirror to see if one breast or nipple looks different from the other. Check to see if: The shape of one breast is different. The size of one breast is different. There are wrinkles, dips, and bumps in one breast and not the other. Look at each breast for changes in the skin, such as: Redness. Scaly areas. Look for changes in your nipples, such as: Liquid around the nipples. Bleeding. Dimpling. Redness. A change in where the nipples are.  Feel for changes  Lie on your back on the floor. Feel each breast. To do this, follow these steps: Pick a breast to feel. Put the arm closest to that breast above your head. Use your other arm to feel the nipple area of your breast. Feel the area with the pads of your three middle fingers by making small circles with your fingers. For the first circle, press lightly. For the second circle, press harder. For the third circle, press even harder. Keep making circles with your fingers at the different pressures as you move down your breast. Stop when you feel your ribs. Move your fingers a little toward the center of your body. Start making circles with your fingers again, this time going up until  you reach your collarbone. Keep making up-and-down circles until you reach your armpit. Remember to keep using the three pressures. Feel the other breast in the same way. Sit or stand in the tub or shower. With soapy water on your skin, feel each breast the same way you did in step 2 when you were lying on the floor.  Write down what you find Writing down what you find can help you remember what to tell your doctor. Write down: What is normal for each breast. Any changes you find in each breast, including: The kind of changes you find. Whether you have pain. Size and location of any lumps. When you last had your menstrual period. General tips Check your breasts every month. If you are breastfeeding, the best time to check your breasts is after you feed your baby or after you use a breast pump. If you get menstrual periods, the best time to check your breasts is 5-7 days after your menstrual period is over. With time, you will become comfortable with the self-exam, and you will begin to know if there are changes in your breasts. Contact a doctor if you: See a change in the shape or size of your breasts or nipples. See a change in the skin of your breast or nipples, such as red or scaly skin. Have fluid coming from your nipples that is not normal. Find a lump or thick area that was not there before. Have pain in   your breasts. Have any concerns about your breast health. Summary Breast self-awareness includes looking for changes in your breasts, as well as feeling for changes within your breasts. Breast self-awareness should be done in front of a mirror in a well-lit room. You should check your breasts every month. If you get menstrual periods, the best time to check your breasts is 5-7 days after your menstrual period is over. Let your doctor know of any changes you see in your breasts, including changes in size, changes on the skin, pain or tenderness, or fluid from your nipples that is  not normal. This information is not intended to replace advice given to you by your health care provider. Make sure you discuss any questions you have with your healthcare provider. Document Revised: 07/04/2018 Document Reviewed: 07/04/2018 Elsevier Patient Education  2022 Elsevier Inc.     Preventive Care 40-64 Years Old, Female Preventive care refers to lifestyle choices and visits with your health care provider that can promote health and wellness. This includes: A yearly physical exam. This is also called an annual wellness visit. Regular dental and eye exams. Immunizations. Screening for certain conditions. Healthy lifestyle choices, such as: Eating a healthy diet. Getting regular exercise. Not using drugs or products that contain nicotine and tobacco. Limiting alcohol use. What can I expect for my preventive care visit? Physical exam Your health care provider will check your: Height and weight. These may be used to calculate your BMI (body mass index). BMI is a measurement that tells if you are at a healthy weight. Heart rate and blood pressure. Body temperature. Skin for abnormal spots. Counseling Your health care provider may ask you questions about your: Past medical problems. Family's medical history. Alcohol, tobacco, and drug use. Emotional well-being. Home life and relationship well-being. Sexual activity. Diet, exercise, and sleep habits. Work and work environment. Access to firearms. Method of birth control. Menstrual cycle. Pregnancy history. What immunizations do I need?  Vaccines are usually given at various ages, according to a schedule. Your health care provider will recommend vaccines for you based on your age, medicalhistory, and lifestyle or other factors, such as travel or where you work. What tests do I need? Blood tests Lipid and cholesterol levels. These may be checked every 5 years, or more often if you are over 50 years old. Hepatitis C  test. Hepatitis B test. Screening Lung cancer screening. You may have this screening every year starting at age 55 if you have a 30-pack-year history of smoking and currently smoke or have quit within the past 15 years. Colorectal cancer screening. All adults should have this screening starting at age 50 and continuing until age 75. Your health care provider may recommend screening at age 45 if you are at increased risk. You will have tests every 1-10 years, depending on your results and the type of screening test. Diabetes screening. This is done by checking your blood sugar (glucose) after you have not eaten for a while (fasting). You may have this done every 1-3 years. Mammogram. This may be done every 1-2 years. Talk with your health care provider about when you should start having regular mammograms. This may depend on whether you have a family history of breast cancer. BRCA-related cancer screening. This may be done if you have a family history of breast, ovarian, tubal, or peritoneal cancers. Pelvic exam and Pap test. This may be done every 3 years starting at age 21. Starting at age 30, this may be done every   5 years if you have a Pap test in combination with an HPV test. Other tests STD (sexually transmitted disease) testing, if you are at risk. Bone density scan. This is done to screen for osteoporosis. You may have this scan if you are at high risk for osteoporosis. Talk with your health care provider about your test results, treatment options,and if necessary, the need for more tests. Follow these instructions at home: Eating and drinking  Eat a diet that includes fresh fruits and vegetables, whole grains, lean protein, and low-fat dairy products. Take vitamin and mineral supplements as recommended by your health care provider. Do not drink alcohol if: Your health care provider tells you not to drink. You are pregnant, may be pregnant, or are planning to become pregnant. If  you drink alcohol: Limit how much you have to 0-1 drink a day. Be aware of how much alcohol is in your drink. In the [place] one drink equals one 12 oz bottle of beer (355 mL), one 5 oz glass of wine (148 mL), or one 1 oz glass of hard liquor (44 mL).  Lifestyle Take daily care of your teeth and gums. Brush your teeth every morning and night with fluoride toothpaste. Floss one time each day. Stay active. Exercise for at least 30 minutes 5 or more days each week. Do not use any products that contain nicotine or tobacco, such as cigarettes, e-cigarettes, and chewing tobacco. If you need help quitting, ask your health care provider. Do not use drugs. If you are sexually active, practice safe sex. Use a condom or other form of protection to prevent STIs (sexually transmitted infections). If you do not wish to become pregnant, use a form of birth control. If you plan to become pregnant, see your health care provider for a prepregnancy visit. If told by your health care provider, take low-dose aspirin daily starting at age 50. Find healthy ways to cope with stress, such as: Meditation, yoga, or listening to music. Journaling. Talking to a trusted person. Spending time with friends and family. Safety Always wear your seat belt while driving or riding in a vehicle. Do not drive: If you have been drinking alcohol. Do not ride with someone who has been drinking. When you are tired or distracted. While texting. Wear a helmet and other protective equipment during sports activities. If you have firearms in your house, make sure you follow all gun safety procedures. What's next? Visit your health care provider once a year for an annual wellness visit. Ask your health care provider how often you should have your eyes and teeth checked. Stay up to date on all vaccines. This information is not intended to replace advice given to you by your health care provider. Make sure you discuss any questions you  have with your healthcare provider. Document Revised: 08/19/2020 Document Reviewed: 07/27/2018 Elsevier Patient Education  2022 Elsevier Inc.  

## 2021-05-20 ENCOUNTER — Other Ambulatory Visit: Payer: Self-pay | Admitting: Family

## 2021-05-20 ENCOUNTER — Encounter: Payer: Self-pay | Admitting: Obstetrics and Gynecology

## 2021-05-20 ENCOUNTER — Other Ambulatory Visit: Payer: Self-pay

## 2021-05-20 ENCOUNTER — Ambulatory Visit (INDEPENDENT_AMBULATORY_CARE_PROVIDER_SITE_OTHER): Payer: Managed Care, Other (non HMO) | Admitting: Obstetrics and Gynecology

## 2021-05-20 VITALS — BP 120/84 | HR 79 | Ht 62.0 in | Wt 239.9 lb

## 2021-05-20 DIAGNOSIS — M25561 Pain in right knee: Secondary | ICD-10-CM

## 2021-05-20 DIAGNOSIS — Z01419 Encounter for gynecological examination (general) (routine) without abnormal findings: Secondary | ICD-10-CM | POA: Diagnosis not present

## 2021-05-20 DIAGNOSIS — Z6841 Body Mass Index (BMI) 40.0 and over, adult: Secondary | ICD-10-CM

## 2021-05-20 DIAGNOSIS — G8929 Other chronic pain: Secondary | ICD-10-CM

## 2021-05-20 DIAGNOSIS — E785 Hyperlipidemia, unspecified: Secondary | ICD-10-CM | POA: Diagnosis not present

## 2021-05-20 DIAGNOSIS — M25562 Pain in left knee: Secondary | ICD-10-CM

## 2021-05-20 DIAGNOSIS — I10 Essential (primary) hypertension: Secondary | ICD-10-CM

## 2021-05-20 DIAGNOSIS — R519 Headache, unspecified: Secondary | ICD-10-CM

## 2021-05-20 MED ORDER — PROPRANOLOL HCL 40 MG PO TABS: 40.0000 mg | ORAL_TABLET | Freq: Two times a day (BID) | ORAL | 0 refills | Status: DC

## 2021-05-20 NOTE — Telephone Encounter (Signed)
Please see MyChart message from patient

## 2021-05-20 NOTE — Progress Notes (Signed)
ANNUAL PREVENTATIVE CARE GYNECOLOGY  ENCOUNTER NOTE  Subjective:       Yvonne [name] is a 55 y.o. G2P2 female here for a routine annual gynecologic exam. The patient is sexually active. The patient is not taking hormone replacement therapy.  She denies post-menopausal vaginal bleeding. The patient wears seatbelts: yes. The patient participates in regular exercise: no.    Notes her [name] is no longer working so has discontinued.  Is now on Prednisone shots.  Is supposed to begin Rinvoq next week. Is now experiencing chronic dry eyes. Is gaining weight again and more swollen.   Had colonoscopy, noted findings of polyps (1 precancerous) and diverticulitis. Notes that after changing her diet, her chronic side pain has essentially resolved (thinks it was due to the diverticulitis).    Gynecologic History Patient's last menstrual period was 05/06/2017 (exact date). Surgical menopause (bilateral oophorectomy in 2018) Contraception: post menopausal status.  Last Pap: 04/01/2020. Results were: normal Last mammogram: 12/23/2020.  Results were: normal.  Last Colonoscopy: 03/24/2021.  Results: 7 polyps, 1 precancerous.  Also with diverticulitis. Due for f/u in 6 months.    Obstetric History OB History  Gravida Para Term Preterm AB Living  2 2       2   SAB IAB Ectopic Multiple Live Births          2    # Outcome Date GA Lbr Len/2nd Weight Sex Delivery Anes PTL Lv  2 Para 1995    F Vag-Spont   LIV  1 Para 1992    M Vag-Spont   LIV    Past Medical History:  Diagnosis Date   Allergy    Arthritis, rheumatoid (HCC)    Degenerative disc disease, cervical    GERD (gastroesophageal reflux disease)    Gestational diabetes    patient denies   Glaucoma    H/O bronchitis    Headache    migraine   History of ovarian cyst    Hypertension    pre-eclampsia with first child   Increased pressure in the eye, bilateral    PONV (postoperative nausea and vomiting)    Restless leg    Shingles 2021     Family History  Problem Relation Age of Onset   Schizophrenia Mother    Bipolar disorder Mother    Alcohol abuse Father    Hypertension Father    Seizures Paternal Grandmother    Diabetes Paternal Grandmother    Hypertension Brother    Breast cancer Brother    Hypertension Maternal Grandmother     Past Surgical History:  Procedure Laterality Date   CERVICAL POLYPECTOMY N/A 04/11/2017   Procedure: CERVICAL POLYPECTOMY;  Surgeon: Rubie Maid, MD;  Location: [facility];  Service: Gynecology;  Laterality: N/A;   COLONOSCOPY WITH PROPOFOL N/A 03/24/2021   Procedure: COLONOSCOPY WITH BIOPSY;  Surgeon: Virgel Manifold, MD;  Location: [place];  Service: Endoscopy;  Laterality: N/A;   HYSTEROSCOPY WITH D & C N/A 04/11/2017   Procedure: DILATATION AND CURETTAGE /HYSTEROSCOPY;  Surgeon: Rubie Maid, MD;  Location: [facility];  Service: Gynecology;  Laterality: N/A;   IUD REMOVAL N/A 05/05/2017   Procedure: INTRAUTERINE DEVICE (IUD) REMOVAL;  Surgeon: Rubie Maid, MD;  Location: [facility];  Service: Gynecology;  Laterality: N/A;   LAPAROSCOPIC SALPINGO OOPHERECTOMY Bilateral 05/05/2017   Procedure: LAPAROSCOPIC BILATERAL SALPINGO OOPHORECTOMY;  Surgeon: Rubie Maid, MD;  Location: [facility];  Service: Gynecology;  Laterality: Bilateral;   LAPAROSCOPY N/A 04/11/2017   Procedure: LAPAROSCOPY DIAGNOSTIC;  Surgeon: Rubie Maid, MD;  Location: [facility];  Service: Gynecology;  Laterality: N/A;   PARTIAL HYSTERECTOMY     Ovaries and Tubes   POLYPECTOMY N/A 03/24/2021   Procedure: POLYPECTOMY;  Surgeon: Virgel Manifold, MD;  Location: [place];  Service: Endoscopy;  Laterality: N/A;   TUBAL LIGATION      Social History   Socioeconomic History   Marital status: Married    Spouse name: Not on file   Number of children: Not on file   Years of education: Not on file   Highest education level: Not on file  Occupational History   Occupation: Chemical engineer at [employer]  Tobacco Use   Smoking status: Former    Packs/day: 1.00    Years: 6.00    Pack years: 6.00    Types: Cigarettes    Quit date: 05/29/2000    Years since quitting: 20.9   Smokeless tobacco: Never   Tobacco comments:    1/2-1 PPD  Vaping Use   Vaping Use: Never used  Substance and Sexual Activity   Alcohol use: No    Alcohol/week: 0.0 standard drinks   Drug use: No   Sexual activity: Yes    Birth control/protection: Surgical  Other Topics Concern   Not on file  Social History Narrative   Working as Research scientist (physical sciences) at [employer] Determinants of Health   Financial Resource Strain: Not on file  Food Insecurity: Not on file  Transportation Needs: Not on file  Physical Activity: Not on file  Stress: Not on file  Social Connections: Not on file  Intimate Partner Violence: Not on file    Current Outpatient Medications on File Prior to Visit  Medication Sig Dispense Refill   acetaminophen (TYLENOL) 650 MG CR tablet Take 650 mg by mouth every 8 (eight) hours as needed for pain.     amLODipine (NORVASC) 5 MG tablet Take 1 tablet (5 mg total) by mouth daily. 90 tablet 1   baclofen (LIORESAL) 10 MG tablet Take 1 tablet (10 mg total) by mouth 2 (two) times daily. 30 tablet 0   clindamycin (CLINDAGEL) 1 % gel Apply topically 2 (two) times daily. 30 g 4   DULoxetine (CYMBALTA) 30 MG capsule Take 1 capsule (30 mg total) by mouth daily. Start AFTER you are weaned off paxil 90 capsule 3   fexofenadine (ALLEGRA ALLERGY) 180 MG tablet Take 1 tablet (180 mg total) by mouth daily. 90 tablet 1   gabapentin (NEURONTIN) 100 MG capsule Take 2 capsules (200 mg total) by mouth 3 (three) times daily. 180 capsule 0   hydroxychloroquine (PLAQUENIL) 200 MG tablet Take 200 mg by mouth 2 (two) times daily.     latanoprost (XALATAN) 0.005 % ophthalmic solution Place 1 drop into both eyes at bedtime.     Melatonin 5 MG CAPS Take by mouth.     meloxicam (MOBIC) 7.5 MG tablet Take 1  tablet (7.5 mg total) by mouth daily as needed for pain. With food 90 tablet 0   montelukast (SINGULAIR) 10 MG tablet Take 1 tablet (10 mg total) by mouth at bedtime. 90 tablet 1   nortriptyline (PAMELOR) 10 MG capsule Take 1 capsule (10 mg total) by mouth at bedtime. 90 capsule 1   omeprazole (PRILOSEC) 20 MG capsule Take 1 capsule (20 mg total) by mouth daily. 90 capsule 0   ondansetron (ZOFRAN ODT) 4 MG disintegrating tablet Take 1 tablet (4 mg total) by mouth every 8 (  eight) hours as needed for nausea or vomiting. 30 tablet 1   PREDNISONE PO Take by mouth.     propranolol (INDERAL) 40 MG tablet Take 1 tablet (40 mg total) by mouth 2 (two) times daily. 60 tablet 0   SUMAtriptan (IMITREX) 50 MG tablet Take 1 tablet (50 mg total) by mouth once for 1 dose. May repeat in 2 hours if headache persists or recurs. 10 tablet 0   timolol (TIMOPTIC) 0.25 % ophthalmic solution Place 1 drop into both eyes daily.     Adalimumab (HUMIRA PEN [place]) Inject into the skin. (Patient not taking: Reported on 05/20/2021)     No current facility-administered medications on file prior to visit.    Allergies  Allergen Reactions   Dried Figs [Ficus] Anaphylaxis    Tongue tingling & felt like throat was swelling.     Flonase [Fluticasone Propionate]     Increases eye pressure    Sulfa Antibiotics Swelling   Tussionex Pennkinetic Er [Hydrocod Polst-Cpm Polst Er] Itching    Hyperactive     Review of Systems ROS Review of Systems - General ROS: positive for weight gain. negative for - chills, fatigue, fever, hot flashes, night sweats weight loss Psychological ROS: negative for - anxiety, decreased libido, depression, mood swings, physical abuse or sexual abuse Ophthalmic ROS: negative for - blurry vision, eye pain or loss of vision ENT ROS: negative for - headaches, hearing change, visual changes or vocal changes Allergy and Immunology ROS: negative for - hives, itchy/watery eyes or seasonal allergies Hematological  and Lymphatic ROS: negative for - bleeding problems, bruising, swollen lymph nodes or weight loss Endocrine ROS: negative for - galactorrhea, hair pattern changes, hot flashes, malaise/lethargy, mood swings, palpitations, polydipsia/polyuria, skin changes, temperature intolerance or unexpected weight changes Breast ROS: negative for - new or changing breast lumps or nipple discharge Respiratory ROS: negative for - cough or shortness of breath Cardiovascular ROS: negative for - chest pain, irregular heartbeat, palpitations or shortness of breath Gastrointestinal ROS: no abdominal pain, change in bowel habits, or black or bloody stools Genito-Urinary ROS: Positive for urinary frequency, urgency, mild dysuria. No trouble voiding, or hematuria Musculoskeletal ROS: negative for - joint pain or joint stiffness Neurological ROS: negative for - bowel and bladder control changes Dermatological ROS: negative for rash and skin lesion changes. Positive for boils/acne   Objective:   BP 120/84   Pulse 79   Ht 5' 2"  (1.575 m)   Wt 239 lb 14.4 oz (108.8 kg)   LMP 05/06/2017 (Exact Date) Comment: BSO  BMI 43.88 kg/m  CONSTITUTIONAL: Well-developed, well-nourished female in no acute distress. Morbidly obese PSYCHIATRIC: Normal mood and affect. Normal behavior. Normal judgment and thought content. [place]: Alert and oriented to person, place, and time. Normal muscle tone coordination. No cranial nerve deficit noted. HENT:  Normocephalic, atraumatic, External right and left ear normal. Oropharynx is clear and moist EYES: Conjunctivae and EOM are normal. Pupils are equal, round, and reactive to light. No scleral icterus.  NECK: Normal range of motion, supple, no masses.  Normal thyroid.  SKIN: Skin is warm and dry. No rash noted. Not diaphoretic. No erythema. No pallor.  CARDIOVASCULAR: Normal heart rate noted, regular rhythm, no murmur. RESPIRATORY: Clear to auscultation bilaterally. Effort and breath  sounds normal, no problems with respiration noted. BREASTS: Symmetric in size. No masses, skin changes, nipple drainage, or lymphadenopathy. ABDOMEN: Soft, normal bowel sounds, no distention noted.  No tenderness, rebound or guarding.  BLADDER: Normal PELVIC:  Bladder: no  bladder distension noted  Urethra: normal appearing urethra with no masses, tenderness or lesions  Vulva: normal appearing vulva with no masses, tenderness or lesions  Vagina: normal appearing vagina with normal color and discharge, no lesions  Cervix: normal appearing cervix without discharge or lesions  Uterus: uterus is normal size, shape, consistency and nontender  Adnexa: surgically removed  RV: External Exam NormaI, internal not performed.  MUSCULOSKELETAL: Normal range of motion. No tenderness.  No cyanosis, clubbing, or edema.  2+ distal pulses. LYMPHATIC: No Axillary, Supraclavicular, or Inguinal Adenopathy.  Labs: Lab Results  Component Value Date   WBC 6.4 05/14/2021   HGB 13.9 05/14/2021   HCT 40.6 05/14/2021   MCV 87 05/14/2021   PLT 228 05/14/2021    Lab Results  Component Value Date   CREATININE 0.67 05/14/2021   BUN 17 04/18/2020   NA 139 04/18/2020   K 5.0 04/18/2020   CL 101 04/18/2020   CO2 22 04/18/2020    Lab Results  Component Value Date   ALT 21 05/14/2021   AST 14 05/14/2021   GGT 32 05/03/2018   ALKPHOS 109 04/18/2020   BILITOT 0.5 04/18/2020    Lab Results  Component Value Date   CHOL 244 (H) 01/14/2021   HDL 44 01/14/2021   LDLCALC 166 (H) 01/14/2021   TRIG 184 (H) 01/14/2021   CHOLHDL 5.5 (H) 01/14/2021    Lab Results  Component Value Date   TSH 1.400 01/24/2020    No results found for: HGBA1C   Results for orders placed or performed in visit on 05/14/21  CBC with Differential/Platelet  Result Value Ref Range   WBC 6.4 3.4 - 10.8 x10E3/uL   RBC 4.67 3.77 - 5.28 x10E6/uL   Hemoglobin 13.9 11.1 - 15.9 g/dL   Hematocrit 40.6 34.0 - 46.6 %   MCV 87 79 - 97  fL   MCH 29.8 26.6 - 33.0 pg   MCHC 34.2 31.5 - 35.7 g/dL   RDW 11.6 (L) 11.7 - 15.4 %   Platelets 228 150 - 450 x10E3/uL   Neutrophils 67 Not Estab. %   Lymphs 25 Not Estab. %   Monocytes 6 Not Estab. %   Eos 0 Not Estab. %   Basos 1 Not Estab. %   Neutrophils Absolute 4.4 1.4 - 7.0 x10E3/uL   Lymphocytes Absolute 1.6 0.7 - 3.1 x10E3/uL   Monocytes Absolute 0.4 0.1 - 0.9 x10E3/uL   EOS (ABSOLUTE) 0.0 0.0 - 0.4 x10E3/uL   Basophils Absolute 0.0 0.0 - 0.2 x10E3/uL   Immature Granulocytes 1 Not Estab. %   Immature Grans (Abs) 0.0 0.0 - 0.1 x10E3/uL  Creatinine, serum  Result Value Ref Range   Creatinine, Ser 0.67 0.57 - 1.00 mg/dL   eGFR 105 >59 mL/min/1.73  AST  Result Value Ref Range   AST 14 0 - 40 IU/L  ALT  Result Value Ref Range   ALT 21 0 - 32 IU/L  Sedimentation rate  Result Value Ref Range   Sed Rate 13 0 - 40 mm/hr    Assessment:   1. Encounter for well woman exam with routine gynecological exam      Plan:  1. Well woman visit with gynecologic exam   Pap: Pap Co Test up to date.  Mammogram:  Up to date .  Colon screening: up to date. To f/u per GI recommendations.  Labs: to have performed per PCP Routine preventative health maintenance measures emphasized: Exercise/Diet/Weight control   2. Obesity The patient  has had attempts to lose weight in the past year.  Lost ~ 20 lbs.  Further weight loss plateaued. Now dealing with health issues that are limiting mobility.  Continued to encourage water exercises as patient noted a difference after returning from a beach trip recently.   3. Surgical menopause - Noting symptoms now that she is off her Paxil (stopped last year due to weight gain), however is managing.   4. Dyslipidemia  - Plans to have labs by PCP soon. Was working to decrease weight so that she did not have to initiate medications.      Rubie Maid, MD  [facility]

## 2021-05-20 NOTE — Progress Notes (Signed)
Pt present for annual exam. Pt stated that she was doing well.

## 2021-05-21 ENCOUNTER — Encounter: Payer: Self-pay | Admitting: Family

## 2021-05-21 ENCOUNTER — Other Ambulatory Visit: Payer: Self-pay

## 2021-05-21 MED ORDER — MONISTAT 1-DAY 6.5 % VA OINT: 1.0000 | TOPICAL_OINTMENT | Freq: Once | VAGINAL | 0 refills | Status: AC

## 2021-05-27 ENCOUNTER — Encounter: Payer: Self-pay | Admitting: Family

## 2021-06-02 ENCOUNTER — Ambulatory Visit: Payer: Managed Care, Other (non HMO)

## 2021-06-03 ENCOUNTER — Encounter: Payer: Self-pay | Admitting: Family

## 2021-06-05 ENCOUNTER — Other Ambulatory Visit: Payer: Self-pay | Admitting: Family

## 2021-06-05 DIAGNOSIS — G43809 Other migraine, not intractable, without status migrainosus: Secondary | ICD-10-CM

## 2021-06-05 MED ORDER — ONDANSETRON 8 MG PO TBDP: 8.0000 mg | ORAL_TABLET | Freq: Two times a day (BID) | ORAL | 1 refills | Status: DC | PRN

## 2021-06-08 ENCOUNTER — Ambulatory Visit (INDEPENDENT_AMBULATORY_CARE_PROVIDER_SITE_OTHER): Payer: Managed Care, Other (non HMO)

## 2021-06-08 ENCOUNTER — Other Ambulatory Visit: Payer: Self-pay

## 2021-06-08 DIAGNOSIS — Z23 Encounter for immunization: Secondary | ICD-10-CM | POA: Diagnosis not present

## 2021-06-08 NOTE — Progress Notes (Signed)
Patient presented for shingrix injection to left deltoid, patient voiced no concerns nor showed any signs of distress during injection. 

## 2021-06-09 ENCOUNTER — Other Ambulatory Visit: Payer: Self-pay | Admitting: Family

## 2021-06-09 DIAGNOSIS — G2581 Restless legs syndrome: Secondary | ICD-10-CM

## 2021-06-09 DIAGNOSIS — M62838 Other muscle spasm: Secondary | ICD-10-CM

## 2021-06-09 DIAGNOSIS — G8929 Other chronic pain: Secondary | ICD-10-CM

## 2021-06-09 MED ORDER — MELOXICAM 7.5 MG PO TABS: 7.5000 mg | ORAL_TABLET | Freq: Every day | ORAL | 0 refills | Status: DC | PRN

## 2021-06-09 MED ORDER — BACLOFEN 10 MG PO TABS: 10.0000 mg | ORAL_TABLET | Freq: Two times a day (BID) | ORAL | 0 refills | Status: DC

## 2021-06-09 MED ORDER — GABAPENTIN 100 MG PO CAPS: 200.0000 mg | ORAL_CAPSULE | Freq: Three times a day (TID) | ORAL | 0 refills | Status: DC

## 2021-06-10 MED ORDER — FLUCONAZOLE 150 MG PO TABS: 150.0000 mg | ORAL_TABLET | Freq: Once | ORAL | 3 refills | Status: AC

## 2021-06-10 NOTE — Telephone Encounter (Signed)
Patient called the office stating she is still having problems as discussed at the last visit.  She is requesting a RX for a cream to help.Marland KitchenPlease advise

## 2021-06-16 ENCOUNTER — Other Ambulatory Visit: Payer: Self-pay | Admitting: Family

## 2021-06-16 ENCOUNTER — Encounter: Payer: Self-pay | Admitting: Family

## 2021-06-16 DIAGNOSIS — J3089 Other allergic rhinitis: Secondary | ICD-10-CM

## 2021-06-16 DIAGNOSIS — I10 Essential (primary) hypertension: Secondary | ICD-10-CM

## 2021-06-16 DIAGNOSIS — R519 Headache, unspecified: Secondary | ICD-10-CM

## 2021-06-16 DIAGNOSIS — G43809 Other migraine, not intractable, without status migrainosus: Secondary | ICD-10-CM

## 2021-06-16 MED ORDER — PROPRANOLOL HCL 40 MG PO TABS: 40.0000 mg | ORAL_TABLET | Freq: Two times a day (BID) | ORAL | 0 refills | Status: DC

## 2021-06-16 MED ORDER — OMEPRAZOLE 20 MG PO CPDR: 20.0000 mg | DELAYED_RELEASE_CAPSULE | Freq: Every day | ORAL | 0 refills | Status: DC

## 2021-06-16 MED ORDER — SUMATRIPTAN SUCCINATE 50 MG PO TABS: 50.0000 mg | ORAL_TABLET | Freq: Once | ORAL | 0 refills | Status: DC

## 2021-06-30 ENCOUNTER — Ambulatory Visit: Payer: Managed Care, Other (non HMO) | Admitting: Nurse Practitioner

## 2021-06-30 ENCOUNTER — Encounter: Payer: Self-pay | Admitting: Nurse Practitioner

## 2021-06-30 VITALS — BP 148/88 | HR 62 | Temp 97.6°F | Resp 16 | Ht 62.0 in | Wt 240.0 lb

## 2021-06-30 DIAGNOSIS — R399 Unspecified symptoms and signs involving the genitourinary system: Secondary | ICD-10-CM | POA: Diagnosis not present

## 2021-06-30 DIAGNOSIS — R300 Dysuria: Secondary | ICD-10-CM

## 2021-06-30 LAB — POCT URINALYSIS DIPSTICK
Bilirubin, UA: NEGATIVE
Glucose, UA: NEGATIVE
Ketones, UA: NEGATIVE
Leukocytes, UA: NEGATIVE
Nitrite, UA: NEGATIVE
Protein, UA: NEGATIVE
Spec Grav, UA: 1.015 (ref 1.010–1.025)
Urobilinogen, UA: 0.2 E.U./dL
pH, UA: 6 (ref 5.0–8.0)

## 2021-06-30 MED ORDER — CEPHALEXIN 500 MG PO CAPS: 500.0000 mg | ORAL_CAPSULE | Freq: Three times a day (TID) | ORAL | 0 refills | Status: DC

## 2021-06-30 MED ORDER — PHENAZOPYRIDINE HCL 200 MG PO TABS
200.0000 mg | ORAL_TABLET | Freq: Three times a day (TID) | ORAL | 0 refills | Status: DC
Start: 2021-06-30 — End: 2021-07-29

## 2021-06-30 MED ORDER — FLUCONAZOLE 150 MG PO TABS: 150.0000 mg | ORAL_TABLET | Freq: Once | ORAL | 0 refills | Status: AC

## 2021-06-30 NOTE — Progress Notes (Signed)
Subjective:    Patient ID: Yvonne [name], female    DOB: 06/09/1966, 55 y.o.   MRN: [id number]  HPI SUBJECTIVE: Yvonne [name] is a 55 y.o. female who complains of urinary frequency, urgency and dysuria x 3 days, without flank pain, fever, chills, or abnormal vaginal discharge or bleeding. She reports that initially, 2 weeks ago she thought she had a yeast infection and used OTC medication without relief. Three days ago when the UTI symptoms started she decided she would not do OTC medications and just come to the Clinic.   Past Medical History:  Diagnosis Date   Allergy    Arthritis, rheumatoid (HCC)    Degenerative disc disease, cervical    GERD (gastroesophageal reflux disease)    Gestational diabetes    patient denies   Glaucoma    H/O bronchitis    Headache    migraine   History of ovarian cyst    Hypertension    pre-eclampsia with first child   Increased pressure in the eye, bilateral    PONV (postoperative nausea and vomiting)    Restless leg    Shingles 2021   Allergies  Allergen Reactions   Dried Figs [Ficus] Anaphylaxis    Tongue tingling & felt like throat was swelling.     Flonase [Fluticasone Propionate]     Increases eye pressure    Sulfa Antibiotics Swelling   Tussionex Pennkinetic Er [Hydrocod Polst-Cpm Polst Er] Itching    Hyperactive     Current Outpatient Medications on File Prior to Visit  Medication Sig Dispense Refill   acetaminophen (TYLENOL) 650 MG CR tablet Take 650 mg by mouth every 8 (eight) hours as needed for pain.     amLODipine (NORVASC) 5 MG tablet Take 1 tablet (5 mg total) by mouth daily. 90 tablet 1   baclofen (LIORESAL) 10 MG tablet Take 1 tablet (10 mg total) by mouth 2 (two) times daily. 30 tablet 0   clindamycin (CLINDAGEL) 1 % gel Apply topically 2 (two) times daily. 30 g 4   DULoxetine (CYMBALTA) 30 MG capsule Take 1 capsule (30 mg total) by mouth daily. Start AFTER you are weaned off paxil 90 capsule 3   fexofenadine (ALLEGRA  ALLERGY) 180 MG tablet Take 1 tablet (180 mg total) by mouth daily. 90 tablet 1   gabapentin (NEURONTIN) 100 MG capsule Take 2 capsules (200 mg total) by mouth 3 (three) times daily. 180 capsule 0   hydroxychloroquine (PLAQUENIL) 200 MG tablet Take 200 mg by mouth 2 (two) times daily.     latanoprost (XALATAN) 0.005 % ophthalmic solution Place 1 drop into both eyes at bedtime.     Melatonin 5 MG CAPS Take by mouth.     meloxicam (MOBIC) 7.5 MG tablet Take 1 tablet (7.5 mg total) by mouth daily as needed for pain. With food 90 tablet 0   montelukast (SINGULAIR) 10 MG tablet TAKE 1 TABLET BY MOUTH AT BEDTIME 90 tablet 0   nortriptyline (PAMELOR) 10 MG capsule Take 1 capsule by mouth at bedtime 90 capsule 0   omeprazole (PRILOSEC) 20 MG capsule Take 1 capsule (20 mg total) by mouth daily. 90 capsule 0   ondansetron (ZOFRAN ODT) 8 MG disintegrating tablet Take 1 tablet (8 mg total) by mouth 2 (two) times daily as needed for nausea or vomiting (migraine). 30 tablet 1   PREDNISONE PO Take by mouth.     propranolol (INDERAL) 40 MG tablet Take 1 tablet (40 mg total) by mouth  2 (two) times daily. 60 tablet 0   SUMAtriptan (IMITREX) 50 MG tablet Take 1 tablet (50 mg total) by mouth once for 1 dose. May repeat in 2 hours if headache persists or recurs. 10 tablet 0   timolol (TIMOPTIC) 0.25 % ophthalmic solution Place 1 drop into both eyes daily.     No current facility-administered medications on file prior to visit.     OBJECTIVE: Appears well, in no apparent distress.  Vital signs are normal. The abdomen is soft without tenderness, guarding, mass, rebound or organomegaly. There is mild CVAT on the left.  Urine dipstick shows Positive for LE and blood.   ASSESSMENT:  1. Dysuria - POCT Urinalysis Dipstick (CPT 81002) - Urine Culture - Ct/Ng NAA rfx Tv NAA  2. UTI symptoms  Plan: Discussed findings and plan of care with the patient. Discussed that there is a trace of blood and LE in urine but is  Nitrite negative. Discussed testing for Chlamydia and trichomonas. Patient states that she trust her partner but since we have the urine to go ahead and send it for testing.  Will start treatment with antibiotics while culture is pending. Rx Keflex 500 mg TID x 7 days, Pyridium 200 mg TID x 3 days, Diflucan 150 mg x1. If culture indicates need for change in antibiotic we will call the patient.

## 2021-07-11 ENCOUNTER — Other Ambulatory Visit: Payer: Self-pay | Admitting: Family

## 2021-07-11 DIAGNOSIS — G2581 Restless legs syndrome: Secondary | ICD-10-CM

## 2021-07-11 DIAGNOSIS — G894 Chronic pain syndrome: Secondary | ICD-10-CM

## 2021-07-22 ENCOUNTER — Encounter: Payer: Self-pay | Admitting: Family

## 2021-07-23 ENCOUNTER — Encounter: Payer: Self-pay | Admitting: Family

## 2021-07-25 ENCOUNTER — Encounter: Payer: Self-pay | Admitting: Family

## 2021-07-27 ENCOUNTER — Other Ambulatory Visit: Payer: Self-pay

## 2021-07-27 DIAGNOSIS — R42 Dizziness and giddiness: Secondary | ICD-10-CM

## 2021-07-27 MED ORDER — FEXOFENADINE HCL 180 MG PO TABS: 180.0000 mg | ORAL_TABLET | Freq: Every day | ORAL | 1 refills | Status: DC

## 2021-07-27 NOTE — Telephone Encounter (Signed)
Patient calling back in and changed to in person appointment.

## 2021-07-27 NOTE — Telephone Encounter (Signed)
Noted  

## 2021-07-29 ENCOUNTER — Ambulatory Visit: Payer: Managed Care, Other (non HMO) | Admitting: Family

## 2021-07-29 ENCOUNTER — Encounter: Payer: Self-pay | Admitting: Family

## 2021-07-29 ENCOUNTER — Other Ambulatory Visit: Payer: Self-pay

## 2021-07-29 DIAGNOSIS — R198 Other specified symptoms and signs involving the digestive system and abdomen: Secondary | ICD-10-CM | POA: Insufficient documentation

## 2021-07-29 NOTE — Patient Instructions (Addendum)
It is MOST important to drink LOTS of water and follow a HIGH fiber diet to keep foods moving through the gut.   Add Metamucil to a beverage that you drink. Please use up to 1 tablespoon (?3.5 grams fiber) 3 times per day. This medication is considered dietary fiber and bulk-forming laxative.  It is important not to like yourself to become constipated so in addition to Metamucil if you require take Colace which is a stool softener found over-the-counter, I would add that as well.  Please return stool specimen here.     Low-FODMAP Eating Plan FODMAP stands for fermentable oligosaccharides, disaccharides, monosaccharides, and polyols. These are sugars that are hard for some people to digest. A low-FODMAP eating plan may help some people who have irritable bowel syndrome (IBS) and certain other bowel (intestinal) diseases to manage their symptoms. This meal plan can be complicated to follow. Work with a diet and nutrition specialist (dietitian) to make a low-FODMAP eating plan that is right for you. A dietitian can help make sure that you get enough nutrition from this diet. What are tips for following this plan? Reading food labels Check labels for hidden FODMAPs such as: High-fructose syrup. Honey. Agave. Natural fruit flavors. Onion or garlic powder. Choose low-FODMAP foods that contain 3-4 grams of fiber per serving. Check food labels for serving sizes. Eat only one serving at a time to make sure FODMAP levels stay low. Shopping Shop with a list of foods that are recommended on this diet and make a meal plan. Meal planning Follow a low-FODMAP eating plan for up to 6 weeks, or as told by your health care provider or dietitian. To follow the eating plan: Eliminate high-FODMAP foods from your diet completely. Choose only low-FODMAP foods to eat. You will do this for 2-6 weeks. Gradually reintroduce high-FODMAP foods into your diet one at a time. Most people should wait a few days before  introducing the next new high-FODMAP food into their meal plan. Your dietitian can recommend how quickly you may reintroduce foods. Keep a daily record of what and how much you eat and drink. Make note of any symptoms that you have after eating. Review your daily record with a dietitian regularly to identify which foods you can eat and which foods you should avoid. General tips Drink enough fluid each day to keep your urine pale yellow. Avoid processed foods. These often have added sugar and may be high in FODMAPs. Avoid most dairy products, whole grains, and sweeteners. Work with a dietitian to make sure you get enough fiber in your diet. Avoid high FODMAP foods at meals to manage symptoms. Recommended foods Fruits Bananas, oranges, tangerines, lemons, limes, blueberries, raspberries, strawberries, grapes, cantaloupe, honeydew melon, kiwi, papaya, passion fruit, and pineapple. Limited amounts of dried cranberries, banana chips, and shredded coconut. Vegetables Eggplant, zucchini, cucumber, peppers, green beans, bean sprouts, lettuce, arugula, kale, Swiss chard, spinach, collard greens, bok choy, summer squash, potato, and tomato. Limited amounts of corn, carrot, and sweet potato. Green parts of scallions. Grains Gluten-free grains, such as rice, oats, buckwheat, quinoa, corn, polenta, and millet. Gluten-free pasta, bread, or cereal. Rice noodles. Corn tortillas. Meats and other proteins Unseasoned beef, pork, poultry, or fish. Eggs. Berniece Salines. Tofu (firm) and tempeh. Limited amounts of nuts and seeds, such as almonds, walnuts, [place] nuts, pecans, peanuts, nut butters, pumpkin seeds, chia seeds, and sunflower seeds. Dairy Lactose-free milk, yogurt, and kefir. Lactose-free cottage cheese and ice cream. Non-dairy milks, such as almond, coconut, hemp, and  rice milk. Non-dairy yogurt. Limited amounts of goat cheese, brie, mozzarella, parmesan, swiss, and other hard cheeses. Fats and oils Butter-free  spreads. Vegetable oils, such as olive, canola, and sunflower oil. Seasoning and other foods Artificial sweeteners with names that do not end in "ol," such as aspartame, saccharine, and stevia. Maple syrup, white table sugar, raw sugar, brown sugar, and molasses. Mayonnaise, soy sauce, and tamari. Fresh basil, coriander, parsley, rosemary, and thyme. Beverages Water and mineral water. Sugar-sweetened soft drinks. Small amounts of orange juice or cranberry juice. Black and green tea. Most dry wines. Coffee. The items listed above may not be a complete list of foods and beverages you can eat. Contact a dietitian for more information. Foods to avoid Fruits Fresh, dried, and juiced forms of apple, pear, watermelon, peach, plum, cherries, apricots, blackberries, boysenberries, figs, nectarines, and mango. Avocado. Vegetables Chicory root, artichoke, asparagus, cabbage, snow peas, Brussels sprouts, broccoli, sugar snap peas, mushrooms, celery, and cauliflower. Onions, garlic, leeks, and the white part of scallions. Grains Wheat, including kamut, durum, and semolina. Barley and bulgur. Couscous. Wheat-based cereals. Wheat noodles, bread, crackers, and pastries. Meats and other proteins Fried or fatty meat. Sausage. Cashews and pistachios. Soybeans, baked beans, black beans, chickpeas, kidney beans, fava beans, navy beans, lentils, black-eyed peas, and split peas. Dairy Milk, yogurt, ice cream, and soft cheese. Cream and sour cream. Milk-based sauces. Custard. Buttermilk. Soy milk. Seasoning and other foods Any sugar-free gum or candy. Foods that contain artificial sweeteners such as sorbitol, mannitol, isomalt, or xylitol. Foods that contain honey, high-fructose corn syrup, or agave. Bouillon, vegetable stock, beef stock, and chicken stock. Garlic and onion powder. Condiments made with onion, such as hummus, chutney, pickles, relish, salad dressing, and salsa. Tomato paste. Beverages Chicory-based  drinks. Coffee substitutes. Chamomile tea. Fennel tea. Sweet or fortified wines such as port or sherry. Diet soft drinks made with isomalt, mannitol, maltitol, sorbitol, or xylitol. Apple, pear, and mango juice. Juices with high-fructose corn syrup. The items listed above may not be a complete list of foods and beverages you should avoid. Contact a dietitian for more information. Summary FODMAP stands for fermentable oligosaccharides, disaccharides, monosaccharides, and polyols. These are sugars that are hard for some people to digest. A low-FODMAP eating plan is a short-term diet that helps to ease symptoms of certain bowel diseases. The eating plan usually lasts up to 6 weeks. After that, high-FODMAP foods are reintroduced gradually and one at a time. This can help you find out which foods may be causing symptoms. A low-FODMAP eating plan can be complicated. It is best to work with a dietitian who has experience with this type of plan. This information is not intended to replace advice given to you by your health care provider. Make sure you discuss any questions you have with your health care provider. Document Revised: 04/03/2020 Document Reviewed: 04/03/2020 Elsevier Patient Education  [place].

## 2021-07-29 NOTE — Assessment & Plan Note (Signed)
Symptoms and presentation most consistent with irritable bowel syndrome fluctuating between constipation and diarrhea.  Reassuring exam without any abdominal pain today.  we discussed that she has a history of diverticulosis seen on colonoscopy and to be extra vigilant in regards to abdominal pain, fever or chills.  She has a history of urinary incontinence, urgency we also discussed denervation of pelvic floor muscles contributing to both. We will continue to discuss urinary symptoms at follow-up.  Discussed nature of constipation and how this may be contributing to fecal urgency, loose watery bowels.  Advised fiber ,bulk forming laxative Metamucil to start.  Also advised her to use Colace if she has gone 1 day without a bowel movement.  Advised to continue FODMAP diet.  Pending stool cultures, labs she is due for repeat colonoscopy in October and I advised her to call gastroenterology to ensure this is been scheduled.  She verbalized understanding of all.

## 2021-07-29 NOTE — Progress Notes (Signed)
Subjective:    Patient ID: Yvonne [name], female    DOB: [birth year], 55 y.o.   MRN: [id number]  CC: Yvonne [name] is a 55 y.o. female who presents today for follow up.   HPI: Complains of alternating diarrhea and constipation waxes and waned, x 3 years.   She is embarrassed by this as she has had episodes of not making to the bathroom on time. Last week, she had a formed bowel before she get to the bathroom.  She may go 3 days before a bowel movement and having hard formed stool and then the stool will turn brown clear like when he drank water.   Endorses abdominal distention and lower abdominal pain during bouts of diarrhea . Fluctuates and constipation and diarrhea.Last BM last night normal brown, formed.   She is limiting gluten.   No fever, dysuria, hematuria, nonbloody diarrhea, weight loss.   Endorses urinary continence with sneezing, coughing. She is wearing a bladder pad.   No h/o dm, spinal cord injury  No increased anxiety. No artifical sugars.    Colonoscopy done on 03/24/2021, Dr Bonna Gains.  Poor prep awaiting repeated in 6 months with a 2-day prep.  MRI lumbar 04/2020 Severe L4-L5 atherosclerosis with trace anterolithiasis.  No stenosis.  Mild disc generation without stenosis Keflex 07/22/21 for UTI.    HISTORY:  Past Medical History:  Diagnosis Date   Allergy    Arthritis, rheumatoid (HCC)    Degenerative disc disease, cervical    GERD (gastroesophageal reflux disease)    Gestational diabetes    patient denies   Glaucoma    H/O bronchitis    Headache    migraine   History of ovarian cyst    Hypertension    pre-eclampsia with first child   Increased pressure in the eye, bilateral    PONV (postoperative nausea and vomiting)    Restless leg    Shingles 2021   Past Surgical History:  Procedure Laterality Date   CERVICAL POLYPECTOMY N/A 04/11/2017   Procedure: CERVICAL POLYPECTOMY;  Surgeon: Rubie Maid, MD;  Location: [facility];  Service: Gynecology;   Laterality: N/A;   COLONOSCOPY WITH PROPOFOL N/A 03/24/2021   Procedure: COLONOSCOPY WITH BIOPSY;  Surgeon: Virgel Manifold, MD;  Location: [place];  Service: Endoscopy;  Laterality: N/A;   HYSTEROSCOPY WITH D & C N/A 04/11/2017   Procedure: DILATATION AND CURETTAGE /HYSTEROSCOPY;  Surgeon: Rubie Maid, MD;  Location: [facility];  Service: Gynecology;  Laterality: N/A;   IUD REMOVAL N/A 05/05/2017   Procedure: INTRAUTERINE DEVICE (IUD) REMOVAL;  Surgeon: Rubie Maid, MD;  Location: [facility];  Service: Gynecology;  Laterality: N/A;   LAPAROSCOPIC SALPINGO OOPHERECTOMY Bilateral 05/05/2017   Procedure: LAPAROSCOPIC BILATERAL SALPINGO OOPHORECTOMY;  Surgeon: Rubie Maid, MD;  Location: [facility];  Service: Gynecology;  Laterality: Bilateral;   LAPAROSCOPY N/A 04/11/2017   Procedure: LAPAROSCOPY DIAGNOSTIC;  Surgeon: Rubie Maid, MD;  Location: [facility];  Service: Gynecology;  Laterality: N/A;   PARTIAL HYSTERECTOMY     Ovaries and Tubes   POLYPECTOMY N/A 03/24/2021   Procedure: POLYPECTOMY;  Surgeon: Virgel Manifold, MD;  Location: [place];  Service: Endoscopy;  Laterality: N/A;   TUBAL LIGATION     Family History  Problem Relation Age of Onset   Schizophrenia Mother    Bipolar disorder Mother    Alcohol abuse Father    Hypertension Father    Seizures Paternal Grandmother    Diabetes Paternal Grandmother    Hypertension  Brother    Breast cancer Brother    Hypertension Maternal Grandmother     Allergies: Dried figs [ficus], Flonase [fluticasone propionate], Sulfa antibiotics, and Tussionex pennkinetic er [employer] polst-cpm polst er] Current Outpatient Medications on File Prior to Visit  Medication Sig Dispense Refill   acetaminophen (TYLENOL) 650 MG CR tablet Take 650 mg by mouth every 8 (eight) hours as needed for pain.     amLODipine (NORVASC) 5 MG tablet Take 1 tablet (5 mg total) by mouth daily. 90 tablet 1   baclofen (LIORESAL) 10 MG tablet Take 1  tablet (10 mg total) by mouth 2 (two) times daily. 30 tablet 0   clindamycin (CLINDAGEL) 1 % gel Apply topically 2 (two) times daily. 30 g 4   DULoxetine (CYMBALTA) 30 MG capsule TAKE 1 CAPSULE BY MOUTH ONCE DAILY START  AFTER  YOU  ARE  WEANED  OFF  PAXIL 90 capsule 0   fexofenadine (ALLEGRA ALLERGY) 180 MG tablet Take 1 tablet (180 mg total) by mouth daily. 90 tablet 1   gabapentin (NEURONTIN) 100 MG capsule TAKE 2 CAPSULES BY MOUTH THREE TIMES DAILY 180 capsule 0   hydroxychloroquine (PLAQUENIL) 200 MG tablet Take 200 mg by mouth 2 (two) times daily.     latanoprost (XALATAN) 0.005 % ophthalmic solution Place 1 drop into both eyes at bedtime.     Melatonin 5 MG CAPS Take by mouth.     meloxicam (MOBIC) 7.5 MG tablet Take 1 tablet (7.5 mg total) by mouth daily as needed for pain. With food 90 tablet 0   montelukast (SINGULAIR) 10 MG tablet TAKE 1 TABLET BY MOUTH AT BEDTIME 90 tablet 0   nortriptyline (PAMELOR) 10 MG capsule Take 1 capsule by mouth at bedtime 90 capsule 0   omeprazole (PRILOSEC) 20 MG capsule Take 1 capsule (20 mg total) by mouth daily. 90 capsule 0   ondansetron (ZOFRAN ODT) 8 MG disintegrating tablet Take 1 tablet (8 mg total) by mouth 2 (two) times daily as needed for nausea or vomiting (migraine). 30 tablet 1   PREDNISONE PO Take by mouth.     propranolol (INDERAL) 40 MG tablet Take 1 tablet (40 mg total) by mouth 2 (two) times daily. 60 tablet 0   RINVOQ 15 MG TB24 Take 1 tablet by mouth daily.     timolol (TIMOPTIC) 0.25 % ophthalmic solution Place 1 drop into both eyes daily.     SUMAtriptan (IMITREX) 50 MG tablet Take 1 tablet (50 mg total) by mouth once for 1 dose. May repeat in 2 hours if headache persists or recurs. 10 tablet 0   No current facility-administered medications on file prior to visit.    Social History   Tobacco Use   Smoking status: Former    Packs/day: 1.00    Years: 6.00    Pack years: 6.00    Types: Cigarettes    Quit date: 05/29/2000     Years since quitting: 21.1   Smokeless tobacco: Never   Tobacco comments:    1/2-1 PPD  Vaping Use   Vaping Use: Never used  Substance Use Topics   Alcohol use: No    Alcohol/week: 0.0 standard drinks   Drug use: No    Review of Systems  Constitutional:  Negative for chills and fever.  Respiratory:  Negative for cough.   Cardiovascular:  Negative for chest pain and palpitations.  Gastrointestinal:  Positive for constipation and diarrhea. Negative for abdominal distention, blood in stool, nausea and vomiting.  Genitourinary:  Positive for urgency. Negative for dysuria, frequency and hematuria.     Objective:    BP 122/70 (BP Location: Left Arm, Patient Position: Sitting, Cuff Size: Large)   Pulse 62   Temp 98.9 F (37.2 C) (Oral)   Ht '5\' 2"'$  (1.575 m)   Wt 246 lb 9.6 oz (111.9 kg)   LMP 05/06/2017 (Exact Date) Comment: BSO  SpO2 96%   BMI 45.10 kg/m  BP Readings from Last 3 Encounters:  07/29/21 122/70  06/30/21 (!) 148/88  05/20/21 120/84   Wt Readings from Last 3 Encounters:  07/29/21 246 lb 9.6 oz (111.9 kg)  06/30/21 240 lb (108.9 kg)  05/20/21 239 lb 14.4 oz (108.8 kg)    Physical Exam Vitals reviewed.  Constitutional:      Appearance: Normal appearance. She is well-developed.  Eyes:     Conjunctiva/sclera: Conjunctivae normal.  Cardiovascular:     Rate and Rhythm: Normal rate and regular rhythm.     Pulses: Normal pulses.     Heart sounds: Normal heart sounds.  Pulmonary:     Effort: Pulmonary effort is normal.     Breath sounds: Normal breath sounds. No wheezing, rhonchi or rales.  Abdominal:     General: Bowel sounds are normal. There is no distension.     Palpations: Abdomen is soft. Abdomen is not rigid. There is no fluid wave or mass.     Tenderness: There is no abdominal tenderness. There is no guarding or rebound. Negative signs include Murphy's sign.  Skin:    General: Skin is warm and dry.  Neurological:     Mental Status: She is alert.   Psychiatric:        Speech: Speech normal.        Behavior: Behavior normal.        Thought Content: Thought content normal.       Assessment & Plan:   Problem List Items Addressed This Visit       Other   Alternating constipation and diarrhea    Symptoms and presentation most consistent with irritable bowel syndrome fluctuating between constipation and diarrhea.  Reassuring exam without any abdominal pain today.  we discussed that she has a history of diverticulosis seen on colonoscopy and to be extra vigilant in regards to abdominal pain, fever or chills.  She has a history of urinary incontinence, urgency we also discussed denervation of pelvic floor muscles contributing to both. We will continue to discuss urinary symptoms at follow-up.  Discussed nature of constipation and how this may be contributing to fecal urgency, loose watery bowels.  Advised fiber ,bulk forming laxative Metamucil to start.  Also advised her to use Colace if she has gone 1 day without a bowel movement.  Advised to continue FODMAP diet.  Pending stool cultures, labs she is due for repeat colonoscopy in October and I advised her to call gastroenterology to ensure this is been scheduled.  She verbalized understanding of all.      Relevant Orders   CBC with Differential/Platelet   Comprehensive metabolic panel   Hemoglobin A1c   TSH   Urinalysis, Routine w reflex microscopic   Celiac Disease Ab Screen w/Rfx   GI pathogen panel by PCR, stool     I have discontinued Doretha E. Broecker's phenazopyridine and cephALEXin. I am also having her maintain her latanoprost, hydroxychloroquine, Melatonin, timolol, acetaminophen, amLODipine, clindamycin, PREDNISONE PO, ondansetron, meloxicam, baclofen, nortriptyline, montelukast, omeprazole, SUMAtriptan, propranolol, gabapentin, DULoxetine, fexofenadine, and Rinvoq.   No orders of  the defined types were placed in this encounter.   Return precautions given.   Risks,  benefits, and alternatives of the medications and treatment plan prescribed today were discussed, and patient expressed understanding.   Education regarding symptom management and diagnosis given to patient on AVS.  Continue to follow with Burnard Hawthorne, FNP for routine health maintenance.   [name] and I agreed with plan.   Mable Paris, FNP  I have spent 26 minutes with a patient including precharting, exam, reviewing medical records, and discussion plan of care.

## 2021-07-30 ENCOUNTER — Encounter: Payer: Self-pay | Admitting: Family

## 2021-07-30 ENCOUNTER — Other Ambulatory Visit: Payer: Self-pay

## 2021-07-30 DIAGNOSIS — R42 Dizziness and giddiness: Secondary | ICD-10-CM

## 2021-07-30 MED ORDER — FEXOFENADINE HCL 180 MG PO TABS: 180.0000 mg | ORAL_TABLET | Freq: Every day | ORAL | 1 refills | Status: DC

## 2021-07-31 ENCOUNTER — Encounter: Payer: Self-pay | Admitting: Family

## 2021-07-31 NOTE — Telephone Encounter (Signed)
Patient is calling to check on the status of the message below.She stated this issue is getting worse from her appt on 07/29/21.

## 2021-08-03 ENCOUNTER — Encounter: Payer: Self-pay | Admitting: Family

## 2021-08-04 ENCOUNTER — Encounter: Payer: Self-pay | Admitting: Gastroenterology

## 2021-08-04 ENCOUNTER — Telehealth: Payer: Self-pay | Admitting: Gastroenterology

## 2021-08-04 ENCOUNTER — Other Ambulatory Visit: Payer: Self-pay

## 2021-08-04 ENCOUNTER — Telehealth: Payer: Self-pay | Admitting: Family

## 2021-08-04 ENCOUNTER — Other Ambulatory Visit: Payer: Self-pay | Admitting: Gastroenterology

## 2021-08-04 ENCOUNTER — Encounter: Payer: Self-pay | Admitting: Family

## 2021-08-04 DIAGNOSIS — Z86010 Personal history of colonic polyps: Secondary | ICD-10-CM

## 2021-08-04 DIAGNOSIS — M62838 Other muscle spasm: Secondary | ICD-10-CM

## 2021-08-04 MED ORDER — PEG 3350-KCL-NA BICARB-NACL 420 G PO SOLR: ORAL | 0 refills | Status: DC

## 2021-08-04 MED ORDER — BACLOFEN 10 MG PO TABS
10.0000 mg | ORAL_TABLET | Freq: Two times a day (BID) | ORAL | 0 refills | Status: DC
Start: 2021-08-04 — End: 2021-08-12

## 2021-08-04 NOTE — Telephone Encounter (Signed)
Called and left two messages to see how she was doing 08/02/21  I was able to speak with pt 08/03/21  She felt well, denies fevers, chills, abdominal pain.  She had a large, formed and soft bowel movement 9 /01/2021 and is felt well since.  She has not required Colace, or MiraLAX.  She is been monitoring dietary triggers including dairy.  No diarrhea.  She does not plan to return stool sample unless diarrhea recurs.    She is got lab scheduled through her work for 08/06/21   Plan Advised patient to use Colace to prevent constipation and ensure she has a soft formed bowel movement every day Patient will call gastroenterology to schedule follow-up Labs including celiac screen to be done this week She will let me know of any fever, chills, abdominal pain which would warrant abdominal imaging.

## 2021-08-04 NOTE — Telephone Encounter (Signed)
Yvonne [name] has spoken with patient.

## 2021-08-04 NOTE — Telephone Encounter (Signed)
Left message on voicemail.

## 2021-08-04 NOTE — Telephone Encounter (Signed)
Pt. Calling about scheduling a colonoscopy.

## 2021-08-05 ENCOUNTER — Encounter: Payer: Self-pay | Admitting: Family

## 2021-08-06 ENCOUNTER — Other Ambulatory Visit: Payer: Self-pay | Admitting: Family

## 2021-08-06 DIAGNOSIS — G894 Chronic pain syndrome: Secondary | ICD-10-CM

## 2021-08-06 DIAGNOSIS — G2581 Restless legs syndrome: Secondary | ICD-10-CM

## 2021-08-10 ENCOUNTER — Other Ambulatory Visit: Payer: Self-pay

## 2021-08-10 ENCOUNTER — Encounter: Payer: Self-pay | Admitting: Gastroenterology

## 2021-08-10 ENCOUNTER — Ambulatory Visit (INDEPENDENT_AMBULATORY_CARE_PROVIDER_SITE_OTHER): Payer: Managed Care, Other (non HMO)

## 2021-08-10 ENCOUNTER — Ambulatory Visit (INDEPENDENT_AMBULATORY_CARE_PROVIDER_SITE_OTHER): Payer: Managed Care, Other (non HMO) | Admitting: Gastroenterology

## 2021-08-10 VITALS — BP 116/75 | HR 55 | Temp 97.7°F | Ht 62.5 in | Wt 243.2 lb

## 2021-08-10 DIAGNOSIS — K219 Gastro-esophageal reflux disease without esophagitis: Secondary | ICD-10-CM | POA: Diagnosis not present

## 2021-08-10 DIAGNOSIS — Z23 Encounter for immunization: Secondary | ICD-10-CM

## 2021-08-10 DIAGNOSIS — R159 Full incontinence of feces: Secondary | ICD-10-CM | POA: Diagnosis not present

## 2021-08-10 NOTE — Progress Notes (Signed)
Patient presented for Shingrix injection to left deltoid, patient voiced no concerns nor showed any signs of distress during injection.

## 2021-08-10 NOTE — Progress Notes (Signed)
Yvonne [name] [address]  [place]  [place], [place] [address]  Main: [phone]  Fax: [phone]   Gastroenterology Consultation  Referring Provider:     Burnard Hawthorne, FNP Primary Care Physician:  Burnard Hawthorne, FNP Reason for Consultation:     Altered bowel habits        HPI:    Chief Complaint  Patient presents with   Bloated    Fluctuating between diarrhea and constipation...     Yvonne [name] is a 55 y.o. y/o female referred for consultation & management  by Dr. Vidal Schwalbe, Yvetta Coder, FNP.  Patient reports that for years, she will have intermittent episodes where she will have fecal incontinence, and then this resolves and does not recur for 1 year.  However, recently the symptoms have restarted as of the last 1 to 2 months and are worse than before, and that they have not resolved as quickly as before.  She reports not having a bowel movement for 2 to 3 days and feeling bloated and having right-sided abdominal pain during those days.  She will eventually have a bowel movement, that is hard, followed by subsequent multiple loose bowel movements, including watery stools.  She has had episodes of fecal incontinence associated with this.  She also has chronic urinary incontinence.  No blood in stool.  No vomiting.  No weight loss.  No family history of colon cancer.  She underwent screening colonoscopy in April 2022, with polyps removed and this showed a fair prep, repeat recommended in 6 months with 2-day prep.  She describes drinking sodas during the day, and cranberry juice.  She reports 2 history of 2 vaginal deliveries in the past, with forceps used during one of them, and requiring stitches.  She does not like using Metamucil.  She tried MiraLAX 1 time but states it was too strong.  She states she may notice more symptoms of diarrhea with lactose products, and sometimes certain gluten containing products, specifically certain breads.  PCP note  by Yvonne [name] from 07/29/2021 reviewed and reports alternating constipation and diarrhea.  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, bilateral    PONV (postoperative nausea and vomiting)    Restless leg    Shingles 2021    Past Surgical History:  Procedure Laterality Date   CERVICAL POLYPECTOMY N/A 04/11/2017   Procedure: CERVICAL POLYPECTOMY;  Surgeon: Rubie Maid, MD;  Location: [facility];  Service: Gynecology;  Laterality: N/A;   COLONOSCOPY WITH PROPOFOL N/A 03/24/2021   Procedure: COLONOSCOPY WITH BIOPSY;  Surgeon: Virgel Manifold, MD;  Location: [facility];  Service: Endoscopy;  Laterality: N/A;   HYSTEROSCOPY WITH D & C N/A 04/11/2017   Procedure: DILATATION AND CURETTAGE /HYSTEROSCOPY;  Surgeon: Rubie Maid, MD;  Location: [facility];  Service: Gynecology;  Laterality: N/A;   IUD REMOVAL N/A 05/05/2017   Procedure: INTRAUTERINE DEVICE (IUD) REMOVAL;  Surgeon: Marcelline Mates,  Dolphus Jenny, MD;  Location: [facility];  Service: Gynecology;  Laterality: N/A;   LAPAROSCOPIC SALPINGO OOPHERECTOMY Bilateral 05/05/2017   Procedure: LAPAROSCOPIC BILATERAL SALPINGO OOPHORECTOMY;  Surgeon: Rubie Maid, MD;  Location: [facility];  Service: Gynecology;  Laterality: Bilateral;   LAPAROSCOPY N/A 04/11/2017   Procedure: LAPAROSCOPY DIAGNOSTIC;  Surgeon: Rubie Maid, MD;  Location: [facility];  Service: Gynecology;  Laterality: N/A;   PARTIAL HYSTERECTOMY     Ovaries and Tubes   POLYPECTOMY N/A 03/24/2021    Procedure: POLYPECTOMY;  Surgeon: Virgel Manifold, MD;  Location: [facility];  Service: Endoscopy;  Laterality: N/A;   TUBAL LIGATION      Prior to Admission medications   Medication Sig Start Date End Date Taking? Authorizing Provider  acetaminophen (TYLENOL) 650 MG CR tablet Take 650 mg by mouth every 8 (eight) hours as needed for pain.    [provider]  amLODipine (NORVASC) 5 MG tablet Take 1 tablet (5 mg total) by mouth daily. 04/01/21   Burnard Hawthorne, FNP  baclofen (LIORESAL) 10 MG tablet Take 1 tablet (10 mg total) by mouth 2 (two) times daily. 08/04/21   Burnard Hawthorne, FNP  clindamycin (CLINDAGEL) 1 % gel Apply topically 2 (two) times daily. 04/21/21   Rubie Maid, MD  DULoxetine (CYMBALTA) 30 MG capsule TAKE 1 CAPSULE BY MOUTH ONCE DAILY START  AFTER  YOU  ARE  WEANED  OFF  PAXIL 07/14/21   Burnard Hawthorne, FNP  fexofenadine [facility] ALLERGY) 180 MG tablet Take 1 tablet (180 mg total) by mouth daily. 07/30/21   Burnard Hawthorne, FNP  gabapentin (NEURONTIN) 100 MG capsule TAKE 2 CAPSULES BY MOUTH THREE TIMES DAILY 07/14/21   Burnard Hawthorne, FNP  hydroxychloroquine (PLAQUENIL) 200 MG tablet Take 200 mg by mouth 2 (two) times daily. 10/13/19   [provider]  latanoprost (XALATAN) 0.005 % ophthalmic solution Place 1 drop into both eyes at bedtime.    [provider]  Melatonin 5 MG CAPS Take by mouth.    [provider]  meloxicam (MOBIC) 7.5 MG tablet Take 1 tablet (7.5 mg total) by mouth daily as needed for pain. With food 06/09/21   Burnard Hawthorne, FNP  montelukast (SINGULAIR) 10 MG tablet TAKE 1 TABLET BY MOUTH AT BEDTIME 06/18/21   Burnard Hawthorne, FNP  nortriptyline (PAMELOR) 10 MG capsule Take 1 capsule by mouth at bedtime 06/18/21   Burnard Hawthorne, FNP  omeprazole (PRILOSEC) 20 MG capsule Take 1 capsule (20 mg total) by mouth daily. 06/16/21   Burnard Hawthorne, FNP  ondansetron (ZOFRAN ODT) 8 MG disintegrating  tablet Take 1 tablet (8 mg total) by mouth 2 (two) times daily as needed for nausea or vomiting (migraine). 06/05/21   Burnard Hawthorne, FNP  polyethylene glycol-electrolytes (NULYTELY) 420 g solution 1/2 jug 2 days prior to procedure between Noon and 4pm, 1/2 jug 5pm night before procedure, 1/2 jug day of procedure 5 hours before 08/04/21   Virgel Manifold, MD  PREDNISONE PO Take by mouth.    [provider]  propranolol (INDERAL) 40 MG tablet Take 1 tablet (40 mg total) by mouth 2 (two) times daily. 06/16/21   Burnard Hawthorne, FNP  RINVOQ 15 MG TB24 Take 1 tablet by mouth daily. 07/07/21   [provider]  SUMAtriptan (IMITREX) 50 MG tablet Take 1 tablet (50 mg total) by mouth once for 1 dose. May repeat in 2 hours if headache persists or recurs. 06/16/21 06/16/21  Burnard Hawthorne, FNP  timolol (TIMOPTIC) 0.25 % ophthalmic solution Place 1 drop into both eyes daily. 12/15/20   [provider]    Family History  Problem Relation Age of Onset   Schizophrenia Mother    Bipolar disorder Mother    Alcohol abuse Father    Hypertension Father    Seizures Paternal Grandmother    Diabetes Paternal Grandmother    Hypertension Brother    Breast cancer Brother    Hypertension Maternal Grandmother      Social History   Tobacco Use   Smoking status: Former    Packs/day: 1.00    Years: 6.00    Pack years: 6.00    Types: Cigarettes    Quit date: 05/29/2000    Years since quitting: 21.2   Smokeless tobacco: Never   Tobacco comments:    1/2-1 PPD  Vaping Use   Vaping Use: Never used  Substance Use Topics   Alcohol use: No    Alcohol/week: 0.0 standard drinks   Drug use: No    Allergies as of 08/10/2021 - Review Complete 07/29/2021  Allergen Reaction Noted   Dried figs [ficus] Anaphylaxis 08/05/2020   Flonase [fluticasone propionate]  03/04/2017   Sulfa antibiotics Swelling 10/08/2015   Tussionex pennkinetic er [hydrocod polst-cpm polst er] Itching  10/08/2015    Review of Systems:    All systems reviewed and negative except where noted in HPI.   Physical Exam:  Constitutional: General:   Alert,  Well-developed, well-nourished, pleasant and cooperative in NAD BP 116/75   Pulse (!) 55   Temp 97.7 F (36.5 C) (Temporal)   Ht 5' 2.5" (1.588 m)   Wt 243 lb 3.2 oz (110.3 kg)   LMP 05/06/2017 (Exact Date) Comment: BSO  BMI 43.77 kg/m   Eyes:  Sclera clear, no icterus.   Conjunctiva pink. PERRLA  Ears:  No scars, lesions or masses, Normal auditory acuity. Nose:  No deformity, discharge, or lesions. Mouth:  No deformity or lesions, oropharynx pink & moist.  Neck:  Supple; no masses or thyromegaly.  Respiratory: Normal respiratory effort, Normal percussion  Gastrointestinal: Soft, non-tender and non-distended without masses, hepatosplenomegaly or hernias noted.  No guarding or rebound tenderness.     Cardiac: No clubbing or edema.  No cyanosis. Normal posterior tibial pedal pulses noted.  Lymphatic:  No significant cervical or axillary adenopathy.  Psych:  Alert and cooperative. Normal mood and affect.  Musculoskeletal:  Normal gait. Head normocephalic, atraumatic. Symmetrical without gross deformities. 5/5 Upper and Lower extremity strength bilaterally.  Skin: Warm. Intact without significant lesions or rashes. No jaundice.  Neurologic:  Face symmetrical, tongue midline, Normal sensation to touch;  grossly normal neurologically.  Psych:  Alert and oriented x3, Alert and cooperative. Normal mood and affect.   Labs: CBC    Component Value Date/Time   WBC 6.4 05/14/2021 0846   WBC 5.9 05/05/2017 1104   RBC 4.67 05/14/2021 0846   RBC 5.01 05/05/2017 1104   HGB 13.9 05/14/2021 0846   HCT 40.6 05/14/2021 0846   PLT 228 05/14/2021 0846   MCV 87 05/14/2021 0846   MCH 29.8 05/14/2021 0846   MCH 30.0 05/05/2017 1104   MCHC 34.2 05/14/2021 0846   MCHC 34.4 05/05/2017 1104   RDW 11.6 (L) 05/14/2021 0846   LYMPHSABS 1.6  05/14/2021 0846   EOSABS 0.0 05/14/2021 0846   BASOSABS 0.0 05/14/2021 0846   CMP     Component Value Date/Time   NA 139 04/18/2020 0815  K 5.0 04/18/2020 0815   CL 101 04/18/2020 0815   CO2 22 04/18/2020 0815   GLUCOSE 105 (H) 01/14/2021 0850   BUN 17 04/18/2020 0815   CREATININE 0.67 05/14/2021 0846   CALCIUM 9.8 04/18/2020 0815   PROT 6.8 04/18/2020 0815   ALBUMIN 4.3 04/18/2020 0815   AST 14 05/14/2021 0846   ALT 21 05/14/2021 0846   ALKPHOS 109 04/18/2020 0815   BILITOT 0.5 04/18/2020 0815   GFRNONAA 101 04/18/2020 0815   GFRAA 117 04/18/2020 0815     Imaging Studies: No results found.  Assessment and Plan:   HIMA LINNEY is a 55 y.o. y/o female has been referred for altered bowel habits  Patient has a component of pelvic floor muscle dysfunction, overflow diarrhea, and diet contributing to her symptoms  I have advised her to avoid beverages with sugar or sugar substitutes, and avoid gum and candy as this can contribute to loose stools  We discussed pelvic floor physical therapy, and patient does not want pelvic floor physical therapy referral at this time and would like to try Kegel's exercises at home herself.  If she changes her mind, I have advised her to reach out to [place] and we can place official referral  I have advised her to start taking MiraLAX, half a dose every day or every other day and change the dose as needed after 3 to 5 days with goal of 1-2 soft bowel movements every day or every other day  Patient is already scheduled for her colonoscopy given fair prep on last exam, and microscopic colitis biopsies can be done at that time  She also reports chronic GERD and is having breakthrough symptoms despite taking PPI daily for years  EGD can help evaluate for Barrett's, rule out hiatal hernia  I have discussed alternative options, risks & benefits,  which include, but are not limited to, bleeding, infection, perforation,respiratory complication & drug  reaction.  The patient agrees with this plan & written consent will be obtained.    Patient states lab work ordered by PCP was submitted and results should be back this week.  Follow-up pending results.  Celiac disease antibody screen was ordered by PCP and this would allow for evaluation of celiac disease.  I did discuss allergy immunology referral to evaluate for food sensitivities, but patient refuses at this time  Most recent blood work otherwise normal, including normal transaminases, hemoglobin and platelets  Dr Yvonne [name]  Speech recognition software was used to dictate the above note.

## 2021-08-11 ENCOUNTER — Encounter: Payer: Self-pay | Admitting: Family

## 2021-08-12 ENCOUNTER — Encounter: Payer: Self-pay | Admitting: Family

## 2021-08-12 ENCOUNTER — Other Ambulatory Visit: Payer: Self-pay | Admitting: Family

## 2021-08-12 DIAGNOSIS — G43809 Other migraine, not intractable, without status migrainosus: Secondary | ICD-10-CM

## 2021-08-12 DIAGNOSIS — M62838 Other muscle spasm: Secondary | ICD-10-CM

## 2021-08-12 DIAGNOSIS — G2581 Restless legs syndrome: Secondary | ICD-10-CM

## 2021-08-17 ENCOUNTER — Encounter: Payer: Self-pay | Admitting: Family

## 2021-08-25 ENCOUNTER — Telehealth: Payer: Self-pay | Admitting: Family

## 2021-08-25 ENCOUNTER — Telehealth: Payer: Managed Care, Other (non HMO) | Admitting: Physician Assistant

## 2021-08-25 ENCOUNTER — Encounter: Payer: Self-pay | Admitting: Family

## 2021-08-25 DIAGNOSIS — B9689 Other specified bacterial agents as the cause of diseases classified elsewhere: Secondary | ICD-10-CM

## 2021-08-25 DIAGNOSIS — J019 Acute sinusitis, unspecified: Secondary | ICD-10-CM

## 2021-08-25 MED ORDER — PROMETHAZINE-DM 6.25-15 MG/5ML PO SYRP
5.0000 mL | ORAL_SOLUTION | Freq: Four times a day (QID) | ORAL | 0 refills | Status: DC | PRN
Start: 2021-08-25 — End: 2021-10-21

## 2021-08-25 MED ORDER — AMOXICILLIN-POT CLAVULANATE 875-125 MG PO TABS: 1.0000 | ORAL_TABLET | Freq: Two times a day (BID) | ORAL | 0 refills | Status: DC

## 2021-08-25 NOTE — Patient Instructions (Signed)
Yvonne [name], thank you for joining Mar Daring, PA-C for today's virtual visit.  While this provider is not your primary care provider (PCP), if your PCP is located in our provider database this encounter information will be shared with them immediately following your visit.  Consent: (Patient) Yvonne [name] provided verbal consent for this virtual visit at the beginning of the encounter.  Current Medications:  Current Outpatient Medications:    amoxicillin-clavulanate (AUGMENTIN) 875-125 MG tablet, Take 1 tablet by mouth 2 (two) times daily., Disp: 14 tablet, Rfl: 0   promethazine-dextromethorphan (PROMETHAZINE-DM) 6.25-15 MG/5ML syrup, Take 5 mLs by mouth 4 (four) times daily as needed for cough., Disp: 118 mL, Rfl: 0   acetaminophen (TYLENOL) 650 MG CR tablet, Take 650 mg by mouth every 8 (eight) hours as needed for pain., Disp: , Rfl:    amLODipine (NORVASC) 5 MG tablet, Take 1 tablet (5 mg total) by mouth daily., Disp: 90 tablet, Rfl: 1   baclofen (LIORESAL) 10 MG tablet, Take 1 tablet by mouth twice daily, Disp: 30 tablet, Rfl: 0   clindamycin (CLINDAGEL) 1 % gel, Apply topically 2 (two) times daily., Disp: 30 g, Rfl: 4   DULoxetine (CYMBALTA) 30 MG capsule, TAKE 1 CAPSULE BY MOUTH ONCE DAILY START  AFTER  YOU  ARE  WEANED  OFF  PAXIL, Disp: 90 capsule, Rfl: 0   fexofenadine (ALLEGRA ALLERGY) 180 MG tablet, Take 1 tablet (180 mg total) by mouth daily., Disp: 90 tablet, Rfl: 1   gabapentin (NEURONTIN) 100 MG capsule, TAKE 2 CAPSULES BY MOUTH THREE TIMES DAILY, Disp: 180 capsule, Rfl: 0   hydroxychloroquine (PLAQUENIL) 200 MG tablet, Take 200 mg by mouth 2 (two) times daily., Disp: , Rfl:    latanoprost (XALATAN) 0.005 % ophthalmic solution, Place 1 drop into both eyes at bedtime., Disp: , Rfl:    Melatonin 5 MG CAPS, Take by mouth., Disp: , Rfl:    meloxicam (MOBIC) 7.5 MG tablet, Take 1 tablet (7.5 mg total) by mouth daily as needed for pain. With food, Disp: 90 tablet, Rfl:  0   montelukast (SINGULAIR) 10 MG tablet, TAKE 1 TABLET BY MOUTH AT BEDTIME, Disp: 90 tablet, Rfl: 0   nortriptyline (PAMELOR) 10 MG capsule, Take 1 capsule by mouth at bedtime, Disp: 90 capsule, Rfl: 0   omeprazole (PRILOSEC) 20 MG capsule, Take 1 capsule (20 mg total) by mouth daily., Disp: 90 capsule, Rfl: 0   ondansetron (ZOFRAN ODT) 8 MG disintegrating tablet, Take 1 tablet (8 mg total) by mouth 2 (two) times daily as needed for nausea or vomiting (migraine)., Disp: 30 tablet, Rfl: 1   polyethylene glycol-electrolytes (NULYTELY) 420 g solution, 1/2 jug 2 days prior to procedure between Noon and 4pm, 1/2 jug 5pm night before procedure, 1/2 jug day of procedure 5 hours before, Disp: 8000 mL, Rfl: 0   PREDNISONE PO, Take by mouth., Disp: , Rfl:    propranolol (INDERAL) 40 MG tablet, Take 1 tablet (40 mg total) by mouth 2 (two) times daily., Disp: 60 tablet, Rfl: 0   RINVOQ 15 MG TB24, Take 1 tablet by mouth daily., Disp: , Rfl:    SUMAtriptan (IMITREX) 50 MG tablet, TAKE 1 TABLET BY MOUTH ONCE FOR 1 DOSE. MAY REPEAT IN 2 HOURS IF HEADACHE PERSISTS OR RECURS, Disp: 10 tablet, Rfl: 0   timolol (TIMOPTIC) 0.25 % ophthalmic solution, Place 1 drop into both eyes daily., Disp: , Rfl:    Medications ordered in this encounter:  Meds ordered this  encounter  Medications   amoxicillin-clavulanate (AUGMENTIN) 875-125 MG tablet    Sig: Take 1 tablet by mouth 2 (two) times daily.    Dispense:  14 tablet    Refill:  0    Order Specific Question:   Supervising Provider    Answer:   Noemi Chapel [[id number]]   promethazine-dextromethorphan (PROMETHAZINE-DM) 6.25-15 MG/5ML syrup    Sig: Take 5 mLs by mouth 4 (four) times daily as needed for cough.    Dispense:  118 mL    Refill:  0    Order Specific Question:   Supervising Provider    Answer:   Sabra Heck, BRIAN [[id number]]     *If you need refills on other medications prior to your next appointment, please contact your pharmacy*  Follow-Up: Call back or seek an  in-person evaluation if the symptoms worsen or if the condition fails to improve as anticipated.  Other Instructions Sinusitis, Adult Sinusitis is soreness and swelling (inflammation) of your sinuses. Sinuses are hollow spaces in the bones around your face. They are located: Around your eyes. In the middle of your forehead. Behind your nose. In your cheekbones. Your sinuses and nasal passages are lined with a fluid called mucus. Mucus drains out of your sinuses. Swelling can trap mucus in your sinuses. This lets germs (bacteria, virus, or fungus) grow, which leads to infection. Most of the time, this condition is caused by a virus. What are the causes? This condition is caused by: Allergies. Asthma. Germs. Things that block your nose or sinuses. Growths in the nose (nasal polyps). Chemicals or irritants in the air. Fungus (rare). What increases the risk? You are more likely to develop this condition if: You have a weak body defense system (immune system). You do a lot of swimming or diving. You use nasal sprays too much. You smoke. What are the signs or symptoms? The main symptoms of this condition are pain and a feeling of pressure around the sinuses. Other symptoms include: Stuffy nose (congestion). Runny nose (drainage). Swelling and warmth in the sinuses. Headache. Toothache. A cough that may get worse at night. Mucus that collects in the throat or the back of the nose (postnasal drip). Being unable to smell and taste. Being very tired (fatigue). A fever. Sore throat. Bad breath. How is this diagnosed? This condition is diagnosed based on: Your symptoms. Your medical history. A physical exam. Tests to find out if your condition is short-term (acute) or long-term (chronic). Your doctor may: Check your nose for growths (polyps). Check your sinuses using a tool that has a light (endoscope). Check for allergies or germs. Do imaging tests, such as an MRI or CT  scan. How is this treated? Treatment for this condition depends on the cause and whether it is short-term or long-term. If caused by a virus, your symptoms should go away on their own within 10 days. You may be given medicines to relieve symptoms. They include: Medicines that shrink swollen tissue in the nose. Medicines that treat allergies (antihistamines). A spray that treats swelling of the nostrils.  Rinses that help get rid of thick mucus in your nose (nasal saline washes). If caused by bacteria, your doctor may wait to see if you will get better without treatment. You may be given antibiotic medicine if you have: A very bad infection. A weak body defense system. If caused by growths in the nose, you may need to have surgery. Follow these instructions at home: Medicines Take, use, or apply over-the-counter and  prescription medicines only as told by your doctor. These may include nasal sprays. If you were prescribed an antibiotic medicine, take it as told by your doctor. Do not stop taking the antibiotic even if you start to feel better. Hydrate and humidify  Drink enough water to keep your pee (urine) pale yellow. Use a cool mist humidifier to keep the humidity level in your home above 50%. Breathe in steam for 10-15 minutes, 3-4 times a day, or as told by your doctor. You can do this in the bathroom while a hot shower is running. Try not to spend time in cool or dry air. Rest Rest as much as you can. Sleep with your head raised (elevated). Make sure you get enough sleep each night. General instructions  Put a warm, moist washcloth on your face 3-4 times a day, or as often as told by your doctor. This will help with discomfort. Wash your hands often with soap and water. If there is no soap and water, use hand sanitizer. Do not smoke. Avoid being around people who are smoking (secondhand smoke). Keep all follow-up visits as told by your doctor. This is important. Contact a doctor  if: You have a fever. Your symptoms get worse. Your symptoms do not get better within 10 days. Get help right away if: You have a very bad headache. You cannot stop throwing up (vomiting). You have very bad pain or swelling around your face or eyes. You have trouble seeing. You feel confused. Your neck is stiff. You have trouble breathing. Summary Sinusitis is swelling of your sinuses. Sinuses are hollow spaces in the bones around your face. This condition is caused by tissues in your nose that become inflamed or swollen. This traps germs. These can lead to infection. If you were prescribed an antibiotic medicine, take it as told by your doctor. Do not stop taking it even if you start to feel better. Keep all follow-up visits as told by your doctor. This is important. This information is not intended to replace advice given to you by your health care provider. Make sure you discuss any questions you have with your health care provider. Document Revised: 04/17/2018 Document Reviewed: 04/17/2018 Elsevier Patient Education  2022 [employer].    If you have been instructed to have an in-person evaluation today at a local Urgent Care facility, please use the link below. It will take you to a list of all of our available [facility], including address, phone number and hours of operation. Please do not delay care.  [facility]  If you or a family member do not have a primary care provider, use the link below to schedule a visit and establish care. When you choose a [facility] primary care physician or advanced practice provider, you gain a long-term partner in health. Find a Primary Care Provider  Learn more about 's in-office and virtual care options: [facility]

## 2021-08-25 NOTE — Telephone Encounter (Signed)
Patient went to [facility] last Friday for a sinus infection. She was given a Antibiotic, it is not working, it also is hurting her stomach.She was hoping [facility] could help her.

## 2021-08-25 NOTE — Telephone Encounter (Signed)
I spoke with patient & explained that Yvonne [name] was not here this afternoon. I advised & walked patient through how to do a mychart video visit with a [facility] provider for more immediate care. Pt & husband both were Covid negative still just suffering with sinus symptoms & cough.

## 2021-08-25 NOTE — Progress Notes (Signed)
Virtual Visit Consent   Yvonne [name], you are scheduled for a virtual visit with a [facility] provider today.     Just as with appointments in the office, your consent must be obtained to participate.  Your consent will be active for this visit and any virtual visit you may have with one of our providers in the next 365 days.     If you have a MyChart account, a copy of this consent can be sent to you electronically.  All virtual visits are billed to your insurance company just like a traditional visit in the office.    As this is a virtual visit, video technology does not allow for your provider to perform a traditional examination.  This may limit your provider's ability to fully assess your condition.  If your provider identifies any concerns that need to be evaluated in person or the need to arrange testing (such as labs, EKG, etc.), we will make arrangements to do so.     Although advances in technology are sophisticated, we cannot ensure that it will always work on either your end or our end.  If the connection with a video visit is poor, the visit may have to be switched to a telephone visit.  With either a video or telephone visit, we are not always able to ensure that we have a secure connection.     I need to obtain your verbal consent now.   Are you willing to proceed with your visit today?    MLISSA TAMAYO has provided verbal consent on 08/25/2021 for a virtual visit (video or telephone).   Mar Daring, PA-C   Date: 08/25/2021 3:52 PM   Virtual Visit via Video Note   I, Mar Daring, connected with  Yvonne [name]  ([id number], 11/10/1966) on 08/25/21 at  3:45 PM EDT by a video-enabled telemedicine application and verified that I am speaking with the correct person using two identifiers.  Location: Patient: Virtual Visit Patient Location: Home Provider: Virtual Visit Location Provider: Home Office   I discussed the limitations of evaluation and management  by telemedicine and the availability of in person appointments. The patient expressed understanding and agreed to proceed.    History of Present Illness: Yvonne [name] is a 55 y.o. who identifies as a female who was assigned female at birth, and is being seen today for possible sinus infection.  HPI: Sinusitis This is a new problem. The current episode started 1 to 4 weeks ago. The problem has been gradually worsening since onset. There has been no fever. The pain is moderate. Associated symptoms include congestion, coughing, headaches, a hoarse voice, sinus pressure and a sore throat. Past treatments include antibiotics (tessalon perles and amoxicillin given by UC on Friday, 08/21/21). The treatment provided no relief.   Covid testing is negative.  Problems:  Patient Active Problem List   Diagnosis Date Noted   Alternating constipation and diarrhea 07/29/2021   Special screening for malignant neoplasms, colon    Polyp of colon    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   COVID-19 12/19/2020   Cervical radicular pain 07/10/2020   Cervical facet joint syndrome 07/10/2020   Lumbar spondylosis 07/10/2020   Chronic pain syndrome 07/10/2020   Atherosclerosis of aorta ([place]) 03/25/2020   Arthralgia 03/19/2020   Otitis media 01/11/2020   Restless leg 01/01/2020   Weight loss counseling, encounter for 10/28/2019   Hair loss 10/28/2019   Hyperglycemia 10/28/2019   Morbid  obesity with body mass index (BMI) of 40.0 or higher ([place]) 04/29/2019   Migraines 03/29/2019   Primary osteoarthritis of both knees 01/26/2019   Screen for colon cancer 01/19/2019   Encounter for long-term (current) use of high-risk medication 01/19/2019   Non-seasonal allergic rhinitis 01/19/2019   Rheumatoid factor positive 01/17/2019   Prediabetes 12/15/2018   Chronic pain of both knees 11/17/2018   Right wrist pain 11/17/2018   GERD (gastroesophageal reflux disease) 03/31/2018   Skin lesion 03/01/2018    Hayfever 02/08/2018   Chronic nonintractable headache 02/08/2018   Hypertension 02/08/2018   Menopausal hot flushes 02/08/2018    Allergies:  Allergies  Allergen Reactions   Dried Figs [Ficus] Anaphylaxis    Tongue tingling & felt like throat was swelling.     Flonase [Fluticasone Propionate]     Increases eye pressure    Sulfa Antibiotics Swelling   Tussionex Pennkinetic Er [Hydrocod Polst-Cpm Polst Er] Itching    Hyperactive    Medications:  Current Outpatient Medications:    amoxicillin-clavulanate (AUGMENTIN) 875-125 MG tablet, Take 1 tablet by mouth 2 (two) times daily., Disp: 14 tablet, Rfl: 0   promethazine-dextromethorphan (PROMETHAZINE-DM) 6.25-15 MG/5ML syrup, Take 5 mLs by mouth 4 (four) times daily as needed for cough., Disp: 118 mL, Rfl: 0   acetaminophen (TYLENOL) 650 MG CR tablet, Take 650 mg by mouth every 8 (eight) hours as needed for pain., Disp: , Rfl:    amLODipine (NORVASC) 5 MG tablet, Take 1 tablet (5 mg total) by mouth daily., Disp: 90 tablet, Rfl: 1   baclofen (LIORESAL) 10 MG tablet, Take 1 tablet by mouth twice daily, Disp: 30 tablet, Rfl: 0   clindamycin (CLINDAGEL) 1 % gel, Apply topically 2 (two) times daily., Disp: 30 g, Rfl: 4   DULoxetine (CYMBALTA) 30 MG capsule, TAKE 1 CAPSULE BY MOUTH ONCE DAILY START  AFTER  YOU  ARE  WEANED  OFF  PAXIL, Disp: 90 capsule, Rfl: 0   fexofenadine (ALLEGRA ALLERGY) 180 MG tablet, Take 1 tablet (180 mg total) by mouth daily., Disp: 90 tablet, Rfl: 1   gabapentin (NEURONTIN) 100 MG capsule, TAKE 2 CAPSULES BY MOUTH THREE TIMES DAILY, Disp: 180 capsule, Rfl: 0   hydroxychloroquine (PLAQUENIL) 200 MG tablet, Take 200 mg by mouth 2 (two) times daily., Disp: , Rfl:    latanoprost (XALATAN) 0.005 % ophthalmic solution, Place 1 drop into both eyes at bedtime., Disp: , Rfl:    Melatonin 5 MG CAPS, Take by mouth., Disp: , Rfl:    meloxicam (MOBIC) 7.5 MG tablet, Take 1 tablet (7.5 mg total) by mouth daily as needed for pain. With  food, Disp: 90 tablet, Rfl: 0   montelukast (SINGULAIR) 10 MG tablet, TAKE 1 TABLET BY MOUTH AT BEDTIME, Disp: 90 tablet, Rfl: 0   nortriptyline (PAMELOR) 10 MG capsule, Take 1 capsule by mouth at bedtime, Disp: 90 capsule, Rfl: 0   omeprazole (PRILOSEC) 20 MG capsule, Take 1 capsule (20 mg total) by mouth daily., Disp: 90 capsule, Rfl: 0   ondansetron (ZOFRAN ODT) 8 MG disintegrating tablet, Take 1 tablet (8 mg total) by mouth 2 (two) times daily as needed for nausea or vomiting (migraine)., Disp: 30 tablet, Rfl: 1   polyethylene glycol-electrolytes (NULYTELY) 420 g solution, 1/2 jug 2 days prior to procedure between Noon and 4pm, 1/2 jug 5pm night before procedure, 1/2 jug day of procedure 5 hours before, Disp: 8000 mL, Rfl: 0   PREDNISONE PO, Take by mouth., Disp: , Rfl:  propranolol (INDERAL) 40 MG tablet, Take 1 tablet (40 mg total) by mouth 2 (two) times daily., Disp: 60 tablet, Rfl: 0   RINVOQ 15 MG TB24, Take 1 tablet by mouth daily., Disp: , Rfl:    SUMAtriptan (IMITREX) 50 MG tablet, TAKE 1 TABLET BY MOUTH ONCE FOR 1 DOSE. MAY REPEAT IN 2 HOURS IF HEADACHE PERSISTS OR RECURS, Disp: 10 tablet, Rfl: 0   timolol (TIMOPTIC) 0.25 % ophthalmic solution, Place 1 drop into both eyes daily., Disp: , Rfl:   Observations/Objective: Patient is well-developed, well-nourished in no acute distress.  Resting comfortably  at home.  Head is normocephalic, atraumatic.  No labored breathing.  Speech is clear and coherent with logical content.  Patient is alert and oriented at baseline.    Assessment and Plan: 1. Acute bacterial sinusitis - amoxicillin-clavulanate (AUGMENTIN) 875-125 MG tablet; Take 1 tablet by mouth 2 (two) times daily.  Dispense: 14 tablet; Refill: 0 - promethazine-dextromethorphan (PROMETHAZINE-DM) 6.25-15 MG/5ML syrup; Take 5 mLs by mouth 4 (four) times daily as needed for cough.  Dispense: 118 mL; Refill: 0  - Worsening symptoms that have not responded to OTC medications.  -  Stop Amoxicillin - Will give augmentin  - Promethazine DM added for cough  - Continue allergy medications.  - Stay well hydrated and get plenty of rest.  - Call or seek in person evaluation if no symptom improvement or if symptoms worsen.  Follow Up Instructions: I discussed the assessment and treatment plan with the patient. The patient was provided an opportunity to ask questions and all were answered. The patient agreed with the plan and demonstrated an understanding of the instructions.  A copy of instructions were sent to the patient via MyChart unless otherwise noted below.    The patient was advised to call back or seek an in-person evaluation if the symptoms worsen or if the condition fails to improve as anticipated.  Time:  I spent 10 minutes with the patient via telehealth technology discussing the above problems/concerns.    Mar Daring, PA-C

## 2021-08-30 ENCOUNTER — Encounter: Payer: Self-pay | Admitting: Nurse Practitioner

## 2021-08-30 ENCOUNTER — Telehealth: Payer: Managed Care, Other (non HMO) | Admitting: Nurse Practitioner

## 2021-08-30 DIAGNOSIS — B3731 Acute candidiasis of vulva and vagina: Secondary | ICD-10-CM

## 2021-08-30 MED ORDER — FLUCONAZOLE 150 MG PO TABS: 150.0000 mg | ORAL_TABLET | Freq: Once | ORAL | 1 refills | Status: AC

## 2021-08-30 NOTE — Patient Instructions (Signed)
Yvonne [name], thank you for joining Gildardo Pounds, NP for today's virtual visit.  While this provider is not your primary care provider (PCP), if your PCP is located in our provider database this encounter information will be shared with them immediately following your visit.  Consent: (Patient) Yvonne [name] provided verbal consent for this virtual visit at the beginning of the encounter.  Current Medications:  Current Outpatient Medications:    fluconazole (DIFLUCAN) 150 MG tablet, Take 1 tablet (150 mg total) by mouth once for 1 dose., Disp: 1 tablet, Rfl: 1   acetaminophen (TYLENOL) 650 MG CR tablet, Take 650 mg by mouth every 8 (eight) hours as needed for pain., Disp: , Rfl:    amLODipine (NORVASC) 5 MG tablet, Take 1 tablet (5 mg total) by mouth daily., Disp: 90 tablet, Rfl: 1   amoxicillin-clavulanate (AUGMENTIN) 875-125 MG tablet, Take 1 tablet by mouth 2 (two) times daily., Disp: 14 tablet, Rfl: 0   baclofen (LIORESAL) 10 MG tablet, Take 1 tablet by mouth twice daily, Disp: 30 tablet, Rfl: 0   clindamycin (CLINDAGEL) 1 % gel, Apply topically 2 (two) times daily., Disp: 30 g, Rfl: 4   DULoxetine (CYMBALTA) 30 MG capsule, TAKE 1 CAPSULE BY MOUTH ONCE DAILY START  AFTER  YOU  ARE  WEANED  OFF  PAXIL, Disp: 90 capsule, Rfl: 0   fexofenadine (ALLEGRA ALLERGY) 180 MG tablet, Take 1 tablet (180 mg total) by mouth daily., Disp: 90 tablet, Rfl: 1   gabapentin (NEURONTIN) 100 MG capsule, TAKE 2 CAPSULES BY MOUTH THREE TIMES DAILY, Disp: 180 capsule, Rfl: 0   hydroxychloroquine (PLAQUENIL) 200 MG tablet, Take 200 mg by mouth 2 (two) times daily., Disp: , Rfl:    latanoprost (XALATAN) 0.005 % ophthalmic solution, Place 1 drop into both eyes at bedtime., Disp: , Rfl:    Melatonin 5 MG CAPS, Take by mouth., Disp: , Rfl:    meloxicam (MOBIC) 7.5 MG tablet, Take 1 tablet (7.5 mg total) by mouth daily as needed for pain. With food, Disp: 90 tablet, Rfl: 0   montelukast (SINGULAIR) 10 MG tablet,  TAKE 1 TABLET BY MOUTH AT BEDTIME, Disp: 90 tablet, Rfl: 0   nortriptyline (PAMELOR) 10 MG capsule, Take 1 capsule by mouth at bedtime, Disp: 90 capsule, Rfl: 0   omeprazole (PRILOSEC) 20 MG capsule, Take 1 capsule (20 mg total) by mouth daily., Disp: 90 capsule, Rfl: 0   ondansetron (ZOFRAN ODT) 8 MG disintegrating tablet, Take 1 tablet (8 mg total) by mouth 2 (two) times daily as needed for nausea or vomiting (migraine)., Disp: 30 tablet, Rfl: 1   polyethylene glycol-electrolytes (NULYTELY) 420 g solution, 1/2 jug 2 days prior to procedure between Noon and 4pm, 1/2 jug 5pm night before procedure, 1/2 jug day of procedure 5 hours before, Disp: 8000 mL, Rfl: 0   PREDNISONE PO, Take by mouth., Disp: , Rfl:    promethazine-dextromethorphan (PROMETHAZINE-DM) 6.25-15 MG/5ML syrup, Take 5 mLs by mouth 4 (four) times daily as needed for cough., Disp: 118 mL, Rfl: 0   propranolol (INDERAL) 40 MG tablet, Take 1 tablet (40 mg total) by mouth 2 (two) times daily., Disp: 60 tablet, Rfl: 0   RINVOQ 15 MG TB24, Take 1 tablet by mouth daily., Disp: , Rfl:    SUMAtriptan (IMITREX) 50 MG tablet, TAKE 1 TABLET BY MOUTH ONCE FOR 1 DOSE. MAY REPEAT IN 2 HOURS IF HEADACHE PERSISTS OR RECURS, Disp: 10 tablet, Rfl: 0   timolol (TIMOPTIC) 0.25 %  ophthalmic solution, Place 1 drop into both eyes daily., Disp: , Rfl:    Medications ordered in this encounter:  Meds ordered this encounter  Medications   fluconazole (DIFLUCAN) 150 MG tablet    Sig: Take 1 tablet (150 mg total) by mouth once for 1 dose.    Dispense:  1 tablet    Refill:  1    Order Specific Question:   Supervising Provider    Answer:   Sabra Heck, BRIAN [[id number]]     *If you need refills on other medications prior to your next appointment, please contact your pharmacy*  Follow-Up: Call back or seek an in-person evaluation if the symptoms worsen or if the condition fails to improve as anticipated.  Other Instructions Assessment and Plan: 1. Vaginal  candidiasis - fluconazole (DIFLUCAN) 150 MG tablet; Take 1 tablet (150 mg total) by mouth once for 1 dose.  Dispense: 1 tablet; Refill: 1 Cold packs to vaginal for itching and irritation     If you have been instructed to have an in-person evaluation today at a local Urgent Care facility, please use the link below. It will take you to a list of all of our available [facility], including address, phone number and hours of operation. Please do not delay care.  [facility]  If you or a family member do not have a primary care provider, use the link below to schedule a visit and establish care. When you choose a [facility] primary care physician or advanced practice provider, you gain a long-term partner in health. Find a Primary Care Provider  Learn more about [facility] in-office and virtual care options: [facility]   .

## 2021-08-30 NOTE — Progress Notes (Signed)
Virtual Visit Consent   Yvonne [name], you are scheduled for a virtual visit with a [facility] provider today.     Just as with appointments in the office, your consent must be obtained to participate.  Your consent will be active for this visit and any virtual visit you may have with one of our providers in the next 365 days.     If you have a MyChart account, a copy of this consent can be sent to you electronically.  All virtual visits are billed to your insurance company just like a traditional visit in the office.    As this is a virtual visit, video technology does not allow for your provider to perform a traditional examination.  This may limit your provider's ability to fully assess your condition.  If your provider identifies any concerns that need to be evaluated in person or the need to arrange testing (such as labs, EKG, etc.), we will make arrangements to do so.     Although advances in technology are sophisticated, we cannot ensure that it will always work on either your end or our end.  If the connection with a video visit is poor, the visit may have to be switched to a telephone visit.  With either a video or telephone visit, we are not always able to ensure that we have a secure connection.     I need to obtain your verbal consent now.   Are you willing to proceed with your visit today?    Yvonne [name] has provided verbal consent on 08/30/2021 for a virtual visit (video or telephone).   Gildardo Pounds, NP   Date: 08/30/2021 5:23 PM   Virtual Visit via Video Note   I, Gildardo Pounds, connected with  Yvonne [name]  ([id number], [birth year]) on 08/30/21 at  5:15 PM EDT by a video-enabled telemedicine application and verified that I am speaking with the correct person using two identifiers.  Location: Patient: Virtual Visit Location Patient: Home Provider: Virtual Visit Location Provider: Office/Clinic   I discussed the limitations of evaluation and management by  telemedicine and the availability of in person appointments. The patient expressed understanding and agreed to proceed.    History of Present Illness: Yvonne [name] is a 55 y.o. who identifies as a female who was assigned female at birth, and is being seen today for Vaginal yeast infection   HPI:  Vaginitis: Patient complains of an abnormal vaginal itching for the past few days after being started on Augmentin 4 days ago for a sinus infection. Vaginal symptoms include local irritation, vulvar itching, and vaginal  burning from the itching and scratching .STI Risk: Very low risk of STD exposure. Discharge described as: normal and physiologic.Other associated symptoms: none .   Problems:  Patient Active Problem List   Diagnosis Date Noted   Alternating constipation and diarrhea 07/29/2021   Special screening for malignant neoplasms, colon    Polyp of colon    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   COVID-19 12/19/2020   Cervical radicular pain 07/10/2020   Cervical facet joint syndrome 07/10/2020   Lumbar spondylosis 07/10/2020   Chronic pain syndrome 07/10/2020   Atherosclerosis of aorta ([place]) 03/25/2020   Arthralgia 03/19/2020   Otitis media 01/11/2020   Restless leg 01/01/2020   Weight loss counseling, encounter for 10/28/2019   Hair loss 10/28/2019   Hyperglycemia 10/28/2019   Morbid obesity with body mass index (BMI) of 40.0 or higher ([place])  04/29/2019   Migraines 03/29/2019   Primary osteoarthritis of both knees 01/26/2019   Screen for colon cancer 01/19/2019   Encounter for long-term (current) use of high-risk medication 01/19/2019   Non-seasonal allergic rhinitis 01/19/2019   Rheumatoid factor positive 01/17/2019   Prediabetes 12/15/2018   Chronic pain of both knees 11/17/2018   Right wrist pain 11/17/2018   GERD (gastroesophageal reflux disease) 03/31/2018   Skin lesion 03/01/2018   Hayfever 02/08/2018   Chronic nonintractable headache 02/08/2018    Hypertension 02/08/2018   Menopausal hot flushes 02/08/2018    Allergies:  Allergies  Allergen Reactions   Dried Figs [Ficus] Anaphylaxis    Tongue tingling & felt like throat was swelling.     Flonase [Fluticasone Propionate]     Increases eye pressure    Sulfa Antibiotics Swelling   Tussionex Pennkinetic Er [Hydrocod Polst-Cpm Polst Er] Itching    Hyperactive    Medications:  Current Outpatient Medications:    fluconazole (DIFLUCAN) 150 MG tablet, Take 1 tablet (150 mg total) by mouth once for 1 dose., Disp: 1 tablet, Rfl: 1   acetaminophen (TYLENOL) 650 MG CR tablet, Take 650 mg by mouth every 8 (eight) hours as needed for pain., Disp: , Rfl:    amLODipine (NORVASC) 5 MG tablet, Take 1 tablet (5 mg total) by mouth daily., Disp: 90 tablet, Rfl: 1   amoxicillin-clavulanate (AUGMENTIN) 875-125 MG tablet, Take 1 tablet by mouth 2 (two) times daily., Disp: 14 tablet, Rfl: 0   baclofen (LIORESAL) 10 MG tablet, Take 1 tablet by mouth twice daily, Disp: 30 tablet, Rfl: 0   clindamycin (CLINDAGEL) 1 % gel, Apply topically 2 (two) times daily., Disp: 30 g, Rfl: 4   DULoxetine (CYMBALTA) 30 MG capsule, TAKE 1 CAPSULE BY MOUTH ONCE DAILY START  AFTER  YOU  ARE  WEANED  OFF  PAXIL, Disp: 90 capsule, Rfl: 0   fexofenadine (ALLEGRA ALLERGY) 180 MG tablet, Take 1 tablet (180 mg total) by mouth daily., Disp: 90 tablet, Rfl: 1   gabapentin (NEURONTIN) 100 MG capsule, TAKE 2 CAPSULES BY MOUTH THREE TIMES DAILY, Disp: 180 capsule, Rfl: 0   hydroxychloroquine (PLAQUENIL) 200 MG tablet, Take 200 mg by mouth 2 (two) times daily., Disp: , Rfl:    latanoprost (XALATAN) 0.005 % ophthalmic solution, Place 1 drop into both eyes at bedtime., Disp: , Rfl:    Melatonin 5 MG CAPS, Take by mouth., Disp: , Rfl:    meloxicam (MOBIC) 7.5 MG tablet, Take 1 tablet (7.5 mg total) by mouth daily as needed for pain. With food, Disp: 90 tablet, Rfl: 0   montelukast (SINGULAIR) 10 MG tablet, TAKE 1 TABLET BY MOUTH AT BEDTIME,  Disp: 90 tablet, Rfl: 0   nortriptyline (PAMELOR) 10 MG capsule, Take 1 capsule by mouth at bedtime, Disp: 90 capsule, Rfl: 0   omeprazole (PRILOSEC) 20 MG capsule, Take 1 capsule (20 mg total) by mouth daily., Disp: 90 capsule, Rfl: 0   ondansetron (ZOFRAN ODT) 8 MG disintegrating tablet, Take 1 tablet (8 mg total) by mouth 2 (two) times daily as needed for nausea or vomiting (migraine)., Disp: 30 tablet, Rfl: 1   polyethylene glycol-electrolytes (NULYTELY) 420 g solution, 1/2 jug 2 days prior to procedure between Noon and 4pm, 1/2 jug 5pm night before procedure, 1/2 jug day of procedure 5 hours before, Disp: 8000 mL, Rfl: 0   PREDNISONE PO, Take by mouth., Disp: , Rfl:    promethazine-dextromethorphan (PROMETHAZINE-DM) 6.25-15 MG/5ML syrup, Take 5 mLs by mouth 4 (  four) times daily as needed for cough., Disp: 118 mL, Rfl: 0   propranolol (INDERAL) 40 MG tablet, Take 1 tablet (40 mg total) by mouth 2 (two) times daily., Disp: 60 tablet, Rfl: 0   RINVOQ 15 MG TB24, Take 1 tablet by mouth daily., Disp: , Rfl:    SUMAtriptan (IMITREX) 50 MG tablet, TAKE 1 TABLET BY MOUTH ONCE FOR 1 DOSE. MAY REPEAT IN 2 HOURS IF HEADACHE PERSISTS OR RECURS, Disp: 10 tablet, Rfl: 0   timolol (TIMOPTIC) 0.25 % ophthalmic solution, Place 1 drop into both eyes daily., Disp: , Rfl:   Observations/Objective: Patient is well-developed, well-nourished in no acute distress.  Resting comfortably at home.  Head is normocephalic, atraumatic.  No labored breathing.  Speech is clear and coherent with logical content.  Patient is alert and oriented at baseline.    Assessment and Plan: 1. Vaginal candidiasis - fluconazole (DIFLUCAN) 150 MG tablet; Take 1 tablet (150 mg total) by mouth once for 1 dose.  Dispense: 1 tablet; Refill: 1 Cold packs to vaginal for itching and irritation  Follow Up Instructions: I discussed the assessment and treatment plan with the patient. The patient was provided an opportunity to ask questions  and all were answered. The patient agreed with the plan and demonstrated an understanding of the instructions.  A copy of instructions were sent to the patient via MyChart unless otherwise noted below.     The patient was advised to call back or seek an in-person evaluation if the symptoms worsen or if the condition fails to improve as anticipated.  Time:  I spent 7 minutes with the patient via telehealth technology discussing the above problems/concerns.    Gildardo Pounds, NP

## 2021-09-01 ENCOUNTER — Other Ambulatory Visit: Payer: Self-pay | Admitting: Family

## 2021-09-01 DIAGNOSIS — R519 Headache, unspecified: Secondary | ICD-10-CM

## 2021-09-04 ENCOUNTER — Ambulatory Visit: Payer: Managed Care, Other (non HMO) | Admitting: Family

## 2021-09-09 ENCOUNTER — Other Ambulatory Visit: Payer: Self-pay

## 2021-09-09 ENCOUNTER — Ambulatory Visit: Payer: Managed Care, Other (non HMO) | Admitting: Family

## 2021-09-09 ENCOUNTER — Encounter: Payer: Self-pay | Admitting: Family

## 2021-09-09 VITALS — BP 122/82 | HR 71 | Temp 98.8°F | Ht 62.5 in | Wt 244.0 lb

## 2021-09-09 DIAGNOSIS — G43809 Other migraine, not intractable, without status migrainosus: Secondary | ICD-10-CM | POA: Diagnosis not present

## 2021-09-09 DIAGNOSIS — J0100 Acute maxillary sinusitis, unspecified: Secondary | ICD-10-CM | POA: Diagnosis not present

## 2021-09-09 DIAGNOSIS — R519 Headache, unspecified: Secondary | ICD-10-CM | POA: Diagnosis not present

## 2021-09-09 DIAGNOSIS — I10 Essential (primary) hypertension: Secondary | ICD-10-CM

## 2021-09-09 DIAGNOSIS — G894 Chronic pain syndrome: Secondary | ICD-10-CM

## 2021-09-09 DIAGNOSIS — J329 Chronic sinusitis, unspecified: Secondary | ICD-10-CM | POA: Insufficient documentation

## 2021-09-09 MED ORDER — AMOXICILLIN-POT CLAVULANATE 875-125 MG PO TABS: 1.0000 | ORAL_TABLET | Freq: Two times a day (BID) | ORAL | 0 refills | Status: AC

## 2021-09-09 MED ORDER — DULOXETINE HCL 30 MG PO CPEP: 60.0000 mg | ORAL_CAPSULE | Freq: Two times a day (BID) | ORAL | 2 refills | Status: DC

## 2021-09-09 NOTE — Patient Instructions (Addendum)
Start augmentin for one more week  Ensure to take probiotics while on antibiotics and also for 2 weeks after completion. This can either be by eating yogurt daily or taking a probiotic supplement over the counter such as Culturelle.It is important to re-colonize the gut with good bacteria and also to prevent any diarrheal infections associated with antibiotic use.    Let me know if sinus congestion does not fully resolve.  If it does not I would recommend a CT sinus, consideration for ENT  Increase Cymbalta to 30 mg taken twice daily

## 2021-09-09 NOTE — Progress Notes (Signed)
Subjective:    Patient ID: Yvonne [name], female    DOB: Apr 04, 1966, 55 y.o.   MRN: [id number]  CC: Yvonne [name] is a 55 y.o. female who presents today for follow up.   HPI: Treated for sinus infection with improvement 08/25/21 Continues to  have congestion lingering and would like to extend antibiotic.  Initially she was prescribed amoxicillin and then Augmentin which seemed more effective.  She had 1 prior sinus infection antibiotics earlier this year. No fever, facial swelling, ear pain, sob, facial swelling, HA, vision changes.   Compliant with claritin, singular.   Hypertension-compliant with amlodipine 5 mg. No cp,   Chronic pain-neck and low back pain remains bothersome for her.  This limits her ability to exercise which is frustrating for her.   Compliant with Cymbalta 30 mg, gabapentin 200mg  tid, mobic 7.5mg .  Migraine- Unchanged. Not increased. Worse with barometric pressure changes. HA presentation uncanged from prior. Compliant with propranolol 40mg  BID, nortriptyline 10 mg, imitrex prn.     Following with rheumatology, Dr. Posey Pronto due to arthritis.  Continue Plaquenil, Rinvoq at last visit 08/06/2021 MR cervical spine showed multilevel cervical disc and facet degeneration, spinal stenosis with severe right stenosis C3 and C4.  04/2020 MR lumbar shows severe L4-L5 facet arthrosis with trace anterolisthesis.  No stenosis. HISTORY:  Past Medical History:  Diagnosis Date   Allergy    Arthritis, rheumatoid (HCC)    Degenerative disc disease, cervical    GERD (gastroesophageal reflux disease)    Gestational diabetes    patient denies   Glaucoma    H/O bronchitis    Headache    migraine   History of ovarian cyst    Hypertension    pre-eclampsia with first child   Increased pressure in the eye, bilateral    PONV (postoperative nausea and vomiting)    Restless leg    Shingles 2021   Past Surgical History:  Procedure Laterality Date   CERVICAL POLYPECTOMY N/A  04/11/2017   Procedure: CERVICAL POLYPECTOMY;  Surgeon: Rubie Maid, MD;  Location: [facility];  Service: Gynecology;  Laterality: N/A;   COLONOSCOPY WITH PROPOFOL N/A 03/24/2021   Procedure: COLONOSCOPY WITH BIOPSY;  Surgeon: Virgel Manifold, MD;  Location: [place];  Service: Endoscopy;  Laterality: N/A;   HYSTEROSCOPY WITH D & C N/A 04/11/2017   Procedure: DILATATION AND CURETTAGE /HYSTEROSCOPY;  Surgeon: Rubie Maid, MD;  Location: [facility];  Service: Gynecology;  Laterality: N/A;   IUD REMOVAL N/A 05/05/2017   Procedure: INTRAUTERINE DEVICE (IUD) REMOVAL;  Surgeon: Rubie Maid, MD;  Location: [facility];  Service: Gynecology;  Laterality: N/A;   LAPAROSCOPIC SALPINGO OOPHERECTOMY Bilateral 05/05/2017   Procedure: LAPAROSCOPIC BILATERAL SALPINGO OOPHORECTOMY;  Surgeon: Rubie Maid, MD;  Location: [facility];  Service: Gynecology;  Laterality: Bilateral;   LAPAROSCOPY N/A 04/11/2017   Procedure: LAPAROSCOPY DIAGNOSTIC;  Surgeon: Rubie Maid, MD;  Location: [facility];  Service: Gynecology;  Laterality: N/A;   PARTIAL HYSTERECTOMY     Ovaries and Tubes   POLYPECTOMY N/A 03/24/2021   Procedure: POLYPECTOMY;  Surgeon: Virgel Manifold, MD;  Location: [place];  Service: Endoscopy;  Laterality: N/A;   TUBAL LIGATION     Family History  Problem Relation Age of Onset   Schizophrenia Mother    Bipolar disorder Mother    Alcohol abuse Father    Hypertension Father    Seizures Paternal Grandmother    Diabetes Paternal Grandmother    Hypertension Brother    Breast  cancer Brother    Hypertension Maternal Grandmother     Allergies: Dried figs [ficus], Flonase [fluticasone propionate], Sulfa antibiotics, and Tussionex pennkinetic er [employer] polst-cpm polst er] Current Outpatient Medications on File Prior to Visit  Medication Sig Dispense Refill   acetaminophen (TYLENOL) 650 MG CR tablet Take 650 mg by mouth every 8 (eight) hours as needed for pain.     amLODipine  (NORVASC) 5 MG tablet Take 1 tablet (5 mg total) by mouth daily. 90 tablet 1   baclofen (LIORESAL) 10 MG tablet Take 1 tablet by mouth twice daily 30 tablet 0   clindamycin (CLINDAGEL) 1 % gel Apply topically 2 (two) times daily. 30 g 4   fexofenadine (ALLEGRA ALLERGY) 180 MG tablet Take 1 tablet (180 mg total) by mouth daily. 90 tablet 1   gabapentin (NEURONTIN) 100 MG capsule TAKE 2 CAPSULES BY MOUTH THREE TIMES DAILY 180 capsule 0   hydroxychloroquine (PLAQUENIL) 200 MG tablet Take 200 mg by mouth 2 (two) times daily.     latanoprost (XALATAN) 0.005 % ophthalmic solution Place 1 drop into both eyes at bedtime.     Melatonin 5 MG CAPS Take by mouth.     meloxicam (MOBIC) 7.5 MG tablet Take 1 tablet (7.5 mg total) by mouth daily as needed for pain. With food 90 tablet 0   montelukast (SINGULAIR) 10 MG tablet TAKE 1 TABLET BY MOUTH AT BEDTIME 90 tablet 0   nortriptyline (PAMELOR) 10 MG capsule Take 1 capsule by mouth at bedtime 90 capsule 0   omeprazole (PRILOSEC) 20 MG capsule Take 1 capsule (20 mg total) by mouth daily. 90 capsule 0   ondansetron (ZOFRAN ODT) 8 MG disintegrating tablet Take 1 tablet (8 mg total) by mouth 2 (two) times daily as needed for nausea or vomiting (migraine). 30 tablet 1   polyethylene glycol-electrolytes (NULYTELY) 420 g solution 1/2 jug 2 days prior to procedure between Noon and 4pm, 1/2 jug 5pm night before procedure, 1/2 jug day of procedure 5 hours before 8000 mL 0   PREDNISONE PO Take by mouth.     promethazine-dextromethorphan (PROMETHAZINE-DM) 6.25-15 MG/5ML syrup Take 5 mLs by mouth 4 (four) times daily as needed for cough. 118 mL 0   propranolol (INDERAL) 40 MG tablet Take 1 tablet (40 mg total) by mouth 2 (two) times daily. 60 tablet 0   RINVOQ 15 MG TB24 Take 1 tablet by mouth daily.     SUMAtriptan (IMITREX) 50 MG tablet TAKE 1 TABLET BY MOUTH ONCE FOR 1 DOSE. MAY REPEAT IN 2 HOURS IF HEADACHE PERSISTS OR RECURS 10 tablet 0   timolol (TIMOPTIC) 0.25 %  ophthalmic solution Place 1 drop into both eyes daily.     XIIDRA 5 % SOLN Place 1 drop into both eyes.     No current facility-administered medications on file prior to visit.    Social History   Tobacco Use   Smoking status: Former    Packs/day: 1.00    Years: 6.00    Pack years: 6.00    Types: Cigarettes    Quit date: 05/29/2000    Years since quitting: 21.3   Smokeless tobacco: Never   Tobacco comments:    1/2-1 PPD  Vaping Use   Vaping Use: Never used  Substance Use Topics   Alcohol use: No    Alcohol/week: 0.0 standard drinks   Drug use: No    Review of Systems  Constitutional:  Negative for chills and fever.  HENT:  Positive for congestion.  Negative for ear pain and facial swelling.   Respiratory:  Negative for cough.   Cardiovascular:  Negative for chest pain and palpitations.  Gastrointestinal:  Negative for nausea and vomiting.  Musculoskeletal:  Positive for back pain and neck pain.     Objective:    BP 122/82 (BP Location: Left Arm, Patient Position: Sitting, Cuff Size: Large)   Pulse 71   Temp 98.8 F (37.1 C) (Oral)   Ht 5' 2.5" (1.588 m)   Wt 244 lb (110.7 kg)   LMP 05/06/2017 (Exact Date) Comment: BSO  SpO2 97%   BMI 43.92 kg/m  BP Readings from Last 3 Encounters:  09/09/21 122/82  08/10/21 116/75  07/29/21 122/70   Wt Readings from Last 3 Encounters:  09/09/21 244 lb (110.7 kg)  08/10/21 243 lb 3.2 oz (110.3 kg)  07/29/21 246 lb 9.6 oz (111.9 kg)    Physical Exam Vitals reviewed.  Constitutional:      Appearance: She is well-developed.  HENT:     Head: Normocephalic and atraumatic.     Right Ear: Hearing, tympanic membrane, ear canal and external ear normal. No decreased hearing noted. No drainage, swelling or tenderness. No middle ear effusion. No foreign body. Tympanic membrane is not erythematous or bulging.     Left Ear: Hearing, tympanic membrane, ear canal and external ear normal. No decreased hearing noted. No drainage, swelling  or tenderness.  No middle ear effusion. No foreign body. Tympanic membrane is not erythematous or bulging.     Nose: Nose normal. No rhinorrhea.     Right Sinus: No maxillary sinus tenderness or frontal sinus tenderness.     Left Sinus: No maxillary sinus tenderness or frontal sinus tenderness.     Mouth/Throat:     Pharynx: Uvula midline. No oropharyngeal exudate or posterior oropharyngeal erythema.     Tonsils: No tonsillar abscesses.  Eyes:     Conjunctiva/sclera: Conjunctivae normal.  Cardiovascular:     Rate and Rhythm: Normal rate and regular rhythm.     Pulses: Normal pulses.     Heart sounds: Normal heart sounds.  Pulmonary:     Effort: Pulmonary effort is normal.     Breath sounds: Normal breath sounds. No wheezing, rhonchi or rales.  Lymphadenopathy:     Head:     Right side of head: No submental, submandibular, tonsillar, preauricular, posterior auricular or occipital adenopathy.     Left side of head: No submental, submandibular, tonsillar, preauricular, posterior auricular or occipital adenopathy.     Cervical: No cervical adenopathy.  Skin:    General: Skin is warm and dry.  Neurological:     Mental Status: She is alert.  Psychiatric:        Speech: Speech normal.        Behavior: Behavior normal.        Thought Content: Thought content normal.       Assessment & Plan:   Problem List Items Addressed This Visit       Cardiovascular and Mediastinum   Hypertension    Chronic, stable.  Continue amlodipine 5 mg      Migraines    Chronic, stable.  Continue  propranolol 40mg  BID, nortriptyline 10 mg, imitrex prn.  We opted to increase Cymbalta to 60 mg today however we also discussed changing Cymbalta to Effexor in the future if migraines are more bothersome for her.       Relevant Medications   DULoxetine (CYMBALTA) 30 MG capsule     Respiratory  Sinusitis - Primary    Improved however not resolved.  Agreed to extend Augmentin.  Encourage probiotics.   Advised patient to continue Claritin, Singulair.  If she were to have another sinus infection or symptoms fail to completely resolve, she understands to let me know so we could pursue sinus imaging, and ENT consult      Relevant Medications   amoxicillin-clavulanate (AUGMENTIN) 875-125 MG tablet     Other   Chronic pain syndrome    Uncontrolled. known degeneration and stenosis cervical and lumbar spine from MRI in 2021.  We agreed very reasonable to increase Cymbalta to 60 mg.  She will continue gabapentin 200 mg 3 times daily, meloxicam 7.5 mg as needed.  Consider referral to pain management, Dr. Holley Raring if no improvement at follow-up . close follow-up      Relevant Medications   DULoxetine (CYMBALTA) 30 MG capsule   Other Visit Diagnoses     Nonintractable headache, unspecified chronicity pattern, unspecified headache type       Relevant Medications   DULoxetine (CYMBALTA) 30 MG capsule        I have discontinued Anadalay E. Mcmeans's amoxicillin-clavulanate. I have also changed her DULoxetine. Additionally, I am having her start on amoxicillin-clavulanate. Lastly, I am having her maintain her latanoprost, hydroxychloroquine, Melatonin, timolol, acetaminophen, amLODipine, clindamycin, PREDNISONE PO, ondansetron, meloxicam, montelukast, omeprazole, propranolol, Rinvoq, fexofenadine, polyethylene glycol-electrolytes, gabapentin, SUMAtriptan, baclofen, promethazine-dextromethorphan, nortriptyline, and Xiidra.   Meds ordered this encounter  Medications   amoxicillin-clavulanate (AUGMENTIN) 875-125 MG tablet    Sig: Take 1 tablet by mouth 2 (two) times daily for 7 days.    Dispense:  14 tablet    Refill:  0    Order Specific Question:   Supervising Provider    Answer:   Deborra Medina L [[id number]]   DULoxetine (CYMBALTA) 30 MG capsule    Sig: Take 2 capsules (60 mg total) by mouth 2 (two) times daily.    Dispense:  120 capsule    Refill:  2    Order Specific Question:   Supervising  Provider    Answer:   Crecencio Mc [[id number]]    Return precautions given.   Risks, benefits, and alternatives of the medications and treatment plan prescribed today were discussed, and patient expressed understanding.   Education regarding symptom management and diagnosis given to patient on AVS.  Continue to follow with Burnard Hawthorne, FNP for routine health maintenance.   [name] and I agreed with plan.   Mable Paris, FNP

## 2021-09-10 ENCOUNTER — Encounter: Payer: Self-pay | Admitting: Family

## 2021-09-11 ENCOUNTER — Other Ambulatory Visit: Payer: Self-pay | Admitting: Family

## 2021-09-11 ENCOUNTER — Other Ambulatory Visit: Payer: Self-pay

## 2021-09-11 ENCOUNTER — Ambulatory Visit (INDEPENDENT_AMBULATORY_CARE_PROVIDER_SITE_OTHER): Payer: Managed Care, Other (non HMO)

## 2021-09-11 DIAGNOSIS — Z23 Encounter for immunization: Secondary | ICD-10-CM | POA: Diagnosis not present

## 2021-09-11 DIAGNOSIS — G2581 Restless legs syndrome: Secondary | ICD-10-CM

## 2021-09-11 NOTE — Assessment & Plan Note (Addendum)
Uncontrolled. known degeneration and stenosis cervical and lumbar spine from MRI in 2021.  We agreed very reasonable to increase Cymbalta to 60 mg.  She will continue gabapentin 200 mg 3 times daily, meloxicam 7.5 mg as needed.  Consider referral to pain management, Dr. Holley Raring if no improvement at follow-up . close follow-up

## 2021-09-11 NOTE — Assessment & Plan Note (Signed)
Chronic, stable.  Continue  propranolol 40mg  BID, nortriptyline 10 mg, imitrex prn.  We opted to increase Cymbalta to 60 mg today however we also discussed changing Cymbalta to Effexor in the future if migraines are more bothersome for her.

## 2021-09-11 NOTE — Assessment & Plan Note (Signed)
Chronic, stable. Continue amlodipine 5mg 

## 2021-09-11 NOTE — Assessment & Plan Note (Signed)
Improved however not resolved.  Agreed to extend Augmentin.  Encourage probiotics.  Advised patient to continue Claritin, Singulair.  If she were to have another sinus infection or symptoms fail to completely resolve, she understands to let me know so we could pursue sinus imaging, and ENT consult

## 2021-09-14 ENCOUNTER — Other Ambulatory Visit: Payer: Self-pay | Admitting: Family

## 2021-09-14 ENCOUNTER — Encounter: Payer: Self-pay | Admitting: Family

## 2021-09-14 DIAGNOSIS — G2581 Restless legs syndrome: Secondary | ICD-10-CM

## 2021-09-15 ENCOUNTER — Encounter: Payer: Self-pay | Admitting: Family

## 2021-09-15 ENCOUNTER — Ambulatory Visit: Payer: Managed Care, Other (non HMO)

## 2021-09-15 ENCOUNTER — Other Ambulatory Visit: Payer: Self-pay

## 2021-09-15 MED ORDER — GABAPENTIN 100 MG PO CAPS: 200.0000 mg | ORAL_CAPSULE | Freq: Three times a day (TID) | ORAL | 0 refills | Status: DC

## 2021-09-15 MED ORDER — OMEPRAZOLE 20 MG PO CPDR: 20.0000 mg | DELAYED_RELEASE_CAPSULE | Freq: Every day | ORAL | 0 refills | Status: DC

## 2021-09-18 ENCOUNTER — Ambulatory Visit: Payer: Managed Care, Other (non HMO) | Admitting: Registered Nurse

## 2021-09-18 ENCOUNTER — Encounter: Payer: Self-pay | Admitting: Gastroenterology

## 2021-09-18 ENCOUNTER — Ambulatory Visit
Admission: RE | Admit: 2021-09-18 | Discharge: 2021-09-18 | Disposition: A | Discharge status: Home / Self Care | Payer: Managed Care, Other (non HMO) | Attending: Gastroenterology | Admitting: Gastroenterology

## 2021-09-18 ENCOUNTER — Encounter
Admission: RE | Disposition: A | Discharge status: Home / Self Care | Payer: Self-pay | Source: Home / Self Care | Attending: Gastroenterology

## 2021-09-18 ENCOUNTER — Other Ambulatory Visit: Payer: Self-pay

## 2021-09-18 DIAGNOSIS — K6389 Other specified diseases of intestine: Secondary | ICD-10-CM | POA: Insufficient documentation

## 2021-09-18 DIAGNOSIS — K219 Gastro-esophageal reflux disease without esophagitis: Secondary | ICD-10-CM | POA: Insufficient documentation

## 2021-09-18 DIAGNOSIS — K319 Disease of stomach and duodenum, unspecified: Secondary | ICD-10-CM | POA: Diagnosis not present

## 2021-09-18 DIAGNOSIS — K529 Noninfective gastroenteritis and colitis, unspecified: Secondary | ICD-10-CM | POA: Insufficient documentation

## 2021-09-18 DIAGNOSIS — K648 Other hemorrhoids: Secondary | ICD-10-CM | POA: Insufficient documentation

## 2021-09-18 DIAGNOSIS — K5730 Diverticulosis of large intestine without perforation or abscess without bleeding: Secondary | ICD-10-CM | POA: Insufficient documentation

## 2021-09-18 DIAGNOSIS — D125 Benign neoplasm of sigmoid colon: Secondary | ICD-10-CM | POA: Diagnosis not present

## 2021-09-18 DIAGNOSIS — K2289 Other specified disease of esophagus: Secondary | ICD-10-CM | POA: Diagnosis not present

## 2021-09-18 DIAGNOSIS — K639 Disease of intestine, unspecified: Secondary | ICD-10-CM

## 2021-09-18 DIAGNOSIS — K3189 Other diseases of stomach and duodenum: Secondary | ICD-10-CM | POA: Insufficient documentation

## 2021-09-18 DIAGNOSIS — Z86010 Personal history of colonic polyps: Secondary | ICD-10-CM

## 2021-09-18 DIAGNOSIS — K621 Rectal polyp: Secondary | ICD-10-CM | POA: Diagnosis not present

## 2021-09-18 DIAGNOSIS — Z87891 Personal history of nicotine dependence: Secondary | ICD-10-CM | POA: Insufficient documentation

## 2021-09-18 DIAGNOSIS — Z1211 Encounter for screening for malignant neoplasm of colon: Secondary | ICD-10-CM | POA: Insufficient documentation

## 2021-09-18 DIAGNOSIS — Z803 Family history of malignant neoplasm of breast: Secondary | ICD-10-CM | POA: Diagnosis not present

## 2021-09-18 HISTORY — PX: ESOPHAGOGASTRODUODENOSCOPY: SHX5428

## 2021-09-18 HISTORY — PX: COLONOSCOPY WITH PROPOFOL: SHX5780

## 2021-09-18 SURGERY — ESOPHAGOGASTRODUODENOSCOPY (EGD) WITH PROPOFOL
Anesthesia: General

## 2021-09-18 SURGERY — COLONOSCOPY WITH PROPOFOL
Anesthesia: General

## 2021-09-18 MED ORDER — PROPOFOL 500 MG/50ML IV EMUL
INTRAVENOUS | Status: AC
  Filled 2021-09-18: qty 50

## 2021-09-18 MED ORDER — SODIUM CHLORIDE 0.9 % IV SOLN: INTRAVENOUS | Status: DC

## 2021-09-18 MED ORDER — PROPOFOL 10 MG/ML IV BOLUS
INTRAVENOUS | Status: DC | PRN
Start: 2021-09-18 — End: 2021-09-18
  Administered 2021-09-18 (×4): 20 mg via INTRAVENOUS
  Administered 2021-09-18: 100 mg via INTRAVENOUS
  Administered 2021-09-18 (×2): 10 mg via INTRAVENOUS
  Administered 2021-09-18 (×5): 20 mg via INTRAVENOUS

## 2021-09-18 MED ORDER — PHENYLEPHRINE HCL (PRESSORS) 10 MG/ML IV SOLN
INTRAVENOUS | Status: AC
  Filled 2021-09-18: qty 1

## 2021-09-18 MED ORDER — PHENYLEPHRINE HCL (PRESSORS) 10 MG/ML IV SOLN
INTRAVENOUS | Status: DC | PRN
  Administered 2021-09-18: 200 ug via INTRAVENOUS

## 2021-09-18 MED ORDER — PHENYLEPHRINE HCL-NACL 20-0.9 MG/250ML-% IV SOLN
INTRAVENOUS | Status: AC
  Filled 2021-09-18: qty 250

## 2021-09-18 MED ORDER — PROPOFOL 500 MG/50ML IV EMUL
INTRAVENOUS | Status: DC | PRN
  Administered 2021-09-18: 150 ug/kg/min via INTRAVENOUS

## 2021-09-18 MED ORDER — LIDOCAINE HCL (CARDIAC) PF 100 MG/5ML IV SOSY
PREFILLED_SYRINGE | INTRAVENOUS | Status: DC | PRN
  Administered 2021-09-18: 40 mg via INTRAVENOUS

## 2021-09-18 MED ORDER — DEXMEDETOMIDINE (PRECEDEX) IN NS 20 MCG/5ML (4 MCG/ML) IV SYRINGE
PREFILLED_SYRINGE | INTRAVENOUS | Status: DC | PRN
  Administered 2021-09-18: 8 ug via INTRAVENOUS
  Administered 2021-09-18: 12 ug via INTRAVENOUS

## 2021-09-18 NOTE — Anesthesia Postprocedure Evaluation (Signed)
Anesthesia Post Note  Patient: Yvonne [name]  Procedure(s) Performed: COLONOSCOPY WITH PROPOFOL ESOPHAGOGASTRODUODENOSCOPY (EGD)  Patient location during evaluation: PACU Anesthesia Type: General Level of consciousness: awake, awake and alert and oriented Pain management: pain level controlled Vital Signs Assessment: post-procedure vital signs reviewed and stable Respiratory status: spontaneous breathing Cardiovascular status: stable Anesthetic complications: no   No notable events documented.   Last Vitals:  Vitals:   09/18/21 0902 09/18/21 0912  BP: (!) 87/49 127/77  Pulse: 68   Resp: 16   Temp: 36.4 C   SpO2: 98%     Last Pain:  Vitals:   09/18/21 0922  TempSrc:   PainSc: 0-No pain                 VAN STAVEREN,Carol Theys

## 2021-09-18 NOTE — Transfer of Care (Signed)
Immediate Anesthesia Transfer of Care Note  Patient: Yvonne [name]  Procedure(s) Performed: COLONOSCOPY WITH PROPOFOL ESOPHAGOGASTRODUODENOSCOPY (EGD)  Patient Location: PACU and Endoscopy Unit  Anesthesia Type:General  Level of Consciousness: drowsy  Airway & Oxygen Therapy: Patient Spontanous Breathing  Post-op Assessment: Report given to RN and Post -op Vital signs reviewed and stable  Post vital signs: Reviewed and stable  Last Vitals:  Vitals Value Taken Time  BP 87/49 09/18/21 0904  Temp    Pulse 66 09/18/21 0905  Resp 19 09/18/21 0905  SpO2 99 % 09/18/21 0905  Vitals shown include unvalidated device data.  Last Pain:  Vitals:   09/18/21 0712  TempSrc: Temporal  PainSc: 0-No pain         Complications: No notable events documented.

## 2021-09-18 NOTE — Op Note (Signed)
[facility] Patient Name: Yvonne [name] Procedure Date: 09/18/2021 7:55 AM MRN: [id number] Account #: [id number] Date of Birth: Feb 13, 1966 Admit Type: Outpatient Age: 55 Room: [facility] ENDO ROOM 2 Gender: Female Note Status: Finalized Instrument Name: Park Meo [id number] Procedure:             Colonoscopy Indications:           High risk colon cancer surveillance: Personal history                         of colonic polyps Providers:             Bergen Melle B. Bonna Gains MD, MD Referring MD:          Yvetta Coder. Arnett (Referring MD) Medicines:             Monitored Anesthesia Care Complications:         No immediate complications. Procedure:             Pre-Anesthesia Assessment:                        - ASA Grade Assessment: II - A patient with mild                         systemic disease.                        - Prior to the procedure, a History and Physical was                         performed, and patient medications, allergies and                         sensitivities were reviewed. The patient's tolerance                         of previous anesthesia was reviewed.                        - The risks and benefits of the procedure and the                         sedation options and risks were discussed with the                         patient. All questions were answered and informed                         consent was obtained.                        - Patient identification and proposed procedure were                         verified prior to the procedure by the physician, the                         nurse, the anesthesiologist, the anesthetist and the  technician. The procedure was verified in the                         procedure room.                        After obtaining informed consent, the colonoscope was                         passed under direct vision. Throughout the procedure,                         the  patient's blood pressure, pulse, and oxygen                         saturations were monitored continuously. The                         Colonoscope was introduced through the anus and                         advanced to the the terminal ileum. The colonoscopy                         was performed with ease. The patient tolerated the                         procedure well. The quality of the bowel preparation                         was good. Findings:      The perianal and digital rectal examinations were normal.      A patchy area of mildly erythematous mucosa was found at the appendiceal       orifice. Biopsies were taken with a cold forceps for histology.      A 4 mm polyp was found in the rectum. The polyp was sessile. The polyp       was removed with a cold biopsy forceps. Resection and retrieval were       complete.      Two sessile polyps were found in the sigmoid colon. The polyps were 4 to       6 mm in size. These polyps were removed with a cold snare. Resection and       retrieval were complete.      Multiple diverticula were found in the sigmoid colon.      The exam was otherwise without abnormality.      The rectum, sigmoid colon, descending colon, transverse colon, ascending       colon, cecum and ileum appeared normal. Biopsies for histology were       taken with a cold forceps from the entire colon for evaluation of       microscopic colitis.      Non-bleeding internal hemorrhoids were found during retroflexion. Impression:            - Erythematous mucosa at the appendiceal orifice.                         Biopsied.                        -  One 4 mm polyp in the rectum, removed with a cold                         biopsy forceps. Resected and retrieved.                        - Two 4 to 6 mm polyps in the sigmoid colon, removed                         with a cold snare. Resected and retrieved.                        - Diverticulosis in the sigmoid colon.                         - The examination was otherwise normal.                        - The rectum, sigmoid colon, descending colon,                         transverse colon, ascending colon, cecum and terminal                         ileum are normal. Biopsied.                        - Non-bleeding internal hemorrhoids. Recommendation:        - Discharge patient to home (with escort).                        - Advance diet as tolerated.                        - Continue present medications.                        - Await pathology results.                        - Repeat colonoscopy date to be determined after                         pending pathology results are reviewed.                        - The findings and recommendations were discussed with                         the patient.                        - The findings and recommendations were discussed with                         the patient's family.                        - Return to primary care physician as previously  scheduled.                        - High fiber diet. Procedure Code(s):     --- Professional ---                        [phone], Colonoscopy, flexible; with removal of                         tumor(s), polyp(s), or other lesion(s) by snare                         technique                        45380, 76, Colonoscopy, flexible; with biopsy, single                         or multiple Diagnosis Code(s):     --- Professional ---                        Z86.010, Personal history of colonic polyps                        K62.1, Rectal polyp                        K63.5, Polyp of colon                        K63.89, Other specified diseases of intestine CPT copyright 2019 [employer]. All rights reserved. The codes documented in this report are preliminary and upon coder review may  be revised to meet current compliance requirements.  [name], MD Margretta Sidle B. Bonna Gains MD,  MD 09/18/2021 9:04:28 AM This report has been signed electronically. Number of Addenda: 0 Note Initiated On: 09/18/2021 7:55 AM Scope Withdrawal Time: 0 hours 19 minutes 30 seconds  Total Procedure Duration: 0 hours 24 minutes 55 seconds  Estimated Blood Loss:  Estimated blood loss: none.      [facility]

## 2021-09-18 NOTE — H&P (Signed)
Yvonne Antigua, MD [address], [place], [place], [place], [address] [address], [place], [place], [place], [address] Phone: [phone]  Fax: [phone]  Primary Care Physician:  Burnard Hawthorne, FNP   Pre-Procedure History & Physical: HPI:  Yvonne [name] is a 55 y.o. female is here for a colonoscopy and EGD.   Past Medical History:  Diagnosis Date   Allergy    Arthritis, rheumatoid (HCC)    Degenerative disc disease, cervical    GERD (gastroesophageal reflux disease)    Gestational diabetes    patient denies   Glaucoma    H/O bronchitis    Headache    migraine   History of ovarian cyst    Hypertension    pre-eclampsia with first child   Increased pressure in the eye, bilateral    PONV (postoperative nausea and vomiting)    Restless leg    Shingles 2021    Past Surgical History:  Procedure Laterality Date   CERVICAL POLYPECTOMY N/A 04/11/2017   Procedure: CERVICAL POLYPECTOMY;  Surgeon: Rubie Maid, MD;  Location: [facility];  Service: Gynecology;  Laterality: N/A;   COLONOSCOPY WITH PROPOFOL N/A 03/24/2021   Procedure: COLONOSCOPY WITH BIOPSY;  Surgeon: Virgel Manifold, MD;  Location: [facility];  Service: Endoscopy;  Laterality: N/A;   HYSTEROSCOPY WITH D & C N/A 04/11/2017   Procedure: DILATATION AND CURETTAGE /HYSTEROSCOPY;  Surgeon: Rubie Maid, MD;  Location: [facility];  Service: Gynecology;  Laterality: N/A;   IUD REMOVAL N/A 05/05/2017   Procedure: INTRAUTERINE DEVICE (IUD) REMOVAL;  Surgeon: Rubie Maid, MD;  Location: [facility];  Service: Gynecology;  Laterality: N/A;   LAPAROSCOPIC SALPINGO OOPHERECTOMY Bilateral 05/05/2017   Procedure: LAPAROSCOPIC BILATERAL SALPINGO OOPHORECTOMY;  Surgeon: Rubie Maid, MD;  Location: [facility];  Service: Gynecology;  Laterality: Bilateral;   LAPAROSCOPY N/A 04/11/2017   Procedure: LAPAROSCOPY DIAGNOSTIC;  Surgeon: Rubie Maid, MD;  Location: [facility];  Service: Gynecology;  Laterality: N/A;    PARTIAL HYSTERECTOMY     Ovaries and Tubes   POLYPECTOMY N/A 03/24/2021   Procedure: POLYPECTOMY;  Surgeon: Virgel Manifold, MD;  Location: [facility];  Service: Endoscopy;  Laterality: N/A;   TUBAL LIGATION      Prior to Admission medications   Medication Sig Start Date End Date Taking? Authorizing Provider  amLODipine (NORVASC) 5 MG tablet Take 1 tablet (5 mg total) by mouth daily. 04/01/21  Yes Burnard Hawthorne, FNP  baclofen (LIORESAL) 10 MG tablet Take 1 tablet by mouth twice daily 08/12/21  Yes Arnett, Yvetta Coder, FNP  DULoxetine (CYMBALTA) 30 MG capsule Take 2 capsules (60 mg total) by mouth 2 (two) times daily. 09/09/21  Yes Burnard Hawthorne, FNP  fexofenadine [facility] ALLERGY) 180 MG tablet Take 1 tablet (180 mg total) by mouth daily. 07/30/21  Yes Arnett, Yvetta Coder, FNP  gabapentin (NEURONTIN) 100 MG capsule TAKE 2 CAPSULES BY MOUTH THREE TIMES DAILY 09/14/21  Yes Arnett, Yvetta Coder, FNP  hydroxychloroquine (PLAQUENIL) 200 MG tablet Take 200 mg by mouth 2 (two) times daily. 10/13/19  Yes [provider]  Melatonin 5 MG CAPS Take by mouth.   Yes [provider]  meloxicam (MOBIC) 7.5 MG tablet Take 1 tablet (7.5 mg total) by mouth daily as needed for pain. With food 06/09/21  Yes Arnett, Yvetta Coder, FNP  montelukast (SINGULAIR) 10 MG tablet TAKE 1 TABLET BY MOUTH AT BEDTIME 06/18/21  Yes Burnard Hawthorne, FNP  nortriptyline (PAMELOR) 10 MG capsule Take 1  capsule by mouth at bedtime 09/01/21  Yes Burnard Hawthorne, FNP  omeprazole (PRILOSEC) 20 MG capsule Take 1 capsule by mouth once daily 09/15/21  Yes Arnett, Yvetta Coder, FNP  propranolol (INDERAL) 40 MG tablet Take 1 tablet (40 mg total) by mouth 2 (two) times daily. 06/16/21  Yes Burnard Hawthorne, FNP  acetaminophen (TYLENOL) 650 MG CR tablet Take 650 mg by mouth every 8 (eight) hours as needed for pain.    [provider]  clindamycin (CLINDAGEL) 1 % gel Apply topically 2 (two) times daily.  04/21/21   Rubie Maid, MD  gabapentin (NEURONTIN) 100 MG capsule Take 2 capsules (200 mg total) by mouth 3 (three) times daily. 09/15/21   Burnard Hawthorne, FNP  latanoprost (XALATAN) 0.005 % ophthalmic solution Place 1 drop into both eyes at bedtime.    [provider]  omeprazole (PRILOSEC) 20 MG capsule Take 1 capsule (20 mg total) by mouth daily. 09/15/21   Burnard Hawthorne, FNP  ondansetron (ZOFRAN ODT) 8 MG disintegrating tablet Take 1 tablet (8 mg total) by mouth 2 (two) times daily as needed for nausea or vomiting (migraine). 06/05/21   Burnard Hawthorne, FNP  polyethylene glycol-electrolytes (NULYTELY) 420 g solution 1/2 jug 2 days prior to procedure between Noon and 4pm, 1/2 jug 5pm night before procedure, 1/2 jug day of procedure 5 hours before 08/04/21   Virgel Manifold, MD  PREDNISONE PO Take by mouth.    [provider]  promethazine-dextromethorphan (PROMETHAZINE-DM) 6.25-15 MG/5ML syrup Take 5 mLs by mouth 4 (four) times daily as needed for cough. 08/25/21   Mar Daring, PA-C  RINVOQ 15 MG TB24 Take 1 tablet by mouth daily. 07/07/21   [provider]  SUMAtriptan (IMITREX) 50 MG tablet TAKE 1 TABLET BY MOUTH ONCE FOR 1 DOSE. MAY REPEAT IN 2 HOURS IF HEADACHE PERSISTS OR RECURS 08/12/21   Burnard Hawthorne, FNP  timolol (TIMOPTIC) 0.25 % ophthalmic solution Place 1 drop into both eyes daily. 12/15/20   [provider]  XIIDRA 5 % SOLN Place 1 drop into both eyes. 09/08/21   [provider]    Allergies as of 08/04/2021 - Review Complete 07/29/2021  Allergen Reaction Noted   Dried figs [ficus] Anaphylaxis 08/05/2020   Flonase [fluticasone propionate]  03/04/2017   Sulfa antibiotics Swelling 10/08/2015   Tussionex pennkinetic er [hydrocod polst-cpm polst er] Itching 10/08/2015    Family History  Problem Relation Age of Onset   Schizophrenia Mother    Bipolar disorder Mother    Alcohol abuse Father    Hypertension Father     Seizures Paternal Grandmother    Diabetes Paternal Grandmother    Hypertension Brother    Breast cancer Brother    Hypertension Maternal Grandmother     Social History   Socioeconomic History   Marital status: Married    Spouse name: Not on file   Number of children: Not on file   Years of education: Not on file   Highest education level: Not on file  Occupational History   Occupation: Chemical engineer at [employer]  Tobacco Use   Smoking status: Former    Packs/day: 1.00    Years: 6.00    Pack years: 6.00    Types: Cigarettes    Quit date: 05/29/2000    Years since quitting: 21.3   Smokeless tobacco: Never   Tobacco comments:    1/2-1 PPD  Vaping Use   Vaping Use: Never used  Substance and Sexual Activity   Alcohol use: No    Alcohol/week: 0.0 standard drinks   Drug use: No   Sexual activity: Yes    Birth control/protection: Surgical  Other Topics Concern   Not on file  Social History Narrative   Working as Research scientist (physical sciences) at [employer] Strain: Not on file  Food Insecurity: Not on file  Transportation Needs: Not on file  Physical Activity: Not on file  Stress: Not on file  Social Connections: Not on file  Intimate Partner Violence: Not on file    Review of Systems: See HPI, otherwise negative ROS  Constitutional: General:   Alert,  Well-developed, well-nourished, pleasant and cooperative in NAD BP (!) 159/85   Pulse 65   Temp 97.6 F (36.4 C) (Temporal)   Resp 16   Ht 5' 2.5" (1.588 m)   Wt 110.7 kg   LMP 05/06/2017 (Exact Date) Comment: BSO  SpO2 100%   BMI 43.93 kg/m   Head: Normocephalic, atraumatic.   Eyes:  Sclera clear, no icterus.   Conjunctiva pink.   Mouth:  No deformity or lesions, oropharynx pink & moist.  Neck:  Supple, trachea midline  Respiratory: Normal respiratory effort  Gastrointestinal:  Soft, non-tender and non-distended without masses,  hepatosplenomegaly or hernias noted.  No guarding or rebound tenderness.     Cardiac: No clubbing or edema.  No cyanosis. Normal posterior tibial pedal pulses noted.  Lymphatic:  No significant cervical adenopathy.  Psych:  Alert and cooperative. Normal mood and affect.  Musculoskeletal:   Symmetrical without gross deformities. 5/5 Lower extremity strength bilaterally.  Skin: Warm. Intact without significant lesions or rashes. No jaundice.  Neurologic:  Face symmetrical, tongue midline, Normal sensation to touch;  grossly normal neurologically.  Psych:  Alert and oriented x3, Alert and cooperative. Normal mood and affect.  Impression/Plan: Yvonne [name] is here for a colonoscopy to be performed for history of polyps and EGD for Acid Reflux.  Risks, benefits, limitations, and alternatives regarding the procedures have been reviewed with the patient.  Questions have been answered.  All parties agreeable.   Virgel Manifold, MD  09/18/2021, 8:08 AM

## 2021-09-18 NOTE — Anesthesia Preprocedure Evaluation (Signed)
Anesthesia Evaluation  Patient identified by MRN, date of birth, ID band Patient awake    Reviewed: Allergy & Precautions, NPO status , Patient's Chart, lab work & pertinent test results  History of Anesthesia Complications (+) PONV  Airway Mallampati: II  TM Distance: >3 FB Neck ROM: full    Dental  (+) Teeth Intact   Pulmonary neg pulmonary ROS, Recent URI , former smoker,    Pulmonary exam normal  + decreased breath sounds      Cardiovascular Exercise Tolerance: Good hypertension, Pt. on medications negative cardio ROS Normal cardiovascular exam Rhythm:Regular Rate:Normal     Neuro/Psych  Headaches, negative neurological ROS  negative psych ROS   GI/Hepatic negative GI ROS, Neg liver ROS, GERD  Medicated,  Endo/Other  negative endocrine ROSdiabetes, Type 2  Renal/GU negative Renal ROS  negative genitourinary   Musculoskeletal   Abdominal (+) + obese,  Abdomen: soft.    Peds negative pediatric ROS (+)  Hematology negative hematology ROS (+)   Anesthesia Other Findings Past Medical History: No date: Allergy No date: Arthritis, rheumatoid (HCC) No date: Degenerative disc disease, cervical No date: GERD (gastroesophageal reflux disease) No date: Gestational diabetes     Comment:  patient denies No date: Glaucoma No date: H/O bronchitis No date: Headache     Comment:  migraine No date: History of ovarian cyst No date: Hypertension     Comment:  pre-eclampsia with first child No date: Increased pressure in the eye, bilateral No date: PONV (postoperative nausea and vomiting) No date: Restless leg 2021: Shingles  Past Surgical History: 04/11/2017: CERVICAL POLYPECTOMY; N/A     Comment:  Procedure: CERVICAL POLYPECTOMY;  Surgeon: Rubie Maid, MD;  Location: [facility];  Service: Gynecology;                Laterality: N/A; 03/24/2021: COLONOSCOPY WITH PROPOFOL; N/A     Comment:   Procedure: COLONOSCOPY WITH BIOPSY;  Surgeon: Virgel Manifold, MD;  Location: [facility];  Service:               Endoscopy;  Laterality: N/A; 04/11/2017: HYSTEROSCOPY WITH D & C; N/A     Comment:  Procedure: DILATATION AND CURETTAGE /HYSTEROSCOPY;                Surgeon: Rubie Maid, MD;  Location: [facility];                Service: Gynecology;  Laterality: N/A; 05/05/2017: IUD REMOVAL; N/A     Comment:  Procedure: INTRAUTERINE DEVICE (IUD) REMOVAL;  Surgeon:               Rubie Maid, MD;  Location: [facility];  Service:               Gynecology;  Laterality: N/A; 05/05/2017: LAPAROSCOPIC SALPINGO OOPHERECTOMY; Bilateral     Comment:  Procedure: LAPAROSCOPIC BILATERAL SALPINGO OOPHORECTOMY;              Surgeon: Rubie Maid, MD;  Location: [facility];                Service: Gynecology;  Laterality: Bilateral; 04/11/2017: LAPAROSCOPY; N/A     Comment:  Procedure: LAPAROSCOPY DIAGNOSTIC;  Surgeon: Rubie Maid, MD;  Location: [facility];  Service:  Gynecology;                Laterality: N/A; No date: PARTIAL HYSTERECTOMY     Comment:  Ovaries and Tubes 03/24/2021: POLYPECTOMY; N/A     Comment:  Procedure: POLYPECTOMY;  Surgeon: Virgel Manifold,               MD;  Location: [facility];  Service: Endoscopy;               Laterality: N/A; No date: TUBAL LIGATION  BMI    Body Mass Index: 43.93 kg/m      Reproductive/Obstetrics negative OB ROS                             Anesthesia Physical Anesthesia Plan  ASA: 3  Anesthesia Plan: General   Post-op Pain Management:    Induction: Intravenous  PONV Risk Score and Plan: 1 and Propofol infusion and TIVA  Airway Management Planned: Natural Airway and Nasal Cannula  Additional Equipment:   Intra-op Plan:   Post-operative Plan:   Informed Consent: I have reviewed the patients History and Physical, chart, labs and discussed the procedure including the  risks, benefits and alternatives for the proposed anesthesia with the patient or authorized representative who has indicated his/her understanding and acceptance.     Dental Advisory Given  Plan Discussed with: Anesthesiologist, CRNA and Surgeon  Anesthesia Plan Comments: (Patient consented for risks of anesthesia including but not limited to:  - adverse reactions to medications - risk of airway placement if required - damage to eyes, teeth, lips or other oral mucosa - nerve damage due to positioning  - sore throat or hoarseness - Damage to heart, brain, nerves, lungs, other parts of body or loss of life  Patient voiced understanding.)        Anesthesia Quick Evaluation

## 2021-09-18 NOTE — Op Note (Signed)
[facility] Gastroenterology Patient Name: Yvonne [name] Procedure Date: 09/18/2021 7:56 AM MRN: [id number] Account #: [id number] Date of Birth: 1966/03/16 Admit Type: Outpatient Age: 55 Room: [facility] ENDO ROOM 2 Gender: Female Note Status: Finalized Instrument Name: Altamese Cabal Endoscope [id number] Procedure:             Upper GI endoscopy Indications:           Heartburn, Follow-up of gastro-esophageal reflux                         disease Providers:             Tomasa Dobransky B. Bonna Gains MD, MD Referring MD:          Yvetta Coder. Arnett (Referring MD) Medicines:             Monitored Anesthesia Care Complications:         No immediate complications. Procedure:             Pre-Anesthesia Assessment:                        - Prior to the procedure, a History and Physical was                         performed, and patient medications, allergies and                         sensitivities were reviewed. The patient's tolerance                         of previous anesthesia was reviewed.                        - The risks and benefits of the procedure and the                         sedation options and risks were discussed with the                         patient. All questions were answered and informed                         consent was obtained.                        - Patient identification and proposed procedure were                         verified prior to the procedure by the physician, the                         nurse, the anesthesiologist, the anesthetist and the                         technician. The procedure was verified in the                         procedure room.                        - ASA Grade Assessment:  II - A patient with mild                         systemic disease.                        After obtaining informed consent, the endoscope was                         passed under direct vision. Throughout the procedure,                         the  patient's blood pressure, pulse, and oxygen                         saturations were monitored continuously. The Endoscope                         was introduced through the mouth, and advanced to the                         second part of duodenum. The upper GI endoscopy was                         accomplished with ease. The patient tolerated the                         procedure well. Findings:      Scattered islands of salmon-colored mucosa were present. The maximum       longitudinal extent of these esophageal mucosal changes was 1 cm in       length. Mucosa was biopsied with a cold forceps for histology.      A single area of ectopic gastric mucosa was found in the proximal       esophagus.      The exam of the esophagus was otherwise normal.      The exam of the stomach was otherwise normal.      Patchy mildly erythematous mucosa without bleeding was found in the       gastric body and in the gastric antrum. Biopsies were taken with a cold       forceps for histology. Biopsies were obtained in the gastric body, at       the incisura and in the gastric antrum with cold forceps for histology.      The duodenal bulb, second portion of the duodenum and examined duodenum       were normal. Biopsies for histology were taken with a cold forceps for       evaluation of celiac disease. Biopsies were done around the 5 to 6 o       clock positions to avoid biopsies near the area the ampulla, which is       usually seen around the 11 to 12 o clock positions. The ampulla was not       seen on this exam. Impression:            - Salmon-colored mucosa suspicious for short-segment                         Barrett's esophagus. Biopsied.                        -  Ectopic gastric mucosa in the proximal esophagus.                        - Erythematous mucosa in the gastric body and antrum.                         Biopsied.                        - Normal duodenal bulb, second portion of the duodenum                          and examined duodenum. Biopsied.                        - Biopsies were obtained in the gastric body, at the                         incisura and in the gastric antrum. Recommendation:        - Await pathology results.                        - Follow an antireflux regimen.                        - Discharge patient to home (with escort).                        - Advance diet as tolerated.                        - Continue present medications.                        - Patient has a contact number available for                         emergencies. The signs and symptoms of potential                         delayed complications were discussed with the patient.                         Return to normal activities tomorrow. Written                         discharge instructions were provided to the patient.                        - Discharge patient to home (with escort).                        - The findings and recommendations were discussed with                         the patient.                        - The findings and recommendations were discussed with  the patient's family. Procedure Code(s):     --- Professional ---                        [phone], Esophagogastroduodenoscopy, flexible,                         transoral; with biopsy, single or multiple Diagnosis Code(s):     --- Professional ---                        K22.8, Other specified diseases of esophagus                        Q40.2, Other specified congenital malformations of                         stomach                        K31.89, Other diseases of stomach and duodenum                        R12, Heartburn                        K21.9, Gastro-esophageal reflux disease without                         esophagitis CPT copyright 2019 American Medical Association. All rights reserved. The codes documented in this report are preliminary and upon coder review may  be revised to  meet current compliance requirements.  [name], MD Margretta Sidle B. Bonna Gains MD, MD 09/18/2021 8:28:16 AM This report has been signed electronically. Number of Addenda: 0 Note Initiated On: 09/18/2021 7:56 AM Estimated Blood Loss:  Estimated blood loss: none.      [facility]

## 2021-09-20 ENCOUNTER — Encounter: Payer: Self-pay | Admitting: Gastroenterology

## 2021-09-21 LAB — SURGICAL PATHOLOGY

## 2021-09-24 ENCOUNTER — Encounter: Payer: Self-pay | Admitting: Gastroenterology

## 2021-09-24 ENCOUNTER — Other Ambulatory Visit: Payer: Self-pay | Admitting: Family

## 2021-09-24 DIAGNOSIS — I10 Essential (primary) hypertension: Secondary | ICD-10-CM

## 2021-09-28 ENCOUNTER — Other Ambulatory Visit: Payer: Self-pay | Admitting: Family

## 2021-09-28 ENCOUNTER — Encounter: Payer: Self-pay | Admitting: Family

## 2021-09-28 DIAGNOSIS — M62838 Other muscle spasm: Secondary | ICD-10-CM

## 2021-09-29 ENCOUNTER — Other Ambulatory Visit: Payer: Self-pay

## 2021-09-29 MED ORDER — BACLOFEN 10 MG PO TABS: 10.0000 mg | ORAL_TABLET | Freq: Two times a day (BID) | ORAL | 0 refills | Status: DC

## 2021-10-01 ENCOUNTER — Telehealth: Payer: Self-pay | Admitting: Family

## 2021-10-01 ENCOUNTER — Other Ambulatory Visit: Payer: Self-pay

## 2021-10-01 DIAGNOSIS — R519 Headache, unspecified: Secondary | ICD-10-CM

## 2021-10-01 DIAGNOSIS — M62838 Other muscle spasm: Secondary | ICD-10-CM

## 2021-10-01 DIAGNOSIS — I10 Essential (primary) hypertension: Secondary | ICD-10-CM

## 2021-10-01 DIAGNOSIS — G43809 Other migraine, not intractable, without status migrainosus: Secondary | ICD-10-CM

## 2021-10-01 DIAGNOSIS — G2581 Restless legs syndrome: Secondary | ICD-10-CM

## 2021-10-01 MED ORDER — PROPRANOLOL HCL 40 MG PO TABS: 40.0000 mg | ORAL_TABLET | Freq: Two times a day (BID) | ORAL | 1 refills | Status: DC

## 2021-10-01 MED ORDER — GABAPENTIN 100 MG PO CAPS: 200.0000 mg | ORAL_CAPSULE | Freq: Three times a day (TID) | ORAL | 1 refills | Status: DC

## 2021-10-01 MED ORDER — SUMATRIPTAN SUCCINATE 50 MG PO TABS: ORAL_TABLET | ORAL | 1 refills | Status: DC

## 2021-10-01 MED ORDER — BACLOFEN 10 MG PO TABS: 10.0000 mg | ORAL_TABLET | Freq: Two times a day (BID) | ORAL | 0 refills | Status: DC

## 2021-10-01 NOTE — Telephone Encounter (Signed)
[facility]vonne [name] contacted [place] for a refill on pt's medications:   amLODipine (Norvasc) 5mg  tablet Omeprazole (Prilosec) 20mg  capsule DULoxetine (Cymbalta) 30mg  capsule   [facility] is [phone]

## 2021-10-02 ENCOUNTER — Encounter: Payer: Self-pay | Admitting: Gastroenterology

## 2021-10-02 ENCOUNTER — Other Ambulatory Visit: Payer: Self-pay

## 2021-10-02 DIAGNOSIS — I10 Essential (primary) hypertension: Secondary | ICD-10-CM

## 2021-10-02 DIAGNOSIS — G894 Chronic pain syndrome: Secondary | ICD-10-CM

## 2021-10-02 MED ORDER — AMLODIPINE BESYLATE 5 MG PO TABS: 5.0000 mg | ORAL_TABLET | Freq: Every day | ORAL | 3 refills | Status: DC

## 2021-10-02 MED ORDER — OMEPRAZOLE 20 MG PO CPDR: 20.0000 mg | DELAYED_RELEASE_CAPSULE | Freq: Every day | ORAL | 3 refills | Status: DC

## 2021-10-02 MED ORDER — DULOXETINE HCL 30 MG PO CPEP: 60.0000 mg | ORAL_CAPSULE | Freq: Two times a day (BID) | ORAL | 2 refills | Status: DC

## 2021-10-02 NOTE — Telephone Encounter (Signed)
I have sent [facility].

## 2021-10-05 ENCOUNTER — Other Ambulatory Visit: Payer: Self-pay

## 2021-10-05 DIAGNOSIS — G43809 Other migraine, not intractable, without status migrainosus: Secondary | ICD-10-CM

## 2021-10-05 DIAGNOSIS — R519 Headache, unspecified: Secondary | ICD-10-CM

## 2021-10-05 DIAGNOSIS — J3089 Other allergic rhinitis: Secondary | ICD-10-CM

## 2021-10-05 DIAGNOSIS — G8929 Other chronic pain: Secondary | ICD-10-CM

## 2021-10-05 MED ORDER — MELOXICAM 7.5 MG PO TABS: 7.5000 mg | ORAL_TABLET | Freq: Every day | ORAL | 0 refills | Status: DC | PRN

## 2021-10-05 MED ORDER — ONDANSETRON 8 MG PO TBDP: 8.0000 mg | ORAL_TABLET | Freq: Two times a day (BID) | ORAL | 1 refills | Status: DC | PRN

## 2021-10-05 MED ORDER — NORTRIPTYLINE HCL 10 MG PO CAPS: 10.0000 mg | ORAL_CAPSULE | Freq: Every day | ORAL | 0 refills | Status: DC

## 2021-10-05 MED ORDER — MONTELUKAST SODIUM 10 MG PO TABS: 10.0000 mg | ORAL_TABLET | Freq: Every day | ORAL | 0 refills | Status: DC

## 2021-10-12 ENCOUNTER — Encounter: Payer: Self-pay | Admitting: Family

## 2021-10-13 ENCOUNTER — Other Ambulatory Visit: Payer: Self-pay | Admitting: Family

## 2021-10-13 DIAGNOSIS — G894 Chronic pain syndrome: Secondary | ICD-10-CM

## 2021-10-13 MED ORDER — DULOXETINE HCL 60 MG PO CPEP: 60.0000 mg | ORAL_CAPSULE | Freq: Two times a day (BID) | ORAL | 1 refills | Status: DC

## 2021-10-13 NOTE — Progress Notes (Signed)
QTc 03/2017 396

## 2021-10-16 ENCOUNTER — Other Ambulatory Visit: Payer: Self-pay

## 2021-10-16 MED ORDER — CLINDAMYCIN PHOSPHATE 1 % EX GEL: Freq: Two times a day (BID) | CUTANEOUS | 4 refills | Status: DC

## 2021-10-20 ENCOUNTER — Ambulatory Visit (INDEPENDENT_AMBULATORY_CARE_PROVIDER_SITE_OTHER): Payer: Managed Care, Other (non HMO) | Admitting: Gastroenterology

## 2021-10-20 ENCOUNTER — Encounter: Payer: Self-pay | Admitting: Family

## 2021-10-20 ENCOUNTER — Other Ambulatory Visit: Payer: Self-pay

## 2021-10-20 VITALS — BP 123/80 | HR 55 | Temp 98.4°F | Wt 244.0 lb

## 2021-10-20 DIAGNOSIS — K529 Noninfective gastroenteritis and colitis, unspecified: Secondary | ICD-10-CM | POA: Diagnosis not present

## 2021-10-20 DIAGNOSIS — K5900 Constipation, unspecified: Secondary | ICD-10-CM

## 2021-10-20 DIAGNOSIS — R42 Dizziness and giddiness: Secondary | ICD-10-CM

## 2021-10-20 MED ORDER — FEXOFENADINE HCL 180 MG PO TABS
180.0000 mg | ORAL_TABLET | Freq: Every day | ORAL | 1 refills | Status: DC
Start: 2021-10-20 — End: 2021-10-21

## 2021-10-20 NOTE — Patient Instructions (Signed)
Start Miralax daily 

## 2021-10-20 NOTE — Progress Notes (Signed)
[name], MD [address]  [place]  [place], [place] [address]  Main: [phone]  Fax: [phone]   Primary Care Physician: Burnard Hawthorne, FNP   Chief Complaint  Patient presents with   Follow-up    Pt reports she has been eating a more bland diet with some improvement... Pt reports she is still having issues with intermittent constipation and diarrhea    HPI: Yvonne [name] is a 55 y.o. female here for follow-up.  Continues to have constipation where she will have no bowel movements for 2 days, followed by some loose bowel movements on the third or fourth day.  No blood in stool.  Is using Dulcolax as needed.  Has tried Metamucil before but it led to jellylike stools and she did not like using it.  Has used MiraLAX before but was taking it twice a day and it caused too many bowel movements so she stopped taking it.  She does describe using ibuprofen 3 pills every night for rheumatoid arthritis.  However, was recently started on Rinvoq and is hopeful to be able to discontinue her ibuprofen use after week 8 of starting Rinvoq.  Recently underwent EGD and colonoscopy with biopsies not showing microscopic colitis.  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  As per the pathology report inflammatory bowel disease is much less likely.  Biopsies from the rest of the random colon were normal.  2 small tubular adenoma polyps were seen and removed.  Gastric biopsies were negative for H. pylori.  History from previous note: Patient reports that for years, she will have intermittent episodes where she will have fecal incontinence, and then this resolves and does not recur for 1 year.  However, recently the symptoms have restarted as of the last 1 to 2 months and are worse than before, and that they have not resolved as quickly as before.  She reports not having a bowel movement for 2 to 3 days and feeling  bloated and having right-sided abdominal pain during those days.  She will eventually have a bowel movement, that is hard, followed by subsequent multiple loose bowel movements, including watery stools.  She has had episodes of fecal incontinence associated with this.  She also has chronic urinary incontinence.  No blood in stool.  No vomiting.  No weight loss.  No family history of colon cancer.  She underwent screening colonoscopy in April 2022, with polyps removed and this showed a fair prep, repeat recommended in 6 months with 2-day prep.  She describes drinking sodas during the day, and cranberry juice.  She reports 2 history of 2 vaginal deliveries in the past, with forceps used during one of them, and requiring stitches.  She does not like using Metamucil.  She tried MiraLAX 1 time but states it was too strong.  She states she may notice more symptoms of diarrhea with lactose products, and sometimes certain gluten containing products, specifically certain breads.  PCP note by Mable Paris from 07/29/2021 reviewed and reports alternating constipation and diarrhea.  They were considering irritable bowel syndrome, pelvic floor denervation contributing to her symptoms, and had ordered further work-up which is pending at this time   ROS: All ROS reviewed and negative except as per HPI   Past Medical History:  Diagnosis Date   Allergy    Arthritis, rheumatoid (HCC)    Degenerative disc disease, cervical    GERD (gastroesophageal reflux disease)  Gestational diabetes    patient denies   Glaucoma    H/O bronchitis    Headache    migraine   History of ovarian cyst    Hypertension    pre-eclampsia with first child   Increased pressure in the eye, bilateral    PONV (postoperative nausea and vomiting)    Restless leg    Shingles 2021    Past Surgical History:  Procedure Laterality Date   CERVICAL POLYPECTOMY N/A 04/11/2017   Procedure: CERVICAL POLYPECTOMY;  Surgeon: Rubie Maid, MD;  Location: [facility];  Service: Gynecology;  Laterality: N/A;   COLONOSCOPY WITH PROPOFOL N/A 03/24/2021   Procedure: COLONOSCOPY WITH BIOPSY;  Surgeon: Virgel Manifold, MD;  Location: [place];  Service: Endoscopy;  Laterality: N/A;   COLONOSCOPY WITH PROPOFOL N/A 09/18/2021   Procedure: COLONOSCOPY WITH PROPOFOL;  Surgeon: Virgel Manifold, MD;  Location: [facility];  Service: Endoscopy;  Laterality: N/A;   ESOPHAGOGASTRODUODENOSCOPY N/A 09/18/2021   Procedure: ESOPHAGOGASTRODUODENOSCOPY (EGD);  Surgeon: Virgel Manifold, MD;  Location: [facility];  Service: Endoscopy;  Laterality: N/A;   HYSTEROSCOPY WITH D & C N/A 04/11/2017   Procedure: DILATATION AND CURETTAGE /HYSTEROSCOPY;  Surgeon: Rubie Maid, MD;  Location: [facility];  Service: Gynecology;  Laterality: N/A;   IUD REMOVAL N/A 05/05/2017   Procedure: INTRAUTERINE DEVICE (IUD) REMOVAL;  Surgeon: Rubie Maid, MD;  Location: [facility];  Service: Gynecology;  Laterality: N/A;   LAPAROSCOPIC SALPINGO OOPHERECTOMY Bilateral 05/05/2017   Procedure: LAPAROSCOPIC BILATERAL SALPINGO OOPHORECTOMY;  Surgeon: Rubie Maid, MD;  Location: [facility];  Service: Gynecology;  Laterality: Bilateral;   LAPAROSCOPY N/A 04/11/2017   Procedure: LAPAROSCOPY DIAGNOSTIC;  Surgeon: Rubie Maid, MD;  Location: [facility];  Service: Gynecology;  Laterality: N/A;   PARTIAL HYSTERECTOMY     Ovaries and Tubes   POLYPECTOMY N/A 03/24/2021   Procedure: POLYPECTOMY;  Surgeon: Virgel Manifold, MD;  Location: [place];  Service: Endoscopy;  Laterality: N/A;   TUBAL LIGATION      Prior to Admission medications   Medication Sig Start Date End Date Taking? Authorizing Provider  acetaminophen (TYLENOL) 650 MG CR tablet Take 650 mg by mouth every 8 (eight) hours as needed for pain.   Yes [provider]  amLODipine (NORVASC) 5 MG tablet Take 1 tablet (5 mg total) by mouth daily. 10/02/21  Yes Arnett, Yvetta Coder,  FNP  baclofen (LIORESAL) 10 MG tablet Take 1 tablet (10 mg total) by mouth 2 (two) times daily. 10/01/21  Yes Arnett, Yvetta Coder, FNP  clindamycin (CLINDAGEL) 1 % gel Apply topically 2 (two) times daily. 10/16/21  Yes Rubie Maid, MD  DULoxetine (CYMBALTA) 60 MG capsule Take 1 capsule (60 mg total) by mouth 2 (two) times daily. 10/13/21  Yes Burnard Hawthorne, FNP  fexofenadine [facility] ALLERGY) 180 MG tablet Take 1 tablet (180 mg total) by mouth daily. 10/20/21  Yes Burnard Hawthorne, FNP  gabapentin (NEURONTIN) 100 MG capsule Take 2 capsules (200 mg total) by mouth 3 (three) times daily. 09/15/21  Yes Arnett, Yvetta Coder, FNP  gabapentin (NEURONTIN) 100 MG capsule Take 2 capsules (200 mg total) by mouth 3 (three) times daily. 10/01/21  Yes Arnett, Yvetta Coder, FNP  hydroxychloroquine (PLAQUENIL) 200 MG tablet Take 200 mg by mouth 2 (two) times daily. 10/13/19  Yes [provider]  latanoprost (XALATAN) 0.005 % ophthalmic solution Place 1 drop into both eyes at bedtime.   Yes [provider]  Melatonin 5 MG CAPS Take  by mouth.   Yes [provider]  meloxicam (MOBIC) 7.5 MG tablet Take 1 tablet (7.5 mg total) by mouth daily as needed for pain. With food 10/05/21  Yes Arnett, Yvetta Coder, FNP  montelukast (SINGULAIR) 10 MG tablet Take 1 tablet (10 mg total) by mouth at bedtime. 10/05/21  Yes Burnard Hawthorne, FNP  nortriptyline (PAMELOR) 10 MG capsule Take 1 capsule (10 mg total) by mouth at bedtime. 10/05/21  Yes Burnard Hawthorne, FNP  omeprazole (PRILOSEC) 20 MG capsule Take 1 capsule by mouth once daily 09/15/21  Yes Arnett, Yvetta Coder, FNP  omeprazole (PRILOSEC) 20 MG capsule Take 1 capsule (20 mg total) by mouth daily. 10/02/21  Yes Burnard Hawthorne, FNP  ondansetron (ZOFRAN ODT) 8 MG disintegrating tablet Take 1 tablet (8 mg total) by mouth 2 (two) times daily as needed for nausea or vomiting (migraine). 10/05/21  Yes Burnard Hawthorne, FNP  PREDNISONE PO Take by  mouth.   Yes [provider]  promethazine-dextromethorphan (PROMETHAZINE-DM) 6.25-15 MG/5ML syrup Take 5 mLs by mouth 4 (four) times daily as needed for cough. 08/25/21  Yes Mar Daring, PA-C  propranolol (INDERAL) 40 MG tablet Take 1 tablet (40 mg total) by mouth 2 (two) times daily. 10/01/21  Yes Arnett, Yvetta Coder, FNP  RINVOQ 15 MG TB24 Take 1 tablet by mouth daily. 07/07/21  Yes [provider]  SUMAtriptan (IMITREX) 50 MG tablet TAKE 1 TABLET BY MOUTH ONCE FOR 1 DOSE. MAY REPEAT IN 2 HOURS IF HEADACHE PERSISTS OR RECURS 10/01/21  Yes Arnett, Yvetta Coder, FNP  timolol (TIMOPTIC) 0.25 % ophthalmic solution Place 1 drop into both eyes daily. 12/15/20  Yes [provider]  XIIDRA 5 % SOLN Place 1 drop into both eyes. 09/08/21  Yes [provider]    Family History  Problem Relation Age of Onset   Schizophrenia Mother    Bipolar disorder Mother    Alcohol abuse Father    Hypertension Father    Seizures Paternal Grandmother    Diabetes Paternal Grandmother    Hypertension Brother    Breast cancer Brother    Hypertension Maternal Grandmother      Social History   Tobacco Use   Smoking status: Former    Packs/day: 1.00    Years: 6.00    Pack years: 6.00    Types: Cigarettes    Quit date: 05/29/2000    Years since quitting: 21.4   Smokeless tobacco: Never   Tobacco comments:    1/2-1 PPD  Vaping Use   Vaping Use: Never used  Substance Use Topics   Alcohol use: No    Alcohol/week: 0.0 standard drinks   Drug use: No    Allergies as of 10/20/2021 - Review Complete 10/20/2021  Allergen Reaction Noted   Dried figs [ficus] Anaphylaxis 08/05/2020   Flonase [fluticasone propionate]  03/04/2017   Sulfa antibiotics Swelling 10/08/2015   Tussionex pennkinetic er [hydrocod polst-cpm polst er] Itching 10/08/2015    Physical Examination:  Constitutional: General:   Alert,  Well-developed, well-nourished, pleasant and cooperative in NAD BP  123/80   Pulse (!) 55   Temp 98.4 F (36.9 C) (Oral)   Wt 244 lb (110.7 kg)   LMP 05/06/2017 (Exact Date) Comment: BSO  BMI 43.92 kg/m   Respiratory: Normal respiratory effort  Gastrointestinal:  Soft, non-tender and non-distended without masses, hepatosplenomegaly or hernias noted.  No guarding or rebound tenderness.     Cardiac: No clubbing or edema.  No cyanosis.  Normal posterior tibial pedal pulses noted.  Psych:  Alert and cooperative. Normal mood and affect.  Musculoskeletal:  Normal gait. Head normocephalic, atraumatic. Symmetrical without gross deformities. 5/5 Lower extremity strength bilaterally.  Skin: Warm. Intact without significant lesions or rashes. No jaundice.  Neck: Supple, trachea midline  Lymph: No cervical lymphadenopathy  Psych:  Alert and oriented x3, Alert and cooperative. Normal mood and affect.  Labs: CMP     Component Value Date/Time   NA 139 04/18/2020 0815   K 5.0 04/18/2020 0815   CL 101 04/18/2020 0815   CO2 22 04/18/2020 0815   GLUCOSE 105 (H) 01/14/2021 0850   BUN 17 04/18/2020 0815   CREATININE 0.67 05/14/2021 0846   CALCIUM 9.8 04/18/2020 0815   PROT 6.8 04/18/2020 0815   ALBUMIN 4.3 04/18/2020 0815   AST 14 05/14/2021 0846   ALT 21 05/14/2021 0846   ALKPHOS 109 04/18/2020 0815   BILITOT 0.5 04/18/2020 0815   GFRNONAA 101 04/18/2020 0815   GFRAA 117 04/18/2020 0815   Lab Results  Component Value Date   WBC 6.4 05/14/2021   HGB 13.9 05/14/2021   HCT 40.6 05/14/2021   MCV 87 05/14/2021   PLT 228 05/14/2021    Imaging Studies:   Assessment and Plan:   Yvonne [name] is a 55 y.o. y/o female here for follow-up and is still reporting constipation for 2 to 3 days at a time followed by some loose stools  High-fiber diet MiraLAX every other day with goal of 1-2 soft bowel movements daily or every other day.  If not at goal, patient instructed to increase dose to daily.  Patient verbalized understanding If constipation is not  relieved with this regimen, patient advised to call us and she verbalized understanding Since she has chronic constipation, I have recommended to stay away from frequent use stimulant laxatives that she is using over-the-counter  We discussed avoiding frequent NSAID use and she is hopeful that she will be able to discontinue her ibuprofen use once she is at week 8 of Rinvoq for rheumatoid arthritis.  The colitis findings in her cecum were likely due to her frequent NSAID use and is likely to improve after she discontinues frequent NSAID use.  There were no chronic changes present on biopsies and she otherwise does not have any clinical evidence of IBD  Her intermittent loose stools that occur after she has been constipated for 2 to 3 days is likely due to a component of pelvic floor dysfunction and overflow diarrhea and this was discussed with her as well.  However, if symptoms continue, again patient advised to call us  Follow-up in 3 to 4 months or earlier if needed     Dr [name]

## 2021-10-21 ENCOUNTER — Encounter: Payer: Self-pay | Admitting: Family

## 2021-10-21 ENCOUNTER — Ambulatory Visit (INDEPENDENT_AMBULATORY_CARE_PROVIDER_SITE_OTHER): Payer: Managed Care, Other (non HMO) | Admitting: Family

## 2021-10-21 ENCOUNTER — Other Ambulatory Visit: Payer: Self-pay

## 2021-10-21 ENCOUNTER — Ambulatory Visit (INDEPENDENT_AMBULATORY_CARE_PROVIDER_SITE_OTHER): Payer: Managed Care, Other (non HMO)

## 2021-10-21 VITALS — BP 122/70 | HR 63 | Temp 97.6°F | Ht 62.5 in | Wt 244.1 lb

## 2021-10-21 DIAGNOSIS — R42 Dizziness and giddiness: Secondary | ICD-10-CM | POA: Diagnosis not present

## 2021-10-21 DIAGNOSIS — M79672 Pain in left foot: Secondary | ICD-10-CM

## 2021-10-21 DIAGNOSIS — Z23 Encounter for immunization: Secondary | ICD-10-CM

## 2021-10-21 DIAGNOSIS — I10 Essential (primary) hypertension: Secondary | ICD-10-CM | POA: Diagnosis not present

## 2021-10-21 DIAGNOSIS — G894 Chronic pain syndrome: Secondary | ICD-10-CM

## 2021-10-21 MED ORDER — FEXOFENADINE HCL 180 MG PO TABS: 180.0000 mg | ORAL_TABLET | Freq: Every day | ORAL | 1 refills | Status: DC

## 2021-10-21 NOTE — Patient Instructions (Addendum)
Stop mobic 7.5mg   as discussed.  You may remain on ibuprofen 6 mg nightly as preferred.  As discussed it is not really a long-term medication as it can cause GI distress, ulcer.  Please ensure you are taking with food.  Over time I am hoping that we can decrease this medication to as needed    As discussed, let's start by scheduling Tylenol Arthritis which is a 650mg  tablet .   You may take 1-2 tablets every 8 hours ( scheduled) with maximum of 6 tablets per day.   For example , you could take two tablets in the morning ( 8am) and then two tablets again at 4pm.   Aggressive icing regimen at least 20 minutes twice per day to your left foot.  Topical Voltaren gel.  Please let me know of any new concerns  Nice to see you!

## 2021-10-21 NOTE — Assessment & Plan Note (Signed)
Etiology unclear in the absence of known injury.  Pending ankle and foot x-ray to exclude fracture,  bony lesion.  Advised more aggressive icing regimen, topical Voltaren gel. consider MRI foot, consult with podiatry if fails to resolve.

## 2021-10-21 NOTE — Progress Notes (Signed)
Subjective:    Patient ID: Yvonne [name], female    DOB: Nov 06, 1966, 55 y.o.   MRN: [id number]  CC: Yvonne [name] is a 54 y.o. female who presents today for follow up.   HPI: Complains of Left top of foot as area that is swollen and painful x 2 months, unchanged. Pain with sitting , standing, any shoe choice or barefoot. No relief with ice.  No known injury. She describes herself as 'will trip over air' however no particular moment in which she recalls hearing a sound or suffering injury  No h/o gout  No pain under the foot or numbness.     Chronic neck and back pain-Taking cymbalta 60 mg bid and feels mood and pain improved. Depression has improved. She is more diligent with diet and motivated to start to exercise.   compliant with gabapentin 200 mg 3 times daily, meloxicam 7.5 mg qam.She takes ibuprofen 600mg  every night.   RA- following with Dr Posey Pronto, compliant with rinvoq, plaquenil.   Following with gastroenterology and seen yesterday follow-up constipation, advised miralax QOD.   HTN- compliant with amlodipine 5mg . No cp, sob  Due for PVC20  HISTORY:  Past Medical History:  Diagnosis Date   Allergy    Arthritis, rheumatoid (HCC)    Degenerative disc disease, cervical    GERD (gastroesophageal reflux disease)    Gestational diabetes    patient denies   Glaucoma    H/O bronchitis    Headache    migraine   History of ovarian cyst    Hypertension    pre-eclampsia with first child   Increased pressure in the eye, bilateral    PONV (postoperative nausea and vomiting)    Restless leg    Shingles 2021   Past Surgical History:  Procedure Laterality Date   CERVICAL POLYPECTOMY N/A 04/11/2017   Procedure: CERVICAL POLYPECTOMY;  Surgeon: Rubie Maid, MD;  Location: [facility];  Service: Gynecology;  Laterality: N/A;   COLONOSCOPY WITH PROPOFOL N/A 03/24/2021   Procedure: COLONOSCOPY WITH BIOPSY;  Surgeon: Virgel Manifold, MD;  Location: [place];   Service: Endoscopy;  Laterality: N/A;   COLONOSCOPY WITH PROPOFOL N/A 09/18/2021   Procedure: COLONOSCOPY WITH PROPOFOL;  Surgeon: Virgel Manifold, MD;  Location: [facility];  Service: Endoscopy;  Laterality: N/A;   ESOPHAGOGASTRODUODENOSCOPY N/A 09/18/2021   Procedure: ESOPHAGOGASTRODUODENOSCOPY (EGD);  Surgeon: Virgel Manifold, MD;  Location[facility];  Service: Endoscopy;  Laterality: N/A;   HYSTEROSCOPY WITH D & C N/A 04/11/2017   Procedure: DILATATION AND CURETTAGE /HYSTEROSCOPY;  Surgeon: Rubie Maid, MD;  Location: [facility];  Service: Gynecology;  Laterality: N/A;   IUD REMOVAL N/A 05/05/2017   Procedure: INTRAUTERINE DEVICE (IUD) REMOVAL;  Surgeon: Rubie Maid, MD;  Location: [facility];  Service: Gynecology;  Laterality: N/A;   LAPAROSCOPIC SALPINGO OOPHERECTOMY Bilateral 05/05/2017   Procedure: LAPAROSCOPIC BILATERAL SALPINGO OOPHORECTOMY;  Surgeon: Rubie Maid, MD;  Location: [facility];  Service: Gynecology;  Laterality: Bilateral;   LAPAROSCOPY N/A 04/11/2017   Procedure: LAPAROSCOPY DIAGNOSTIC;  Surgeon: Rubie Maid, MD;  Location: [facility];  Service: Gynecology;  Laterality: N/A;   PARTIAL HYSTERECTOMY     Ovaries and Tubes   POLYPECTOMY N/A 03/24/2021   Procedure: POLYPECTOMY;  Surgeon: Virgel Manifold, MD;  Location: [place];  Service: Endoscopy;  Laterality: N/A;   TUBAL LIGATION     Family History  Problem Relation Age of Onset   Schizophrenia Mother    Bipolar disorder Mother  Alcohol abuse Father    Hypertension Father    Seizures Paternal Grandmother    Diabetes Paternal Grandmother    Hypertension Brother    Breast cancer Brother    Hypertension Maternal Grandmother     Allergies: Dried figs [ficus], Flonase [fluticasone propionate], Sulfa antibiotics, and Tussionex pennkinetic er [employer] polst-cpm polst er] Current Outpatient Medications on File Prior to Visit  Medication Sig Dispense Refill   acetaminophen (TYLENOL) 650  MG CR tablet Take 650 mg by mouth every 8 (eight) hours as needed for pain.     amLODipine (NORVASC) 5 MG tablet Take 1 tablet (5 mg total) by mouth daily. 90 tablet 3   baclofen (LIORESAL) 10 MG tablet Take 1 tablet (10 mg total) by mouth 2 (two) times daily. 90 tablet 0   clindamycin (CLINDAGEL) 1 % gel Apply topically 2 (two) times daily. 30 g 4   DULoxetine (CYMBALTA) 60 MG capsule Take 1 capsule (60 mg total) by mouth 2 (two) times daily. 180 capsule 1   gabapentin (NEURONTIN) 100 MG capsule Take 2 capsules (200 mg total) by mouth 3 (three) times daily. 180 capsule 0   hydroxychloroquine (PLAQUENIL) 200 MG tablet Take 200 mg by mouth 2 (two) times daily.     latanoprost (XALATAN) 0.005 % ophthalmic solution Place 1 drop into both eyes at bedtime.     Melatonin 5 MG CAPS Take by mouth.     montelukast (SINGULAIR) 10 MG tablet Take 1 tablet (10 mg total) by mouth at bedtime. 90 tablet 0   nortriptyline (PAMELOR) 10 MG capsule Take 1 capsule (10 mg total) by mouth at bedtime. 90 capsule 0   omeprazole (PRILOSEC) 20 MG capsule Take 1 capsule (20 mg total) by mouth daily. 90 capsule 3   ondansetron (ZOFRAN ODT) 8 MG disintegrating tablet Take 1 tablet (8 mg total) by mouth 2 (two) times daily as needed for nausea or vomiting (migraine). 30 tablet 1   propranolol (INDERAL) 40 MG tablet Take 1 tablet (40 mg total) by mouth 2 (two) times daily. 180 tablet 1   RINVOQ 15 MG TB24 Take 1 tablet by mouth daily.     SUMAtriptan (IMITREX) 50 MG tablet TAKE 1 TABLET BY MOUTH ONCE FOR 1 DOSE. MAY REPEAT IN 2 HOURS IF HEADACHE PERSISTS OR RECURS 30 tablet 1   timolol (TIMOPTIC) 0.25 % ophthalmic solution Place 1 drop into both eyes daily.     XIIDRA 5 % SOLN Place 1 drop into both eyes.     No current facility-administered medications on file prior to visit.    Social History   Tobacco Use   Smoking status: Former    Packs/day: 1.00    Years: 6.00    Pack years: 6.00    Types: Cigarettes    Quit  date: 05/29/2000    Years since quitting: 21.4   Smokeless tobacco: Never   Tobacco comments:    1/2-1 PPD  Vaping Use   Vaping Use: Never used  Substance Use Topics   Alcohol use: No    Alcohol/week: 0.0 standard drinks   Drug use: No    Review of Systems  Constitutional:  Negative for chills and fever.  Respiratory:  Negative for cough.   Cardiovascular:  Negative for chest pain and palpitations.  Gastrointestinal:  Negative for nausea and vomiting.  Musculoskeletal:  Positive for arthralgias, back pain, joint swelling and neck pain.  Neurological:  Negative for numbness.     Objective:    BP 122/70  Pulse 63   Temp 97.6 F (36.4 C) (Skin)   Ht 5' 2.5" (1.588 m)   Wt 244 lb 1.9 oz (110.7 kg)   LMP 05/06/2017 (Exact Date) Comment: BSO  SpO2 98%   BMI 43.94 kg/m  BP Readings from Last 3 Encounters:  10/21/21 122/70  10/20/21 123/80  09/18/21 127/77   Wt Readings from Last 3 Encounters:  10/21/21 244 lb 1.9 oz (110.7 kg)  10/20/21 244 lb (110.7 kg)  09/18/21 244 lb 0.8 oz (110.7 kg)    Physical Exam Vitals reviewed.  Constitutional:      Appearance: She is well-developed.  Eyes:     Conjunctiva/sclera: Conjunctivae normal.  Cardiovascular:     Rate and Rhythm: Normal rate and regular rhythm.     Pulses: Normal pulses.     Heart sounds: Normal heart sounds.  Pulmonary:     Effort: Pulmonary effort is normal.     Breath sounds: Normal breath sounds. No wheezing, rhonchi or rales.  Musculoskeletal:     Right foot: Normal range of motion. No swelling or bony tenderness.     Left foot: Normal range of motion. Swelling and bony tenderness present.       Legs:     Comments: Circular focal area of tenderness, nonpitting swelling.No erythema, increased heat, red streaks.  Area is nonfluctuant.  Skin is intact  Skin:    General: Skin is warm and dry.  Neurological:     Mental Status: She is alert.  Psychiatric:        Speech: Speech normal.        Behavior:  Behavior normal.        Thought Content: Thought content normal.       Assessment & Plan:   Problem List Items Addressed This Visit       Cardiovascular and Mediastinum   Hypertension    Chronic, stable. Continue amlodipine 5mg         Other   Chronic pain syndrome    Improved. Continue cymbalta 60mg . Advised to stop mobic 7.5mg  as she is also taking ibuprofen 600mg  qhs. Counseled on risk of renal injury, PUD, GIB. Advised to maximize Tylenol arthritis 650 mg 2 tablets twice per day, scheduled.  May consider tramadol in the future to limit NSAIDs.  Close follow-up      Left foot pain - Primary    Etiology unclear in the absence of known injury.  Pending ankle and foot x-ray to exclude fracture,  bony lesion.  Advised more aggressive icing regimen, topical Voltaren gel. consider MRI foot, consult with podiatry if fails to resolve.      Relevant Orders   DG Foot Complete Left   DG Ankle Complete Left   Other Visit Diagnoses     Vertigo       Relevant Medications   fexofenadine (ALLEGRA ALLERGY) 180 MG tablet   Need for pneumococcal vaccine       Relevant Orders   Pneumococcal conjugate vaccine 20-valent (Prevnar 20) (Completed)        I have discontinued Alexxia E. Hauschild's PREDNISONE PO, promethazine-dextromethorphan, and meloxicam. I am also having her maintain her latanoprost, hydroxychloroquine, Melatonin, timolol, acetaminophen, Rinvoq, Xiidra, gabapentin, baclofen, propranolol, SUMAtriptan, amLODipine, omeprazole, ondansetron, nortriptyline, montelukast, DULoxetine, clindamycin, and fexofenadine.   Meds ordered this encounter  Medications   fexofenadine (ALLEGRA ALLERGY) 180 MG tablet    Sig: Take 1 tablet (180 mg total) by mouth daily.    Dispense:  90 tablet    Refill:  1    Return precautions given.   Risks, benefits, and alternatives of the medications and treatment plan prescribed today were discussed, and patient expressed understanding.   Education  regarding symptom management and diagnosis given to patient on AVS.  Continue to follow with Burnard Hawthorne, FNP for routine health maintenance.   [name] and I agreed with plan.   Mable Paris, FNP

## 2021-10-21 NOTE — Assessment & Plan Note (Signed)
Chronic, stable. Continue amlodipine 5mg 

## 2021-10-21 NOTE — Assessment & Plan Note (Signed)
Improved. Continue cymbalta 60mg . Advised to stop mobic 7.5mg  as she is also taking ibuprofen 600mg  qhs. Counseled on risk of renal injury, PUD, GIB. Advised to maximize Tylenol arthritis 650 mg 2 tablets twice per day, scheduled.  May consider tramadol in the future to limit NSAIDs.  Close follow-up

## 2021-10-27 ENCOUNTER — Ambulatory Visit: Payer: Managed Care, Other (non HMO) | Admitting: Gastroenterology

## 2021-10-29 DIAGNOSIS — J189 Pneumonia, unspecified organism: Secondary | ICD-10-CM

## 2021-10-29 HISTORY — DX: Pneumonia, unspecified organism: J18.9

## 2021-11-09 ENCOUNTER — Encounter: Payer: Self-pay | Admitting: Family

## 2021-11-10 ENCOUNTER — Encounter: Payer: Self-pay | Admitting: Family

## 2021-11-10 ENCOUNTER — Emergency Department: Payer: Managed Care, Other (non HMO)

## 2021-11-10 ENCOUNTER — Telehealth (INDEPENDENT_AMBULATORY_CARE_PROVIDER_SITE_OTHER): Payer: Managed Care, Other (non HMO) | Admitting: Family

## 2021-11-10 ENCOUNTER — Other Ambulatory Visit: Payer: Self-pay

## 2021-11-10 ENCOUNTER — Encounter: Payer: Self-pay | Admitting: Radiology

## 2021-11-10 ENCOUNTER — Emergency Department
Admission: EM | Admit: 2021-11-10 | Discharge: 2021-11-10 | Disposition: A | Discharge status: Home / Self Care | Payer: Managed Care, Other (non HMO) | Attending: Emergency Medicine | Admitting: Emergency Medicine

## 2021-11-10 DIAGNOSIS — J111 Influenza due to unidentified influenza virus with other respiratory manifestations: Secondary | ICD-10-CM

## 2021-11-10 DIAGNOSIS — J40 Bronchitis, not specified as acute or chronic: Secondary | ICD-10-CM | POA: Diagnosis not present

## 2021-11-10 DIAGNOSIS — Z20822 Contact with and (suspected) exposure to covid-19: Secondary | ICD-10-CM | POA: Diagnosis not present

## 2021-11-10 DIAGNOSIS — Z8616 Personal history of COVID-19: Secondary | ICD-10-CM | POA: Diagnosis not present

## 2021-11-10 DIAGNOSIS — Z79899 Other long term (current) drug therapy: Secondary | ICD-10-CM | POA: Diagnosis not present

## 2021-11-10 DIAGNOSIS — I10 Essential (primary) hypertension: Secondary | ICD-10-CM | POA: Diagnosis not present

## 2021-11-10 DIAGNOSIS — J101 Influenza due to other identified influenza virus with other respiratory manifestations: Secondary | ICD-10-CM | POA: Diagnosis not present

## 2021-11-10 DIAGNOSIS — Z87891 Personal history of nicotine dependence: Secondary | ICD-10-CM | POA: Insufficient documentation

## 2021-11-10 DIAGNOSIS — R0789 Other chest pain: Secondary | ICD-10-CM | POA: Diagnosis present

## 2021-11-10 LAB — CBC
HCT: 36.7 % (ref 36.0–46.0)
Hemoglobin: 12.2 g/dL (ref 12.0–15.0)
MCH: 30.7 pg (ref 26.0–34.0)
MCHC: 33.2 g/dL (ref 30.0–36.0)
MCV: 92.4 fL (ref 80.0–100.0)
Platelets: 124 10*3/uL — ABNORMAL LOW (ref 150–400)
RBC: 3.97 MIL/uL (ref 3.87–5.11)
RDW: 13.2 % (ref 11.5–15.5)
WBC: 2.6 10*3/uL — ABNORMAL LOW (ref 4.0–10.5)
nRBC: 0 % (ref 0.0–0.2)

## 2021-11-10 LAB — BASIC METABOLIC PANEL
Anion gap: 5 (ref 5–15)
BUN: 18 mg/dL (ref 6–20)
CO2: 29 mmol/L (ref 22–32)
Calcium: 8.8 mg/dL — ABNORMAL LOW (ref 8.9–10.3)
Chloride: 104 mmol/L (ref 98–111)
Creatinine, Ser: 0.65 mg/dL (ref 0.44–1.00)
GFR, Estimated: >60 mL/min (ref >60)
Glucose, Bld: 114 mg/dL — ABNORMAL HIGH (ref 70–99)
Potassium: 4.1 mmol/L (ref 3.5–5.1)
Sodium: 138 mmol/L (ref 135–145)

## 2021-11-10 LAB — RESP PANEL BY RT-PCR (FLU A&B, COVID) ARPGX2
Influenza A by PCR: POSITIVE — AB
Influenza B by PCR: NEGATIVE
SARS Coronavirus 2 by RT PCR: NEGATIVE

## 2021-11-10 LAB — D-DIMER, QUANTITATIVE: D-Dimer, Quant: 1.04 ug/mL-FEU — ABNORMAL HIGH (ref 0.00–0.50)

## 2021-11-10 MED ORDER — BENZONATATE 100 MG PO CAPS: 100.0000 mg | ORAL_CAPSULE | Freq: Three times a day (TID) | ORAL | 0 refills | Status: DC | PRN

## 2021-11-10 MED ORDER — PREDNISONE 50 MG PO TABS: 50.0000 mg | ORAL_TABLET | Freq: Every day | ORAL | 0 refills | Status: DC

## 2021-11-10 MED ORDER — IBUPROFEN 600 MG PO TABS
600.0000 mg | ORAL_TABLET | Freq: Once | ORAL | Status: AC
  Administered 2021-11-10: 600 mg via ORAL
  Filled 2021-11-10: qty 1

## 2021-11-10 MED ORDER — ONDANSETRON 4 MG PO TBDP: 4.0000 mg | ORAL_TABLET | Freq: Three times a day (TID) | ORAL | 0 refills | Status: DC | PRN

## 2021-11-10 MED ORDER — IOHEXOL 350 MG/ML SOLN
75.0000 mL | Freq: Once | INTRAVENOUS | Status: AC | PRN
  Administered 2021-11-10: 75 mL via INTRAVENOUS
  Filled 2021-11-10: qty 75

## 2021-11-10 MED ORDER — ACETAMINOPHEN 325 MG PO TABS
650.0000 mg | ORAL_TABLET | Freq: Once | ORAL | Status: AC
  Administered 2021-11-10: 650 mg via ORAL
  Filled 2021-11-10: qty 2

## 2021-11-10 NOTE — ED Triage Notes (Signed)
Pt brought over by [facility] they state that she has been exposed to COVID and is coughing up blood, denies any fevers

## 2021-11-10 NOTE — Assessment & Plan Note (Signed)
Over the telephone , audible rattling sound.  Patient not gasping for air as I can determine over the phone. No vitals obtained.  She does report chest pain with coughing,  a rattle in her chest and also large size bright red blood in her sputum.  I advised I not feel comfortable assessing her over the telephone and that she needed in person visit to evaluate for bleeding, further testing including flu, repeat COVID test due to exposure and likely chest x-ray, EKG. I advised patient of all this and she understood my concern.  She states that she would prefer to go to the health department as she has free care there and see if they are able to perform the above tests and see her in person today.  Otherwise, advise if they are unable to thoroughly evaluate her, I would advise her to clinic urgent care.  She verbalized understanding of all

## 2021-11-10 NOTE — Progress Notes (Signed)
Verbal consent for services obtained from patient prior to services given to TELEPHONE visit:   Location of call:  provider at work patient at home  Names of all persons present for services: Mable Paris, NP and patient Chief complaint:   Complains of cough, bodyaches x 3 days, worsening.   Endorses rattle in chest,ear ache, hoarseness, nasal congestion, chills, tactile warmth.     She also endorses chest pain which associated with cough, seems to resolve after 'mucous comes up'.   She was taking promethazine DM with improvement.  She reports BRB ' aglob' the size of fifty cent piece happening multiple times with cough. Mouth feels very dry.   Negative for covid PCR yesterday.Son who lives with her tested positive.  She wasn't tested for flu.   Former smoker. No known h/o lung disease.  A/P/next steps:  Problem List Items Addressed This Visit       Respiratory   Bronchitis    Over the telephone , audible rattling sound.  Patient not gasping for air as I can determine over the phone. No vitals obtained.  She does report chest pain with coughing,  a rattle in her chest and also large size bright red blood in her sputum.  I advised I not feel comfortable assessing her over the telephone and that she needed in person visit to evaluate for bleeding, further testing including flu, repeat COVID test due to exposure and likely chest x-ray, EKG. I advised patient of all this and she understood my concern.  She states that she would prefer to go to the health department as she has free care there and see if they are able to perform the above tests and see her in person today.  Otherwise, advise if they are unable to thoroughly evaluate her, I would advise her to clinic urgent care.  She verbalized understanding of all        I spent 15 min  discussing plan of care over the phone.

## 2021-11-10 NOTE — ED Provider Notes (Signed)
[facility] Emergency Department Provider Note  ____________________________________________  Time seen: Approximately 3:45 PM  I have reviewed the triage vital signs and the nursing notes.   HISTORY  Chief Complaint Covid Exposure and Hemoptysis    HPI Nilah Belcourt Crosland is a 55 y.o. female who presents emergency department complaining of headache, nasal congestion, sore throat, coughing, chest tightness, body aches.  Patient son has similar symptoms, tested positive for COVID.  Patient states that the symptoms began rather rapidly 2 days ago.  They have persisted and worsened.  This morning she became concerned as she was coughing up small amounts of blood in her sputum.  There is no emesis, diarrhea or constipation.  She does have a history of GERD, migraine headaches, hypertension, rheumatoid arthritis.  No cardiac history.       Past Medical History:  Diagnosis Date   Allergy    Arthritis, rheumatoid (HCC)    Degenerative disc disease, cervical    GERD (gastroesophageal reflux disease)    Gestational diabetes    patient denies   Glaucoma    H/O bronchitis    Headache    migraine   History of ovarian cyst    Hypertension    pre-eclampsia with first child   Increased pressure in the eye, bilateral    PONV (postoperative nausea and vomiting)    Restless leg    Shingles 2021    Patient Active Problem List   Diagnosis Date Noted   Bronchitis 11/10/2021   Left foot pain 10/21/2021   Columnar epithelial-lined lower esophagus    Gastric erythema    History of colon polyps    Rectal polyp    Erythema of colon    Sinusitis 09/09/2021   Alternating constipation and diarrhea 07/29/2021   Special screening for malignant neoplasms, colon    Polyp of colon    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   COVID-19 12/19/2020   Cervical radicular pain 07/10/2020   Cervical facet joint syndrome 07/10/2020   Lumbar spondylosis 07/10/2020    Chronic pain syndrome 07/10/2020   Atherosclerosis of aorta ([place]) 03/25/2020   Arthralgia 03/19/2020   Otitis media 01/11/2020   Restless leg 01/01/2020   Weight loss counseling, encounter for 10/28/2019   Hair loss 10/28/2019   Hyperglycemia 10/28/2019   Morbid obesity with body mass index (BMI) of 40.0 or higher ([place]) 04/29/2019   Migraines 03/29/2019   Primary osteoarthritis of both knees 01/26/2019   Screen for colon cancer 01/19/2019   Encounter for long-term (current) use of high-risk medication 01/19/2019   Non-seasonal allergic rhinitis 01/19/2019   Rheumatoid factor positive 01/17/2019   Prediabetes 12/15/2018   Chronic pain of both knees 11/17/2018   Right wrist pain 11/17/2018   GERD (gastroesophageal reflux disease) 03/31/2018   Skin lesion 03/01/2018   Hayfever 02/08/2018   Chronic nonintractable headache 02/08/2018   Hypertension 02/08/2018   Menopausal hot flushes 02/08/2018    Past Surgical History:  Procedure Laterality Date   CERVICAL POLYPECTOMY N/A 04/11/2017   Procedure: CERVICAL POLYPECTOMY;  Surgeon: Rubie Maid, MD;  Location: [facility];  Service: Gynecology;  Laterality: N/A;   COLONOSCOPY WITH PROPOFOL N/A 03/24/2021   Procedure: COLONOSCOPY WITH BIOPSY;  Surgeon: Virgel Manifold, MD;  Location: [place];  Service: Endoscopy;  Laterality: N/A;   COLONOSCOPY WITH PROPOFOL N/A 09/18/2021   Procedure: COLONOSCOPY WITH PROPOFOL;  Surgeon: Virgel Manifold, MD;  Location: [facility];  Service: Endoscopy;  Laterality: N/A;   ESOPHAGOGASTRODUODENOSCOPY N/A  09/18/2021   Procedure: ESOPHAGOGASTRODUODENOSCOPY (EGD);  Surgeon: Virgel Manifold, MD;  Location[facility]COPY;  Service: Endoscopy;  Laterality: N/A;   HYSTEROSCOPY WITH D & C N/A 04/11/2017   Procedure: DILATATION AND CURETTAGE /HYSTEROSCOPY;  Surgeon: Rubie Maid, MD;  Location: [facility];  Service: Gynecology;  Laterality: N/A;   IUD REMOVAL N/A 05/05/2017   Procedure:  INTRAUTERINE DEVICE (IUD) REMOVAL;  Surgeon: Rubie Maid, MD;  Location: [facility];  Service: Gynecology;  Laterality: N/A;   LAPAROSCOPIC SALPINGO OOPHERECTOMY Bilateral 05/05/2017   Procedure: LAPAROSCOPIC BILATERAL SALPINGO OOPHORECTOMY;  Surgeon: Rubie Maid, MD;  Location: [facility];  Service: Gynecology;  Laterality: Bilateral;   LAPAROSCOPY N/A 04/11/2017   Procedure: LAPAROSCOPY DIAGNOSTIC;  Surgeon: Rubie Maid, MD;  Location: [facility];  Service: Gynecology;  Laterality: N/A;   PARTIAL HYSTERECTOMY     Ovaries and Tubes   POLYPECTOMY N/A 03/24/2021   Procedure: POLYPECTOMY;  Surgeon: Virgel Manifold, MD;  Location: [place];  Service: Endoscopy;  Laterality: N/A;   TUBAL LIGATION      Prior to Admission medications   Medication Sig Start Date End Date Taking? Authorizing Provider  benzonatate (TESSALON PERLES) 100 MG capsule Take 1 capsule (100 mg total) by mouth 3 (three) times daily as needed for cough. 11/10/21 11/10/22 Yes Danil Wedge, Charline Bills, PA-C  ondansetron (ZOFRAN-ODT) 4 MG disintegrating tablet Take 1 tablet (4 mg total) by mouth every 8 (eight) hours as needed for nausea or vomiting. 11/10/21  Yes Kyna Blahnik, Charline Bills, PA-C  predniSONE (DELTASONE) 50 MG tablet Take 1 tablet (50 mg total) by mouth daily with breakfast. 11/10/21  Yes Curtistine Pettitt, Charline Bills, PA-C  acetaminophen (TYLENOL) 650 MG CR tablet Take 650 mg by mouth every 8 (eight) hours as needed for pain.    [provider]  amLODipine (NORVASC) 5 MG tablet Take 1 tablet (5 mg total) by mouth daily. 10/02/21   Burnard Hawthorne, FNP  baclofen (LIORESAL) 10 MG tablet Take 1 tablet (10 mg total) by mouth 2 (two) times daily. 10/01/21   Burnard Hawthorne, FNP  clindamycin (CLINDAGEL) 1 % gel Apply topically 2 (two) times daily. 10/16/21   Rubie Maid, MD  DULoxetine (CYMBALTA) 60 MG capsule Take 1 capsule (60 mg total) by mouth 2 (two) times daily. 10/13/21   Burnard Hawthorne, FNP   fexofenadine (ALLEGRA ALLERGY) 180 MG tablet Take 1 tablet (180 mg total) by mouth daily. 10/21/21   Burnard Hawthorne, FNP  gabapentin (NEURONTIN) 100 MG capsule Take 2 capsules (200 mg total) by mouth 3 (three) times daily. 09/15/21   Burnard Hawthorne, FNP  hydroxychloroquine (PLAQUENIL) 200 MG tablet Take 200 mg by mouth 2 (two) times daily. 10/13/19   [provider]  latanoprost (XALATAN) 0.005 % ophthalmic solution Place 1 drop into both eyes at bedtime.    [provider]  Melatonin 5 MG CAPS Take by mouth.    [provider]  montelukast (SINGULAIR) 10 MG tablet Take 1 tablet (10 mg total) by mouth at bedtime. 10/05/21   Burnard Hawthorne, FNP  nortriptyline (PAMELOR) 10 MG capsule Take 1 capsule (10 mg total) by mouth at bedtime. 10/05/21   Burnard Hawthorne, FNP  omeprazole (PRILOSEC) 20 MG capsule Take 1 capsule (20 mg total) by mouth daily. 10/02/21   Burnard Hawthorne, FNP  ondansetron (ZOFRAN ODT) 8 MG disintegrating tablet Take 1 tablet (8 mg total) by mouth 2 (two) times daily as needed for nausea or vomiting (migraine). 10/05/21  Burnard Hawthorne, FNP  propranolol (INDERAL) 40 MG tablet Take 1 tablet (40 mg total) by mouth 2 (two) times daily. 10/01/21   Burnard Hawthorne, FNP  RINVOQ 15 MG TB24 Take 1 tablet by mouth daily. 07/07/21   [provider]  SUMAtriptan (IMITREX) 50 MG tablet TAKE 1 TABLET BY MOUTH ONCE FOR 1 DOSE. MAY REPEAT IN 2 HOURS IF HEADACHE PERSISTS OR RECURS 10/01/21   Burnard Hawthorne, FNP  timolol (TIMOPTIC) 0.25 % ophthalmic solution Place 1 drop into both eyes daily. 12/15/20   [provider]  XIIDRA 5 % SOLN Place 1 drop into both eyes. 09/08/21   [provider]    Allergies Dried figs [ficus], Flonase [fluticasone propionate], Sulfa antibiotics, and Tussionex pennkinetic er [employer] polst-cpm polst er]  Family History  Problem Relation Age of Onset   Schizophrenia Mother    Bipolar disorder  Mother    Alcohol abuse Father    Hypertension Father    Seizures Paternal Grandmother    Diabetes Paternal Grandmother    Hypertension Brother    Breast cancer Brother    Hypertension Maternal Grandmother     Social History Social History   Tobacco Use   Smoking status: Former    Packs/day: 1.00    Years: 6.00    Pack years: 6.00    Types: Cigarettes    Quit date: 05/29/2000    Years since quitting: 21.4   Smokeless tobacco: Never   Tobacco comments:    1/2-1 PPD  Vaping Use   Vaping Use: Never used  Substance Use Topics   Alcohol use: No    Alcohol/week: 0.0 standard drinks   Drug use: No     Review of Systems  Constitutional: Positive fever/chills and body aches Eyes: No visual changes. No discharge ENT: Positive for nasal congestion and sore throat Cardiovascular: no chest pain. Respiratory: no cough. No SOB. Gastrointestinal: No abdominal pain.  No nausea, no vomiting.  No diarrhea.  No constipation. Genitourinary: Negative for dysuria. No hematuria Musculoskeletal: Negative for musculoskeletal pain. Skin: Negative for rash, abrasions, lacerations, ecchymosis. Neurological: Negative for headaches, focal weakness or numbness.  10 System ROS otherwise negative.  ____________________________________________   PHYSICAL EXAM:  VITAL SIGNS: ED Triage Vitals  Enc Vitals Group     BP 11/10/21 1347 140/76     Pulse Rate 11/10/21 1347 65     Resp 11/10/21 1347 18     Temp 11/10/21 1347 98.7 F (37.1 C)     Temp Source 11/10/21 1347 Oral     SpO2 11/10/21 1347 94 %     Weight 11/10/21 1348 244 lb (110.7 kg)     Height 11/10/21 1348 5\' 2"  (1.575 m)     Head Circumference --      Peak Flow --      Pain Score 11/10/21 1347 9     Pain Loc --      Pain Edu? --      Excl. in [place]? --      Constitutional: Alert and oriented. Well appearing and in no acute distress. Eyes: Conjunctivae are normal. PERRL. EOMI. Head: Atraumatic. ENT:      Ears:       Nose:  Moderate clear congestion/rhinnorhea.      Mouth/Throat: Mucous membranes are moist.  Oropharynx is mildly erythematous but nonedematous and no exudates. Neck: No stridor.   Hematological/Lymphatic/Immunilogical: Enlarged bilateral anterior cervical lymphadenopathy. Cardiovascular: Normal rate, regular rhythm. Normal S1 and S2.  Good peripheral  circulation. Respiratory: Normal respiratory effort without tachypnea or retractions. Lungs with crackles bilaterally.Kermit Balo air entry to the bases with no decreased or absent breath sounds. Gastrointestinal: Bowel sounds 4 quadrants. Soft and nontender to palpation. No guarding or rigidity. No palpable masses. No distention. No CVA tenderness.   Musculoskeletal: Full range of motion to all extremities. No gross deformities appreciated. Neurologic:  Normal speech and language. No gross focal neurologic deficits are appreciated.  Skin:  Skin is warm, dry and intact. No rash noted. Psychiatric: Mood and affect are normal. Speech and behavior are normal. Patient exhibits appropriate insight and judgement.   ____________________________________________   LABS (all labs ordered are listed, but only abnormal results are displayed)  Labs Reviewed  RESP PANEL BY RT-PCR (FLU A&B, COVID) ARPGX2 - Abnormal; Notable for the following components:      Result Value   Influenza A by PCR POSITIVE (*)    All other components within normal limits  CBC - Abnormal; Notable for the following components:   WBC 2.6 (*)    Platelets 124 (*)    All other components within normal limits  BASIC METABOLIC PANEL - Abnormal; Notable for the following components:   Glucose, Bld 114 (*)    Calcium 8.8 (*)    All other components within normal limits  D-DIMER, QUANTITATIVE - Abnormal; Notable for the following components:   D-Dimer, Quant 1.04 (*)    All other components within normal limits    ____________________________________________  EKG   ____________________________________________  RADIOLOGY I personally viewed and evaluated these images as part of my medical decision making, as well as reviewing the written report by the radiologist.  ED Provider Interpretation: Streaky opacities consistent with atypical/viral pneumonia in the setting of a positive influenza test, likely viral pneumonia.  PE chest with no evidence of PE  DG Chest 2 View  Result Date: 11/10/2021 CLINICAL DATA:  Cough, hemoptysis, COVID exposure EXAM: CHEST - 2 VIEW COMPARISON:  10/11/2018 FINDINGS: Slight increased mid and lower lung ill-defined streaky and nodular bronchovascular opacities may represent atypical pneumonia and or viral pneumonia. Slightly lower lung volumes. Minor basilar atelectasis. Negative for edema, effusion, or pneumothorax. Normal heart size and vascularity. No osseous finding. IMPRESSION: Mid and lower lung streaky nodular bronchovascular opacities bilaterally suggest atypical or viral pneumonia. Recommend radiographic follow-up to document resolution. Electronically Signed   By: Jerilynn Mages.  Shick M.D.   On: 11/10/2021 14:13   CT Angio Chest PE W and/or Wo Contrast  Result Date: 11/10/2021 CLINICAL DATA:  Cough, body aches, chest pain and hemoptysis. Elevated D-dimer. EXAM: CT ANGIOGRAPHY CHEST WITH CONTRAST TECHNIQUE: Multidetector CT imaging of the chest was performed using the standard protocol during bolus administration of intravenous contrast. Multiplanar CT image reconstructions and MIPs were obtained to evaluate the vascular anatomy. CONTRAST:  53mL OMNIPAQUE IOHEXOL 350 MG/ML SOLN COMPARISON:  None. FINDINGS: Cardiovascular: The pulmonary arteries are adequately opacified. No evidence of pulmonary embolism to the segmental level. Subsegmental evaluation is somewhat limited due to respiratory motion artifact. The thoracic aorta is normal in caliber and demonstrates no evidence of  dissection or significant atherosclerosis. The heart size is normal. No pericardial fluid identified. Some calcified coronary artery plaque is visualized. Mediastinum/Nodes: No enlarged mediastinal, hilar, or axillary lymph nodes. The trachea and esophagus demonstrate no significant findings. Possible subtle nodularity within the right lobe of the thyroid gland measuring approximately 10 mm. Lungs/Pleura: Patchy airspace disease is present within the superior segment of the right lower lobe, inferior aspect of  the right middle lobe, left upper lobe and lateral left lower lobe. No overt edema, pleural fluid or pneumothorax identified. Upper Abdomen: The liver demonstrates diffuse steatosis. Musculoskeletal: No chest wall abnormality. No acute or significant osseous findings. Review of the MIP images confirms the above findings. IMPRESSION: 1. No evidence of pulmonary embolism. Subsegmental valuation is limited due to respiratory motion artifact. 2. Patchy airspace disease in both lungs suspicious for acute pneumonia. 3. Suggestion of 10 mm right thyroid nodule. Not clinically significant; no follow-up imaging recommended (ref: J Am Coll Radiol. 2015 Feb;12(2): 143-50). 4. Diffuse hepatic steatosis. Electronically Signed   By: Aletta Edouard M.D.   On: 11/10/2021 16:55    ____________________________________________    PROCEDURES  Procedure(s) performed:    Procedures    Medications  iohexol (OMNIPAQUE) 350 MG/ML injection 75 mL (75 mLs Intravenous Contrast Given 11/10/21 1638)  acetaminophen (TYLENOL) tablet 650 mg (650 mg Oral Given 11/10/21 1731)  ibuprofen (ADVIL) tablet 600 mg (600 mg Oral Given 11/10/21 1731)     ____________________________________________   INITIAL IMPRESSION / ASSESSMENT AND PLAN / ED COURSE  Pertinent labs & imaging results that were available during my care of the patient were reviewed by me and considered in my medical decision making (see chart for  details).  Review of the [place] CSRS was performed in accordance of the [place] prior to dispensing any controlled drugs.           Patient's diagnosis is consistent with flu.  Patient presents with multiple flulike symptoms.  Symptoms began 2 days ago.  Labs are otherwise reassuring.  She did have an elevation in her D-dimer which necessitated a PE chest though I had a low suspicion for PE based on the patient's presentation.  This was negative in regards to any pulmonary embolus.  Patient will be treated symptomatically with medications at home, given likely progression of influenza symptoms.  Return precautions discussed at length with both the patient and her husband who is currently with the patient.  Follow-up primary care as needed. Patient is given ED precautions to return to the ED for any worsening or new symptoms.     ____________________________________________  FINAL CLINICAL IMPRESSION(S) / ED DIAGNOSES  Final diagnoses:  Influenza      NEW MEDICATIONS STARTED DURING THIS VISIT:  ED Discharge Orders          Ordered    predniSONE (DELTASONE) 50 MG tablet  Daily with breakfast        11/10/21 1758    benzonatate (TESSALON PERLES) 100 MG capsule  3 times daily PRN        11/10/21 1758    ondansetron (ZOFRAN-ODT) 4 MG disintegrating tablet  Every 8 hours PRN        11/10/21 1758                This chart was dictated using voice recognition software/Dragon. Despite best efforts to proofread, errors can occur which can change the meaning. Any change was purely unintentional.    Darletta Moll, PA-C 11/10/21 1759    Blake Divine, MD 11/10/21 [id number]

## 2021-11-10 NOTE — ED Notes (Signed)
Pt with flu/covid like symptoms that started Sunday. Flu+ here.

## 2021-11-10 NOTE — Patient Instructions (Addendum)
As discussed, chest pain, swelling chest, I do not feel comfortable treating virtual medicine.  You need a physical exam vital signs and chest x-ray.  Possible EKG.  As discussed, please go to [facility] urgent care for in person visit today unless the health department as discussed is able to see you.

## 2021-11-11 ENCOUNTER — Ambulatory Visit: Payer: Managed Care, Other (non HMO) | Admitting: Gastroenterology

## 2021-11-12 ENCOUNTER — Telehealth: Payer: Managed Care, Other (non HMO) | Admitting: Physician Assistant

## 2021-11-12 DIAGNOSIS — J129 Viral pneumonia, unspecified: Secondary | ICD-10-CM | POA: Diagnosis not present

## 2021-11-12 DIAGNOSIS — J101 Influenza due to other identified influenza virus with other respiratory manifestations: Secondary | ICD-10-CM

## 2021-11-12 MED ORDER — PROMETHAZINE-DM 6.25-15 MG/5ML PO SYRP: 5.0000 mL | ORAL_SOLUTION | Freq: Four times a day (QID) | ORAL | 0 refills | Status: DC | PRN

## 2021-11-12 MED ORDER — ALBUTEROL SULFATE HFA 108 (90 BASE) MCG/ACT IN AERS: 2.0000 | INHALATION_SPRAY | Freq: Four times a day (QID) | RESPIRATORY_TRACT | 0 refills | Status: DC | PRN

## 2021-11-12 NOTE — Patient Instructions (Signed)
Yvonne [name], thank you for joining Leeanne Rio, PA-C for today's virtual visit.  While this provider is not your primary care provider (PCP), if your PCP is located in our provider database this encounter information will be shared with them immediately following your visit.  Consent: (Patient) Yvonne [name] provided verbal consent for this virtual visit at the beginning of the encounter.  Current Medications:  Current Outpatient Medications:    albuterol (VENTOLIN HFA) 108 (90 Base) MCG/ACT inhaler, Inhale 2 puffs into the lungs every 6 (six) hours as needed for wheezing or shortness of breath., Disp: 8 g, Rfl: 0   promethazine-dextromethorphan (PROMETHAZINE-DM) 6.25-15 MG/5ML syrup, Take 5 mLs by mouth 4 (four) times daily as needed for cough., Disp: 118 mL, Rfl: 0   acetaminophen (TYLENOL) 650 MG CR tablet, Take 650 mg by mouth every 8 (eight) hours as needed for pain., Disp: , Rfl:    amLODipine (NORVASC) 5 MG tablet, Take 1 tablet (5 mg total) by mouth daily., Disp: 90 tablet, Rfl: 3   baclofen (LIORESAL) 10 MG tablet, Take 1 tablet (10 mg total) by mouth 2 (two) times daily., Disp: 90 tablet, Rfl: 0   benzonatate (TESSALON PERLES) 100 MG capsule, Take 1 capsule (100 mg total) by mouth 3 (three) times daily as needed for cough., Disp: 30 capsule, Rfl: 0   clindamycin (CLINDAGEL) 1 % gel, Apply topically 2 (two) times daily., Disp: 30 g, Rfl: 4   DULoxetine (CYMBALTA) 60 MG capsule, Take 1 capsule (60 mg total) by mouth 2 (two) times daily., Disp: 180 capsule, Rfl: 1   fexofenadine (ALLEGRA ALLERGY) 180 MG tablet, Take 1 tablet (180 mg total) by mouth daily., Disp: 90 tablet, Rfl: 1   gabapentin (NEURONTIN) 100 MG capsule, Take 2 capsules (200 mg total) by mouth 3 (three) times daily., Disp: 180 capsule, Rfl: 0   hydroxychloroquine (PLAQUENIL) 200 MG tablet, Take 200 mg by mouth 2 (two) times daily., Disp: , Rfl:    latanoprost (XALATAN) 0.005 % ophthalmic solution, Place 1  drop into both eyes at bedtime., Disp: , Rfl:    Melatonin 5 MG CAPS, Take by mouth., Disp: , Rfl:    montelukast (SINGULAIR) 10 MG tablet, Take 1 tablet (10 mg total) by mouth at bedtime., Disp: 90 tablet, Rfl: 0   nortriptyline (PAMELOR) 10 MG capsule, Take 1 capsule (10 mg total) by mouth at bedtime., Disp: 90 capsule, Rfl: 0   omeprazole (PRILOSEC) 20 MG capsule, Take 1 capsule (20 mg total) by mouth daily., Disp: 90 capsule, Rfl: 3   ondansetron (ZOFRAN ODT) 8 MG disintegrating tablet, Take 1 tablet (8 mg total) by mouth 2 (two) times daily as needed for nausea or vomiting (migraine)., Disp: 30 tablet, Rfl: 1   ondansetron (ZOFRAN-ODT) 4 MG disintegrating tablet, Take 1 tablet (4 mg total) by mouth every 8 (eight) hours as needed for nausea or vomiting., Disp: 20 tablet, Rfl: 0   predniSONE (DELTASONE) 50 MG tablet, Take 1 tablet (50 mg total) by mouth daily with breakfast., Disp: 5 tablet, Rfl: 0   propranolol (INDERAL) 40 MG tablet, Take 1 tablet (40 mg total) by mouth 2 (two) times daily., Disp: 180 tablet, Rfl: 1   RINVOQ 15 MG TB24, Take 1 tablet by mouth daily., Disp: , Rfl:    SUMAtriptan (IMITREX) 50 MG tablet, TAKE 1 TABLET BY MOUTH ONCE FOR 1 DOSE. MAY REPEAT IN 2 HOURS IF HEADACHE PERSISTS OR RECURS, Disp: 30 tablet, Rfl: 1   timolol (TIMOPTIC)  0.25 % ophthalmic solution, Place 1 drop into both eyes daily., Disp: , Rfl:    XIIDRA 5 % SOLN, Place 1 drop into both eyes., Disp: , Rfl:    Medications ordered in this encounter:  Meds ordered this encounter  Medications   promethazine-dextromethorphan (PROMETHAZINE-DM) 6.25-15 MG/5ML syrup    Sig: Take 5 mLs by mouth 4 (four) times daily as needed for cough.    Dispense:  118 mL    Refill:  0    Order Specific Question:   Supervising Provider    Answer:   MILLER, BRIAN [[id number]]   albuterol (VENTOLIN HFA) 108 (90 Base) MCG/ACT inhaler    Sig: Inhale 2 puffs into the lungs every 6 (six) hours as needed for wheezing or shortness of  breath.    Dispense:  8 g    Refill:  0    Order Specific Question:   Supervising Provider    Answer:   Sabra Heck, [place]     *If you need refills on other medications prior to your next appointment, please contact your pharmacy*  Follow-Up: Call back or seek an in-person evaluation if the symptoms worsen or if the condition fails to improve as anticipated.  Other Instructions Please keep well-hydrated and try to rest. If you have a humidifier, run it in the bedroom at night. Complete entire course of the Prednisone. You can continue the [employer] and take the new prescription cough syrup I have sent in along with it. You can take plain Mucinex with this as well. Avoid your Theraflu/Dayquil while on the above medications. You can use tylenol if needed for headache.  Symptoms should only continue to improve from this point forward. If not or anything new/worsening, you need repeat evaluation in-person ASAP. Do not delay care.    If you have been instructed to have an in-person evaluation today at a local Urgent Care facility, please use the link below. It will take you to a list of all of our available [facility], including address, phone number and hours of operation. Please do not delay care.  [facility]  If you or a family member do not have a primary care provider, use the link below to schedule a visit and establish care. When you choose a [facility] primary care physician or advanced practice provider, you gain a long-term partner in health. Find a Primary Care Provider  Learn more about 's in-office and virtual care options: [facility]

## 2021-11-12 NOTE — Progress Notes (Signed)
Virtual Visit Consent   [name], you are scheduled for a virtual visit with a [name] provider today.     Just as with appointments in the office, your consent must be obtained to participate.  Your consent will be active for this visit and any virtual visit you may have with one of our providers in the next 365 days.     If you have a MyChart account, a copy of this consent can be sent to you electronically.  All virtual visits are billed to your insurance company just like a traditional visit in the office.    As this is a virtual visit, video technology does not allow for your provider to perform a traditional examination.  This may limit your provider's ability to fully assess your condition.  If your provider identifies any concerns that need to be evaluated in person or the need to arrange testing (such as labs, EKG, etc.), we will make arrangements to do so.     Although advances in technology are sophisticated, we cannot ensure that it will always work on either your end or our end.  If the connection with a video visit is poor, the visit may have to be switched to a telephone visit.  With either a video or telephone visit, we are not always able to ensure that we have a secure connection.     I need to obtain your verbal consent now.   Are you willing to proceed with your visit today?    Yvonne [name] has provided verbal consent on 11/12/2021 for a virtual visit (video or telephone).   Yvonne [name], [place]   Date: 11/12/2021 11:13 AM   Virtual Visit via Video Note   I, Yvonne [name], connected with  Yvonne [name]  ([id number], May 18, 1966) on 11/12/21 at 11:00 AM EST by a video-enabled telemedicine application and verified that I am speaking with the correct person using two identifiers.  Location: Patient: Virtual Visit Location Patient: Home Provider: Virtual Visit Location Provider: Home Office   I discussed the limitations of evaluation and  management by telemedicine and the availability of in person appointments. The patient expressed understanding and agreed to proceed.    History of Present Illness: Yvonne [name] is a 55 y.o. who identifies as a female who was assigned female at birth, and is being seen today for ongoing cough and congestion. Patient was recently see on 11/10/2021 for flu-like symptoms. Was diagnosed with influenza A and viral pneumonia per chest CTA. Was not started on antivirals at that time per patient report. Was given symptomatic treatment in the ER and sent home with Tessalon Perles 100 mg TID and Prednisone 50 mg daily. She notes that her fever, chills, aches have fully resolved and symptoms have improved but her cough is non-stop and is causing chest tenderness and inability to sleep despite rx medications. She has also taken OTC medications -- Theraflu, Nyquil/Dayquil. Is wondering if there is something else she could take for cough to help further.   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   COVID-19 12/19/2020   Cervical radicular pain 07/10/2020   Cervical facet joint syndrome 07/10/2020   Lumbar spondylosis 07/10/2020   Chronic pain syndrome 07/10/2020   Atherosclerosis of aorta ([place]) 03/25/2020   Arthralgia 03/19/2020   Otitis media 01/11/2020   Restless leg 01/01/2020   Weight loss counseling, encounter for 10/28/2019   Hair loss 10/28/2019   Hyperglycemia 10/28/2019   Morbid obesity with body mass index (BMI) of 40.0 or higher ([place]) 04/29/2019   Migraines 03/29/2019   Primary  osteoarthritis of both knees 01/26/2019   Screen for colon cancer 01/19/2019   Encounter for long-term (current) use of high-risk medication 01/19/2019   Non-seasonal allergic rhinitis 01/19/2019   Rheumatoid factor positive 01/17/2019   Prediabetes 12/15/2018   Chronic pain of both knees 11/17/2018   Right wrist pain 11/17/2018   GERD (gastroesophageal reflux disease) 03/31/2018   Skin lesion 03/01/2018   Hayfever 02/08/2018   Chronic nonintractable headache 02/08/2018   Hypertension 02/08/2018   Menopausal hot flushes 02/08/2018    Allergies:  Allergies  Allergen Reactions   Dried Figs [Ficus] Anaphylaxis    Tongue tingling & felt like throat was swelling.     Flonase [Fluticasone Propionate]     Increases eye pressure    Sulfa Antibiotics Swelling   Tussionex Pennkinetic Er [Hydrocod Polst-Cpm Polst Er] Itching    Hyperactive    Medications:  Current Outpatient Medications:    albuterol (VENTOLIN HFA) 108 (90 Base) MCG/ACT inhaler, Inhale 2 puffs into the lungs every 6 (six) hours as needed for wheezing or shortness of breath., Disp: 8 g, Rfl: 0   promethazine-dextromethorphan (PROMETHAZINE-DM) 6.25-15 MG/5ML syrup, Take 5 mLs by mouth 4 (four) times daily as needed for cough., Disp: 118 mL, Rfl: 0   acetaminophen (TYLENOL) 650 MG CR tablet, Take 650 mg by mouth every 8 (eight) hours as needed for pain., Disp: , Rfl:    amLODipine (NORVASC) 5 MG tablet, Take 1 tablet (5 mg total) by mouth daily., Disp: 90 tablet, Rfl: 3   baclofen (LIORESAL) 10 MG tablet, Take 1 tablet (10 mg total) by mouth 2 (two) times daily., Disp: 90 tablet, Rfl: 0   benzonatate (TESSALON PERLES) 100 MG capsule, Take 1 capsule (100 mg total) by mouth 3 (three) times daily as needed for cough., Disp: 30 capsule, Rfl: 0   clindamycin (CLINDAGEL) 1 % gel, Apply topically 2 (two) times daily., Disp: 30 g, Rfl: 4   DULoxetine (CYMBALTA) 60 MG capsule, Take 1 capsule (60 mg total) by mouth 2 (two) times daily.,  Disp: 180 capsule, Rfl: 1   fexofenadine (ALLEGRA ALLERGY) 180 MG tablet, Take 1 tablet (180 mg total) by mouth daily., Disp: 90 tablet, Rfl: 1   gabapentin (NEURONTIN) 100 MG capsule, Take 2 capsules (200 mg total) by mouth 3 (three) times daily., Disp: 180 capsule, Rfl: 0   hydroxychloroquine (PLAQUENIL) 200 MG tablet, Take 200 mg by mouth 2 (two) times daily., Disp: , Rfl:    latanoprost (XALATAN) 0.005 % ophthalmic solution, Place 1 drop into both eyes at bedtime., Disp: , Rfl:    Melatonin 5 MG CAPS, Take by mouth., Disp: , Rfl:    montelukast (SINGULAIR) 10 MG tablet, Take 1 tablet (10 mg total) by mouth at bedtime., Disp: 90 tablet, Rfl: 0   nortriptyline (PAMELOR) 10 MG capsule, Take 1 capsule (10 mg total) by mouth at bedtime., Disp: 90 capsule, Rfl: 0  omeprazole (PRILOSEC) 20 MG capsule, Take 1 capsule (20 mg total) by mouth daily., Disp: 90 capsule, Rfl: 3   ondansetron (ZOFRAN ODT) 8 MG disintegrating tablet, Take 1 tablet (8 mg total) by mouth 2 (two) times daily as needed for nausea or vomiting (migraine)., Disp: 30 tablet, Rfl: 1   ondansetron (ZOFRAN-ODT) 4 MG disintegrating tablet, Take 1 tablet (4 mg total) by mouth every 8 (eight) hours as needed for nausea or vomiting., Disp: 20 tablet, Rfl: 0   predniSONE (DELTASONE) 50 MG tablet, Take 1 tablet (50 mg total) by mouth daily with breakfast., Disp: 5 tablet, Rfl: 0   propranolol (INDERAL) 40 MG tablet, Take 1 tablet (40 mg total) by mouth 2 (two) times daily., Disp: 180 tablet, Rfl: 1   RINVOQ 15 MG TB24, Take 1 tablet by mouth daily., Disp: , Rfl:    SUMAtriptan (IMITREX) 50 MG tablet, TAKE 1 TABLET BY MOUTH ONCE FOR 1 DOSE. MAY REPEAT IN 2 HOURS IF HEADACHE PERSISTS OR RECURS, Disp: 30 tablet, Rfl: 1   timolol (TIMOPTIC) 0.25 % ophthalmic solution, Place 1 drop into both eyes daily., Disp: , Rfl:    XIIDRA 5 % SOLN, Place 1 drop into both eyes., Disp: , Rfl:   Observations/Objective: Patient is well-developed, well-nourished  in no acute distress.  Resting comfortably at home.  Head is normocephalic, atraumatic.  No labored breathing. Speech is clear and coherent with logical content.  Patient is alert and oriented at baseline.   Assessment and Plan: 1. Influenza A - promethazine-dextromethorphan (PROMETHAZINE-DM) 6.25-15 MG/5ML syrup; Take 5 mLs by mouth 4 (four) times daily as needed for cough.  Dispense: 118 mL; Refill: 0 - albuterol (VENTOLIN HFA) 108 (90 Base) MCG/ACT inhaler; Inhale 2 puffs into the lungs every 6 (six) hours as needed for wheezing or shortness of breath.  Dispense: 8 g; Refill: 0  2. Viral pneumonia - promethazine-dextromethorphan (PROMETHAZINE-DM) 6.25-15 MG/5ML syrup; Take 5 mLs by mouth 4 (four) times daily as needed for cough.  Dispense: 118 mL; Refill: 0 - albuterol (VENTOLIN HFA) 108 (90 Base) MCG/ACT inhaler; Inhale 2 puffs into the lungs every 6 (six) hours as needed for wheezing or shortness of breath.  Dispense: 8 g; Refill: 0  Patient with viral pneumonia 2/2 influenza A. Most symptoms improving with resolution of fever, aches, chills. Still with substantial cough likely in part due to significant bronchospasm as well. Will have her complete course of Prednisone. Continue Tessalon. Will have her take plain Mucinex instead of the Theraflu/Dayquil/Nyquil. Will Rx promethazine-DM cough syrup for her to take as directed. Unfortunately she is now outside the window for antivirals. Strict ER precautions reviewed with patient.   Follow Up Instructions: I discussed the assessment and treatment plan with the patient. The patient was provided an opportunity to ask questions and all were answered. The patient agreed with the plan and demonstrated an understanding of the instructions.  A copy of instructions were sent to the patient via MyChart unless otherwise noted below.   The patient was advised to call back or seek an in-person evaluation if the symptoms worsen or if the condition fails to  improve as anticipated.  Time:  I spent 15 minutes with the patient via telehealth technology discussing the above problems/concerns.    Yvonne Rio, PA-C

## 2021-11-14 ENCOUNTER — Telehealth: Payer: Managed Care, Other (non HMO) | Admitting: Nurse Practitioner

## 2021-11-14 DIAGNOSIS — R062 Wheezing: Secondary | ICD-10-CM

## 2021-11-14 MED ORDER — IPRATROPIUM-ALBUTEROL 0.5-2.5 (3) MG/3ML IN SOLN: 3.0000 mL | RESPIRATORY_TRACT | 0 refills | Status: DC | PRN

## 2021-11-14 NOTE — Progress Notes (Signed)
Virtual Visit Consent   Yvonne [name], you are scheduled for a virtual visit with Yvonne Hassell Done, FNP, a [facility] provider, today.     Just as with appointments in the office, your consent must be obtained to participate.  Your consent will be active for this visit and any virtual visit you may have with one of our providers in the next 365 days.     If you have a MyChart account, a copy of this consent can be sent to you electronically.  All virtual visits are billed to your insurance company just like a traditional visit in the office.    As this is a virtual visit, video technology does not allow for your provider to perform a traditional examination.  This may limit your provider's ability to fully assess your condition.  If your provider identifies any concerns that need to be evaluated in person or the need to arrange testing (such as labs, EKG, etc.), we will make arrangements to do so.     Although advances in technology are sophisticated, we cannot ensure that it will always work on either your end or our end.  If the connection with a video visit is poor, the visit may have to be switched to a telephone visit.  With either a video or telephone visit, we are not always able to ensure that we have a secure connection.     I need to obtain your verbal consent now.   Are you willing to proceed with your visit today? YES   Yvonne [name] has provided verbal consent on 11/14/2021 for a virtual visit (video or telephone).   Yvonne Hassell Done, FNP   Date: 11/14/2021 1:41 PM   Virtual Visit via Video Note   I, Yvonne [name], connected with Yvonne [name] ([id number], 03-16-1966) on 11/14/21 at  2:15 PM EST by a video-enabled telemedicine application and verified that I am speaking with the correct person using two identifiers.  Location: Patient: Virtual Visit Location Patient: Home Provider: Virtual Visit Location Provider: Mobile   I discussed the limitations  of evaluation and management by telemedicine and the availability of in person appointments. The patient expressed understanding and agreed to proceed.    History of Present Illness: Yvonne [name] is a 55 y.o. who identifies as a female who was assigned female at birth, and is being seen today for cough and wheezing .  HPI: Patient did video visit on 11/12/21. She was dx with viral pneumonia and was given albuterol and promethazine DM. She is still coughing and wheezing with chest tight when taking a deep breath. The albuterol inhaler helps but does not last longer then 30 minutes.    Review of Systems  Constitutional:  Positive for malaise/fatigue. Negative for chills and fever.  HENT:  Positive for congestion.   Respiratory:  Positive for cough, sputum production, shortness of breath and wheezing.   Musculoskeletal:  Positive for myalgias.  Neurological:  Positive for headaches.   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  COVID-19 12/19/2020   Cervical radicular pain 07/10/2020   Cervical facet joint syndrome 07/10/2020   Lumbar spondylosis 07/10/2020   Chronic pain syndrome 07/10/2020   Atherosclerosis of aorta ([place]) 03/25/2020   Arthralgia 03/19/2020   Otitis media 01/11/2020   Restless leg 01/01/2020   Weight loss counseling, encounter for 10/28/2019   Hair loss 10/28/2019   Hyperglycemia 10/28/2019   Morbid obesity with body mass index (BMI) of 40.0 or higher ([place]) 04/29/2019   Migraines 03/29/2019   Primary osteoarthritis of both knees 01/26/2019   Screen for colon cancer  01/19/2019   Encounter for long-term (current) use of high-risk medication 01/19/2019   Non-seasonal allergic rhinitis 01/19/2019   Rheumatoid factor positive 01/17/2019   Prediabetes 12/15/2018   Chronic pain of both knees 11/17/2018   Right wrist pain 11/17/2018   GERD (gastroesophageal reflux disease) 03/31/2018   Skin lesion 03/01/2018   Hayfever 02/08/2018   Chronic nonintractable headache 02/08/2018   Hypertension 02/08/2018   Menopausal hot flushes 02/08/2018    Allergies:  Allergies  Allergen Reactions   Dried Figs [Ficus] Anaphylaxis    Tongue tingling & felt like throat was swelling.     Flonase [Fluticasone Propionate]     Increases eye pressure    Sulfa Antibiotics Swelling   Tussionex Pennkinetic Er [Hydrocod Polst-Cpm Polst Er] Itching    Hyperactive    Medications:  Current Outpatient Medications:    acetaminophen (TYLENOL) 650 MG CR tablet, Take 650 mg by mouth every 8 (eight) hours as needed for pain., Disp: , Rfl:    albuterol (VENTOLIN HFA) 108 (90 Base) MCG/ACT inhaler, Inhale 2 puffs into the lungs every 6 (six) hours as needed for wheezing or shortness of breath., Disp: 8 g, Rfl: 0   amLODipine (NORVASC) 5 MG tablet, Take 1 tablet (5 mg total) by mouth daily., Disp: 90 tablet, Rfl: 3   baclofen (LIORESAL) 10 MG tablet, Take 1 tablet (10 mg total) by mouth 2 (two) times daily., Disp: 90 tablet, Rfl: 0   benzonatate (TESSALON PERLES) 100 MG capsule, Take 1 capsule (100 mg total) by mouth 3 (three) times daily as needed for cough., Disp: 30 capsule, Rfl: 0   clindamycin (CLINDAGEL) 1 % gel, Apply topically 2 (two) times daily., Disp: 30 g, Rfl: 4   DULoxetine (CYMBALTA) 60 MG capsule, Take 1 capsule (60 mg total) by mouth 2 (two) times daily., Disp: 180 capsule, Rfl: 1   fexofenadine (ALLEGRA ALLERGY) 180 MG tablet, Take 1 tablet (180 mg total) by mouth daily., Disp: 90 tablet, Rfl: 1   gabapentin (NEURONTIN) 100 MG capsule, Take 2 capsules (200 mg total) by  mouth 3 (three) times daily., Disp: 180 capsule, Rfl: 0   hydroxychloroquine (PLAQUENIL) 200 MG tablet, Take 200 mg by mouth 2 (two) times daily., Disp: , Rfl:    latanoprost (XALATAN) 0.005 % ophthalmic solution, Place 1 drop into both eyes at bedtime., Disp: , Rfl:    Melatonin 5 MG CAPS, Take by mouth., Disp: , Rfl:    montelukast (SINGULAIR) 10 MG tablet, Take 1 tablet (10 mg total) by mouth at bedtime., Disp: 90 tablet, Rfl: 0   nortriptyline (PAMELOR) 10 MG capsule, Take 1 capsule (10 mg total) by mouth at bedtime., Disp: 90 capsule, Rfl: 0   omeprazole (PRILOSEC) 20 MG capsule, Take 1 capsule (20 mg total) by mouth daily., Disp: 90 capsule, Rfl: 3   ondansetron (ZOFRAN ODT) 8 MG disintegrating tablet, Take 1 tablet (8 mg total) by mouth 2 (two) times  daily as needed for nausea or vomiting (migraine)., Disp: 30 tablet, Rfl: 1   ondansetron (ZOFRAN-ODT) 4 MG disintegrating tablet, Take 1 tablet (4 mg total) by mouth every 8 (eight) hours as needed for nausea or vomiting., Disp: 20 tablet, Rfl: 0   predniSONE (DELTASONE) 50 MG tablet, Take 1 tablet (50 mg total) by mouth daily with breakfast., Disp: 5 tablet, Rfl: 0   promethazine-dextromethorphan (PROMETHAZINE-DM) 6.25-15 MG/5ML syrup, Take 5 mLs by mouth 4 (four) times daily as needed for cough., Disp: 118 mL, Rfl: 0   propranolol (INDERAL) 40 MG tablet, Take 1 tablet (40 mg total) by mouth 2 (two) times daily., Disp: 180 tablet, Rfl: 1   RINVOQ 15 MG TB24, Take 1 tablet by mouth daily., Disp: , Rfl:    SUMAtriptan (IMITREX) 50 MG tablet, TAKE 1 TABLET BY MOUTH ONCE FOR 1 DOSE. MAY REPEAT IN 2 HOURS IF HEADACHE PERSISTS OR RECURS, Disp: 30 tablet, Rfl: 1   timolol (TIMOPTIC) 0.25 % ophthalmic solution, Place 1 drop into both eyes daily., Disp: , Rfl:    XIIDRA 5 % SOLN, Place 1 drop into both eyes., Disp: , Rfl:   Observations/Objective: Patient is well-developed, well-nourished in no acute distress.  Resting comfortably  at home.  Head is  normocephalic, atraumatic.  No labored breathing.  Speech is clear and coherent with logical content.  Patient is alert and oriented at baseline.  Raspy voice  Assessment and Plan:  [name] in today with chief complaint of No chief complaint on file.   1. Wheezing Nebulizer ordered - For home use only DME Nebulizer machine - ipratropium-albuterol (DUONEB) 0.5-2.5 (3) MG/3ML SOLN; Take 3 mLs by nebulization every 4 (four) hours as needed.  Dispense: 360 mL; Refill: 0   Follow Up Instructions: I discussed the assessment and treatment plan with the patient. The patient was provided an opportunity to ask questions and all were answered. The patient agreed with the plan and demonstrated an understanding of the instructions.  A copy of instructions were sent to the patient via MyChart.  The patient was advised to call back or seek an in-person evaluation if the symptoms worsen or if the condition fails to improve as anticipated.  Time:  I spent 30 minutes with the patient via telehealth technology discussing the above problems/concerns.    Yvonne Hassell Done, FNP

## 2021-11-15 ENCOUNTER — Encounter: Payer: Self-pay | Admitting: Family

## 2021-11-16 ENCOUNTER — Telehealth: Payer: Self-pay

## 2021-11-16 DIAGNOSIS — J189 Pneumonia, unspecified organism: Secondary | ICD-10-CM

## 2021-11-16 NOTE — Telephone Encounter (Signed)
° °  Yvonne [name]   Call patient I reviewed emergency room and video visits. Please call to follow-up how much blood she was seen to discuss bedtime for nickel.  Is a blood tinge in her sputum when she blows her nose or when she coughs or both? I would recommend that we arrange a pulmonology consult as she has had quite a severe reaction to the flu.  Please triage and ensure that she is not having shortness of breath feel that she is to be seen back in the emergency room. I placed referral. Please ensure she has 6 follow-up with me as she will need repeat CXR after pneumonia and to discuss CT angio done emergency room as there were incidental findings  Including  thyroid lesion, fatty liver disease.  I would recommend both an ultrasound of thyroid and the liver had about a follow-up appointment. If agreeable, let me know so I can order.    Patient presents emergency room 12/13 diagnosed influenza.  Chest x-ray showed atypical viral pneumonia.  I will proceed with CT angiogram performed due to elevated D-dimer; CTA  negative for PE however indicated thyroid mass, hepatic steatosis.  She had subsequent virtual visits when she was provided with Promethazine DM, albuterol inhaler and then nebulizer with ipratropium bromide.

## 2021-11-16 NOTE — Telephone Encounter (Signed)
I hope all is well with you, I tested positive for the Flu at the ER on the 13th and the Dr there said it was going to be ruff and it sure has been. He also told me that spitting up blood was normal for this and I'm good with that but here we are on Sunday the 18th I'm still spitting up some blood. What do you recommend I do at this point? I still feel bad and have a nasty cough and then the blood issue? Just looking for some advice with this, thanks so much!!!!!

## 2021-11-16 NOTE — Addendum Note (Signed)
Addended by: Burnard Hawthorne on: 11/16/2021 01:26 PM   Modules accepted: Orders

## 2021-11-16 NOTE — Telephone Encounter (Signed)
FYI I spoke to patient who stated that she feeling & doing better. She said that when coughs & gets up mucus there has been right much blood in it at times. She said that it gradually has been less & that only blood tinged at times when blowing her nose. She was okay with pulmonology referral bc she does want to make sure she does not have any other issues in regards to lungs & have someone listen to her again. She is using the inhaler but no longer the breathing treatment. I have provided a work note bc she wants to go back [place].   For now bc she has missed so much work, she doesn't want to do the [place] of thyroid or liver. Maybe in the future she said.

## 2021-11-17 ENCOUNTER — Encounter: Payer: Self-pay | Admitting: Family

## 2021-11-18 ENCOUNTER — Encounter: Payer: Self-pay | Admitting: Family

## 2021-11-18 ENCOUNTER — Telehealth: Payer: Managed Care, Other (non HMO) | Admitting: Family

## 2021-11-18 DIAGNOSIS — J208 Acute bronchitis due to other specified organisms: Secondary | ICD-10-CM

## 2021-11-18 DIAGNOSIS — B9689 Other specified bacterial agents as the cause of diseases classified elsewhere: Secondary | ICD-10-CM

## 2021-11-18 DIAGNOSIS — Z8709 Personal history of other diseases of the respiratory system: Secondary | ICD-10-CM

## 2021-11-18 MED ORDER — AZITHROMYCIN 250 MG PO TABS: ORAL_TABLET | ORAL | 0 refills | Status: DC

## 2021-11-18 NOTE — Progress Notes (Signed)
Virtual Visit Consent   Yvonne [name], you are scheduled for a virtual visit with a [facility] provider today.     Just as with appointments in the office, your consent must be obtained to participate.  Your consent will be active for this visit and any virtual visit you may have with one of our providers in the next 365 days.     If you have a MyChart account, a copy of this consent can be sent to you electronically.  All virtual visits are billed to your insurance company just like a traditional visit in the office.    As this is a virtual visit, video technology does not allow for your provider to perform a traditional examination.  This may limit your provider's ability to fully assess your condition.  If your provider identifies any concerns that need to be evaluated in person or the need to arrange testing (such as labs, EKG, etc.), we will make arrangements to do so.     Although advances in technology are sophisticated, we cannot ensure that it will always work on either your end or our end.  If the connection with a video visit is poor, the visit may have to be switched to a telephone visit.  With either a video or telephone visit, we are not always able to ensure that we have a secure connection.     I need to obtain your verbal consent now.   Are you willing to proceed with your visit today?    Yvonne [name] has provided verbal consent on 11/18/2021 for a virtual visit (video or telephone).   [name], FNP   Date: 11/18/2021 4:21 PM   Virtual Visit via Video Note   I, [name], connected with  Yvonne [name]  ([id number], 04/21/1966) on 11/18/21 at  4:15 PM EST by a video-enabled telemedicine application and verified that I am speaking with the correct person using two identifiers.  Location: Patient: Virtual Visit Location Patient: Home Provider: Virtual Visit Location Provider: Home Office   I discussed the limitations of evaluation and management by  telemedicine and the availability of in person appointments. The patient expressed understanding and agreed to proceed.    History of Present Illness: Yvonne [name] is a 55 y.o. who identifies as a female who was assigned female at birth, and is being seen today for cough. She was diagnosed with flu on 11/10/21.   HPI: Cough This is a new problem. The current episode started 1 to 4 weeks ago. The problem has been gradually worsening. The problem occurs every few minutes. The cough is Productive of sputum. Associated symptoms include ear congestion, headaches, myalgias, nasal congestion and postnasal drip. Pertinent negatives include no chills, ear pain, fever, shortness of breath or wheezing. She has tried rest and OTC cough suppressant for the symptoms. The treatment provided mild relief.   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   COVID-19 12/19/2020   Cervical radicular pain 07/10/2020   Cervical facet joint syndrome 07/10/2020  Lumbar spondylosis 07/10/2020   Chronic pain syndrome 07/10/2020   Atherosclerosis of aorta ([place]) 03/25/2020   Arthralgia 03/19/2020   Otitis media 01/11/2020   Restless leg 01/01/2020   Weight loss counseling, encounter for 10/28/2019   Hair loss 10/28/2019   Hyperglycemia 10/28/2019   Morbid obesity with body mass index (BMI) of 40.0 or higher ([place]) 04/29/2019   Migraines 03/29/2019   Primary osteoarthritis of both knees 01/26/2019   Screen for colon cancer 01/19/2019   Encounter for long-term (current) use of high-risk medication 01/19/2019    Non-seasonal allergic rhinitis 01/19/2019   Rheumatoid factor positive 01/17/2019   Prediabetes 12/15/2018   Chronic pain of both knees 11/17/2018   Right wrist pain 11/17/2018   GERD (gastroesophageal reflux disease) 03/31/2018   Skin lesion 03/01/2018   Hayfever 02/08/2018   Chronic nonintractable headache 02/08/2018   Hypertension 02/08/2018   Menopausal hot flushes 02/08/2018    Allergies:  Allergies  Allergen Reactions   Dried Figs [Ficus] Anaphylaxis    Tongue tingling & felt like throat was swelling.     Flonase [Fluticasone Propionate]     Increases eye pressure    Sulfa Antibiotics Swelling   Tussionex Pennkinetic Er [Hydrocod Polst-Cpm Polst Er] Itching    Hyperactive    Medications:  Current Outpatient Medications:    azithromycin (ZITHROMAX) 250 MG tablet, Take 500 mg once, then 250 mg for four days, Disp: 6 tablet, Rfl: 0   acetaminophen (TYLENOL) 650 MG CR tablet, Take 650 mg by mouth every 8 (eight) hours as needed for pain., Disp: , Rfl:    albuterol (VENTOLIN HFA) 108 (90 Base) MCG/ACT inhaler, Inhale 2 puffs into the lungs every 6 (six) hours as needed for wheezing or shortness of breath., Disp: 8 g, Rfl: 0   amLODipine (NORVASC) 5 MG tablet, Take 1 tablet (5 mg total) by mouth daily., Disp: 90 tablet, Rfl: 3   baclofen (LIORESAL) 10 MG tablet, Take 1 tablet (10 mg total) by mouth 2 (two) times daily., Disp: 90 tablet, Rfl: 0   benzonatate (TESSALON PERLES) 100 MG capsule, Take 1 capsule (100 mg total) by mouth 3 (three) times daily as needed for cough., Disp: 30 capsule, Rfl: 0   clindamycin (CLINDAGEL) 1 % gel, Apply topically 2 (two) times daily., Disp: 30 g, Rfl: 4   DULoxetine (CYMBALTA) 60 MG capsule, Take 1 capsule (60 mg total) by mouth 2 (two) times daily., Disp: 180 capsule, Rfl: 1   fexofenadine (ALLEGRA ALLERGY) 180 MG tablet, Take 1 tablet (180 mg total) by mouth daily., Disp: 90 tablet, Rfl: 1   gabapentin (NEURONTIN) 100 MG capsule, Take 2 capsules  (200 mg total) by mouth 3 (three) times daily., Disp: 180 capsule, Rfl: 0   hydroxychloroquine (PLAQUENIL) 200 MG tablet, Take 200 mg by mouth 2 (two) times daily., Disp: , Rfl:    ipratropium-albuterol (DUONEB) 0.5-2.5 (3) MG/3ML SOLN, Take 3 mLs by nebulization every 4 (four) hours as needed., Disp: 360 mL, Rfl: 0   latanoprost (XALATAN) 0.005 % ophthalmic solution, Place 1 drop into both eyes at bedtime., Disp: , Rfl:    Melatonin 5 MG CAPS, Take by mouth., Disp: , Rfl:    montelukast (SINGULAIR) 10 MG tablet, Take 1 tablet (10 mg total) by mouth at bedtime., Disp: 90 tablet, Rfl: 0   nortriptyline (PAMELOR) 10 MG capsule, Take 1 capsule (10 mg total) by mouth at bedtime., Disp: 90 capsule, Rfl: 0   omeprazole (PRILOSEC) 20 MG capsule, Take 1 capsule (20 mg  total) by mouth daily., Disp: 90 capsule, Rfl: 3   ondansetron (ZOFRAN ODT) 8 MG disintegrating tablet, Take 1 tablet (8 mg total) by mouth 2 (two) times daily as needed for nausea or vomiting (migraine)., Disp: 30 tablet, Rfl: 1   ondansetron (ZOFRAN-ODT) 4 MG disintegrating tablet, Take 1 tablet (4 mg total) by mouth every 8 (eight) hours as needed for nausea or vomiting., Disp: 20 tablet, Rfl: 0   promethazine-dextromethorphan (PROMETHAZINE-DM) 6.25-15 MG/5ML syrup, Take 5 mLs by mouth 4 (four) times daily as needed for cough., Disp: 118 mL, Rfl: 0   propranolol (INDERAL) 40 MG tablet, Take 1 tablet (40 mg total) by mouth 2 (two) times daily., Disp: 180 tablet, Rfl: 1   RINVOQ 15 MG TB24, Take 1 tablet by mouth daily., Disp: , Rfl:    SUMAtriptan (IMITREX) 50 MG tablet, TAKE 1 TABLET BY MOUTH ONCE FOR 1 DOSE. MAY REPEAT IN 2 HOURS IF HEADACHE PERSISTS OR RECURS, Disp: 30 tablet, Rfl: 1   timolol (TIMOPTIC) 0.25 % ophthalmic solution, Place 1 drop into both eyes daily., Disp: , Rfl:    XIIDRA 5 % SOLN, Place 1 drop into both eyes., Disp: , Rfl:   Observations/Objective: Patient is well-developed, well-nourished in no acute distress.   Resting comfortably  at home.  Head is normocephalic, atraumatic.  No labored breathing.  Speech is clear and coherent with logical content.  Patient is alert and oriented at baseline.  Nasal congestion Coarse cough  Assessment and Plan: 1. H/O influenza - azithromycin (ZITHROMAX) 250 MG tablet; Take 500 mg once, then 250 mg for four days  Dispense: 6 tablet; Refill: 0  2. Acute bacterial bronchitis - azithromycin (ZITHROMAX) 250 MG tablet; Take 500 mg once, then 250 mg for four days  Dispense: 6 tablet; Refill: 0  - Take meds as prescribed - Use a cool mist humidifier  -Use saline nose sprays frequently -Force fluids -For any cough or congestion  Use plain Mucinex- regular strength or max strength is fine -For fever or aces or pains- take tylenol or ibuprofen. -Throat lozenges if help Follow up if symptoms worsen or do not improve   Follow Up Instructions: I discussed the assessment and treatment plan with the patient. The patient was provided an opportunity to ask questions and all were answered. The patient agreed with the plan and demonstrated an understanding of the instructions.  A copy of instructions were sent to the patient via MyChart unless otherwise noted below.     The patient was advised to call back or seek an in-person evaluation if the symptoms worsen or if the condition fails to improve as anticipated.  Time:  I spent 12 minutes with the patient via telehealth technology discussing the above problems/concerns.    [name], FNP

## 2021-11-19 NOTE — Telephone Encounter (Signed)
LMTCB

## 2021-11-20 ENCOUNTER — Telehealth: Payer: Managed Care, Other (non HMO) | Admitting: Physician Assistant

## 2021-11-20 DIAGNOSIS — R3989 Other symptoms and signs involving the genitourinary system: Secondary | ICD-10-CM | POA: Diagnosis not present

## 2021-11-20 DIAGNOSIS — T3695XA Adverse effect of unspecified systemic antibiotic, initial encounter: Secondary | ICD-10-CM | POA: Diagnosis not present

## 2021-11-20 DIAGNOSIS — B379 Candidiasis, unspecified: Secondary | ICD-10-CM

## 2021-11-20 MED ORDER — NITROFURANTOIN MONOHYD MACRO 100 MG PO CAPS: 100.0000 mg | ORAL_CAPSULE | Freq: Two times a day (BID) | ORAL | 0 refills | Status: DC

## 2021-11-20 MED ORDER — FLUCONAZOLE 150 MG PO TABS: 150.0000 mg | ORAL_TABLET | Freq: Once | ORAL | 0 refills | Status: AC

## 2021-11-20 NOTE — Patient Instructions (Signed)
Yvonne [name], thank you for joining Mar Daring, PA-C for today's virtual visit.  While this provider is not your primary care provider (PCP), if your PCP is located in our provider database this encounter information will be shared with them immediately following your visit.  Consent: (Patient) Yvonne [name] provided verbal consent for this virtual visit at the beginning of the encounter.  Current Medications:  Current Outpatient Medications:    fluconazole (DIFLUCAN) 150 MG tablet, Take 1 tablet (150 mg total) by mouth once for 1 dose. May repeat in 72 hours if needed, Disp: 2 tablet, Rfl: 0   nitrofurantoin, macrocrystal-monohydrate, (MACROBID) 100 MG capsule, Take 1 capsule (100 mg total) by mouth 2 (two) times daily., Disp: 10 capsule, Rfl: 0   acetaminophen (TYLENOL) 650 MG CR tablet, Take 650 mg by mouth every 8 (eight) hours as needed for pain., Disp: , Rfl:    albuterol (VENTOLIN HFA) 108 (90 Base) MCG/ACT inhaler, Inhale 2 puffs into the lungs every 6 (six) hours as needed for wheezing or shortness of breath., Disp: 8 g, Rfl: 0   amLODipine (NORVASC) 5 MG tablet, Take 1 tablet (5 mg total) by mouth daily., Disp: 90 tablet, Rfl: 3   azithromycin (ZITHROMAX) 250 MG tablet, Take 500 mg once, then 250 mg for four days, Disp: 6 tablet, Rfl: 0   baclofen (LIORESAL) 10 MG tablet, Take 1 tablet (10 mg total) by mouth 2 (two) times daily., Disp: 90 tablet, Rfl: 0   benzonatate (TESSALON PERLES) 100 MG capsule, Take 1 capsule (100 mg total) by mouth 3 (three) times daily as needed for cough., Disp: 30 capsule, Rfl: 0   clindamycin (CLINDAGEL) 1 % gel, Apply topically 2 (two) times daily., Disp: 30 g, Rfl: 4   DULoxetine (CYMBALTA) 60 MG capsule, Take 1 capsule (60 mg total) by mouth 2 (two) times daily., Disp: 180 capsule, Rfl: 1   fexofenadine (ALLEGRA ALLERGY) 180 MG tablet, Take 1 tablet (180 mg total) by mouth daily., Disp: 90 tablet, Rfl: 1   gabapentin (NEURONTIN) 100 MG  capsule, Take 2 capsules (200 mg total) by mouth 3 (three) times daily., Disp: 180 capsule, Rfl: 0   hydroxychloroquine (PLAQUENIL) 200 MG tablet, Take 200 mg by mouth 2 (two) times daily., Disp: , Rfl:    ipratropium-albuterol (DUONEB) 0.5-2.5 (3) MG/3ML SOLN, Take 3 mLs by nebulization every 4 (four) hours as needed., Disp: 360 mL, Rfl: 0   latanoprost (XALATAN) 0.005 % ophthalmic solution, Place 1 drop into both eyes at bedtime., Disp: , Rfl:    Melatonin 5 MG CAPS, Take by mouth., Disp: , Rfl:    montelukast (SINGULAIR) 10 MG tablet, Take 1 tablet (10 mg total) by mouth at bedtime., Disp: 90 tablet, Rfl: 0   nortriptyline (PAMELOR) 10 MG capsule, Take 1 capsule (10 mg total) by mouth at bedtime., Disp: 90 capsule, Rfl: 0   omeprazole (PRILOSEC) 20 MG capsule, Take 1 capsule (20 mg total) by mouth daily., Disp: 90 capsule, Rfl: 3   ondansetron (ZOFRAN ODT) 8 MG disintegrating tablet, Take 1 tablet (8 mg total) by mouth 2 (two) times daily as needed for nausea or vomiting (migraine)., Disp: 30 tablet, Rfl: 1   ondansetron (ZOFRAN-ODT) 4 MG disintegrating tablet, Take 1 tablet (4 mg total) by mouth every 8 (eight) hours as needed for nausea or vomiting., Disp: 20 tablet, Rfl: 0   promethazine-dextromethorphan (PROMETHAZINE-DM) 6.25-15 MG/5ML syrup, Take 5 mLs by mouth 4 (four) times daily as needed for cough., Disp:  118 mL, Rfl: 0   propranolol (INDERAL) 40 MG tablet, Take 1 tablet (40 mg total) by mouth 2 (two) times daily., Disp: 180 tablet, Rfl: 1   RINVOQ 15 MG TB24, Take 1 tablet by mouth daily., Disp: , Rfl:    SUMAtriptan (IMITREX) 50 MG tablet, TAKE 1 TABLET BY MOUTH ONCE FOR 1 DOSE. MAY REPEAT IN 2 HOURS IF HEADACHE PERSISTS OR RECURS, Disp: 30 tablet, Rfl: 1   timolol (TIMOPTIC) 0.25 % ophthalmic solution, Place 1 drop into both eyes daily., Disp: , Rfl:    XIIDRA 5 % SOLN, Place 1 drop into both eyes., Disp: , Rfl:    Medications ordered in this encounter:  Meds ordered this encounter   Medications   nitrofurantoin, macrocrystal-monohydrate, (MACROBID) 100 MG capsule    Sig: Take 1 capsule (100 mg total) by mouth 2 (two) times daily.    Dispense:  10 capsule    Refill:  0    Order Specific Question:   Supervising Provider    Answer:   MILLER, BRIAN [[id number]]   fluconazole (DIFLUCAN) 150 MG tablet    Sig: Take 1 tablet (150 mg total) by mouth once for 1 dose. May repeat in 72 hours if needed    Dispense:  2 tablet    Refill:  0    Order Specific Question:   Supervising Provider    Answer:   Sabra Heck, [place]     *If you need refills on other medications prior to your next appointment, please contact your pharmacy*  Follow-Up: Call back or seek an in-person evaluation if the symptoms worsen or if the condition fails to improve as anticipated.  Other Instructions Urinary Tract Infection, Adult A urinary tract infection (UTI) is an infection of any part of the urinary tract. The urinary tract includes the kidneys, ureters, bladder, and urethra. These organs make, store, and get rid of urine in the body. An upper UTI affects the ureters and kidneys. A lower UTI affects the bladder and urethra. What are the causes? Most urinary tract infections are caused by bacteria in your genital area around your urethra, where urine leaves your body. These bacteria grow and cause inflammation of your urinary tract. What increases the risk? You are more likely to develop this condition if: You have a urinary catheter that stays in place. You are not able to control when you urinate or have a bowel movement (incontinence). You are female and you: Use a spermicide or diaphragm for birth control. Have low estrogen levels. Are pregnant. You have certain genes that increase your risk. You are sexually active. You take antibiotic medicines. You have a condition that causes your flow of urine to slow down, such as: An enlarged prostate, if you are female. Blockage in your urethra. A  kidney stone. A nerve condition that affects your bladder control (neurogenic bladder). Not getting enough to drink, or not urinating often. You have certain medical conditions, such as: Diabetes. A weak disease-fighting system (immunesystem). Sickle cell disease. Gout. Spinal cord injury. What are the signs or symptoms? Symptoms of this condition include: Needing to urinate right away (urgency). Frequent urination. This may include small amounts of urine each time you urinate. Pain or burning with urination. Blood in the urine. Urine that smells bad or unusual. Trouble urinating. Cloudy urine. Vaginal discharge, if you are female. Pain in the abdomen or the lower back. You may also have: Vomiting or a decreased appetite. Confusion. Irritability or tiredness. A fever or chills.  Diarrhea. The first symptom in older adults may be confusion. In some cases, they may not have any symptoms until the infection has worsened. How is this diagnosed? This condition is diagnosed based on your medical history and a physical exam. You may also have other tests, including: Urine tests. Blood tests. Tests for STIs (sexually transmitted infections). If you have had more than one UTI, a cystoscopy or imaging studies may be done to determine the cause of the infections. How is this treated? Treatment for this condition includes: Antibiotic medicine. Over-the-counter medicines to treat discomfort. Drinking enough water to stay hydrated. If you have frequent infections or have other conditions such as a kidney stone, you may need to see a health care provider who specializes in the urinary tract (urologist). In rare cases, urinary tract infections can cause sepsis. Sepsis is a life-threatening condition that occurs when the body responds to an infection. Sepsis is treated in the hospital with IV antibiotics, fluids, and other medicines. Follow these instructions at home: Medicines Take  over-the-counter and prescription medicines only as told by your health care provider. If you were prescribed an antibiotic medicine, take it as told by your health care provider. Do not stop using the antibiotic even if you start to feel better. General instructions Make sure you: Empty your bladder often and completely. Do not hold urine for long periods of time. Empty your bladder after sex. Wipe from front to back after urinating or having a bowel movement if you are female. Use each tissue only one time when you wipe. Drink enough fluid to keep your urine pale yellow. Keep all follow-up visits. This is important. Contact a health care provider if: Your symptoms do not get better after 1-2 days. Your symptoms go away and then return. Get help right away if: You have severe pain in your back or your lower abdomen. You have a fever or chills. You have nausea or vomiting. Summary A urinary tract infection (UTI) is an infection of any part of the urinary tract, which includes the kidneys, ureters, bladder, and urethra. Most urinary tract infections are caused by bacteria in your genital area. Treatment for this condition often includes antibiotic medicines. If you were prescribed an antibiotic medicine, take it as told by your health care provider. Do not stop using the antibiotic even if you start to feel better. Keep all follow-up visits. This is important. This information is not intended to replace advice given to you by your health care provider. Make sure you discuss any questions you have with your health care provider. Document Revised: 06/27/2020 Document Reviewed: 06/27/2020 Elsevier Patient Education  2022 [employer].    If you have been instructed to have an in-person evaluation today at a local Urgent Care facility, please use the link below. It will take you to a list of all of our available [facility], including address, phone number and hours of operation.  Please do not delay care.  [facility]  If you or a family member do not have a primary care provider, use the link below to schedule a visit and establish care. When you choose a [facility] primary care physician or advanced practice provider, you gain a long-term partner in health. Find a Primary Care Provider  Learn more about [facility]'s in-office and virtual care options: [facility]

## 2021-11-20 NOTE — Progress Notes (Signed)
Virtual Visit Consent   Yvonne [name], you are scheduled for a virtual visit with a [facility] provider today.     Just as with appointments in the office, your consent must be obtained to participate.  Your consent will be active for this visit and any virtual visit you may have with one of our providers in the next 365 days.     If you have a MyChart account, a copy of this consent can be sent to you electronically.  All virtual visits are billed to your insurance company just like a traditional visit in the office.    As this is a virtual visit, video technology does not allow for your provider to perform a traditional examination.  This may limit your provider's ability to fully assess your condition.  If your provider identifies any concerns that need to be evaluated in person or the need to arrange testing (such as labs, EKG, etc.), we will make arrangements to do so.     Although advances in technology are sophisticated, we cannot ensure that it will always work on either your end or our end.  If the connection with a video visit is poor, the visit may have to be switched to a telephone visit.  With either a video or telephone visit, we are not always able to ensure that we have a secure connection.     I need to obtain your verbal consent now.   Are you willing to proceed with your visit today?    Yvonne [name] has provided verbal consent on 11/20/2021 for a virtual visit (video or telephone).   Mar Daring, PA-C   Date: 11/20/2021 2:59 PM   Virtual Visit via Video Note   I, Mar Daring, connected with  Yvonne [name]  ([id number], 07/07/1966) on 11/20/21 at  3:00 PM EST by a video-enabled telemedicine application and verified that I am speaking with the correct person using two identifiers.  Location: Patient: Virtual Visit Location Patient: Home Provider: Virtual Visit Location Provider: Office/Clinic   I discussed the limitations of evaluation and  management by telemedicine and the availability of in person appointments. The patient expressed understanding and agreed to proceed.    History of Present Illness: Yvonne [name] is a 55 y.o. who identifies as a female who was assigned female at birth, and is being seen today for suspected UTI.  HPI: Urinary Tract Infection  This is a new problem. The current episode started in the past 7 days. The problem has been gradually worsening. The quality of the pain is described as aching and burning. The pain is mild. There has been no fever. Associated symptoms include a discharge, frequency, hesitancy, nausea, urgency and vomiting. Pertinent negatives include no chills, flank pain or hematuria. She has tried increased fluids for the symptoms. The treatment provided no relief.   Currently on Zpack for bacterial bronchitis following flu. This is causing fecal incontinence that has led to UTI symptoms.   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  COVID-19 12/19/2020   Cervical radicular pain 07/10/2020   Cervical facet joint syndrome 07/10/2020   Lumbar spondylosis 07/10/2020   Chronic pain syndrome 07/10/2020   Atherosclerosis of aorta ([place]) 03/25/2020   Arthralgia 03/19/2020   Otitis media 01/11/2020   Restless leg 01/01/2020   Weight loss counseling, encounter for 10/28/2019   Hair loss 10/28/2019   Hyperglycemia 10/28/2019   Morbid obesity with body mass index (BMI) of 40.0 or higher ([place]) 04/29/2019   Migraines 03/29/2019   Primary osteoarthritis of both knees 01/26/2019   Screen for  colon cancer 01/19/2019   Encounter for long-term (current) use of high-risk medication 01/19/2019   Non-seasonal allergic rhinitis 01/19/2019   Rheumatoid factor positive 01/17/2019   Prediabetes 12/15/2018   Chronic pain of both knees 11/17/2018   Right wrist pain 11/17/2018   GERD (gastroesophageal reflux disease) 03/31/2018   Skin lesion 03/01/2018   Hayfever 02/08/2018   Chronic nonintractable headache 02/08/2018   Hypertension 02/08/2018   Menopausal hot flushes 02/08/2018    Allergies:  Allergies  Allergen Reactions   Dried Figs [Ficus] Anaphylaxis    Tongue tingling & felt like throat was swelling.     Flonase [Fluticasone Propionate]     Increases eye pressure    Sulfa Antibiotics Swelling   Tussionex Pennkinetic Er [Hydrocod Polst-Cpm Polst Er] Itching    Hyperactive    Medications:  Current Outpatient Medications:    fluconazole (DIFLUCAN) 150 MG tablet, Take 1 tablet (150 mg total) by mouth once for 1 dose. May repeat in 72 hours if needed, Disp: 2 tablet, Rfl: 0   nitrofurantoin, macrocrystal-monohydrate, (MACROBID) 100 MG capsule, Take 1 capsule (100 mg total) by mouth 2 (two) times daily., Disp: 10 capsule, Rfl: 0   acetaminophen (TYLENOL) 650 MG CR tablet, Take 650 mg by mouth every 8 (eight) hours as needed for pain., Disp: , Rfl:    albuterol (VENTOLIN HFA) 108 (90 Base) MCG/ACT inhaler, Inhale 2 puffs into the lungs every 6 (six) hours as needed for wheezing or shortness of breath., Disp: 8 g, Rfl: 0   amLODipine (NORVASC) 5 MG tablet, Take 1 tablet (5 mg total) by mouth daily., Disp: 90 tablet, Rfl: 3   azithromycin (ZITHROMAX) 250 MG tablet, Take 500 mg once, then 250 mg for four days, Disp: 6 tablet, Rfl: 0   baclofen (LIORESAL) 10 MG tablet, Take 1 tablet (10 mg total) by mouth 2 (two) times daily., Disp: 90 tablet, Rfl: 0   benzonatate (TESSALON PERLES) 100 MG capsule, Take 1 capsule (100 mg total) by mouth 3 (three) times daily as needed for cough., Disp: 30  capsule, Rfl: 0   clindamycin (CLINDAGEL) 1 % gel, Apply topically 2 (two) times daily., Disp: 30 g, Rfl: 4   DULoxetine (CYMBALTA) 60 MG capsule, Take 1 capsule (60 mg total) by mouth 2 (two) times daily., Disp: 180 capsule, Rfl: 1   fexofenadine (ALLEGRA ALLERGY) 180 MG tablet, Take 1 tablet (180 mg total) by mouth daily., Disp: 90 tablet, Rfl: 1   gabapentin (NEURONTIN) 100 MG capsule, Take 2 capsules (200 mg total) by mouth 3 (three) times daily., Disp: 180 capsule, Rfl: 0   hydroxychloroquine (PLAQUENIL) 200 MG tablet, Take 200 mg by mouth 2 (two) times daily., Disp: , Rfl:    ipratropium-albuterol (DUONEB) 0.5-2.5 (3) MG/3ML SOLN, Take 3 mLs by nebulization every 4 (four) hours as needed., Disp: 360 mL, Rfl: 0   latanoprost (XALATAN) 0.005 % ophthalmic solution, Place 1 drop into both eyes at bedtime.,  Disp: , Rfl:    Melatonin 5 MG CAPS, Take by mouth., Disp: , Rfl:    montelukast (SINGULAIR) 10 MG tablet, Take 1 tablet (10 mg total) by mouth at bedtime., Disp: 90 tablet, Rfl: 0   nortriptyline (PAMELOR) 10 MG capsule, Take 1 capsule (10 mg total) by mouth at bedtime., Disp: 90 capsule, Rfl: 0   omeprazole (PRILOSEC) 20 MG capsule, Take 1 capsule (20 mg total) by mouth daily., Disp: 90 capsule, Rfl: 3   ondansetron (ZOFRAN ODT) 8 MG disintegrating tablet, Take 1 tablet (8 mg total) by mouth 2 (two) times daily as needed for nausea or vomiting (migraine)., Disp: 30 tablet, Rfl: 1   ondansetron (ZOFRAN-ODT) 4 MG disintegrating tablet, Take 1 tablet (4 mg total) by mouth every 8 (eight) hours as needed for nausea or vomiting., Disp: 20 tablet, Rfl: 0   promethazine-dextromethorphan (PROMETHAZINE-DM) 6.25-15 MG/5ML syrup, Take 5 mLs by mouth 4 (four) times daily as needed for cough., Disp: 118 mL, Rfl: 0   propranolol (INDERAL) 40 MG tablet, Take 1 tablet (40 mg total) by mouth 2 (two) times daily., Disp: 180 tablet, Rfl: 1   RINVOQ 15 MG TB24, Take 1 tablet by mouth daily., Disp: , Rfl:     SUMAtriptan (IMITREX) 50 MG tablet, TAKE 1 TABLET BY MOUTH ONCE FOR 1 DOSE. MAY REPEAT IN 2 HOURS IF HEADACHE PERSISTS OR RECURS, Disp: 30 tablet, Rfl: 1   timolol (TIMOPTIC) 0.25 % ophthalmic solution, Place 1 drop into both eyes daily., Disp: , Rfl:    XIIDRA 5 % SOLN, Place 1 drop into both eyes., Disp: , Rfl:   Observations/Objective: Patient is well-developed, well-nourished in no acute distress.  Resting comfortably at home.  Head is normocephalic, atraumatic.  No labored breathing.  Speech is clear and coherent with logical content.  Patient is alert and oriented at baseline.    Assessment and Plan: 1. Suspected UTI - nitrofurantoin, macrocrystal-monohydrate, (MACROBID) 100 MG capsule; Take 1 capsule (100 mg total) by mouth 2 (two) times daily.  Dispense: 10 capsule; Refill: 0  2. Antibiotic-induced yeast infection - fluconazole (DIFLUCAN) 150 MG tablet; Take 1 tablet (150 mg total) by mouth once for 1 dose. May repeat in 72 hours if needed  Dispense: 2 tablet; Refill: 0  - Worsening symptoms.  - Will treat empirically with Macrobid - Gets yeast infections with antibiotics. Diflucan given  - Continue to push fluids.  - She is to call if symptoms do not improve or if they worsen.    Follow Up Instructions: I discussed the assessment and treatment plan with the patient. The patient was provided an opportunity to ask questions and all were answered. The patient agreed with the plan and demonstrated an understanding of the instructions.  A copy of instructions were sent to the patient via MyChart unless otherwise noted below.   Patient has requested to receive PHI (AVS, Work Notes, etc) pertaining to this video visit through e-mail as they are currently without active [facility]. They have voiced understand that email is not considered secure and their health information could be viewed by someone other than the patient.   The patient was advised to call back or seek an in-person  evaluation if the symptoms worsen or if the condition fails to improve as anticipated.  Time:  I spent 10 minutes with the patient via telehealth technology discussing the above problems/concerns.    Mar Daring, PA-C

## 2021-11-20 NOTE — Telephone Encounter (Signed)
Noted She declines US liver and US thyroid  Appt with Dr Melvyn Novas 12/22/21

## 2021-12-17 ENCOUNTER — Ambulatory Visit: Payer: Managed Care, Other (non HMO) | Admitting: Dermatology

## 2021-12-18 ENCOUNTER — Other Ambulatory Visit: Payer: Self-pay

## 2021-12-18 ENCOUNTER — Encounter: Payer: Self-pay | Admitting: Family

## 2021-12-18 DIAGNOSIS — G43809 Other migraine, not intractable, without status migrainosus: Secondary | ICD-10-CM

## 2021-12-18 DIAGNOSIS — M62838 Other muscle spasm: Secondary | ICD-10-CM

## 2021-12-18 DIAGNOSIS — G2581 Restless legs syndrome: Secondary | ICD-10-CM

## 2021-12-18 MED ORDER — GABAPENTIN 100 MG PO CAPS: 200.0000 mg | ORAL_CAPSULE | Freq: Three times a day (TID) | ORAL | 1 refills | Status: DC

## 2021-12-18 MED ORDER — ONDANSETRON 8 MG PO TBDP: 8.0000 mg | ORAL_TABLET | Freq: Two times a day (BID) | ORAL | 1 refills | Status: DC | PRN

## 2021-12-18 MED ORDER — BACLOFEN 10 MG PO TABS: 10.0000 mg | ORAL_TABLET | Freq: Two times a day (BID) | ORAL | 0 refills | Status: DC

## 2021-12-18 MED ORDER — SUMATRIPTAN SUCCINATE 50 MG PO TABS: ORAL_TABLET | ORAL | 1 refills | Status: DC

## 2021-12-22 ENCOUNTER — Other Ambulatory Visit: Payer: Self-pay

## 2021-12-22 ENCOUNTER — Encounter: Payer: Self-pay | Admitting: Internal Medicine

## 2021-12-22 ENCOUNTER — Ambulatory Visit
Admission: RE | Admit: 2021-12-22 | Discharge: 2021-12-22 | Disposition: A | Discharge status: Home / Self Care | Payer: Managed Care, Other (non HMO) | Attending: Internal Medicine | Admitting: Internal Medicine

## 2021-12-22 ENCOUNTER — Ambulatory Visit: Payer: Managed Care, Other (non HMO) | Admitting: Internal Medicine

## 2021-12-22 ENCOUNTER — Ambulatory Visit
Admission: RE | Admit: 2021-12-22 | Discharge: 2021-12-22 | Disposition: A | Discharge status: Home / Self Care | Payer: Managed Care, Other (non HMO) | Source: Ambulatory Visit | Attending: Internal Medicine | Admitting: Internal Medicine

## 2021-12-22 DIAGNOSIS — J40 Bronchitis, not specified as acute or chronic: Secondary | ICD-10-CM

## 2021-12-22 MED ORDER — METHYLPREDNISOLONE ACETATE 80 MG/ML IJ SUSP: 80.0000 mg | Freq: Once | INTRAMUSCULAR | Status: DC

## 2021-12-22 MED ORDER — BISOPROLOL FUMARATE 5 MG PO TABS: 5.0000 mg | ORAL_TABLET | Freq: Every day | ORAL | 11 refills | Status: DC

## 2021-12-22 MED ORDER — METHYLPREDNISOLONE ACETATE 80 MG/ML IJ SUSP
80.0000 mg | Freq: Once | INTRAMUSCULAR | Status: AC
  Administered 2021-12-22: 10:00:00 80 mg via INTRAMUSCULAR

## 2021-12-22 NOTE — Assessment & Plan Note (Signed)
Onset with Influenza A first week in Dec 2022 - trial off inderol 12/22/2021 >>>  - max rx for gerd 12/22/2021 >>>  - walked mod pace 12/22/2021 x 175 ft sob and chest tightness with sats  98%   DDX of  difficult airways management almost all start with A and  include Adherence, Ace Inhibitors, Acid Reflux, Active Sinus Disease, Alpha 1 Antitripsin deficiency, Anxiety masquerading as Airways dz,  ABPA,  Allergy(esp in young), Aspiration (esp in elderly), Adverse effects of meds,  Active smoking or vaping, A bunch of PE's (a small clot burden can't cause this syndrome unless there is already severe underlying pulm or vascular dz with poor reserve) plus two Bs  = Bronchiectasis and Beta blocker use..and one C= CHF   Adherence is always the initial "prime suspect" and is a multilayered concern that requires a "trust but verify" approach in every patient - starting with knowing how to use medications, especially inhalers, correctly, keeping up with refills and understanding the fundamental difference between maintenance and prns vs those medications only taken for a very short course and then stopped and not refilled.  - rec change to neb prn to simplify saba prn and optimize delivery - return in 4 weeks with all meds in hand using a trust but verify approach to confirm accurate Medication  Reconciliation The principal here is that until we are certain that the  patients are doing what we've asked, it makes no sense to ask them to do more.   ? Acid (or non-acid) GERD > always difficult to exclude as up to 75% of pts in some series report no assoc GI/ Heartburn symptoms and she also has assoc atypical cp/ hoarseness > rec max (24h)  acid suppression and diet restrictions/ reviewed and instructions given in writing.   ? Allergy component > depomedrol 80 mg IM   ? Beta blocker effects (on timolol and inderol) > see hbp  F/u in 6 weeks with pfts

## 2021-12-22 NOTE — Patient Instructions (Addendum)
Pantoprazole (protonix) 40 mg   Take  30-60 min before first meal of the day and Pepcid (famotidine)  20 mg after supper until return to office - this is the best way to tell whether stomach acid is contributing to your problem.    GERD (REFLUX)  is an extremely common cause of respiratory symptoms just like yours , many times with no obvious heartburn at all.    It can be treated with medication, but also with lifestyle changes including elevation of the head of your bed (ideally with 6 -8inch blocks under the headboard of your bed),  Smoking cessation, avoidance of late meals, excessive alcohol, and avoid fatty foods, chocolate, peppermint, colas, red wine, and acidic juices such as orange juice.  NO MINT OR MENTHOL PRODUCTS SO NO COUGH DROPS  USE SUGARLESS CANDY INSTEAD (Jolley ranchers or Stover's or Life Savers) or even ice chips will also do - the key is to swallow to prevent all throat clearing. NO OIL BASED VITAMINS - use powdered substitutes.  Avoid fish oil when coughing.   Use the cough syrup as much as you need to eliminate the cough   Stop the propranolol and start bisoprolol 5 mg daily   Depomedrol 120 mg IM  today   For cough/ wheeze/ short of breath >>>  nebulizer up to every 4 hours as needed   We will walk you today to get an idea about your exercise tolerance   Please remember to go to the  x-ray department  for your tests - we will call you with the results when they are available    Please schedule a follow up office visit in 6 weeks, call sooner if needed with PFTs on return

## 2021-12-22 NOTE — Assessment & Plan Note (Signed)
Body mass index is 45.14 kg/m.  -   Lab Results  Component Value Date   TSH 1.400 01/24/2020      Contributing to doe and risk of GERD >>>   reviewed the need and the process to achieve and maintain neg calorie balance > defer f/u primary care including intermittently monitoring thyroid status   Each maintenance medication was reviewed in detail including emphasizing most importantly the difference between maintenance and prns and under what circumstances the prns are to be triggered using an action plan format where appropriate.  Total time for H and P, chart review, counseling, reviewing hfa/neb device(s) , directly observing portions of ambulatory 02 saturation study/ and generating customized AVS unique to this office visit / same day charting  > 45 min

## 2021-12-22 NOTE — Progress Notes (Signed)
Yvonne GAUTHREAUX, female    DOB: 03-04-1966  MRN: [id number]   Brief patient profile:  56   yowf  quit smoking 20001  problems with sinus infections at least twice a year all her life tendency to bronchitis by age 56 some better p quit smoking referred to pulmonary clinic in [facility]  12/22/2021 by Dr Vidal Schwalbe  for eval  of persistent cough/ sob since early Dec 2022  seen in ER dx influenza A 11/10/21.     History of Present Illness  12/22/2021  Pulmonary/ 1st office eval/ Lajuan Kovaleski / [place] no maint rx/ on inderal /  Chief Complaint  Patient presents with   pulmonary consult    Per Mable Paris, NP--flu/PNA 10/2021. C/o sob with exertion and non prod cough.   Dyspnea:  less than 50 ft to mb can do s stopping but more difficult  than it was prior to pna Cough: dry hacking worse with strong smells  Sleep:   difficult lying flat so has to sleep on side = baseline x years  SABA use: neb helps more than hfa  No obvious day to day or daytime variability or assoc excess/ purulent sputum or mucus plugs or hemoptysis or  subjective wheeze or overt sinus or hb symptoms.    Also denies any obvious fluctuation of symptoms with weather or environmental changes or other aggravating or alleviating factors except as outlined above   No unusual exposure hx or h/o childhood pna/ asthma or knowledge of premature birth.  Current Allergies, Complete Past Medical History, Past Surgical History, Family History, and Social History were reviewed in [employer] record.  ROS  The following are not active complaints unless bolded Hoarseness, sore throat, dysphagia, dental problems, itching, sneezing,  nasal congestion or discharge of excess mucus or purulent secretions, ear ache,   fever, chills, sweats, unintended wt loss or wt gain, classically pleuritic or exertional cp,  orthopnea pnd or arm/hand swelling  or leg swelling, presyncope, palpitations, abdominal pain, anorexia,  nausea, vomiting, diarrhea  or change in bowel habits or change in bladder habits, change in stools or change in urine, dysuria, hematuria,  rash, arthralgias, visual complaints, headache, numbness, weakness or ataxia or problems with walking or coordination,  change in mood or  memory.           Past Medical History:  Diagnosis Date   Allergy    Arthritis, rheumatoid (HCC)    Degenerative disc disease, cervical    GERD (gastroesophageal reflux disease)    Gestational diabetes    patient denies   Glaucoma    H/O bronchitis    Headache    migraine   History of ovarian cyst    Hypertension    pre-eclampsia with first child   Increased pressure in the eye, bilateral    PONV (postoperative nausea and vomiting)    Restless leg    Shingles 2021    Outpatient Medications Prior to Visit -  - NOTE:   Unable to verify as accurately reflecting what pt takes    Medication Sig Dispense Refill   acetaminophen (TYLENOL) 650 MG CR tablet Take 650 mg by mouth every 8 (eight) hours as needed for pain.     Albuterol Sulfate (VENTOLIN HFA IN) Inhale into the lungs.     amLODipine (NORVASC) 5 MG tablet Take 1 tablet (5 mg total) by mouth daily. 90 tablet 3   baclofen (LIORESAL) 10 MG tablet Take 1 tablet (10 mg total)  by mouth 2 (two) times daily. 90 tablet 0   benzonatate (TESSALON PERLES) 100 MG capsule Take 1 capsule (100 mg total) by mouth 3 (three) times daily as needed for cough. 30 capsule 0   clindamycin (CLINDAGEL) 1 % gel Apply topically 2 (two) times daily. 30 g 4   DULoxetine (CYMBALTA) 60 MG capsule Take 1 capsule (60 mg total) by mouth 2 (two) times daily. 180 capsule 1   fexofenadine (ALLEGRA ALLERGY) 180 MG tablet Take 1 tablet (180 mg total) by mouth daily. 90 tablet 1   gabapentin (NEURONTIN) 100 MG capsule Take 2 capsules (200 mg total) by mouth 3 (three) times daily. 540 capsule 1   hydroxychloroquine (PLAQUENIL) 200 MG tablet Take 200 mg by mouth 2 (two) times daily.      latanoprost (XALATAN) 0.005 % ophthalmic solution Place 1 drop into both eyes at bedtime.     Melatonin 5 MG CAPS Take by mouth.     montelukast (SINGULAIR) 10 MG tablet Take 1 tablet (10 mg total) by mouth at bedtime. 90 tablet 0   nortriptyline (PAMELOR) 10 MG capsule Take 1 capsule (10 mg total) by mouth at bedtime. 90 capsule 0   omeprazole (PRILOSEC) 20 MG capsule Take 1 capsule (20 mg total) by mouth daily. 90 capsule 3   ondansetron (ZOFRAN ODT) 8 MG disintegrating tablet Take 1 tablet (8 mg total) by mouth 2 (two) times daily as needed for nausea or vomiting (migraine). 30 tablet 1   ondansetron (ZOFRAN-ODT) 4 MG disintegrating tablet Take 1 tablet (4 mg total) by mouth every 8 (eight) hours as needed for nausea or vomiting. 20 tablet 0   promethazine-dextromethorphan (PROMETHAZINE-DM) 6.25-15 MG/5ML syrup Take 5 mLs by mouth 4 (four) times daily as needed for cough. 118 mL 0   propranolol (INDERAL) 40 MG tablet Take 1 tablet (40 mg total) by mouth 2 (two) times daily. 180 tablet 1   RINVOQ 15 MG TB24 Take 1 tablet by mouth daily.     SUMAtriptan (IMITREX) 50 MG tablet TAKE 1 TABLET BY MOUTH ONCE FOR 1 DOSE. MAY REPEAT IN 2 HOURS IF HEADACHE PERSISTS OR RECURS 30 tablet 1   timolol (TIMOPTIC) 0.25 % ophthalmic solution Place 1 drop into both eyes daily.     XIIDRA 5 % SOLN Place 1 drop into both eyes.     albuterol (VENTOLIN HFA) 108 (90 Base) MCG/ACT inhaler Inhale 2 puffs into the lungs every 6 (six) hours as needed for wheezing or shortness of breath. 8 g 0          ipratropium-albuterol (DUONEB) 0.5-2.5 (3) MG/3ML SOLN Take 3 mLs by nebulization every 4 (four) hours as needed. 360 mL 0       0       Objective:     BP 126/74 (BP Location: Left Arm, Cuff Size: Large)    Pulse 71    Temp 97.8 F (36.6 C) (Temporal)    Ht 5\' 2"  (1.575 m)    Wt 246 lb 12.8 oz (111.9 kg)    LMP 05/06/2017 (Exact Date) Comment: BSO   SpO2 97%    BMI 45.14 kg/m   SpO2: 97 % RA  Amb hoarse obese wf  nad   HEENT : pt wearing mask not removed for exam due to covid -19 concerns.    NECK :  without JVD/Nodes/TM/ nl carotid upstrokes bilaterally   LUNGS: no acc muscle use,  Nl contour chest which is clear to A and P bilaterally  without cough on insp or exp maneuvers   CV:  RRR  no s3 or murmur or increase in P2, and no edema   ABD:  quite soft and nontender with limited nspiratory excursion in the supine position. No bruits or organomegaly appreciated, bowel sounds nl  MS:  Nl gait/ ext warm without deformities, calf tenderness, cyanosis or clubbing No obvious joint restrictions   SKIN: warm and dry without lesions    NEURO:  alert, approp, nl sensorium with  no motor or cerebellar deficits apparent.    CXR PA and Lateral:   12/22/2021 :    I personally reviewed images and impression is as follows:     Decreased lung vol, mild T kyphosis     Assessment   No problem-specific Assessment & Plan notes found for this encounter.     Christinia Gully, MD 12/22/2021

## 2022-01-05 ENCOUNTER — Telehealth: Payer: Self-pay

## 2022-01-05 NOTE — Telephone Encounter (Signed)
Patient called and needed a appointment scheduled so she is scheduled now for 03/18/2022

## 2022-01-06 ENCOUNTER — Telehealth: Payer: Managed Care, Other (non HMO) | Admitting: Nurse Practitioner

## 2022-01-06 DIAGNOSIS — N3000 Acute cystitis without hematuria: Secondary | ICD-10-CM

## 2022-01-06 MED ORDER — CEPHALEXIN 500 MG PO CAPS: 500.0000 mg | ORAL_CAPSULE | Freq: Four times a day (QID) | ORAL | 0 refills | Status: DC

## 2022-01-06 NOTE — Patient Instructions (Signed)

## 2022-01-06 NOTE — Progress Notes (Signed)
Virtual Visit Consent   Yvonne [name], you are scheduled for a virtual visit with Yvonne Hassell Done, FNP, a [facility] provider, today.     Just as with appointments in the office, your consent must be obtained to participate.  Your consent will be active for this visit and any virtual visit you may have with one of our providers in the next 365 days.     If you have a MyChart account, a copy of this consent can be sent to you electronically.  All virtual visits are billed to your insurance company just like a traditional visit in the office.    As this is a virtual visit, video technology does not allow for your provider to perform a traditional examination.  This may limit your provider's ability to fully assess your condition.  If your provider identifies any concerns that need to be evaluated in person or the need to arrange testing (such as labs, EKG, etc.), we will make arrangements to do so.     Although advances in technology are sophisticated, we cannot ensure that it will always work on either your end or our end.  If the connection with a video visit is poor, the visit may have to be switched to a telephone visit.  With either a video or telephone visit, we are not always able to ensure that we have a secure connection.     I need to obtain your verbal consent now.   Are you willing to proceed with your visit today? YES   Yvonne [name] has provided verbal consent on 01/06/2022 for a virtual visit (video or telephone).   Yvonne Hassell Done, FNP   Date: 01/06/2022 8:06 AM   Virtual Visit via Video Note   I, Yvonne [name], connected with Yvonne [name] ([id number], 08-02-1966) on 01/06/22 at  8:15 AM EST by a video-enabled telemedicine application and verified that I am speaking with the correct person using two identifiers.  Location: Patient: Virtual Visit Location Patient: Home Provider: Virtual Visit Location Provider: Mobile   I discussed the limitations of  evaluation and management by telemedicine and the availability of in person appointments. The patient expressed understanding and agreed to proceed.    History of Present Illness: Yvonne [name] is a 56 y.o. who identifies as a female who was assigned female at birth, and is being seen today for UTI.  HPI: Patient c/o dysuira that started on Sunday evening. She stasted on AZO which seemed to help some with symptoms. Had frequency and urgency.   Review of Systems  Genitourinary:  Positive for dysuria, frequency and urgency.   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   COVID-19 12/19/2020   Cervical radicular pain 07/10/2020   Cervical facet joint syndrome 07/10/2020   Lumbar spondylosis 07/10/2020   Chronic pain syndrome 07/10/2020   Atherosclerosis of aorta ([place]) 03/25/2020   Arthralgia 03/19/2020   Otitis media 01/11/2020   Restless leg 01/01/2020   Weight loss counseling, encounter for 10/28/2019   Hair loss 10/28/2019  Hyperglycemia 10/28/2019   Morbid obesity with body mass index (BMI) of 40.0 or higher ([place]) 04/29/2019   Migraines 03/29/2019   Primary osteoarthritis of both knees 01/26/2019   Screen for colon cancer 01/19/2019   Encounter for long-term (current) use of high-risk medication 01/19/2019   Non-seasonal allergic rhinitis 01/19/2019   Rheumatoid factor positive 01/17/2019   Prediabetes 12/15/2018   Chronic pain of both knees 11/17/2018   Right wrist pain 11/17/2018   GERD (gastroesophageal reflux disease) 03/31/2018   Skin lesion 03/01/2018   Hayfever  02/08/2018   Chronic nonintractable headache 02/08/2018   Hypertension 02/08/2018   Menopausal hot flushes 02/08/2018    Allergies:  Allergies  Allergen Reactions   Dried Figs [Ficus] Anaphylaxis    Tongue tingling & felt like throat was swelling.     Flonase [Fluticasone Propionate]     Increases eye pressure    Sulfa Antibiotics Swelling   Tussionex Pennkinetic Er [Hydrocod Poli-Chlorphe Poli Er] Itching    Hyperactive    Medications:  Current Outpatient Medications:    acetaminophen (TYLENOL) 650 MG CR tablet, Take 650 mg by mouth every 8 (eight) hours as needed for pain., Disp: , Rfl:    Albuterol Sulfate (VENTOLIN HFA IN), Inhale into the lungs., Disp: , Rfl:    amLODipine (NORVASC) 5 MG tablet, Take 1 tablet (5 mg total) by mouth daily., Disp: 90 tablet, Rfl: 3   baclofen (LIORESAL) 10 MG tablet, Take 1 tablet (10 mg total) by mouth 2 (two) times daily., Disp: 90 tablet, Rfl: 0   bisoprolol (ZEBETA) 5 MG tablet, Take 1 tablet (5 mg total) by mouth daily., Disp: 30 tablet, Rfl: 11   clindamycin (CLINDAGEL) 1 % gel, Apply topically 2 (two) times daily., Disp: 30 g, Rfl: 4   DULoxetine (CYMBALTA) 60 MG capsule, Take 1 capsule (60 mg total) by mouth 2 (two) times daily., Disp: 180 capsule, Rfl: 1   fexofenadine (ALLEGRA ALLERGY) 180 MG tablet, Take 1 tablet (180 mg total) by mouth daily., Disp: 90 tablet, Rfl: 1   gabapentin (NEURONTIN) 100 MG capsule, Take 2 capsules (200 mg total) by mouth 3 (three) times daily., Disp: 540 capsule, Rfl: 1   hydroxychloroquine (PLAQUENIL) 200 MG tablet, Take 200 mg by mouth 2 (two) times daily., Disp: , Rfl:    latanoprost (XALATAN) 0.005 % ophthalmic solution, Place 1 drop into both eyes at bedtime., Disp: , Rfl:    Melatonin 5 MG CAPS, Take by mouth., Disp: , Rfl:    montelukast (SINGULAIR) 10 MG tablet, Take 1 tablet (10 mg total) by mouth at bedtime., Disp: 90 tablet, Rfl: 0   nortriptyline (PAMELOR) 10 MG capsule, Take 1 capsule (10 mg total)  by mouth at bedtime., Disp: 90 capsule, Rfl: 0   ondansetron (ZOFRAN ODT) 8 MG disintegrating tablet, Take 1 tablet (8 mg total) by mouth 2 (two) times daily as needed for nausea or vomiting (migraine)., Disp: 30 tablet, Rfl: 1   ondansetron (ZOFRAN-ODT) 4 MG disintegrating tablet, Take 1 tablet (4 mg total) by mouth every 8 (eight) hours as needed for nausea or vomiting., Disp: 20 tablet, Rfl: 0   promethazine-dextromethorphan (PROMETHAZINE-DM) 6.25-15 MG/5ML syrup, Take 5 mLs by mouth 4 (four) times daily as needed for cough., Disp: 118 mL, Rfl: 0   RINVOQ 15 MG TB24, Take 1 tablet by mouth daily., Disp: , Rfl:    SUMAtriptan (IMITREX) 50 MG tablet, TAKE 1 TABLET BY MOUTH ONCE FOR 1 DOSE. MAY REPEAT IN 2 HOURS IF HEADACHE PERSISTS  OR RECURS, Disp: 30 tablet, Rfl: 1   timolol (TIMOPTIC) 0.25 % ophthalmic solution, Place 1 drop into both eyes daily., Disp: , Rfl:    XIIDRA 5 % SOLN, Place 1 drop into both eyes., Disp: , Rfl:   Observations/Objective: Patient is well-developed, well-nourished in no acute distress.  Resting comfortably  at home.  Head is normocephalic, atraumatic.  No labored breathing.  Speech is clear and coherent with logical content.  Patient is alert and oriented at baseline.    Assessment and Plan:  [name] in today with chief complaint of No chief complaint on file.   1. Acute cystitis without hematuria Take medication as prescribe Cotton underwear Take shower not bath Cranberry juice, yogurt Force fluids AZO over the counter X2 days RTO prn  Meds ordered this encounter  Medications   cephALEXin (KEFLEX) 500 MG capsule    Sig: Take 1 capsule (500 mg total) by mouth 4 (four) times daily.    Dispense:  14 capsule    Refill:  0    Order Specific Question:   Supervising Provider    Answer:   Noemi Chapel [[id number]]      Follow Up Instructions: I discussed the assessment and treatment plan with the patient. The patient was provided an opportunity to  ask questions and all were answered. The patient agreed with the plan and demonstrated an understanding of the instructions.  A copy of instructions were sent to the patient via MyChart.  The patient was advised to call back or seek an in-person evaluation if the symptoms worsen or if the condition fails to improve as anticipated.  Time:  I spent 8 minutes with the patient via telehealth technology discussing the above problems/concerns.    Yvonne Hassell Done, FNP

## 2022-01-08 ENCOUNTER — Encounter: Payer: Self-pay | Admitting: Internal Medicine

## 2022-01-08 NOTE — Telephone Encounter (Signed)
Dr. Wert, please advise. Thanks!  

## 2022-01-13 ENCOUNTER — Other Ambulatory Visit: Payer: Self-pay

## 2022-01-13 ENCOUNTER — Encounter: Payer: Self-pay | Admitting: Family

## 2022-01-13 DIAGNOSIS — G2581 Restless legs syndrome: Secondary | ICD-10-CM

## 2022-01-13 MED ORDER — GABAPENTIN 100 MG PO CAPS: 200.0000 mg | ORAL_CAPSULE | Freq: Four times a day (QID) | ORAL | 1 refills | Status: DC

## 2022-01-18 ENCOUNTER — Encounter: Payer: Self-pay | Admitting: Family

## 2022-01-22 ENCOUNTER — Other Ambulatory Visit: Payer: Self-pay

## 2022-01-22 ENCOUNTER — Encounter: Payer: Self-pay | Admitting: Family

## 2022-01-22 ENCOUNTER — Ambulatory Visit: Payer: Managed Care, Other (non HMO) | Admitting: Family

## 2022-01-22 VITALS — BP 122/70 | HR 61 | Temp 98.4°F | Ht 62.0 in | Wt 249.4 lb

## 2022-01-22 DIAGNOSIS — E041 Nontoxic single thyroid nodule: Secondary | ICD-10-CM

## 2022-01-22 DIAGNOSIS — G894 Chronic pain syndrome: Secondary | ICD-10-CM | POA: Diagnosis not present

## 2022-01-22 DIAGNOSIS — K760 Fatty (change of) liver, not elsewhere classified: Secondary | ICD-10-CM | POA: Diagnosis not present

## 2022-01-22 NOTE — Patient Instructions (Addendum)
After thyroid ultrasound , we can re-discuss wegovy ( weight loss medication).  As discussed, this medication carries a black box warning for thyroid cancer.  I have ordered ultrasound of liver and thyroid  planning for same day Let us know if you dont hear back within a week in regards to an appointment being scheduled.    If we start wegovy, below is more information about medication  We have discussed starting non insulin daily injectable medication called Wegovy  which is a glucagon like peptide (GLP 1) agonist and works by delaying gastric emptying and increasing insulin secretion.It is given once per week. Most patients see significant weight loss with this drug class.   You may NOT take either medication if you or your family has history of thyroid, parathyroid, OR adrenal cancer. Please confirm you and your family does NOT have this history as this drug class has black box warning on this medication for that reason.   Advise to follow with directions on prescription and slowly increase from 0.25mg  [place] once per week ;stay here for 4 weeks. You may then increase to 0.5mg  [place] once per week and stay there for 4 weeks.  We can slowly titrate further at follow up with goal of no more than 1-2 lbs weight loss per week.  Semaglutide Injection (Weight Management) What is this medication? SEMAGLUTIDE (SEM a GLOO tide) promotes weight loss. It may also be used to maintain weight loss. It works by decreasing appetite. Changes to diet and exercise are often combined with this medication. This medicine may be used for other purposes; ask your health care provider or pharmacist if you have questions. COMMON BRAND NAME(S): [id number] What should I tell my care team before I take this medication? They need to know if you have any of these conditions: Endocrine tumors (MEN 2) or if someone in your family had these tumors Eye disease, vision problems Gallbladder disease History of depression or mental health  disease History of pancreatitis Kidney disease Stomach or intestine problems Suicidal thoughts, plans, or attempt; a previous suicide attempt by you or a family member Thyroid cancer or if someone in your family had thyroid cancer An unusual or allergic reaction to semaglutide, other medications, foods, dyes, or preservatives Pregnant or trying to get pregnant Breast-feeding How should I use this medication? This medication is injected under the skin. You will be taught how to prepare and give it. Take it as directed on the prescription label. It is given once every week (every 7 days). Keep taking it unless your care team tells you to stop. It is important that you put your used needles and pens in a special sharps container. Do not put them in a trash can. If you do not have a sharps container, call your pharmacist or care team to get one. A special MedGuide will be given to you by the pharmacist with each prescription and refill. Be sure to read this information carefully each time. This medication comes with INSTRUCTIONS FOR USE. Ask your pharmacist for directions on how to use this medication. Read the information carefully. Talk to your pharmacist or care team if you have questions. Talk to your care team about the use of this medication in children. Special care may be needed. Overdosage: If you think you have taken too much of this medicine contact a poison control center or emergency room at once. NOTE: This medicine is only for you. Do not share this medicine with others. What if I  miss a dose? If you miss a dose and the next scheduled dose is more than 2 days away, take the missed dose as soon as possible. If you miss a dose and the next scheduled dose is less than 2 days away, do not take the missed dose. Take the next dose at your regular time. Do not take double or extra doses. If you miss your dose for 2 weeks or more, take the next dose at your regular time or call your care team to  talk about how to restart this medication. What may interact with this medication? Insulin and other medications for diabetes This list may not describe all possible interactions. Give your health care provider a list of all the medicines, herbs, non-prescription drugs, or dietary supplements you use. Also tell them if you smoke, drink alcohol, or use illegal drugs. Some items may interact with your medicine. What should I watch for while using this medication? Visit your care team for regular checks on your progress. It may be some time before you see the benefit from this medication. Drink plenty of fluids while taking this medication. Check with your care team if you have severe diarrhea, nausea, and vomiting, or if you sweat a lot. The loss of too much body fluid may make it dangerous for you to take this medication. This medication may affect blood sugar levels. Ask your care team if changes in diet or medications are needed if you have diabetes. If you or your family notice any changes in your behavior, such as new or worsening depression, thoughts of harming yourself, anxiety, other unusual or disturbing thoughts, or memory loss, call your care team right away. Women should inform their care team if they wish to become pregnant or think they might be pregnant. Losing weight while pregnant is not advised and may cause harm to the unborn child. Talk to your care team for more information. What side effects may I notice from receiving this medication? Side effects that you should report to your care team as soon as possible: Allergic reactions--skin rash, itching, hives, swelling of the face, lips, tongue, or throat Change in vision Dehydration--increased thirst, dry mouth, feeling faint or lightheaded, headache, dark yellow or brown urine Gallbladder problems--severe stomach pain, nausea, vomiting, fever Heart palpitations--rapid, pounding, or irregular heartbeat Kidney injury--decrease in the  amount of urine, swelling of the ankles, hands, or feet Pancreatitis--severe stomach pain that spreads to your back or gets worse after eating or when touched, fever, nausea, vomiting Thoughts of suicide or self-harm, worsening mood, feelings of depression Thyroid cancer--new mass or lump in the neck, pain or trouble swallowing, trouble breathing, hoarseness Side effects that usually do not require medical attention (report to your care team if they continue or are bothersome): Diarrhea Loss of appetite Nausea Stomach pain Vomiting This list may not describe all possible side effects. Call your doctor for medical advice about side effects. You may report side effects to FDA at 1-800-FDA-1088. Where should I keep my medication? Keep out of the reach of children and pets. Refrigeration (preferred): Store in the refrigerator. Do not freeze. Keep this medication in the original container until you are ready to take it. Get rid of any unused medication after the expiration date. Room temperature: If needed, prior to cap removal, the pen can be stored at room temperature for up to 28 days. Protect from light. If it is stored at room temperature, get rid of any unused medication after 28 days or  after it expires, whichever is first. It is important to get rid of the medication as soon as you no longer need it or it is expired. You can do this in two ways: Take the medication to a medication take-back program. Check with your pharmacy or law enforcement to find a location. If you cannot return the medication, follow the directions in the [place]. NOTE: This sheet is a summary. It may not cover all possible information. If you have questions about this medicine, talk to your doctor, pharmacist, or health care provider.  2022 Elsevier/Gold Standard (2021-02-20 00:00:00)

## 2022-01-22 NOTE — Progress Notes (Signed)
Subjective:    Patient ID: Yvonne [name], female    DOB: Oct 19, 1966, 56 y.o.   MRN: [id number]  CC: Yvonne [name] is a 56 y.o. female who presents today for follow up.   HPI: She is interested in losing weight .   Chronic pain-overall feels well controlled with medications prescribed. Today she is feeling muscle spasm in her low back.  compliant with Cymbalta 60 mg BID, tramadol 50mg  usually half tablet, mobic 7.5mg , baclofen 10mg .  Tylenol arthritis was not helpful for her.  She uses tramadol very rarely.    CT angiogram chest 11/10/2021 without evidence of pulmonary embolism.  Right lobe thyroid gland with subtle nodularity.  Liver demonstrates diffuse steatosis  She has no personal or family history of thyroid cancer HISTORY:  Past Medical History:  Diagnosis Date   Allergy    Arthritis, rheumatoid (HCC)    Degenerative disc disease, cervical    GERD (gastroesophageal reflux disease)    Gestational diabetes    patient denies   Glaucoma    H/O bronchitis    Headache    migraine   History of ovarian cyst    Hypertension    pre-eclampsia with first child   Increased pressure in the eye, bilateral    PONV (postoperative nausea and vomiting)    Restless leg    Shingles 2021   Past Surgical History:  Procedure Laterality Date   CERVICAL POLYPECTOMY N/A 04/11/2017   Procedure: CERVICAL POLYPECTOMY;  Surgeon: Rubie Maid, MD;  Location: [facility];  Service: Gynecology;  Laterality: N/A;   COLONOSCOPY WITH PROPOFOL N/A 03/24/2021   Procedure: COLONOSCOPY WITH BIOPSY;  Surgeon: Virgel Manifold, MD;  Location: [place];  Service: Endoscopy;  Laterality: N/A;   COLONOSCOPY WITH PROPOFOL N/A 09/18/2021   Procedure: COLONOSCOPY WITH PROPOFOL;  Surgeon: Virgel Manifold, MD;  Location: [facility];  Service: Endoscopy;  Laterality: N/A;   ESOPHAGOGASTRODUODENOSCOPY N/A 09/18/2021   Procedure: ESOPHAGOGASTRODUODENOSCOPY (EGD);  Surgeon: Virgel Manifold, MD;  Location: [facility];  Service: Endoscopy;  Laterality: N/A;   HYSTEROSCOPY WITH D & C N/A 04/11/2017   Procedure: DILATATION AND CURETTAGE /HYSTEROSCOPY;  Surgeon: Rubie Maid, MD;  Location: [facility];  Service: Gynecology;  Laterality: N/A;   IUD REMOVAL N/A 05/05/2017   Procedure: INTRAUTERINE DEVICE (IUD) REMOVAL;  Surgeon: Rubie Maid, MD;  Location: [facility];  Service: Gynecology;  Laterality: N/A;   LAPAROSCOPIC SALPINGO OOPHERECTOMY Bilateral 05/05/2017   Procedure: LAPAROSCOPIC BILATERAL SALPINGO OOPHORECTOMY;  Surgeon: Rubie Maid, MD;  Location: [facility];  Service: Gynecology;  Laterality: Bilateral;   LAPAROSCOPY N/A 04/11/2017   Procedure: LAPAROSCOPY DIAGNOSTIC;  Surgeon: Rubie Maid, MD;  Location: [facility];  Service: Gynecology;  Laterality: N/A;   PARTIAL HYSTERECTOMY     Ovaries and Tubes   POLYPECTOMY N/A 03/24/2021   Procedure: POLYPECTOMY;  Surgeon: Virgel Manifold, MD;  Location: [place];  Service: Endoscopy;  Laterality: N/A;   TUBAL LIGATION     Family History  Problem Relation Age of Onset   Schizophrenia Mother    Bipolar disorder Mother    Alcohol abuse Father    Hypertension Father    Hypertension Brother    Breast cancer Brother    Hypertension Maternal Grandmother    Seizures Paternal Grandmother    Diabetes Paternal Grandmother    Thyroid cancer Neg Hx     Allergies: Dried figs [ficus], Flonase [fluticasone propionate], Sulfa antibiotics, and Tussionex pennkinetic er [hydrocod poli-chlorphe poli er] Current  Outpatient Medications on File Prior to Visit  Medication Sig Dispense Refill   acetaminophen (TYLENOL) 650 MG CR tablet Take 650 mg by mouth every 8 (eight) hours as needed for pain.     Albuterol Sulfate (VENTOLIN HFA IN) Inhale into the lungs.     amLODipine (NORVASC) 5 MG tablet Take 1 tablet (5 mg total) by mouth daily. 90 tablet 3   baclofen (LIORESAL) 10 MG tablet Take 1 tablet (10 mg total) by mouth 2 (two)  times daily. 90 tablet 0   clindamycin (CLINDAGEL) 1 % gel Apply topically 2 (two) times daily. 30 g 4   DULoxetine (CYMBALTA) 60 MG capsule Take 1 capsule (60 mg total) by mouth 2 (two) times daily. 180 capsule 1   fexofenadine (ALLEGRA ALLERGY) 180 MG tablet Take 1 tablet (180 mg total) by mouth daily. 90 tablet 1   gabapentin (NEURONTIN) 100 MG capsule Take 2 capsules (200 mg total) by mouth 4 (four) times daily. 720 capsule 1   hydroxychloroquine (PLAQUENIL) 200 MG tablet Take 200 mg by mouth 2 (two) times daily.     latanoprost (XALATAN) 0.005 % ophthalmic solution Place 1 drop into both eyes at bedtime.     Melatonin 5 MG CAPS Take by mouth.     meloxicam (MOBIC) 7.5 MG tablet Take 7.5 mg by mouth daily.     montelukast (SINGULAIR) 10 MG tablet Take 1 tablet (10 mg total) by mouth at bedtime. 90 tablet 0   nortriptyline (PAMELOR) 10 MG capsule Take 1 capsule (10 mg total) by mouth at bedtime. 90 capsule 0   ondansetron (ZOFRAN ODT) 8 MG disintegrating tablet Take 1 tablet (8 mg total) by mouth 2 (two) times daily as needed for nausea or vomiting (migraine). 30 tablet 1   promethazine-dextromethorphan (PROMETHAZINE-DM) 6.25-15 MG/5ML syrup Take 5 mLs by mouth 4 (four) times daily as needed for cough. 118 mL 0   propranolol (INDERAL) 40 MG tablet Take 40 mg by mouth 2 (two) times daily.     RINVOQ 15 MG TB24 Take 1 tablet by mouth daily.     SUMAtriptan (IMITREX) 50 MG tablet TAKE 1 TABLET BY MOUTH ONCE FOR 1 DOSE. MAY REPEAT IN 2 HOURS IF HEADACHE PERSISTS OR RECURS 30 tablet 1   timolol (TIMOPTIC) 0.25 % ophthalmic solution Place 1 drop into both eyes daily.     traMADol (ULTRAM) 50 MG tablet Take 50 mg by mouth every 8 (eight) hours as needed.     XIIDRA 5 % SOLN Place 1 drop into both eyes.     No current facility-administered medications on file prior to visit.    Social History   Tobacco Use   Smoking status: Former    Packs/day: 1.00    Years: 6.00    Pack years: 6.00     Types: Cigarettes    Quit date: 05/29/2000    Years since quitting: 21.6   Smokeless tobacco: Never   Tobacco comments:    1/2-1 PPD  Vaping Use   Vaping Use: Never used  Substance Use Topics   Alcohol use: No    Alcohol/week: 0.0 standard drinks   Drug use: No    Review of Systems  Constitutional:  Negative for chills and fever.  Respiratory:  Negative for cough.   Cardiovascular:  Negative for chest pain and palpitations.  Gastrointestinal:  Negative for nausea and vomiting.     Objective:    BP 122/70 (BP Location: Left Arm, Patient Position: Sitting, Cuff Size: Large)  Pulse 61    Temp 98.4 F (36.9 C) (Oral)    Ht 5\' 2"  (1.575 m)    Wt 249 lb 6.4 oz (113.1 kg)    LMP 05/06/2017 (Exact Date) Comment: BSO   SpO2 97%    BMI 45.62 kg/m  BP Readings from Last 3 Encounters:  01/22/22 122/70  12/22/21 126/74  11/10/21 (!) 146/70   Wt Readings from Last 3 Encounters:  01/22/22 249 lb 6.4 oz (113.1 kg)  12/22/21 246 lb 12.8 oz (111.9 kg)  11/10/21 244 lb (110.7 kg)    Physical Exam Vitals reviewed.  Constitutional:      Appearance: She is well-developed.  Eyes:     Conjunctiva/sclera: Conjunctivae normal.  Neck:     Thyroid: No thyroid mass, thyromegaly or thyroid tenderness.  Cardiovascular:     Rate and Rhythm: Normal rate and regular rhythm.     Pulses: Normal pulses.     Heart sounds: Normal heart sounds.  Pulmonary:     Effort: Pulmonary effort is normal.     Breath sounds: Normal breath sounds. No wheezing, rhonchi or rales.  Skin:    General: Skin is warm and dry.  Neurological:     Mental Status: She is alert.  Psychiatric:        Speech: Speech normal.        Behavior: Behavior normal.        Thought Content: Thought content normal.       Assessment & Plan:   Problem List Items Addressed This Visit       Endocrine   Thyroid nodule - Primary    Incidentally seen on CT angio chest 11/10/2021.  Pending dedicated ultrasound of thyroid to further  evaluate.  We did discuss at length that she is interested in [facility] for weight loss.  She has no known personal or family history of thyroid cancer.  We agreed to await thyroid ultrasound to be sure no thyroid nodules which warrant surveillance or biopsy prior to starting [facility].  I did however counsel patient on black box warning per FDA as it relates to medullary thyroid cancer, multiple endocrine neoplasia      Relevant Medications   propranolol (INDERAL) 40 MG tablet   Other Relevant Orders   US THYROID     Other   Chronic pain syndrome    Chronic overall stable. Continue Cymbalta 60 mg BID, tramadol 50mg  prn QD, mobic 7.5mg , baclofen 10mg .  Long discussion as it relates to tramadol and I advised her I prescribed this medication and intended for her to use it appropriately.  Advised that she may have days or flares in which she may need to use tramadol 50mg  qd .  Appreciate cautiousness she exhibits with medication and  advised her to continue to have the same cautiousness however we discussed quality of life and the importance of pain management control.  we will continue close follow-up      Relevant Medications   meloxicam (MOBIC) 7.5 MG tablet   traMADol (ULTRAM) 50 MG tablet   Other Visit Diagnoses     Hepatic steatosis       Relevant Orders   US Abdomen Limited RUQ (LIVER/GB)        I have discontinued Kenitha E. Straker's bisoprolol and cephALEXin. I am also having her maintain her latanoprost, hydroxychloroquine, Melatonin, timolol, acetaminophen, Rinvoq, Xiidra, amLODipine, nortriptyline, montelukast, DULoxetine, clindamycin, fexofenadine, promethazine-dextromethorphan, SUMAtriptan, ondansetron, baclofen, Albuterol Sulfate (VENTOLIN HFA IN), gabapentin, meloxicam, propranolol, and traMADol.  No orders of the defined types were placed in this encounter.   Return precautions given.   Risks, benefits, and alternatives of the medications and treatment plan prescribed today  were discussed, and patient expressed understanding.   Education regarding symptom management and diagnosis given to patient on AVS.  Continue to follow with Burnard Hawthorne, FNP for routine health maintenance.   [name] and I agreed with plan.   Mable Paris, FNP

## 2022-01-25 ENCOUNTER — Encounter: Payer: Self-pay | Admitting: Family

## 2022-01-25 DIAGNOSIS — E041 Nontoxic single thyroid nodule: Secondary | ICD-10-CM | POA: Insufficient documentation

## 2022-01-25 NOTE — Assessment & Plan Note (Addendum)
Incidentally seen on CT angio chest 11/10/2021.  Pending dedicated ultrasound of thyroid to further evaluate.  We did discuss at length that she is interested in [facility] for weight loss.  She has no known personal or family history of thyroid cancer.  We agreed to await thyroid ultrasound to be sure no thyroid nodules which warrant surveillance or biopsy prior to starting [facility].  I did however counsel patient on black box warning per FDA as it relates to medullary thyroid cancer, multiple endocrine neoplasia

## 2022-01-25 NOTE — Assessment & Plan Note (Signed)
Chronic overall stable. Continue Cymbalta 60 mg BID, tramadol 50mg  prn QD, mobic 7.5mg , baclofen 10mg .  Long discussion as it relates to tramadol and I advised her I prescribed this medication and intended for her to use it appropriately.  Advised that she may have days or flares in which she may need to use tramadol 50mg  qd .  Appreciate cautiousness she exhibits with medication and  advised her to continue to have the same cautiousness however we discussed quality of life and the importance of pain management control.  we will continue close follow-up

## 2022-01-27 ENCOUNTER — Other Ambulatory Visit: Payer: Self-pay | Admitting: Family

## 2022-01-27 DIAGNOSIS — M62838 Other muscle spasm: Secondary | ICD-10-CM

## 2022-02-01 ENCOUNTER — Ambulatory Visit
Admission: RE | Admit: 2022-02-01 | Discharge: 2022-02-01 | Disposition: A | Discharge status: Home / Self Care | Payer: Managed Care, Other (non HMO) | Source: Ambulatory Visit | Attending: Family | Admitting: Family

## 2022-02-01 ENCOUNTER — Other Ambulatory Visit: Payer: Self-pay

## 2022-02-01 ENCOUNTER — Encounter: Payer: Self-pay | Admitting: Family

## 2022-02-01 DIAGNOSIS — E041 Nontoxic single thyroid nodule: Secondary | ICD-10-CM | POA: Diagnosis present

## 2022-02-01 DIAGNOSIS — K760 Fatty (change of) liver, not elsewhere classified: Secondary | ICD-10-CM | POA: Diagnosis not present

## 2022-02-02 ENCOUNTER — Encounter: Payer: Self-pay | Admitting: Family

## 2022-02-04 ENCOUNTER — Other Ambulatory Visit: Payer: Self-pay

## 2022-02-04 DIAGNOSIS — E041 Nontoxic single thyroid nodule: Secondary | ICD-10-CM

## 2022-02-05 ENCOUNTER — Ambulatory Visit: Payer: Managed Care, Other (non HMO) | Admitting: Internal Medicine

## 2022-02-08 ENCOUNTER — Encounter: Payer: Self-pay | Admitting: Family

## 2022-03-02 ENCOUNTER — Other Ambulatory Visit: Payer: Self-pay

## 2022-03-02 ENCOUNTER — Encounter: Payer: Self-pay | Admitting: Family

## 2022-03-02 DIAGNOSIS — R42 Dizziness and giddiness: Secondary | ICD-10-CM

## 2022-03-02 DIAGNOSIS — M62838 Other muscle spasm: Secondary | ICD-10-CM

## 2022-03-02 DIAGNOSIS — G2581 Restless legs syndrome: Secondary | ICD-10-CM

## 2022-03-02 DIAGNOSIS — J3089 Other allergic rhinitis: Secondary | ICD-10-CM

## 2022-03-02 DIAGNOSIS — I10 Essential (primary) hypertension: Secondary | ICD-10-CM

## 2022-03-02 DIAGNOSIS — G894 Chronic pain syndrome: Secondary | ICD-10-CM

## 2022-03-02 DIAGNOSIS — R519 Headache, unspecified: Secondary | ICD-10-CM

## 2022-03-02 MED ORDER — MONTELUKAST SODIUM 10 MG PO TABS: 10.0000 mg | ORAL_TABLET | Freq: Every day | ORAL | 0 refills | Status: DC

## 2022-03-02 MED ORDER — DULOXETINE HCL 60 MG PO CPEP: 60.0000 mg | ORAL_CAPSULE | Freq: Two times a day (BID) | ORAL | 1 refills | Status: DC

## 2022-03-02 MED ORDER — NORTRIPTYLINE HCL 10 MG PO CAPS: 10.0000 mg | ORAL_CAPSULE | Freq: Every day | ORAL | 0 refills | Status: DC

## 2022-03-02 MED ORDER — FEXOFENADINE HCL 180 MG PO TABS: 180.0000 mg | ORAL_TABLET | Freq: Every day | ORAL | 1 refills | Status: DC

## 2022-03-02 MED ORDER — BACLOFEN 10 MG PO TABS: 10.0000 mg | ORAL_TABLET | Freq: Two times a day (BID) | ORAL | 0 refills | Status: DC

## 2022-03-02 MED ORDER — PROPRANOLOL HCL 40 MG PO TABS: 40.0000 mg | ORAL_TABLET | Freq: Two times a day (BID) | ORAL | 1 refills | Status: DC

## 2022-03-02 MED ORDER — AMLODIPINE BESYLATE 5 MG PO TABS: 5.0000 mg | ORAL_TABLET | Freq: Every day | ORAL | 3 refills | Status: DC

## 2022-03-02 MED ORDER — GABAPENTIN 100 MG PO CAPS: 200.0000 mg | ORAL_CAPSULE | Freq: Four times a day (QID) | ORAL | 1 refills | Status: DC

## 2022-03-03 ENCOUNTER — Telehealth (INDEPENDENT_AMBULATORY_CARE_PROVIDER_SITE_OTHER): Payer: Managed Care, Other (non HMO) | Admitting: Family

## 2022-03-03 DIAGNOSIS — G894 Chronic pain syndrome: Secondary | ICD-10-CM | POA: Diagnosis not present

## 2022-03-03 MED ORDER — TRAMADOL HCL 50 MG PO TABS: 50.0000 mg | ORAL_TABLET | Freq: Two times a day (BID) | ORAL | 2 refills | Status: DC | PRN

## 2022-03-03 NOTE — Progress Notes (Signed)
Verbal consent for services obtained from patient prior to services given to TELEPHONE visit:  ? ?Location of call: ? provider at work ?patient at home ? ?Names of all persons present for services: Mable Paris, NP and patient ?Follow up low back pain ? ?compliant with Cymbalta 60 mg twice daily, tramadol 50 mg qam, meloxicam 7.5 mg, baclofen 10 mg. She has 'felt better mentally and physically' taking the tramadol '50mg'$  which 'wears off by lunch'. '  I did not realize how much pain I was in' ? ?RA- compliant with plaquenil as prescribed by Dr Posey Pronto ? ?Interested in weight loss.  Thyroid nodule-referral to Dr. Harlow Asa whom she sees 03/11/22 ? ?Hepatic stenosis seen right upper quadrant ultrasound. ? ?She had labs including cholesterol done last week.  ? ?A/P/next steps: ? ?Problem List Items Addressed This Visit   ? ?  ? Other  ? Chronic pain syndrome - Primary  ?  Chronic, specifically low back pain has improved.  Agreed that we could increase tramadol 50 mg from once daily to twice daily as medication appears to wear off. she  experienced improved quality of life.  We will increase tramadol slowly, cautiously as she is also on Cymbalta and I counseled her on the risk and side effects of serotonin syndrome.  ? she will complete controlled substance contract when she comes in the office for follow-up in 3 months time.  Continue Cymbalta 60 mg twice daily,meloxicam 7.5 mg, baclofen 10 mg.   I looked up patient on [place] Controlled Substances Reporting System PMP AWARE and saw no activity that raised concern of inappropriate use.  ? ?  ?  ? Relevant Medications  ? traMADol (ULTRAM) 50 MG tablet  ? ? ? ?I spent 15 min  discussing plan of care over the phone.  ? ? ? ? ? ? ? ? ?

## 2022-03-03 NOTE — Assessment & Plan Note (Addendum)
Chronic, specifically low back pain has improved.  Agreed that we could increase tramadol 50 mg from once daily to twice daily as medication appears to wear off. she  experienced improved quality of life.  We will increase tramadol slowly, cautiously as she is also on Cymbalta and I counseled her on the risk and side effects of serotonin syndrome.  ? she will complete controlled substance contract when she comes in the office for follow-up in 3 months time.  Continue Cymbalta 60 mg twice daily,meloxicam 7.5 mg, baclofen 10 mg.   I looked up patient on [place] Controlled Substances Reporting System PMP AWARE and saw no activity that raised concern of inappropriate use.  ? ?

## 2022-03-03 NOTE — Patient Instructions (Signed)
Increase tramadol '50mg'$  to twice daily ? ?I want you to feel better and be able to move more ? ?Let me know how you are doing ? ? ?

## 2022-03-09 ENCOUNTER — Encounter: Payer: Self-pay | Admitting: Family

## 2022-03-10 IMAGING — CR DG CHEST 2V
1 series · 2 of 2 positions shown · non-contrast
Comparison: Chest radiograph dated 11/10/2021.

CLINICAL DATA: Cough.

EXAM:
CHEST - 2 VIEW

[Series 1: dg chest 2 view · 0.14mm/px · 2 of 2 slices shown]
[im 1/2]
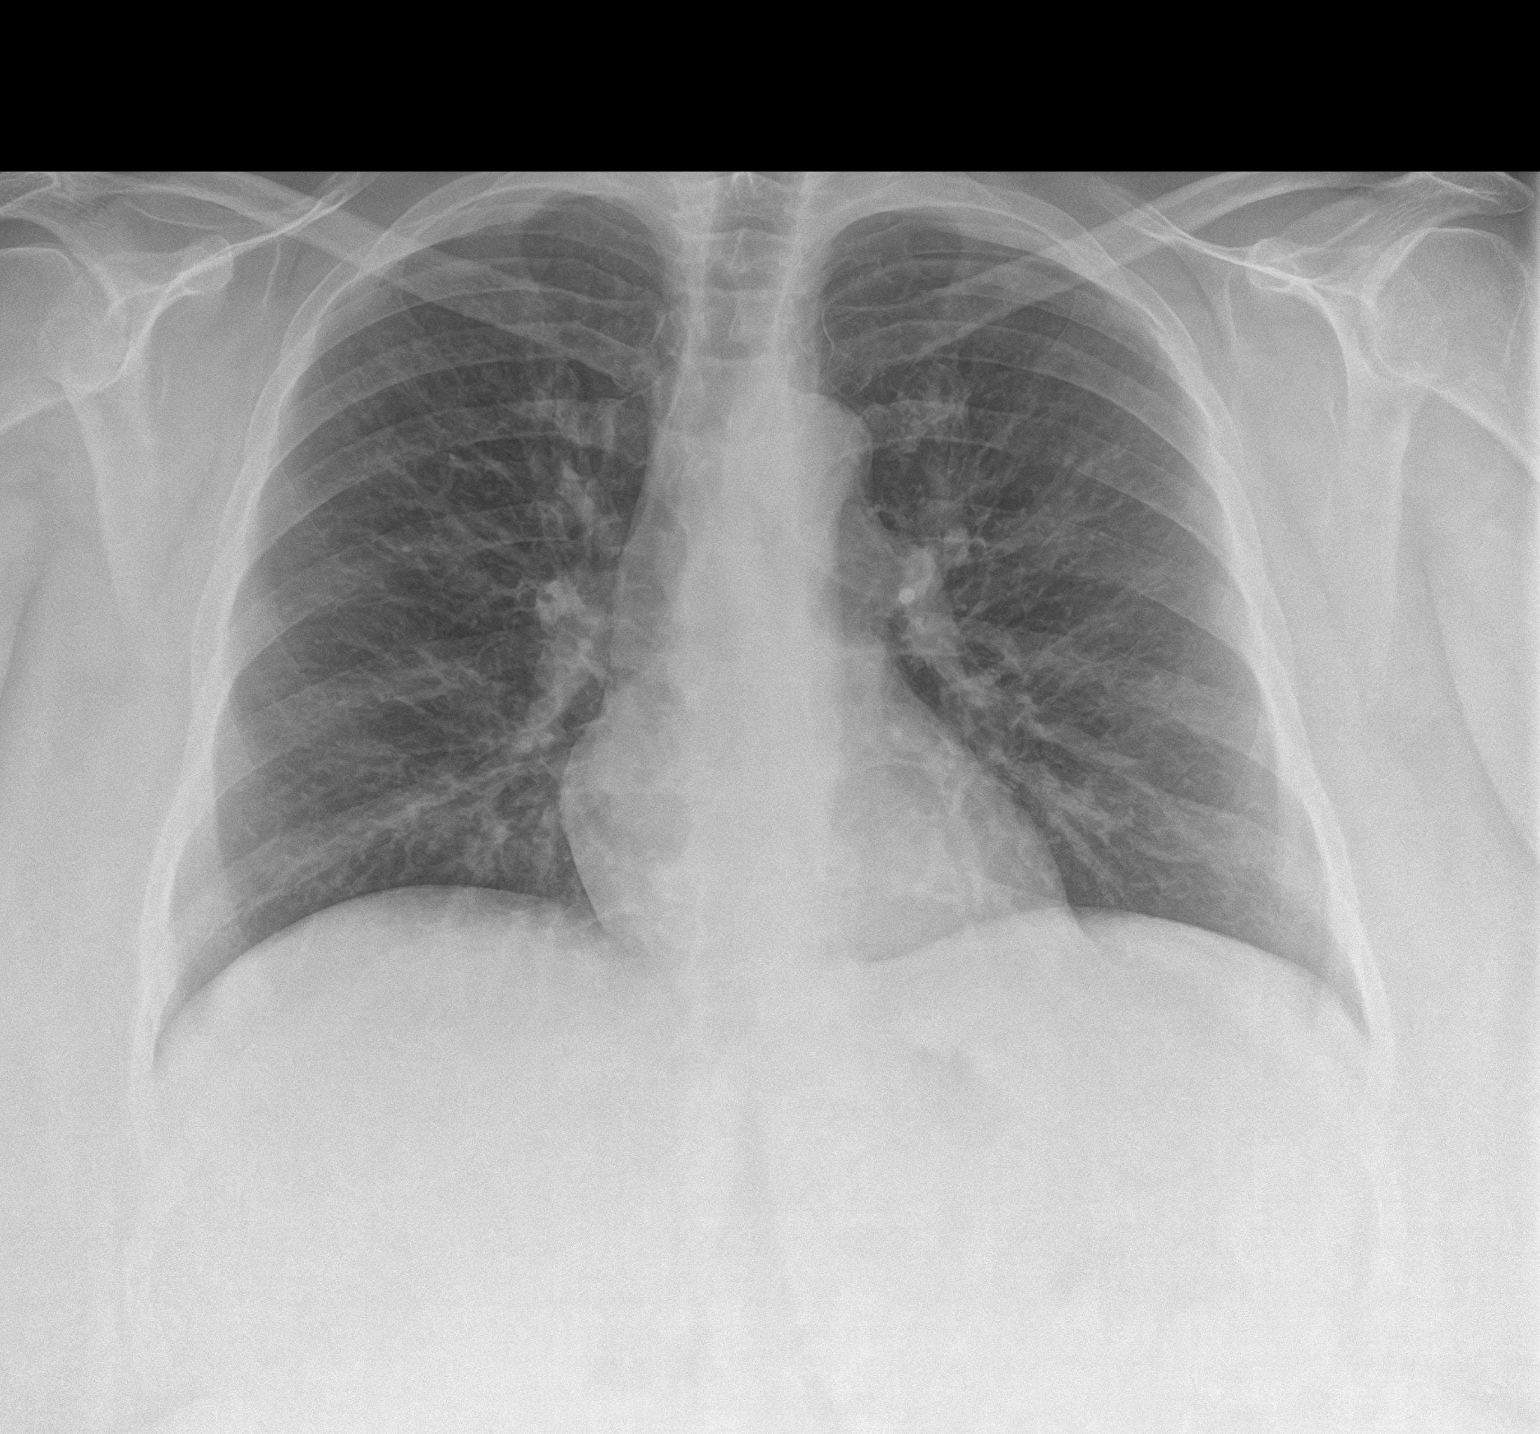
[im 2/2]
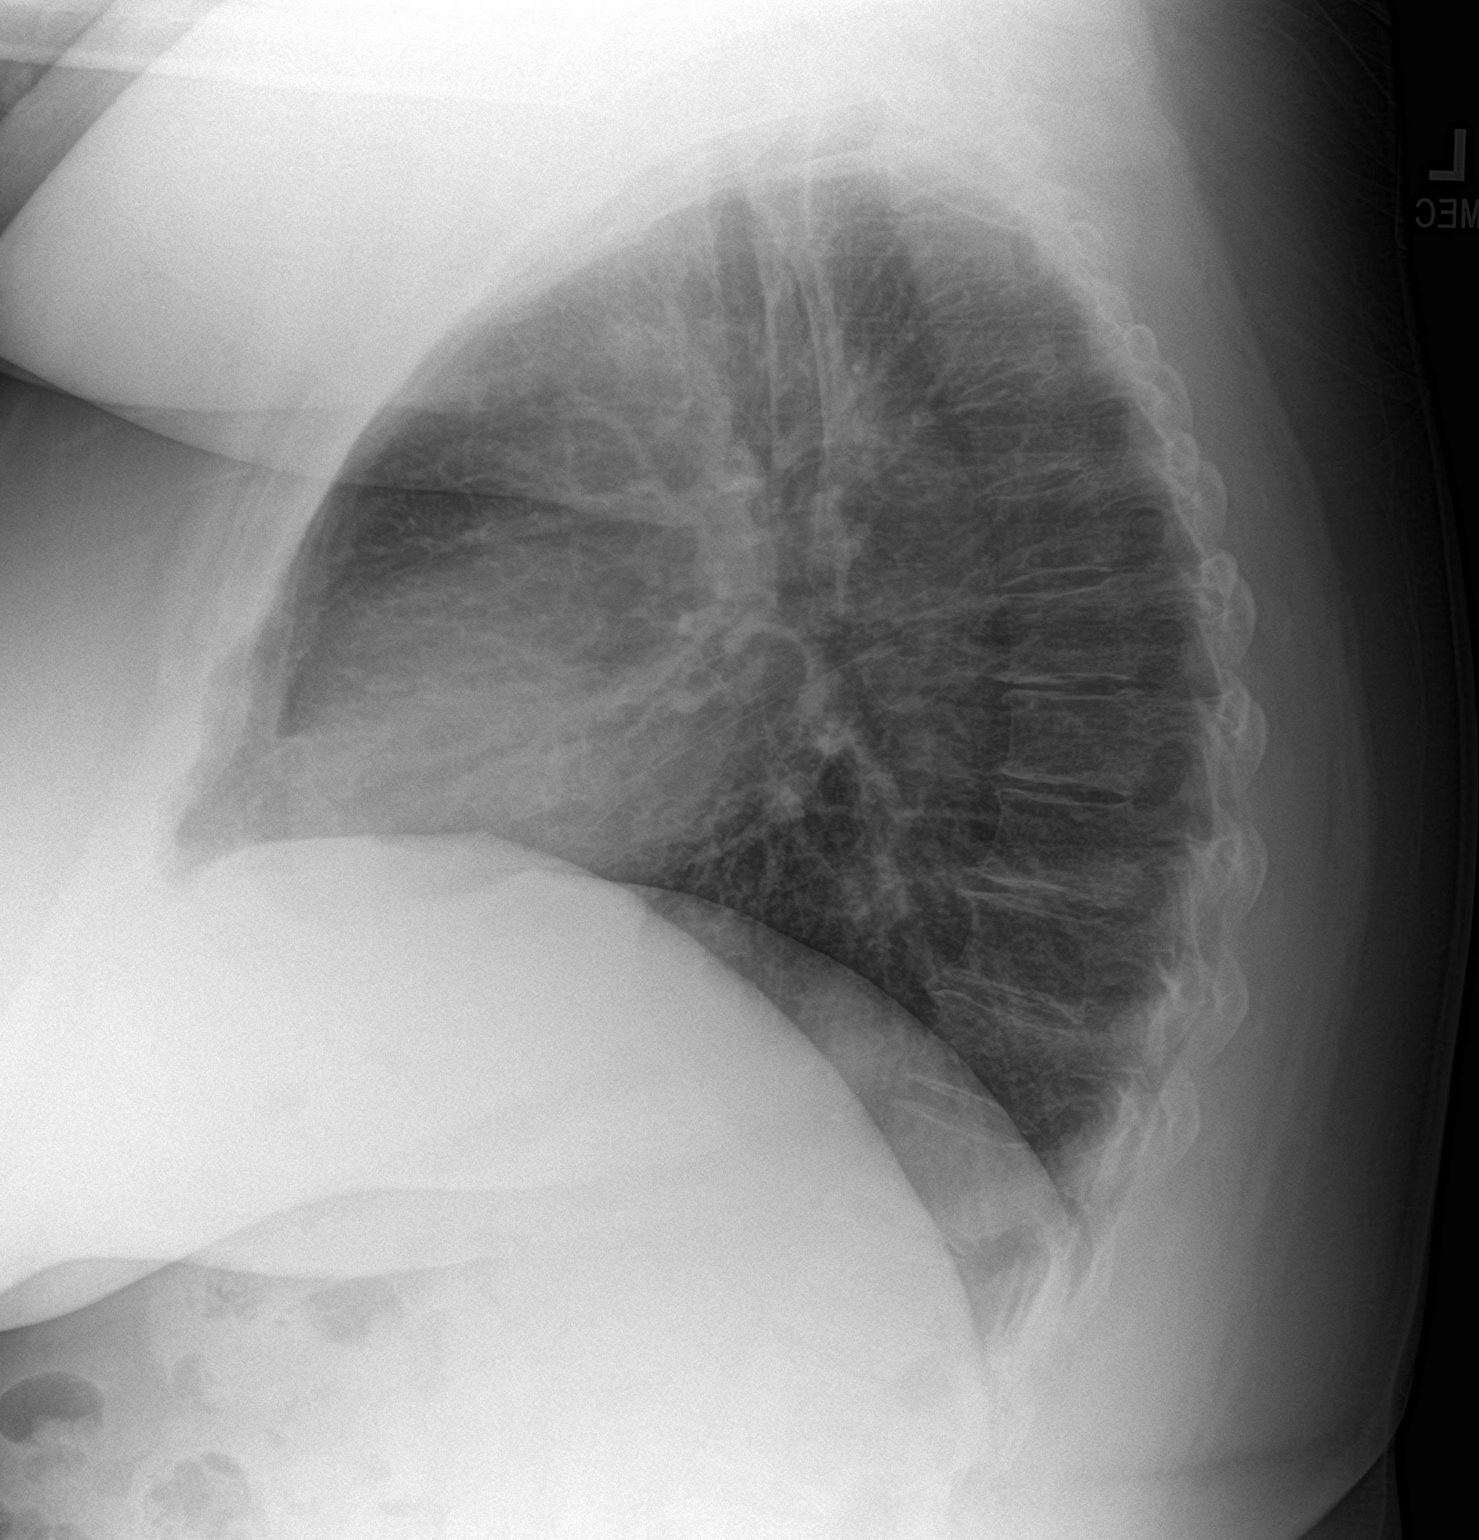

[2 of 2 positions shown; findings below may reference images not displayed]

FINDINGS: Mild chronic interstitial coarsening and bronchitic changes. No
focal consolidation, pleural effusion, or pneumothorax. The cardiac
silhouette is within limits. No acute osseous pathology.
IMPRESSION: No active cardiopulmonary disease.

## 2022-03-14 ENCOUNTER — Other Ambulatory Visit: Payer: Self-pay | Admitting: Family

## 2022-03-17 ENCOUNTER — Other Ambulatory Visit: Payer: Self-pay | Admitting: Surgery

## 2022-03-17 DIAGNOSIS — E041 Nontoxic single thyroid nodule: Secondary | ICD-10-CM

## 2022-03-21 ENCOUNTER — Encounter: Payer: Self-pay | Admitting: Family

## 2022-03-22 ENCOUNTER — Ambulatory Visit: Payer: Managed Care, Other (non HMO) | Admitting: Gastroenterology

## 2022-03-23 ENCOUNTER — Encounter: Payer: Self-pay | Admitting: Family

## 2022-03-24 ENCOUNTER — Telehealth: Payer: Managed Care, Other (non HMO) | Admitting: Physician Assistant

## 2022-03-24 DIAGNOSIS — N3090 Cystitis, unspecified without hematuria: Secondary | ICD-10-CM

## 2022-03-24 MED ORDER — PHENAZOPYRIDINE HCL 200 MG PO TABS: 200.0000 mg | ORAL_TABLET | Freq: Three times a day (TID) | ORAL | 0 refills | Status: AC | PRN

## 2022-03-24 MED ORDER — CEPHALEXIN 500 MG PO CAPS: 500.0000 mg | ORAL_CAPSULE | Freq: Two times a day (BID) | ORAL | 0 refills | Status: AC

## 2022-03-24 NOTE — Progress Notes (Signed)
[name], Yvonne [name] scheduled for a virtual visit with your provider today.   ? ?Just as we do with appointments in the office, we must obtain your consent to participate.  Your consent will be active for this visit and any virtual visit you may have with one of our providers in the next 365 days.   ? ?If you have a MyChart account, I can also send a copy of this consent to you electronically.  All virtual visits [name] billed to your insurance company just like a traditional visit in the office.  As this is a virtual visit, video technology does not allow for your provider to perform a traditional examination.  This may limit your provider's ability to fully assess your condition.  If your provider identifies any concerns that need to be evaluated in person or the need to arrange testing such as labs, EKG, etc, we will make arrangements to do so.   ? ?Although advances in technology [name] sophisticated, we cannot ensure that it will always work on either your end or our end.  If the connection with a video visit is poor, we may have to switch to a telephone visit.  With either a video or telephone visit, we [name] not always able to ensure that we have a secure connection.   I need to obtain your verbal consent now.   [name] you willing to proceed with your visit today?  ? ?Yvonne [name] has provided verbal consent on 03/24/2022 for a virtual visit (video or telephone). ? ? ?Yvonne Forrey Gilman Schmidt, PA-C ?03/24/2022  4:07 PM ? ? ?Date:  03/24/2022  ? ?ID:  Yvonne [name], DOB Feb 19, 1966, MRN [id number] ? ?Patient Location: Other:  work ?Provider Location: Home Office ? ? ?Participants: Patient and Provider for Visit and Wrap up ? ?Method of visit: Video  ?Location of Patient: Home ?Location of Provider: Home Office ?Consent was obtain for visit over the video. ?Services rendered by provider: Visit was performed via video ? ?A video enabled telemedicine application was used and I verified that I am speaking with the correct person  using two identifiers. ? ?PCP:  Yvonne Hawthorne, FNP  ? ?Chief Complaint:  UTI ? ?History of Present Illness:   ? ?Yvonne [name] is a 56 y.o. female with history as stated below. ?Presents video telehealth for an acute care visit ? ?Pt c/o UTI sxs that started yesterday. She reports she has had dysuria, frequency, and urgency since yesterday. Denies hemturia. Has had utis in the past and sxs feel similar. ? ?She denies fevers, chills, flank pain, nv. ? ?Past Medical, Surgical, Social History, Allergies, and Medications have been Reviewed. ? ?Past Medical History:  ?Diagnosis Date  ? Allergy   ? Arthritis, rheumatoid ([place])   ? Degenerative disc disease, cervical   ? GERD (gastroesophageal reflux disease)   ? Gestational diabetes   ? patient denies  ? Glaucoma   ? H/O bronchitis   ? Headache   ? migraine  ? History of ovarian cyst   ? Hypertension   ? pre-eclampsia with first child  ? Increased pressure in the eye, bilateral   ? PONV (postoperative nausea and vomiting)   ? Restless leg   ? Shingles 2021  ? ? ?Current Meds  ?Medication Sig  ? cephALEXin (KEFLEX) 500 MG capsule Take 1 capsule (500 mg total) by mouth 2 (two) times daily for 7 days.  ? phenazopyridine (PYRIDIUM) 200 MG tablet Take 1 tablet (200 mg total)  by mouth 3 (three) times daily as needed for up to 2 days for pain.  ?  ? ?Allergies:   Dried figs [ficus], Flonase [fluticasone propionate], Sulfa antibiotics, and Tussionex pennkinetic er [hydrocod poli-chlorphe poli er]  ? ?ROS ?See HPI for history of present illness. ? ?Physical Exam ?Vitals reviewed.  ?Constitutional:   ?   Appearance: Normal appearance.  ?HENT:  ?   Head: Normocephalic.  ?   Nose: Nose normal.  ?Neurological:  ?   Mental Status: She is alert.  ? ?MDM: Pt with uti sxs. Keflex sent. No sxs to suggest pyelonephritis or systemic involvement. ?          ? ?There [name] no diagnoses linked to this encounter. ? ? ?Time:   ?Today, I have spent 7 minutes with the patient with telehealth  technology discussing the above problems, reviewing the chart, previous notes, medications and orders.  ? ? ?Tests Ordered: ?No orders of the defined types were placed in this encounter. ? ? ?Medication Changes: ?Meds ordered this encounter  ?Medications  ? cephALEXin (KEFLEX) 500 MG capsule  ?  Sig: Take 1 capsule (500 mg total) by mouth 2 (two) times daily for 7 days.  ?  Dispense:  14 capsule  ?  Refill:  0  ?  Order Specific Question:   Supervising Provider  ?  Answer:   Noemi Chapel [[id number]]  ? phenazopyridine (PYRIDIUM) 200 MG tablet  ?  Sig: Take 1 tablet (200 mg total) by mouth 3 (three) times daily as needed for up to 2 days for pain.  ?  Dispense:  6 tablet  ?  Refill:  0  ?  Order Specific Question:   Supervising Provider  ?  Answer:   Noemi Chapel [[id number]]  ? ? ? ?Disposition:  Follow up  ?Signed, ?Yvonne Klosinski Gilman Schmidt, PA-C  ?03/24/2022 4:07 PM    ? ? ?

## 2022-03-24 NOTE — Patient Instructions (Signed)
?[name], thank you for joining Rodney Booze, PA-C for today's virtual visit.  While this provider is not your primary care provider (PCP), if your PCP is located in our provider database this encounter information will be shared with them immediately following your visit. ? ?Consent: ?(Patient) Yvonne [name] provided verbal consent for this virtual visit at the beginning of the encounter. ? ?Current Medications: ? ?Current Outpatient Medications:  ?  acetaminophen (TYLENOL) 650 MG CR tablet, Take 650 mg by mouth every 8 (eight) hours as needed for pain., Disp: , Rfl:  ?  Albuterol Sulfate (VENTOLIN HFA IN), Inhale into the lungs., Disp: , Rfl:  ?  amLODipine (NORVASC) 5 MG tablet, Take 1 tablet (5 mg total) by mouth daily., Disp: 90 tablet, Rfl: 3 ?  baclofen (LIORESAL) 10 MG tablet, Take 1 tablet (10 mg total) by mouth 2 (two) times daily., Disp: 90 tablet, Rfl: 0 ?  clindamycin (CLINDAGEL) 1 % gel, Apply topically 2 (two) times daily., Disp: 30 g, Rfl: 4 ?  DULoxetine (CYMBALTA) 60 MG capsule, Take 1 capsule (60 mg total) by mouth 2 (two) times daily., Disp: 180 capsule, Rfl: 1 ?  fexofenadine (ALLEGRA ALLERGY) 180 MG tablet, Take 1 tablet (180 mg total) by mouth daily., Disp: 90 tablet, Rfl: 1 ?  gabapentin (NEURONTIN) 100 MG capsule, Take 2 capsules (200 mg total) by mouth 4 (four) times daily., Disp: 720 capsule, Rfl: 1 ?  hydroxychloroquine (PLAQUENIL) 200 MG tablet, Take 200 mg by mouth 2 (two) times daily., Disp: , Rfl:  ?  latanoprost (XALATAN) 0.005 % ophthalmic solution, Place 1 drop into both eyes at bedtime., Disp: , Rfl:  ?  Melatonin 5 MG CAPS, Take by mouth., Disp: , Rfl:  ?  meloxicam (MOBIC) 7.5 MG tablet, TAKE 1 TABLET DAILY WITH FOOD AS NEEDED FOR PAIN, Disp: 90 tablet, Rfl: 3 ?  montelukast (SINGULAIR) 10 MG tablet, Take 1 tablet (10 mg total) by mouth at bedtime., Disp: 90 tablet, Rfl: 0 ?  nortriptyline (PAMELOR) 10 MG capsule, Take 1 capsule (10 mg total) by mouth at bedtime.,  Disp: 90 capsule, Rfl: 0 ?  ondansetron (ZOFRAN ODT) 8 MG disintegrating tablet, Take 1 tablet (8 mg total) by mouth 2 (two) times daily as needed for nausea or vomiting (migraine)., Disp: 30 tablet, Rfl: 1 ?  propranolol (INDERAL) 40 MG tablet, Take 1 tablet (40 mg total) by mouth 2 (two) times daily. (Patient not taking: Reported on 03/03/2022), Disp: 180 tablet, Rfl: 1 ?  RINVOQ 15 MG TB24, Take 1 tablet by mouth daily., Disp: , Rfl:  ?  SUMAtriptan (IMITREX) 50 MG tablet, TAKE 1 TABLET BY MOUTH ONCE FOR 1 DOSE. MAY REPEAT IN 2 HOURS IF HEADACHE PERSISTS OR RECURS, Disp: 30 tablet, Rfl: 1 ?  timolol (TIMOPTIC) 0.25 % ophthalmic solution, Place 1 drop into both eyes daily., Disp: , Rfl:  ?  traMADol (ULTRAM) 50 MG tablet, Take 1 tablet (50 mg total) by mouth every 12 (twelve) hours as needed., Disp: 60 tablet, Rfl: 2 ?  XIIDRA 5 % SOLN, Place 1 drop into both eyes., Disp: , Rfl:   ? ?Medications ordered in this encounter:  ?No orders of the defined types were placed in this encounter. ?  ? ?*If you need refills on other medications prior to your next appointment, please contact your pharmacy* ? ?Follow-Up: ?Call back or seek an in-person evaluation if the symptoms worsen or if the condition fails to improve as anticipated. ? ?Other  Instructions ?You were given a prescription for antibiotics. Please take the antibiotic prescription fully.  ? ?Follow up with your regular doctor in 1 week for reassessment and seek care sooner if your symptoms worsen or fail to improve. ? ? ? ?If you have been instructed to have an in-person evaluation today at a local Urgent Care facility, please use the link below. It will take you to a list of all of our available [facility], including address, phone number and hours of operation. Please do not delay care.  ?[facility] ? ?If you or a family member do not have a primary care provider, use the link below to schedule a visit and establish care. When you  choose a [facility] primary care physician or advanced practice provider, you gain a long-term partner in health. ?Find a Primary Care Provider ? ?Learn more about [facility] in-office and virtual care options: ?[facility] ? ?

## 2022-03-25 ENCOUNTER — Ambulatory Visit
Admission: RE | Admit: 2022-03-25 | Discharge: 2022-03-25 | Disposition: A | Discharge status: Home / Self Care | Payer: Managed Care, Other (non HMO) | Source: Ambulatory Visit | Attending: Surgery | Admitting: Surgery

## 2022-03-25 ENCOUNTER — Other Ambulatory Visit (HOSPITAL_COMMUNITY)
Admission: RE | Admit: 2022-03-25 | Discharge: 2022-03-25 | Disposition: A | Discharge status: Home / Self Care | Payer: Managed Care, Other (non HMO) | Source: Ambulatory Visit | Attending: Surgery | Admitting: Surgery

## 2022-03-25 DIAGNOSIS — E041 Nontoxic single thyroid nodule: Secondary | ICD-10-CM | POA: Insufficient documentation

## 2022-03-26 ENCOUNTER — Other Ambulatory Visit: Payer: Managed Care, Other (non HMO)

## 2022-03-29 LAB — CYTOLOGY - NON PAP

## 2022-03-30 NOTE — Progress Notes (Signed)
Good news!  The thyroid biopsy is benign.  We will plan to see you back in one year with a repeat USN and a TSH level. ? ?Claiborne Billings - please arrange for these studies and then a follow up appointment with me in one year. ? ?tmg ? ?Armandina Gemma, MD ?[facility] ?A [facility] practice ?Office: [phone] ?

## 2022-04-12 ENCOUNTER — Encounter: Payer: Self-pay | Admitting: Gastroenterology

## 2022-04-12 ENCOUNTER — Ambulatory Visit: Payer: Managed Care, Other (non HMO) | Admitting: Gastroenterology

## 2022-04-12 ENCOUNTER — Other Ambulatory Visit: Payer: Self-pay

## 2022-04-12 VITALS — BP 99/64 | HR 66 | Temp 97.8°F | Ht 62.0 in | Wt 252.2 lb

## 2022-04-12 DIAGNOSIS — K582 Mixed irritable bowel syndrome: Secondary | ICD-10-CM

## 2022-04-12 NOTE — Progress Notes (Signed)
?  ?Cephas Darby, MD ?[address]  ?[address]  ?[place], [place] [address]  ?Main: [phone]  ?Fax: [phone] ? ? ? ?Gastroenterology Consultation ? ?Referring Provider:     Burnard Hawthorne, FNP ?Primary Care Physician:  Burnard Hawthorne, FNP ?Primary Gastroenterologist:  Dr. Cephas Darby ?Reason for Consultation: Constipation alternating with diarrhea ?      ? HPI:   ?Yvonne [name] is a 56 y.o. female referred by Dr. Burnard Hawthorne, FNP  for consultation & management of constipation alternating with diarrhea.  Patient has chronic symptoms of severe constipation followed by spurts of diarrhea resulting in urgency and sometimes soiling.  She also has abdominal bloating.  Patient is frustrated with ongoing symptoms.  She underwent upper endoscopy and colonoscopy, colonoscopy revealed mild active colitis in the cecum with no evidence of chronicity.  Patient reports that she tried MiraLAX twice daily in the past which did not help.  She has been gaining weight, although patient reports that she has cut back on carbonated beverages, sweet tea.  She does consume hamburger about twice a week.  She also states that she is trying to cut back on fried foods as well.  Eats baked chicken. ?No evidence of anemia, TSH normal ? ?NSAIDs: None ? ?Antiplts/Anticoagulants/Anti thrombotics: None ? ?GI Procedures:  ?EGD and colonoscopy 09/18/2021 ?DIAGNOSIS:  ?A.  DUODENUM; COLD BIOPSY:  ?- DUODENAL MUCOSA WITH INTACT VILLI.  ?- NEGATIVE FOR ACTIVE INFLAMMATION, INTRAEPITHELIAL LYMPHOCYTOSIS, AND  ?INFECTIOUS AGENTS.  ?- NO HISTOLOGIC EVIDENCE OF CELIAC DISEASE.  ? ?B.  STOMACH ERYTHEMA; COLD BIOPSY:  ?- MILD REACTIVE GASTROPATHY OF ANTRAL AND OXYNTIC MUCOSA.  ?- OXYNTIC MUCOSA WITH PROTON PUMP INHIBITOR EFFECT.  ?- NEGATIVE FOR ACTIVE INFLAMMATION, H. PYLORI, INTESTINAL METAPLASIA,  ?DYSPLASIA, AND MALIGNANCY.  ? ?C.  ESOPHAGUS; COLD BIOPSY:  ?- SQUAMOCOLUMNAR MUCOSA WITH MODERATE CHRONIC INFLAMMATION AND  REACTIVE  ?EPITHELIAL CHANGES CONSISTENT WITH REFLUX ESOPHAGITIS.  ?- NEGATIVE FOR GOBLET CELLS, DYSPLASIA, AND MALIGNANCY, SEE COMMENT.  ? ?Comment:  ?In B and C there are tiny strips of detached small intestinal epithelium  ?which appear to be misplaced from the duodenal specimen. There are no  ?gastric mucosal or esophageal mucosal samples which contain intestinal  ?metaplasia.  ? ?D.  CECUM ERYTHEMA; COLD BIOPSY:  ?- COLONIC MUCOSA WITH INTACT CRYPT ARCHITECTURE.  ?- FOCAL ACTIVE COLITIS.  ?- NEGATIVE FOR MICROSCOPIC COLITIS, DYSPLASIA, AND MALIGNANCY.  ? ?Past Medical History:  ?Diagnosis Date  ? Allergy   ? Arthritis, rheumatoid ([place])   ? Degenerative disc disease, cervical   ? GERD (gastroesophageal reflux disease)   ? Gestational diabetes   ? patient denies  ? Glaucoma   ? H/O bronchitis   ? Headache   ? migraine  ? History of ovarian cyst   ? Hypertension   ? pre-eclampsia with first child  ? Increased pressure in the eye, bilateral   ? PONV (postoperative nausea and vomiting)   ? Restless leg   ? Shingles 2021  ? ? ?Past Surgical History:  ?Procedure Laterality Date  ? CERVICAL POLYPECTOMY N/A 04/11/2017  ? Procedure: CERVICAL POLYPECTOMY;  Surgeon: Rubie Maid, MD;  Location: [facility];  Service: Gynecology;  Laterality: N/A;  ? COLONOSCOPY WITH PROPOFOL N/A 03/24/2021  ? Procedure: COLONOSCOPY WITH BIOPSY;  Surgeon: Virgel Manifold, MD;  Location: [facility];  Service: Endoscopy;  Laterality: N/A;  ? COLONOSCOPY WITH PROPOFOL N/A 09/18/2021  ? Procedure: COLONOSCOPY WITH PROPOFOL;  Surgeon: Virgel Manifold,  MD;  Location: [facility];  Service: Endoscopy;  Laterality: N/A;  ? ESOPHAGOGASTRODUODENOSCOPY N/A 09/18/2021  ? Procedure: ESOPHAGOGASTRODUODENOSCOPY (EGD);  Surgeon: Virgel Manifold, MD;  Location: [facility];  Service: Endoscopy;  Laterality: N/A;  ? HYSTEROSCOPY WITH D & C N/A 04/11/2017  ? Procedure: DILATATION AND CURETTAGE /HYSTEROSCOPY;  Surgeon: Rubie Maid,  MD;  Location: [facility];  Service: Gynecology;  Laterality: N/A;  ? IUD REMOVAL N/A 05/05/2017  ? Procedure: INTRAUTERINE DEVICE (IUD) REMOVAL;  Surgeon: Rubie Maid, MD;  Location: [facility];  Service: Gynecology;  Laterality: N/A;  ? LAPAROSCOPIC SALPINGO OOPHERECTOMY Bilateral 05/05/2017  ? Procedure: LAPAROSCOPIC BILATERAL SALPINGO OOPHORECTOMY;  Surgeon: Rubie Maid, MD;  Location: [facility];  Service: Gynecology;  Laterality: Bilateral;  ? LAPAROSCOPY N/A 04/11/2017  ? Procedure: LAPAROSCOPY DIAGNOSTIC;  Surgeon: Rubie Maid, MD;  Location: [facility];  Service: Gynecology;  Laterality: N/A;  ? PARTIAL HYSTERECTOMY    ? Ovaries and Tubes  ? POLYPECTOMY N/A 03/24/2021  ? Procedure: POLYPECTOMY;  Surgeon: Virgel Manifold, MD;  Location: [facility];  Service: Endoscopy;  Laterality: N/A;  ? TUBAL LIGATION    ? ?Current Outpatient Medications:  ?  acetaminophen (TYLENOL) 650 MG CR tablet, Take 650 mg by mouth every 8 (eight) hours as needed for pain., Disp: , Rfl:  ?  Albuterol Sulfate (VENTOLIN HFA IN), Inhale into the lungs., Disp: , Rfl:  ?  amLODipine (NORVASC) 5 MG tablet, Take 1 tablet (5 mg total) by mouth daily., Disp: 90 tablet, Rfl: 3 ?  baclofen (LIORESAL) 10 MG tablet, Take 1 tablet (10 mg total) by mouth 2 (two) times daily., Disp: 90 tablet, Rfl: 0 ?  clindamycin (CLINDAGEL) 1 % gel, Apply topically 2 (two) times daily., Disp: 30 g, Rfl: 4 ?  DULoxetine (CYMBALTA) 60 MG capsule, Take 1 capsule (60 mg total) by mouth 2 (two) times daily., Disp: 180 capsule, Rfl: 1 ?  fexofenadine (ALLEGRA ALLERGY) 180 MG tablet, Take 1 tablet (180 mg total) by mouth daily., Disp: 90 tablet, Rfl: 1 ?  gabapentin (NEURONTIN) 100 MG capsule, Take 2 capsules (200 mg total) by mouth 4 (four) times daily., Disp: 720 capsule, Rfl: 1 ?  hydroxychloroquine (PLAQUENIL) 200 MG tablet, Take 200 mg by mouth 2 (two) times daily., Disp: , Rfl:  ?  Melatonin 5 MG CAPS, Take by mouth., Disp: , Rfl:  ?  meloxicam (MOBIC) 7.5  MG tablet, TAKE 1 TABLET DAILY WITH FOOD AS NEEDED FOR PAIN, Disp: 90 tablet, Rfl: 3 ?  montelukast (SINGULAIR) 10 MG tablet, Take 1 tablet (10 mg total) by mouth at bedtime., Disp: 90 tablet, Rfl: 0 ?  nortriptyline (PAMELOR) 10 MG capsule, Take 1 capsule (10 mg total) by mouth at bedtime., Disp: 90 capsule, Rfl: 0 ?  ondansetron (ZOFRAN ODT) 8 MG disintegrating tablet, Take 1 tablet (8 mg total) by mouth 2 (two) times daily as needed for nausea or vomiting (migraine)., Disp: 30 tablet, Rfl: 1 ?  propranolol (INDERAL) 40 MG tablet, Take 1 tablet (40 mg total) by mouth 2 (two) times daily., Disp: 180 tablet, Rfl: 1 ?  RINVOQ 15 MG TB24, Take 1 tablet by mouth daily., Disp: , Rfl:  ?  SUMAtriptan (IMITREX) 50 MG tablet, TAKE 1 TABLET BY MOUTH ONCE FOR 1 DOSE. MAY REPEAT IN 2 HOURS IF HEADACHE PERSISTS OR RECURS, Disp: 30 tablet, Rfl: 1 ?  traMADol (ULTRAM) 50 MG tablet, Take 1 tablet (50 mg total) by mouth every 12 (twelve) hours as needed., Disp: 60  tablet, Rfl: 2 ?  XIIDRA 5 % SOLN, Place 1 drop into both eyes., Disp: , Rfl:  ?  triazolam (HALCION) 0.125 MG tablet, Take by mouth. (Patient not taking: Reported on 04/12/2022), Disp: , Rfl:  ? ? ? ?Family History  ?Problem Relation Age of Onset  ? Schizophrenia Mother   ? Bipolar disorder Mother   ? Alcohol abuse Father   ? Hypertension Father   ? Hypertension Brother   ? Breast cancer Brother   ? Hypertension Maternal Grandmother   ? Seizures Paternal Grandmother   ? Diabetes Paternal Grandmother   ? Thyroid cancer Neg Hx   ?  ? ?Social History  ? ?Tobacco Use  ? Smoking status: Former  ?  Packs/day: 1.00  ?  Years: 6.00  ?  Pack years: 6.00  ?  Types: Cigarettes  ?  Quit date: 05/29/2000  ?  Years since quitting: 21.8  ? Smokeless tobacco: Never  ? Tobacco comments:  ?  1/2-1 PPD  ?Vaping Use  ? Vaping Use: Never used  ?Substance Use Topics  ? Alcohol use: No  ?  Alcohol/week: 0.0 standard drinks  ? Drug use: No  ? ? ?Allergies as of 04/12/2022 - Review Complete  04/12/2022  ?Allergen Reaction Noted  ? Dried figs [ficus] Anaphylaxis 08/05/2020  ? Flonase [fluticasone propionate]  03/04/2017  ? Sulfa antibiotics Swelling 10/08/2015  ? Tussionex pennkinetic er [employer] p

## 2022-04-12 NOTE — Patient Instructions (Signed)
Gave Linz[facility] 145 samples. Please let us know how they work and we can give you a prescription for them  ?

## 2022-04-13 ENCOUNTER — Encounter: Payer: Self-pay | Admitting: Gastroenterology

## 2022-04-13 MED ORDER — LUBIPROSTONE 8 MCG PO CAPS: 8.0000 ug | ORAL_CAPSULE | Freq: Two times a day (BID) | ORAL | 2 refills | Status: DC

## 2022-04-14 ENCOUNTER — Telehealth: Payer: Managed Care, Other (non HMO) | Admitting: Physician Assistant

## 2022-04-14 DIAGNOSIS — R3989 Other symptoms and signs involving the genitourinary system: Secondary | ICD-10-CM

## 2022-04-14 MED ORDER — CIPROFLOXACIN HCL 250 MG PO TABS: 250.0000 mg | ORAL_TABLET | Freq: Two times a day (BID) | ORAL | 0 refills | Status: AC

## 2022-04-14 NOTE — Progress Notes (Signed)
Patient seen earlier today. Letting us know the pharmacy states they did not get her Rx. Rx resent to pharmacy. Called pharmacy to confirm receipt of medication. Patient aware.  ? ?No charge for visit.  ?

## 2022-04-14 NOTE — Progress Notes (Signed)
?Virtual Visit Consent  ? ?Yvonne [name], you are scheduled for a virtual visit with a [facility] provider today. Just as with appointments in the office, your consent must be obtained to participate. Your consent will be active for this visit and any virtual visit you may have with one of our providers in the next 365 days. If you have a MyChart account, a copy of this consent can be sent to you electronically. ? ?As this is a virtual visit, video technology does not allow for your provider to perform a traditional examination. This may limit your provider's ability to fully assess your condition. If your provider identifies any concerns that need to be evaluated in person or the need to arrange testing (such as labs, EKG, etc.), we will make arrangements to do so. Although advances in technology are sophisticated, we cannot ensure that it will always work on either your end or our end. If the connection with a video visit is poor, the visit may have to be switched to a telephone visit. With either a video or telephone visit, we are not always able to ensure that we have a secure connection. ? ?By engaging in this virtual visit, you consent to the provision of healthcare and authorize for your insurance to be billed (if applicable) for the services provided during this visit. Depending on your insurance coverage, you may receive a charge related to this service. ? ?I need to obtain your verbal consent now. Are you willing to proceed with your visit today? Yvonne [name] has provided verbal consent on 04/14/2022 for a virtual visit (video or telephone). Leeanne Rio, PA-C ? ?Date: 04/14/2022 8:13 AM ? ?Virtual Visit via Video Note  ? ?ILeeanne Rio, connected with  Yvonne [name]  ([id number], 17-Jun-1966, 56) on 04/14/22 at  8:30 AM EDT by a video-enabled telemedicine application and verified that I am speaking with the correct person using two identifiers. ? ?Location: ?Patient: Virtual Visit Location  Patient: Home ?Provider: Virtual Visit Location Provider: Home Office ?  ?I discussed the limitations of evaluation and management by telemedicine and the availability of in person appointments. The patient expressed understanding and agreed to proceed.   ? ?History of Present Illness: ?Yvonne [name] is a 56 y.o. who identifies as a female who was assigned female at birth, and is being seen today for possible UTI. 56 Endorses symptoms starting Monday with some mild urgency and frequency with dysuria starting last night. Denies fever, chills or vomiting. Some mild nausea today. Some mild low back pain bilaterally. A month ago with a UTI -- treated with Keflex with resolution of symptoms.  ? ? ?HPI: HPI  ?Problems:  ?Patient Active Problem List  ? Diagnosis Date Noted  ? Hepatic steatosis 02/01/2022  ? Thyroid nodule 01/25/2022  ? Bronchitis 11/10/2021  ? Left foot pain 10/21/2021  ? Columnar epithelial-lined lower esophagus   ? Gastric erythema   ? History of colon polyps   ? Rectal polyp   ? Erythema of colon   ? Sinusitis 09/09/2021  ? Alternating constipation and diarrhea 07/29/2021  ? Special screening for malignant neoplasms, colon   ? Polyp of colon   ? 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  ? COVID-19 12/19/2020  ? Cervical radicular pain 07/10/2020  ? Cervical facet joint syndrome 07/10/2020  ? Lumbar spondylosis 07/10/2020  ? Chronic pain syndrome 07/10/2020  ? Atherosclerosis of aorta ([place]) 03/25/2020  ? Arthralgia 03/19/2020  ? Restless leg 01/01/2020  ?  Hyperglycemia 10/28/2019  ? Morbid obesity with body mass index (BMI) of 40.0 or higher ([place]) 04/29/2019  ? Migraines 03/29/2019  ? Primary osteoarthritis of both knees 01/26/2019  ? Screen for colon cancer 01/19/2019  ? Encounter for long-term (current) use of high-risk medication 01/19/2019  ? Non-seasonal allergic rhinitis 01/19/2019  ? Rheumatoid factor positive 01/17/2019  ? Prediabetes 12/15/2018  ? Chronic pain of both knees 11/17/2018  ?  Right wrist pain 11/17/2018  ? GERD (gastroesophageal reflux disease) 03/31/2018  ? Skin lesion 03/01/2018  ? Hayfever 02/08/2018  ? Chronic nonintractable headache 02/08/2018  ? Hypertension 02/08/2018  ? Menopausal hot flushes 02/08/2018  ?  ?Allergies:  ?Allergies  ?Allergen Reactions  ? Dried Figs [Ficus] Anaphylaxis  ?  Tongue tingling & felt like throat was swelling.    ? Flonase [Fluticasone Propionate]   ?  Increases eye pressure   ? Sulfa Antibiotics Swelling  ? Tussionex Pennkinetic Er [Hydrocod Poli-Chlorphe Poli Er] Itching  ?  Hyperactive   ? ?Medications:  ?Current Outpatient Medications:  ?  acetaminophen (TYLENOL) 650 MG CR tablet, Take 650 mg by mouth every 8 (eight) hours as needed for pain., Disp: , Rfl:  ?  Albuterol Sulfate (VENTOLIN HFA IN), Inhale into the lungs., Disp: , Rfl:  ?  amLODipine (NORVASC) 5 MG tablet, Take 1 tablet (5 mg total) by mouth daily., Disp: 90 tablet, Rfl: 3 ?  baclofen (LIORESAL) 10 MG tablet, Take 1 tablet (10 mg total) by mouth 2 (two) times daily., Disp: 90 tablet, Rfl: 0 ?  clindamycin (CLINDAGEL) 1 % gel, Apply topically 2 (two) times daily., Disp: 30 g, Rfl: 4 ?  DULoxetine (CYMBALTA) 60 MG capsule, Take 1 capsule (60 mg total) by mouth 2 (two) times daily., Disp: 180 capsule, Rfl: 1 ?  fexofenadine (ALLEGRA ALLERGY) 180 MG tablet, Take 1 tablet (180 mg total) by mouth daily., Disp: 90 tablet, Rfl: 1 ?  gabapentin (NEURONTIN) 100 MG capsule, Take 2 capsules (200 mg total) by mouth 4 (four) times daily., Disp: 720 capsule, Rfl: 1 ?  hydroxychloroquine (PLAQUENIL) 200 MG tablet, Take 200 mg by mouth 2 (two) times daily., Disp: , Rfl:  ?  lubiprostone (AMITIZA) 8 MCG capsule, Take 1 capsule (8 mcg total) by mouth 2 (two) times daily with a meal., Disp: 60 capsule, Rfl: 2 ?  Melatonin 5 MG CAPS, Take by mouth., Disp: , Rfl:  ?  meloxicam (MOBIC) 7.5 MG tablet, TAKE 1 TABLET DAILY WITH FOOD AS NEEDED FOR PAIN, Disp: 90 tablet, Rfl: 3 ?  montelukast (SINGULAIR) 10 MG  tablet, Take 1 tablet (10 mg total) by mouth at bedtime., Disp: 90 tablet, Rfl: 0 ?  nortriptyline (PAMELOR) 10 MG capsule, Take 1 capsule (10 mg total) by mouth at bedtime., Disp: 90 capsule, Rfl: 0 ?  ondansetron (ZOFRAN ODT) 8 MG disintegrating tablet, Take 1 tablet (8 mg total) by mouth 2 (two) times daily as needed for nausea or vomiting (migraine)., Disp: 30 tablet, Rfl: 1 ?  propranolol (INDERAL) 40 MG tablet, Take 1 tablet (40 mg total) by mouth 2 (two) times daily., Disp: 180 tablet, Rfl: 1 ?  RINVOQ 15 MG TB24, Take 1 tablet by mouth daily., Disp: , Rfl:  ?  SUMAtriptan (IMITREX) 50 MG tablet, TAKE 1 TABLET BY MOUTH ONCE FOR 1 DOSE. MAY REPEAT IN 2 HOURS IF HEADACHE PERSISTS OR RECURS, Disp: 30 tablet, Rfl: 1 ?  traMADol (ULTRAM) 50 MG tablet, Take 1 tablet (50 mg total) by mouth every 12 (  twelve) hours as needed., Disp: 60 tablet, Rfl: 2 ?  triazolam (HALCION) 0.125 MG tablet, Take by mouth. (Patient not taking: Reported on 04/12/2022), Disp: , Rfl:  ?  XIIDRA 5 % SOLN, Place 1 drop into both eyes., Disp: , Rfl:  ? ?Observations/Objective: ?Patient is well-developed, well-nourished in no acute distress.  ?Resting comfortably at home.  ?Head is normocephalic, atraumatic.  ?No labored breathing. ?Speech is clear and coherent with logical content.  ?Patient is alert and oriented at baseline.  ? ?Assessment and Plan: ?1. Suspected UTI ? ?Giving her allergies and recent use of Keflex courses (concern for resistance) will give Cipro 250 mg BID x 5 days.  She has taken before and tolerated well. She is to schedule follow-up with her PCP giving she is having more frequent UTI. Strict ER precautions reviewed.  ? ?Follow Up Instructions: ?I discussed the assessment and treatment plan with the patient. The patient was provided an opportunity to ask questions and all were answered. The patient agreed with the plan and demonstrated an understanding of the instructions.  A copy of instructions were sent to the patient  via MyChart unless otherwise noted below.  ? ?The patient was advised to call back or seek an in-person evaluation if the symptoms worsen or if the condition fails to improve as anticipated. ? ?Time:  ?

## 2022-04-14 NOTE — Patient Instructions (Signed)
?[name], thank you for joining Leeanne Rio, PA-C for today's virtual visit.  While this provider is not your primary care provider (PCP), if your PCP is located in our provider database this encounter information will be shared with them immediately following your visit. ? ?Consent: ?(Patient) Yvonne [name] provided verbal consent for this virtual visit at the beginning of the encounter. ? ?Current Medications: ? ?Current Outpatient Medications:  ?  acetaminophen (TYLENOL) 650 MG CR tablet, Take 650 mg by mouth every 8 (eight) hours as needed for pain., Disp: , Rfl:  ?  Albuterol Sulfate (VENTOLIN HFA IN), Inhale into the lungs., Disp: , Rfl:  ?  amLODipine (NORVASC) 5 MG tablet, Take 1 tablet (5 mg total) by mouth daily., Disp: 90 tablet, Rfl: 3 ?  baclofen (LIORESAL) 10 MG tablet, Take 1 tablet (10 mg total) by mouth 2 (two) times daily., Disp: 90 tablet, Rfl: 0 ?  clindamycin (CLINDAGEL) 1 % gel, Apply topically 2 (two) times daily., Disp: 30 g, Rfl: 4 ?  DULoxetine (CYMBALTA) 60 MG capsule, Take 1 capsule (60 mg total) by mouth 2 (two) times daily., Disp: 180 capsule, Rfl: 1 ?  fexofenadine (ALLEGRA ALLERGY) 180 MG tablet, Take 1 tablet (180 mg total) by mouth daily., Disp: 90 tablet, Rfl: 1 ?  gabapentin (NEURONTIN) 100 MG capsule, Take 2 capsules (200 mg total) by mouth 4 (four) times daily., Disp: 720 capsule, Rfl: 1 ?  hydroxychloroquine (PLAQUENIL) 200 MG tablet, Take 200 mg by mouth 2 (two) times daily., Disp: , Rfl:  ?  lubiprostone (AMITIZA) 8 MCG capsule, Take 1 capsule (8 mcg total) by mouth 2 (two) times daily with a meal., Disp: 60 capsule, Rfl: 2 ?  Melatonin 5 MG CAPS, Take by mouth., Disp: , Rfl:  ?  meloxicam (MOBIC) 7.5 MG tablet, TAKE 1 TABLET DAILY WITH FOOD AS NEEDED FOR PAIN, Disp: 90 tablet, Rfl: 3 ?  montelukast (SINGULAIR) 10 MG tablet, Take 1 tablet (10 mg total) by mouth at bedtime., Disp: 90 tablet, Rfl: 0 ?  nortriptyline (PAMELOR) 10 MG capsule, Take 1 capsule (10  mg total) by mouth at bedtime., Disp: 90 capsule, Rfl: 0 ?  ondansetron (ZOFRAN ODT) 8 MG disintegrating tablet, Take 1 tablet (8 mg total) by mouth 2 (two) times daily as needed for nausea or vomiting (migraine)., Disp: 30 tablet, Rfl: 1 ?  propranolol (INDERAL) 40 MG tablet, Take 1 tablet (40 mg total) by mouth 2 (two) times daily., Disp: 180 tablet, Rfl: 1 ?  RINVOQ 15 MG TB24, Take 1 tablet by mouth daily., Disp: , Rfl:  ?  SUMAtriptan (IMITREX) 50 MG tablet, TAKE 1 TABLET BY MOUTH ONCE FOR 1 DOSE. MAY REPEAT IN 2 HOURS IF HEADACHE PERSISTS OR RECURS, Disp: 30 tablet, Rfl: 1 ?  traMADol (ULTRAM) 50 MG tablet, Take 1 tablet (50 mg total) by mouth every 12 (twelve) hours as needed., Disp: 60 tablet, Rfl: 2 ?  triazolam (HALCION) 0.125 MG tablet, Take by mouth. (Patient not taking: Reported on 04/12/2022), Disp: , Rfl:  ?  XIIDRA 5 % SOLN, Place 1 drop into both eyes., Disp: , Rfl:   ? ?Medications ordered in this encounter:  ?No orders of the defined types were placed in this encounter. ?  ? ?*If you need refills on other medications prior to your next appointment, please contact your pharmacy* ? ?Follow-Up: ?Call back or seek an in-person evaluation if the symptoms worsen or if the condition fails to improve as anticipated. ? ?  Other Instructions ?Your symptoms are consistent with a bladder infection, also called acute cystitis. Please take your antibiotic (Cipro) as directed until all pills are gone.  Stay very well hydrated.  Consider a daily probiotic (Align, Culturelle, or Activia) to help prevent stomach upset caused by the antibiotic.  Taking a probiotic daily may also help prevent recurrent UTIs.  Also consider taking AZO (Phenazopyridine) tablets to help decrease pain with urination.   ? ?Urinary Tract Infection ?A urinary tract infection (UTI) can occur any place along the urinary tract. The tract includes the kidneys, ureters, bladder, and urethra. A type of germ called bacteria often causes a UTI. UTIs  are often helped with antibiotic medicine.  ?HOME CARE  ?If given, take antibiotics as told by your doctor. Finish them even if you start to feel better. ?Drink enough fluids to keep your pee (urine) clear or pale yellow. ?Avoid tea, drinks with caffeine, and bubbly (carbonated) drinks. ?Pee often. Avoid holding your pee in for a long time. ?Pee before and after having sex (intercourse). ?Wipe from front to back after you poop (bowel movement) if you are a woman. Use each tissue only once. ?GET HELP RIGHT AWAY IF:  ?You have back pain. ?You have lower belly (abdominal) pain. ?You have chills. ?You feel sick to your stomach (nauseous). ?You throw up (vomit). ?Your burning or discomfort with peeing does not go away. ?You have a fever. ?Your symptoms are not better in 3 days. ?MAKE SURE YOU:  ?Understand these instructions. ?Will watch your condition. ?Will get help right away if you are not doing well or get worse. ?Document Released: 05/03/2008 Document Revised: 08/09/2012 Document Reviewed: 06/15/2012 ?ExitCare? Patient Information ?2015 [employer]. This information is not intended to replace advice given to you by your health care provider. Make sure you discuss any questions you have with your health care provider. ? ? ? ?If you have been instructed to have an in-person evaluation today at a local Urgent Care facility, please use the link below. It will take you to a list of all of our available [facility], including address, phone number and hours of operation. Please do not delay care.  ?[facility] ? ?If you or a family member do not have a primary care provider, use the link below to schedule a visit and establish care. When you choose a [facility] primary care physician or advanced practice provider, you gain a long-term partner in health. ?Find a Primary Care Provider ? ?Learn more about [facility]'s in-office and virtual care options: ?[facility]  ?

## 2022-04-14 NOTE — Addendum Note (Signed)
Addended by: Brunetta Jeans on: 04/14/2022 05:32 PM ? ? Modules accepted: Orders ? ?

## 2022-04-16 ENCOUNTER — Encounter: Payer: Self-pay | Admitting: Gastroenterology

## 2022-05-08 ENCOUNTER — Other Ambulatory Visit: Payer: Self-pay | Admitting: Family

## 2022-05-08 DIAGNOSIS — R519 Headache, unspecified: Secondary | ICD-10-CM

## 2022-05-12 ENCOUNTER — Encounter: Payer: Self-pay | Admitting: Family

## 2022-05-13 ENCOUNTER — Other Ambulatory Visit: Payer: Self-pay | Admitting: Family

## 2022-05-13 DIAGNOSIS — J3089 Other allergic rhinitis: Secondary | ICD-10-CM

## 2022-05-14 ENCOUNTER — Encounter: Payer: Self-pay | Admitting: Family

## 2022-05-14 ENCOUNTER — Other Ambulatory Visit: Payer: Self-pay

## 2022-05-14 MED ORDER — ALBUTEROL SULFATE HFA 108 (90 BASE) MCG/ACT IN AERS: 1.0000 | INHALATION_SPRAY | RESPIRATORY_TRACT | 0 refills | Status: DC | PRN

## 2022-05-21 ENCOUNTER — Encounter: Payer: Managed Care, Other (non HMO) | Admitting: Obstetrics and Gynecology

## 2022-05-24 ENCOUNTER — Encounter: Payer: Self-pay | Admitting: Family

## 2022-05-25 ENCOUNTER — Other Ambulatory Visit: Payer: Self-pay

## 2022-05-25 DIAGNOSIS — M62838 Other muscle spasm: Secondary | ICD-10-CM

## 2022-05-25 MED ORDER — BACLOFEN 10 MG PO TABS: 10.0000 mg | ORAL_TABLET | Freq: Two times a day (BID) | ORAL | 0 refills | Status: DC

## 2022-05-26 ENCOUNTER — Other Ambulatory Visit: Payer: Self-pay | Admitting: Family

## 2022-05-26 DIAGNOSIS — G43809 Other migraine, not intractable, without status migrainosus: Secondary | ICD-10-CM

## 2022-06-10 ENCOUNTER — Encounter: Payer: Managed Care, Other (non HMO) | Admitting: Obstetrics and Gynecology

## 2022-07-06 ENCOUNTER — Other Ambulatory Visit: Payer: Self-pay | Admitting: Podiatry

## 2022-07-06 DIAGNOSIS — M79672 Pain in left foot: Secondary | ICD-10-CM

## 2022-07-08 ENCOUNTER — Other Ambulatory Visit: Payer: Self-pay | Admitting: Gastroenterology

## 2022-07-14 ENCOUNTER — Ambulatory Visit
Admission: RE | Admit: 2022-07-14 | Discharge: 2022-07-14 | Discharge status: Home / Self Care | Payer: Managed Care, Other (non HMO) | Source: Ambulatory Visit | Attending: Podiatry | Admitting: Podiatry

## 2022-07-14 DIAGNOSIS — M79672 Pain in left foot: Secondary | ICD-10-CM

## 2022-07-21 ENCOUNTER — Encounter: Payer: Self-pay | Admitting: Family

## 2022-07-29 ENCOUNTER — Encounter: Payer: Self-pay | Admitting: Obstetrics and Gynecology

## 2022-07-29 ENCOUNTER — Ambulatory Visit (INDEPENDENT_AMBULATORY_CARE_PROVIDER_SITE_OTHER): Payer: Managed Care, Other (non HMO) | Admitting: Obstetrics and Gynecology

## 2022-07-29 ENCOUNTER — Other Ambulatory Visit (HOSPITAL_COMMUNITY)
Admission: RE | Admit: 2022-07-29 | Discharge: 2022-07-29 | Disposition: A | Discharge status: Home / Self Care | Payer: Managed Care, Other (non HMO) | Source: Ambulatory Visit | Attending: Obstetrics and Gynecology | Admitting: Obstetrics and Gynecology

## 2022-07-29 VITALS — BP 144/86 | HR 71 | Resp 16 | Ht 62.5 in | Wt 253.5 lb

## 2022-07-29 DIAGNOSIS — N898 Other specified noninflammatory disorders of vagina: Secondary | ICD-10-CM | POA: Insufficient documentation

## 2022-07-29 DIAGNOSIS — N941 Unspecified dyspareunia: Secondary | ICD-10-CM

## 2022-07-29 DIAGNOSIS — N952 Postmenopausal atrophic vaginitis: Secondary | ICD-10-CM

## 2022-07-29 DIAGNOSIS — R300 Dysuria: Secondary | ICD-10-CM | POA: Diagnosis not present

## 2022-07-29 DIAGNOSIS — Z01419 Encounter for gynecological examination (general) (routine) without abnormal findings: Secondary | ICD-10-CM

## 2022-07-29 DIAGNOSIS — Z1231 Encounter for screening mammogram for malignant neoplasm of breast: Secondary | ICD-10-CM | POA: Diagnosis not present

## 2022-07-29 DIAGNOSIS — N951 Menopausal and female climacteric states: Secondary | ICD-10-CM

## 2022-07-29 LAB — POCT URINALYSIS DIPSTICK
Bilirubin, UA: NEGATIVE
Blood, UA: NEGATIVE
Glucose, UA: NEGATIVE
Ketones, UA: NEGATIVE
Leukocytes, UA: NEGATIVE
Nitrite, UA: NEGATIVE
Protein, UA: POSITIVE — AB
Spec Grav, UA: 1.025
Urobilinogen, UA: 0.2 U/dL
pH, UA: 6

## 2022-07-29 MED ORDER — PREMARIN 0.625 MG/GM VA CREA: 1.0000 | TOPICAL_CREAM | VAGINAL | 12 refills | Status: DC

## 2022-07-29 NOTE — Patient Instructions (Signed)

## 2022-07-29 NOTE — Progress Notes (Signed)
ANNUAL PREVENTATIVE CARE GYNECOLOGY  ENCOUNTER NOTE  Subjective:       Yvonne [name] is a 56 y.o. G2P2 female here for a routine annual gynecologic exam. The patient is sexually active. The patient is not taking hormone replacement therapy. Patient denies post-menopausal vaginal bleeding. The patient wears seatbelts: yes. The patient participates in regular exercise: yes. Has the patient ever been transfused or tattooed?: yes. The patient reports that there is not domestic violence in her life.  Current complaints: 1.  She has been having some pain with urination x 3 days. It got worse on Wednesday. She also has some vaginal itching and discharge x 1 week.   2.  Also reports vaginal pain with intercourse recently.    Gynecologic History Patient's last menstrual period was 05/06/2017 (exact date).Surgical menopause (bilateral oophorectomy in 2018) Contraception: post menopausal status Last Pap:  04/01/2020. Results were: normal Last mammogram: 12/23/2020. Results were: normal Last Colonoscopy: 09/18/2021. Every 7 years    Obstetric History OB History  Gravida Para Term Preterm AB Living  '2 2       2  '$ SAB IAB Ectopic Multiple Live Births          2    # Outcome Date GA Lbr Len/2nd Weight Sex Delivery Anes PTL Lv  2 Para 1995    F Vag-Spont   LIV  1 Para 1992    M Vag-Spont   LIV    Past Medical History:  Diagnosis Date   Allergy    Arthritis, rheumatoid (HCC)    Degenerative disc disease, cervical    GERD (gastroesophageal reflux disease)    Gestational diabetes    patient denies   Glaucoma    H/O bronchitis    Headache    migraine   History of ovarian cyst    Hypertension    pre-eclampsia with first child   Increased pressure in the eye, bilateral    PONV (postoperative nausea and vomiting)    Restless leg    Shingles 2021    Family History  Problem Relation Age of Onset   Schizophrenia Mother    Bipolar disorder Mother    Alcohol abuse Father     Hypertension Father    Hypertension Brother    Breast cancer Brother    Hypertension Maternal Grandmother    Seizures Paternal Grandmother    Diabetes Paternal Grandmother    Thyroid cancer Neg Hx     Past Surgical History:  Procedure Laterality Date   CERVICAL POLYPECTOMY N/A 04/11/2017   Procedure: CERVICAL POLYPECTOMY;  Surgeon: Rubie Maid, MD;  Location: [facility];  Service: Gynecology;  Laterality: N/A;   COLONOSCOPY WITH PROPOFOL N/A 03/24/2021   Procedure: COLONOSCOPY WITH BIOPSY;  Surgeon: Virgel Manifold, MD;  Location: [place];  Service: Endoscopy;  Laterality: N/A;   COLONOSCOPY WITH PROPOFOL N/A 09/18/2021   Procedure: COLONOSCOPY WITH PROPOFOL;  Surgeon: Virgel Manifold, MD;  Location: [facility];  Service: Endoscopy;  Laterality: N/A;   ESOPHAGOGASTRODUODENOSCOPY N/A 09/18/2021   Procedure: ESOPHAGOGASTRODUODENOSCOPY (EGD);  Surgeon: Virgel Manifold, MD;  Location: [facility];  Service: Endoscopy;  Laterality: N/A;   HYSTEROSCOPY WITH D & C N/A 04/11/2017   Procedure: DILATATION AND CURETTAGE /HYSTEROSCOPY;  Surgeon: Rubie Maid, MD;  Location: [facility];  Service: Gynecology;  Laterality: N/A;   IUD REMOVAL N/A 05/05/2017   Procedure: INTRAUTERINE DEVICE (IUD) REMOVAL;  Surgeon: Rubie Maid, MD;  Location: [facility];  Service: Gynecology;  Laterality: N/A;  LAPAROSCOPIC SALPINGO OOPHERECTOMY Bilateral 05/05/2017   Procedure: LAPAROSCOPIC BILATERAL SALPINGO OOPHORECTOMY;  Surgeon: Rubie Maid, MD;  Location: [facility];  Service: Gynecology;  Laterality: Bilateral;   LAPAROSCOPY N/A 04/11/2017   Procedure: LAPAROSCOPY DIAGNOSTIC;  Surgeon: Rubie Maid, MD;  Location: [facility];  Service: Gynecology;  Laterality: N/A;   PARTIAL HYSTERECTOMY     Ovaries and Tubes   POLYPECTOMY N/A 03/24/2021   Procedure: POLYPECTOMY;  Surgeon: Virgel Manifold, MD;  Location: [place];  Service: Endoscopy;  Laterality: N/A;   TUBAL LIGATION       Social History   Socioeconomic History   Marital status: Married    Spouse name: Not on file   Number of children: Not on file   Years of education: Not on file   Highest education level: Not on file  Occupational History   Occupation: Chemical engineer at [employer]  Tobacco Use   Smoking status: Former    Packs/day: 1.00    Years: 6.00    Total pack years: 6.00    Types: Cigarettes    Quit date: 05/29/2000    Years since quitting: 22.1   Smokeless tobacco: Never   Tobacco comments:    1/2-1 PPD  Vaping Use   Vaping Use: Never used  Substance and Sexual Activity   Alcohol use: No    Alcohol/week: 0.0 standard drinks of alcohol   Drug use: No   Sexual activity: Yes    Birth control/protection: Surgical  Other Topics Concern   Not on file  Social History Narrative   Working as Research scientist (physical sciences) at [employer] Determinants of Health   Financial Resource Strain: Not on file  Food Insecurity: Not on file  Transportation Needs: Not on file  Physical Activity: Not on file  Stress: Not on file  Social Connections: Not on file  Intimate Partner Violence: Not on file    Current Outpatient Medications on File Prior to Visit  Medication Sig Dispense Refill   acetaminophen (TYLENOL) 650 MG CR tablet Take 650 mg by mouth every 8 (eight) hours as needed for pain.     albuterol (VENTOLIN HFA) 108 (90 Base) MCG/ACT inhaler Inhale 1-2 puffs into the lungs as needed. 18 g 0   amLODipine (NORVASC) 5 MG tablet Take 1 tablet (5 mg total) by mouth daily. 90 tablet 3   baclofen (LIORESAL) 10 MG tablet Take 1 tablet (10 mg total) by mouth 2 (two) times daily. 90 tablet 0   clindamycin (CLINDAGEL) 1 % gel Apply topically 2 (two) times daily. 30 g 4   cycloSPORINE (RESTASIS) 0.05 % ophthalmic emulsion SMARTSIG:In Eye(s)     DULoxetine (CYMBALTA) 60 MG capsule Take 1 capsule (60 mg total) by mouth 2 (two) times daily. 180 capsule 1   fexofenadine (ALLEGRA ALLERGY) 180  MG tablet Take 1 tablet (180 mg total) by mouth daily. 90 tablet 1   gabapentin (NEURONTIN) 100 MG capsule Take 2 capsules (200 mg total) by mouth 4 (four) times daily. 720 capsule 1   hydroxychloroquine (PLAQUENIL) 200 MG tablet Take 200 mg by mouth 2 (two) times daily.     latanoprost (XALATAN) 0.005 % ophthalmic solution 1 drop at bedtime.     lubiprostone (AMITIZA) 8 MCG capsule TAKE 1 CAPSULE BY MOUTH TWICE DAILY WITH A MEAL 60 capsule 5   Melatonin 5 MG CAPS Take by mouth.     meloxicam (MOBIC) 7.5 MG tablet TAKE 1 TABLET DAILY WITH FOOD AS NEEDED FOR PAIN  90 tablet 3   montelukast (SINGULAIR) 10 MG tablet TAKE 1 TABLET AT BEDTIME 90 tablet 3   nortriptyline (PAMELOR) 10 MG capsule TAKE 1 CAPSULE AT BEDTIME 90 capsule 3   omeprazole (PRILOSEC) 20 MG capsule Take 20 mg by mouth daily.     ondansetron (ZOFRAN-ODT) 8 MG disintegrating tablet PLACE 1 TABLET ON THE TONGUE TWICE A DAY AS NEEDED FOR NAUSEA OR VOMITING AND MIGRAINE 30 tablet 23   propranolol (INDERAL) 40 MG tablet Take 1 tablet (40 mg total) by mouth 2 (two) times daily. 180 tablet 1   RINVOQ 15 MG TB24 Take 1 tablet by mouth daily.     SUMAtriptan (IMITREX) 50 MG tablet TAKE 1 TABLET BY MOUTH ONCE FOR 1 DOSE. MAY REPEAT IN 2 HOURS IF HEADACHE PERSISTS OR RECURS 30 tablet 1   timolol (TIMOPTIC) 0.25 % ophthalmic solution      traMADol (ULTRAM) 50 MG tablet Take 1 tablet (50 mg total) by mouth every 12 (twelve) hours as needed. 60 tablet 2   triazolam (HALCION) 0.125 MG tablet Take by mouth.     No current facility-administered medications on file prior to visit.    Allergies  Allergen Reactions   Dried Figs [Ficus] Anaphylaxis    Tongue tingling & felt like throat was swelling.     Flonase [Fluticasone Propionate]     Increases eye pressure    Sulfa Antibiotics Swelling   Tussionex Pennkinetic Er [Hydrocod Poli-Chlorphe Poli Er] Itching    Hyperactive       Review of Systems Pertinent symptoms are noted in HPI,  remainder of review of systems negative.    Objective:   BP (!) 144/86   Pulse 71   Resp 16   Ht 5' 2.5" (1.588 m)   Wt 253 lb 8 oz (115 kg)   LMP 05/06/2017 (Exact Date) Comment: BSO  BMI 45.63 kg/m  CONSTITUTIONAL: Well-developed, well-nourished female in no acute distress.  PSYCHIATRIC: Normal mood and affect. Normal behavior. Normal judgment and thought content. [place]: Alert and oriented to person, place, and time. Normal muscle tone coordination. No cranial nerve deficit noted. HENT:  Normocephalic, atraumatic, External right and left ear normal. Oropharynx is clear and moist EYES: Conjunctivae and EOM are normal. Pupils are equal, round, and reactive to light. No scleral icterus.  NECK: Normal range of motion, supple, no masses.  Normal thyroid.  SKIN: Skin is warm and dry. No rash noted. Not diaphoretic. No erythema. No pallor. CARDIOVASCULAR: Normal heart rate noted, regular rhythm, no murmur. RESPIRATORY: Clear to auscultation bilaterally. Effort and breath sounds normal, no problems with respiration noted. BREASTS: Symmetric in size. No masses, skin changes, nipple drainage, or lymphadenopathy. ABDOMEN: Soft, normal bowel sounds, no distention noted.  No tenderness, rebound or guarding.  BLADDER: Normal PELVIC:  Bladder no bladder distension noted  Urethra: normal appearing urethra with no masses, tenderness or lesions  Vulva: normal appearing vulva with no masses, tenderness or lesions  Vagina: atrophic  Cervix: normal appearing cervix without discharge or lesions  Uterus: difficult to palpitate due to body habitus, but no abnormalities noted.   Adnexa: non-tender, non-palpable  RV: External Exam NormaI, No Rectal Masses, and Normal Sphincter tone  MUSCULOSKELETAL: Normal range of motion. No tenderness.  No cyanosis, clubbing, or edema.  2+ distal pulses. LYMPHATIC: No Axillary, Supraclavicular, or Inguinal Adenopathy.   Labs: Lab Results  Component Value Date    WBC 2.6 (L) 11/10/2021   HGB 12.2 11/10/2021   HCT 36.7 11/10/2021  MCV 92.4 11/10/2021   PLT 124 (L) 11/10/2021    Lab Results  Component Value Date   CREATININE 0.65 11/10/2021   BUN 18 11/10/2021   NA 138 11/10/2021   K 4.1 11/10/2021   CL 104 11/10/2021   CO2 29 11/10/2021    Lab Results  Component Value Date   ALT 21 05/14/2021   AST 14 05/14/2021   GGT 32 05/03/2018   ALKPHOS 109 04/18/2020   BILITOT 0.5 04/18/2020    Lab Results  Component Value Date   CHOL 244 (H) 01/14/2021   HDL 44 01/14/2021   LDLCALC 166 (H) 01/14/2021   TRIG 184 (H) 01/14/2021   CHOLHDL 5.5 (H) 01/14/2021    Lab Results  Component Value Date   TSH 1.400 01/24/2020    No results found for: "HGBA1C"    Results for orders placed or performed in visit on 07/29/22  POCT Urinalysis Dipstick  Result Value Ref Range   Color, UA     Clarity, UA     Glucose, UA Negative Negative   Bilirubin, UA Negative    Ketones, UA Negative    Spec Grav, UA 1.025 1.010 - 1.025   Blood, UA Negative    pH, UA 6.0 5.0 - 8.0   Protein, UA Positive (A) Negative   Urobilinogen, UA 0.2 0.2 or 1.0 E.U./dL   Nitrite, UA Negative    Leukocytes, UA Negative Negative   Appearance     Odor      Assessment:   1. Encounter for well woman exam with routine gynecological exam   2. Encounter for screening mammogram for malignant neoplasm of breast   3. Dysuria   4. Vaginal discharge   5. Vaginal atrophy   6. Menopausal hot flushes   7. Dyspareunia in female      Plan:  Pap:  UTD Mammogram: Ordered Colon Screening:   UTD Labs:  Urinalysis, overall normal except protein present.  Routine preventative health maintenance measures emphasized: Exercise/Diet/Weight control, Tobacco Warnings, Alcohol/Substance use risks, Stress Management.  - Discussed management of dyspareunia, vaginal atrophy, menopausal symptoms. Patient previously on systemic HRT (remote history), unable to tolerate. Offered local HRT  for dyspareunia, prescribed Premarin cream.  - Vaginal discharge, cervicovaginal culture collected.   Return to [place], MD [facility]

## 2022-07-30 ENCOUNTER — Other Ambulatory Visit: Payer: Self-pay

## 2022-07-30 ENCOUNTER — Encounter: Payer: Self-pay | Admitting: Family

## 2022-07-30 DIAGNOSIS — M62838 Other muscle spasm: Secondary | ICD-10-CM

## 2022-07-30 MED ORDER — BACLOFEN 10 MG PO TABS: 10.0000 mg | ORAL_TABLET | Freq: Two times a day (BID) | ORAL | 0 refills | Status: DC

## 2022-07-30 NOTE — Telephone Encounter (Signed)
Sent already.. Thanks!!

## 2022-07-30 NOTE — Telephone Encounter (Signed)
Send Medication to [address] [phone]

## 2022-08-03 LAB — CERVICOVAGINAL ANCILLARY ONLY
Bacterial Vaginitis (gardnerella): NEGATIVE
Candida Glabrata: NEGATIVE
Candida Vaginitis: NEGATIVE
Comment: NEGATIVE
Comment: NEGATIVE
Comment: NEGATIVE

## 2022-08-11 ENCOUNTER — Other Ambulatory Visit: Payer: Managed Care, Other (non HMO)

## 2022-08-16 ENCOUNTER — Encounter: Payer: Self-pay | Admitting: Family

## 2022-08-16 ENCOUNTER — Ambulatory Visit: Payer: Managed Care, Other (non HMO) | Admitting: Family

## 2022-08-16 VITALS — BP 138/88 | HR 62 | Temp 97.9°F | Ht 62.5 in | Wt 255.0 lb

## 2022-08-16 DIAGNOSIS — G2581 Restless legs syndrome: Secondary | ICD-10-CM | POA: Diagnosis not present

## 2022-08-16 DIAGNOSIS — I10 Essential (primary) hypertension: Secondary | ICD-10-CM

## 2022-08-16 DIAGNOSIS — M4802 Spinal stenosis, cervical region: Secondary | ICD-10-CM | POA: Diagnosis not present

## 2022-08-16 MED ORDER — GABAPENTIN 300 MG PO CAPS: 300.0000 mg | ORAL_CAPSULE | Freq: Three times a day (TID) | ORAL | 3 refills | Status: DC

## 2022-08-16 NOTE — Assessment & Plan Note (Signed)
Chronic, stable. Continue amlodipine 5mg 

## 2022-08-16 NOTE — Patient Instructions (Addendum)
Please let me know if vaginal bleeding persists   please call  and schedule your 3D mammogram and /or bone density scan as we discussed.   [facility]  ( new location in 2023)  [address], [place], [place] [address]  , [place]  [phone]   I have ordered transvaginal ultrasound.  Let us know if you dont hear back within a week in regards to an appointment being scheduled.   So that you are aware, if you are [facility] MyChart user , please pay attention to your MyChart messages as you may receive a MyChart message with a phone number to call and schedule this test/appointment own your own from our referral coordinator. This is a new process so I do not want you to miss this message.  If you are not a MyChart user, you will receive a phone call.   

## 2022-08-16 NOTE — Assessment & Plan Note (Signed)
Chronic, suboptimal control.  Reviewed previous MRI cervical spine 05/18/2020 which showed severe stenosis C3-C4.  She was previously been seen by pain management with temporary relief with epidural injection.  She politely declines referral back to pain management at this time as she has upcoming foot surgery.  We agreed we could increase gabapentin from 200 mg 4 times daily to 300 mg 4 times daily.  She will monitor for excessive sedation.  She will continue Cymbalta 60 mg twice daily, meloxicam 7.5 daily as needed, baclofen 10 mg twice daily as needed, tramadol 50 mg twice daily as needed.  Close follow up.

## 2022-08-16 NOTE — Progress Notes (Incomplete)
Subjective:    Patient ID: Yvonne [name], female    DOB: 04-15-1966, 56 y.o.   MRN: [id number]  CC: Yvonne [name] is a 56 y.o. female who presents today for follow up.   HPI: Chronic pain syndrome- compliant with tramadol 50 mg once daily, rarely she will take tramadol '50mg'$  bid.     She is compliant with Cymbalta 60 mg twice daily,meloxicam 7.5 mg, baclofen 10 mg BID prn, gabapentin '200mg'$  TID.  Overall regimen is helpful however she continues to experience numbness coming on both of her arms into her fingertips. Chronic neck pain      She has not found gabapentin to be sedating   She has upcoming right foot surgery, suspected fusion, with Dr Vickki Muff.   MRI cervical spine 04/2020 with severe stenosis C3-C4 Previously saw Lonell Face with Dr Holley Raring 04/2020. She had epidural injection at that time with temporary relief  HTN- compliant with amlodipine 5 mg HISTORY:  Past Medical History:  Diagnosis Date  . Allergy   . Arthritis, rheumatoid ([place])   . Degenerative disc disease, cervical   . GERD (gastroesophageal reflux disease)   . Gestational diabetes    patient denies  . Glaucoma   . H/O bronchitis   . Headache    migraine  . History of ovarian cyst   . Hypertension    pre-eclampsia with first child  . 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 PONV (postoperative nausea and vomiting)   . Restless leg   . Shingles 2021   Past Surgical History:  Procedure Laterality Date  . CERVICAL POLYPECTOMY N/A 04/11/2017   Procedure: CERVICAL POLYPECTOMY;  Surgeon: Rubie Maid, MD;  Location: [facility];  Service: Gynecology;  Laterality: N/A;  . COLONOSCOPY WITH PROPOFOL N/A 03/24/2021   Procedure: COLONOSCOPY WITH BIOPSY;  Surgeon: Virgel Manifold, MD;  Location: [place];  Service: Endoscopy;  Laterality: N/A;  . COLONOSCOPY WITH PROPOFOL N/A 09/18/2021   Procedure: COLONOSCOPY WITH PROPOFOL;  Surgeon: Virgel Manifold, MD;  Location: [facility];   Service: Endoscopy;  Laterality: N/A;  . ESOPHAGOGASTRODUODENOSCOPY N/A 09/18/2021   Procedure: ESOPHAGOGASTRODUODENOSCOPY (EGD);  Surgeon: Virgel Manifold, MD;  Location: [facility];  Service: Endoscopy;  Laterality: N/A;  . HYSTEROSCOPY WITH D & C N/A 04/11/2017   Procedure: DILATATION AND CURETTAGE /HYSTEROSCOPY;  Surgeon: Rubie Maid, MD;  Location: [facility];  Service: Gynecology;  Laterality: N/A;  . IUD REMOVAL N/A 05/05/2017   Procedure: INTRAUTERINE DEVICE (IUD) REMOVAL;  Surgeon: Rubie Maid, MD;  Location: [facility];  Service: Gynecology;  Laterality: N/A;  . LAPAROSCOPIC SALPINGO OOPHERECTOMY Bilateral 05/05/2017   Procedure: LAPAROSCOPIC BILATERAL SALPINGO OOPHORECTOMY;  Surgeon: Rubie Maid, MD;  Location: [facility];  Service: Gynecology;  Laterality: Bilateral;  . LAPAROSCOPY N/A 04/11/2017   Procedure: LAPAROSCOPY DIAGNOSTIC;  Surgeon: Rubie Maid, MD;  Location: [facility];  Service: Gynecology;  Laterality: N/A;  . PARTIAL HYSTERECTOMY     Ovaries and Tubes  . POLYPECTOMY N/A 03/24/2021   Procedure: POLYPECTOMY;  Surgeon: Virgel Manifold, MD;  Location: [place];  Service: Endoscopy;  Laterality: N/A;  . TUBAL LIGATION     Family History  Problem Relation Age of Onset  . Schizophrenia Mother   . Bipolar disorder Mother   . Alcohol abuse Father   . Hypertension Father   . Hypertension Brother   . Breast cancer Brother   . Hypertension Maternal Grandmother   . Seizures Paternal Grandmother   . Diabetes  Paternal Grandmother   . Thyroid cancer Neg Hx     Allergies: Dried figs [ficus], Flonase [fluticasone propionate], Sulfa antibiotics, and Tussionex pennkinetic er [hydrocod poli-chlorphe poli er] Current Outpatient Medications on File Prior to Visit  Medication Sig Dispense Refill  . acetaminophen (TYLENOL) 650 MG CR tablet Take 650 mg by mouth every 8 (eight) hours as needed for pain.    Marland Kitchen albuterol (VENTOLIN HFA) 108 (90 Base) MCG/ACT inhaler  Inhale 1-2 puffs into the lungs as needed. 18 g 0  . amLODipine (NORVASC) 5 MG tablet Take 1 tablet (5 mg total) by mouth daily. 90 tablet 3  . baclofen (LIORESAL) 10 MG tablet Take 1 tablet (10 mg total) by mouth 2 (two) times daily. 90 tablet 0  . clindamycin (CLINDAGEL) 1 % gel Apply topically 2 (two) times daily. 30 g 4  . conjugated estrogens (PREMARIN) vaginal cream Place 1 Applicatorful vaginally 3 (three) times a week. Use daily for 1 month, then decrease to 3 times weekly. 42.5 g 12  . cycloSPORINE (RESTASIS) 0.05 % ophthalmic emulsion SMARTSIG:In Eye(s)    . DULoxetine (CYMBALTA) 60 MG capsule Take 1 capsule (60 mg total) by mouth 2 (two) times daily. 180 capsule 1  . fexofenadine (ALLEGRA ALLERGY) 180 MG tablet Take 1 tablet (180 mg total) by mouth daily. 90 tablet 1  . hydroxychloroquine (PLAQUENIL) 200 MG tablet Take 200 mg by mouth 2 (two) times daily.    Marland Kitchen latanoprost (XALATAN) 0.005 % ophthalmic solution 1 drop at bedtime.    Marland Kitchen lubiprostone (AMITIZA) 8 MCG capsule TAKE 1 CAPSULE BY MOUTH TWICE DAILY WITH A MEAL 60 capsule 5  . Melatonin 5 MG CAPS Take by mouth.    . meloxicam (MOBIC) 7.5 MG tablet TAKE 1 TABLET DAILY WITH FOOD AS NEEDED FOR PAIN 90 tablet 3  . montelukast (SINGULAIR) 10 MG tablet TAKE 1 TABLET AT BEDTIME 90 tablet 3  . nortriptyline (PAMELOR) 10 MG capsule TAKE 1 CAPSULE AT BEDTIME 90 capsule 3  . omeprazole (PRILOSEC) 20 MG capsule Take 20 mg by mouth daily.    . ondansetron (ZOFRAN-ODT) 8 MG disintegrating tablet PLACE 1 TABLET ON THE TONGUE TWICE A DAY AS NEEDED FOR NAUSEA OR VOMITING AND MIGRAINE 30 tablet 23  . propranolol (INDERAL) 40 MG tablet Take 1 tablet (40 mg total) by mouth 2 (two) times daily. 180 tablet 1  . RINVOQ 15 MG TB24 Take 1 tablet by mouth daily.    . SUMAtriptan (IMITREX) 50 MG tablet TAKE 1 TABLET BY MOUTH ONCE FOR 1 DOSE. MAY REPEAT IN 2 HOURS IF HEADACHE PERSISTS OR RECURS 30 tablet 1  . timolol (TIMOPTIC) 0.25 % ophthalmic solution      . traMADol (ULTRAM) 50 MG tablet Take 1 tablet (50 mg total) by mouth every 12 (twelve) hours as needed. 60 tablet 2   No current facility-administered medications on file prior to visit.    Social History   Tobacco Use  . Smoking status: Former    Packs/day: 1.00    Years: 6.00    Total pack years: 6.00    Types: Cigarettes    Quit date: 05/29/2000    Years since quitting: 22.2  . Smokeless tobacco: Never  . Tobacco comments:    1/2-1 PPD  Vaping Use  . Vaping Use: Never used  Substance Use Topics  . Alcohol use: No    Alcohol/week: 0.0 standard drinks of alcohol  . Drug use: No    Review of Systems  Constitutional:  Negative for chills and fever.  Respiratory:  Negative for cough.   Cardiovascular:  Negative for chest pain and palpitations.  Gastrointestinal:  Negative for nausea and vomiting.  Musculoskeletal:  Positive for neck pain.  Neurological:  Positive for numbness.      Objective:    LMP 05/06/2017 (Exact Date) Comment: BSO BP Readings from Last 3 Encounters:  07/29/22 (!) 144/86  04/12/22 99/64  01/22/22 122/70   Wt Readings from Last 3 Encounters:  07/29/22 253 lb 8 oz (115 kg)  04/12/22 252 lb 4 oz (114.4 kg)  01/22/22 249 lb 6.4 oz (113.1 kg)    Physical Exam Vitals reviewed.  Constitutional:      Appearance: She is well-developed.  Eyes:     Conjunctiva/sclera: Conjunctivae normal.  Neck:     Comments: Bilateral arm strength is equal, symmetric. Decreased sensation bilaterally. Cardiovascular:     Rate and Rhythm: Normal rate and regular rhythm.     Pulses: Normal pulses.     Heart sounds: Normal heart sounds.  Pulmonary:     Effort: Pulmonary effort is normal.     Breath sounds: Normal breath sounds. No wheezing, rhonchi or rales.  Musculoskeletal:     Cervical back: No torticollis. Spinous process tenderness present. No pain with movement or muscular tenderness. Normal range of motion.  Skin:    General: Skin is warm and dry.   Neurological:     Mental Status: She is alert.  Psychiatric:        Speech: Speech normal.        Behavior: Behavior normal.        Thought Content: Thought content normal.        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  Reviewed previous MRI cervical spine 05/18/2020 which showed severe stenosis C3-C4.  She previously been seen by pain management with temporary relief with epidural injection.  She politely declines referral back to pain management at this time as she has upcoming foot surgery.  We agreed we could increase gabapentin from 200 mg 3 times daily to 300 mg 3 times daily.  She will monitor for excessive sedation.  She will continue Cymbalta 60 mg twice daily, meloxicam 7.5 daily as needed, baclofen 10 mg twice daily as needed, tramadol 50 mg twice daily as needed.  Close follow up.       Relevant Orders   B12 and Folate Panel     Other   Restless leg   Relevant Medications   gabapentin (NEURONTIN) 300 MG capsule     I have discontinued Yizel E. Malinoski's triazolam. I have also changed her gabapentin. Additionally, I am having her maintain her hydroxychloroquine, Melatonin, acetaminophen, Rinvoq, clindamycin, SUMAtriptan, propranolol, fexofenadine, DULoxetine, amLODipine, traMADol, meloxicam, nortriptyline, montelukast, albuterol, ondansetron, lubiprostone, cycloSPORINE, latanoprost, omeprazole, timolol, Premarin, and baclofen.   Meds ordered this encounter  Medications  . gabapentin (NEURONTIN) 300 MG capsule    Sig: Take 1 capsule (300 mg total) by mouth in the morning, at noon, and at bedtime.    Dispense:  260 capsule    Refill:  3    Order Specific Question:   Supervising Provider    Answer:   Crecencio Mc [[id number]]    Return precautions given.   Risks, benefits, and alternatives of the medications and treatment plan  prescribed today were  discussed, and patient expressed understanding.   Education regarding symptom management and diagnosis given to patient on AVS.  Continue to follow with Burnard Hawthorne, FNP for routine health maintenance.   [name] and I agreed with plan.   Mable Paris, FNP

## 2022-08-17 ENCOUNTER — Other Ambulatory Visit: Payer: Self-pay | Admitting: Family

## 2022-08-17 ENCOUNTER — Telehealth: Payer: Self-pay | Admitting: Family

## 2022-08-17 DIAGNOSIS — E041 Nontoxic single thyroid nodule: Secondary | ICD-10-CM

## 2022-08-17 DIAGNOSIS — E538 Deficiency of other specified B group vitamins: Secondary | ICD-10-CM

## 2022-08-17 LAB — B12 AND FOLATE PANEL
Folate: 9.4 ng/mL (ref >5.9)
Vitamin B-12: 229 pg/mL (ref 211–911)

## 2022-08-17 NOTE — Telephone Encounter (Signed)
Call [facility]  I sent in the wrong version of gabapentin yesterday.  She will need gabapentin 4 times daily. She had been on gabapentin '200mg'$  four times daily.   Please give New rx of gabapentin '300mg'$  four times daily  Please give 90 day suppply with refills

## 2022-08-18 ENCOUNTER — Other Ambulatory Visit (INDEPENDENT_AMBULATORY_CARE_PROVIDER_SITE_OTHER): Payer: Managed Care, Other (non HMO)

## 2022-08-18 DIAGNOSIS — E538 Deficiency of other specified B group vitamins: Secondary | ICD-10-CM

## 2022-08-18 DIAGNOSIS — E041 Nontoxic single thyroid nodule: Secondary | ICD-10-CM

## 2022-08-18 NOTE — Progress Notes (Signed)
Subjective:    Patient ID: EVOLETH NORDMEYER, female    DOB: May 24, 1966, 56 y.o.   MRN: [id number]  CC: JILLANE PO is a 56 y.o. female who presents today for follow up.   HPI: Chronic pain syndrome- compliant with tramadol 50 mg once daily, rarely she will take tramadol '50mg'$  bid.      She is compliant with Cymbalta 60 mg twice daily,meloxicam 7.5 mg, baclofen 10 mg BID prn, gabapentin '200mg'$  QID.  Overall regimen is helpful however she continues to experience numbness coming on both of her arms into her fingertips. Chronic neck pain .       She has not found gabapentin to be sedating     She has upcoming right foot surgery, suspected fusion, with Dr Vickki Muff.    MRI cervical spine 04/2020 with severe stenosis C3-C4 Previously saw Lonell Face with Dr Holley Raring 04/2020. She had epidural injection at that time with temporary relief   HTN- compliant with amlodipine 5 mg     HISTORY:  Past Medical History:  Diagnosis Date   Allergy    Arthritis, rheumatoid (HCC)    Degenerative disc disease, cervical    GERD (gastroesophageal reflux disease)    Gestational diabetes    patient denies   Glaucoma    H/O bronchitis    Headache    migraine   History of ovarian cyst    Hypertension    pre-eclampsia with first child   Increased pressure in the eye, bilateral    PONV (postoperative nausea and vomiting)    Restless leg    Shingles 2021   Past Surgical History:  Procedure Laterality Date   CERVICAL POLYPECTOMY N/A 04/11/2017   Procedure: CERVICAL POLYPECTOMY;  Surgeon: Rubie Maid, MD;  Location: [facility];  Service: Gynecology;  Laterality: N/A;   COLONOSCOPY WITH PROPOFOL N/A 03/24/2021   Procedure: COLONOSCOPY WITH BIOPSY;  Surgeon: Virgel Manifold, MD;  Location: [place];  Service: Endoscopy;  Laterality: N/A;   COLONOSCOPY WITH PROPOFOL N/A 09/18/2021   Procedure: COLONOSCOPY WITH PROPOFOL;  Surgeon: Virgel Manifold, MD;  Location: [facility];   Service: Endoscopy;  Laterality: N/A;   ESOPHAGOGASTRODUODENOSCOPY N/A 09/18/2021   Procedure: ESOPHAGOGASTRODUODENOSCOPY (EGD);  Surgeon: Virgel Manifold, MD;  Location: [facility];  Service: Endoscopy;  Laterality: N/A;   HYSTEROSCOPY WITH D & C N/A 04/11/2017   Procedure: DILATATION AND CURETTAGE /HYSTEROSCOPY;  Surgeon: Rubie Maid, MD;  Location: [facility];  Service: Gynecology;  Laterality: N/A;   IUD REMOVAL N/A 05/05/2017   Procedure: INTRAUTERINE DEVICE (IUD) REMOVAL;  Surgeon: Rubie Maid, MD;  Location: [facility];  Service: Gynecology;  Laterality: N/A;   LAPAROSCOPIC SALPINGO OOPHERECTOMY Bilateral 05/05/2017   Procedure: LAPAROSCOPIC BILATERAL SALPINGO OOPHORECTOMY;  Surgeon: Rubie Maid, MD;  Location: [facility];  Service: Gynecology;  Laterality: Bilateral;   LAPAROSCOPY N/A 04/11/2017   Procedure: LAPAROSCOPY DIAGNOSTIC;  Surgeon: Rubie Maid, MD;  Location: [facility];  Service: Gynecology;  Laterality: N/A;   PARTIAL HYSTERECTOMY     Ovaries and Tubes   POLYPECTOMY N/A 03/24/2021   Procedure: POLYPECTOMY;  Surgeon: Virgel Manifold, MD;  Location: [place];  Service: Endoscopy;  Laterality: N/A;   TUBAL LIGATION     Family History  Problem Relation Age of Onset   Schizophrenia Mother    Bipolar disorder Mother    Alcohol abuse Father    Hypertension Father    Hypertension Brother    Breast cancer Brother    Hypertension Maternal  Grandmother    Seizures Paternal Grandmother    Diabetes Paternal Grandmother    Thyroid cancer Neg Hx     Allergies: Dried figs [ficus], Flonase [fluticasone propionate], Sulfa antibiotics, and Tussionex pennkinetic er [hydrocod poli-chlorphe poli er] Current Outpatient Medications on File Prior to Visit  Medication Sig Dispense Refill   acetaminophen (TYLENOL) 650 MG CR tablet Take 650 mg by mouth every 8 (eight) hours as needed for pain.     albuterol (VENTOLIN HFA) 108 (90 Base) MCG/ACT inhaler Inhale 1-2 puffs into  the lungs as needed. 18 g 0   amLODipine (NORVASC) 5 MG tablet Take 1 tablet (5 mg total) by mouth daily. 90 tablet 3   baclofen (LIORESAL) 10 MG tablet Take 1 tablet (10 mg total) by mouth 2 (two) times daily. 90 tablet 0   clindamycin (CLINDAGEL) 1 % gel Apply topically 2 (two) times daily. 30 g 4   conjugated estrogens (PREMARIN) vaginal cream Place 1 Applicatorful vaginally 3 (three) times a week. Use daily for 1 month, then decrease to 3 times weekly. 42.5 g 12   cycloSPORINE (RESTASIS) 0.05 % ophthalmic emulsion SMARTSIG:In Eye(s)     DULoxetine (CYMBALTA) 60 MG capsule Take 1 capsule (60 mg total) by mouth 2 (two) times daily. 180 capsule 1   fexofenadine (ALLEGRA ALLERGY) 180 MG tablet Take 1 tablet (180 mg total) by mouth daily. 90 tablet 1   hydroxychloroquine (PLAQUENIL) 200 MG tablet Take 200 mg by mouth 2 (two) times daily.     latanoprost (XALATAN) 0.005 % ophthalmic solution 1 drop at bedtime.     lubiprostone (AMITIZA) 8 MCG capsule TAKE 1 CAPSULE BY MOUTH TWICE DAILY WITH A MEAL 60 capsule 5   Melatonin 5 MG CAPS Take by mouth.     meloxicam (MOBIC) 7.5 MG tablet TAKE 1 TABLET DAILY WITH FOOD AS NEEDED FOR PAIN 90 tablet 3   montelukast (SINGULAIR) 10 MG tablet TAKE 1 TABLET AT BEDTIME 90 tablet 3   nortriptyline (PAMELOR) 10 MG capsule TAKE 1 CAPSULE AT BEDTIME 90 capsule 3   omeprazole (PRILOSEC) 20 MG capsule Take 20 mg by mouth daily.     ondansetron (ZOFRAN-ODT) 8 MG disintegrating tablet PLACE 1 TABLET ON THE TONGUE TWICE A DAY AS NEEDED FOR NAUSEA OR VOMITING AND MIGRAINE 30 tablet 23   propranolol (INDERAL) 40 MG tablet Take 1 tablet (40 mg total) by mouth 2 (two) times daily. 180 tablet 1   RINVOQ 15 MG TB24 Take 1 tablet by mouth daily.     SUMAtriptan (IMITREX) 50 MG tablet TAKE 1 TABLET BY MOUTH ONCE FOR 1 DOSE. MAY REPEAT IN 2 HOURS IF HEADACHE PERSISTS OR RECURS 30 tablet 1   timolol (TIMOPTIC) 0.25 % ophthalmic solution      traMADol (ULTRAM) 50 MG tablet Take 1  tablet (50 mg total) by mouth every 12 (twelve) hours as needed. 60 tablet 2   No current facility-administered medications on file prior to visit.    Social History   Tobacco Use   Smoking status: Former    Packs/day: 1.00    Years: 6.00    Total pack years: 6.00    Types: Cigarettes    Quit date: 05/29/2000    Years since quitting: 22.2   Smokeless tobacco: Never   Tobacco comments:    1/2-1 PPD  Vaping Use   Vaping Use: Never used  Substance Use Topics   Alcohol use: No    Alcohol/week: 0.0 standard drinks of alcohol  Drug use: No    Review of Systems  Constitutional:  Negative for chills and fever.  Respiratory:  Negative for cough.   Cardiovascular:  Negative for chest pain and palpitations.  Gastrointestinal:  Negative for nausea and vomiting.  Musculoskeletal:  Positive for neck pain.  Neurological:  Positive for numbness.      Objective:    BP 138/88 (BP Location: Left Arm, Patient Position: Sitting, Cuff Size: Normal)   Pulse 62   Temp 97.9 F (36.6 C) (Oral)   Ht 5' 2.5" (1.588 m)   Wt 255 lb (115.7 kg)   LMP 05/06/2017 (Exact Date) Comment: BSO  SpO2 96%   BMI 45.90 kg/m  BP Readings from Last 3 Encounters:  08/16/22 138/88  07/29/22 (!) 144/86  04/12/22 99/64   Wt Readings from Last 3 Encounters:  08/16/22 255 lb (115.7 kg)  07/29/22 253 lb 8 oz (115 kg)  04/12/22 252 lb 4 oz (114.4 kg)    Physical Exam Vitals reviewed.  Constitutional:      Appearance: She is well-developed.  Eyes:     Conjunctiva/sclera: Conjunctivae normal.  Neck:   Cardiovascular:     Rate and Rhythm: Normal rate and regular rhythm.     Pulses: Normal pulses.     Heart sounds: Normal heart sounds.  Pulmonary:     Effort: Pulmonary effort is normal.     Breath sounds: Normal breath sounds. No wheezing, rhonchi or rales.  Musculoskeletal:     Cervical back: No torticollis. Spinous process tenderness and muscular tenderness present. No pain with movement. Normal  range of motion.  Skin:    General: Skin is warm and dry.  Neurological:     Mental Status: She is alert.  Psychiatric:        Speech: Speech normal.        Behavior: Behavior normal.        Thought Content: Thought content normal.        Assessment & Plan:   Problem List Items Addressed This Visit       Cardiovascular and Mediastinum   Hypertension    Chronic, stable. Continue amlodipine '5mg'$ .         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  Reviewed previous MRI cervical spine 05/18/2020 which showed severe stenosis C3-C4.  She was previously been seen by pain management with temporary relief with epidural injection.  She politely declines referral back to pain management at this time as she has upcoming foot surgery.  We agreed we could increase gabapentin from 200 mg 4 times daily to 300 mg 4 times daily.  She will monitor for excessive sedation.  She will continue Cymbalta 60 mg twice daily, meloxicam 7.5 daily as needed, baclofen 10 mg twice daily as needed, tramadol 50 mg twice daily as needed.  Close follow up.       Relevant Orders   B12 and Folate Panel (Completed)     Other   Restless leg   Relevant Medications   gabapentin (NEURONTIN) 300 MG capsule     I have discontinued Brisa E. Georg's triazolam. I have also changed her gabapentin. Additionally, I am having her maintain her hydroxychloroquine, Melatonin, acetaminophen, Rinvoq, clindamycin, SUMAtriptan, propranolol, fexofenadine, DULoxetine, amLODipine, traMADol, meloxicam, nortriptyline, montelukast, albuterol, ondansetron, lubiprostone, cycloSPORINE, latanoprost, omeprazole, timolol, Premarin, and baclofen.   Meds ordered this encounter  Medications   gabapentin (NEURONTIN) 300 MG capsule    Sig:  Take 1 capsule (300 mg total) by mouth in the morning, at noon, and at bedtime.    Dispense:  260 capsule    Refill:  3    Order  Specific Question:   Supervising Provider    Answer:   Crecencio Mc [[id number]]    Return precautions given.   Risks, benefits, and alternatives of the medications and treatment plan prescribed today were discussed, and patient expressed understanding.   Education regarding symptom management and diagnosis given to patient on AVS.  Continue to follow with Burnard Hawthorne, FNP for routine health maintenance.   [name] and I agreed with plan.   Mable Paris, FNP

## 2022-08-19 LAB — TSH: TSH: 1.12 u[IU]/mL (ref 0.35–5.50)

## 2022-08-19 LAB — T3, FREE: T3, Free: 3.4 pg/mL (ref 2.3–4.2)

## 2022-08-19 LAB — THYROTROPIN RECEPTOR AUTOABS: Thyrotropin Receptor Ab: <1.1 IU/L (ref 0.00–1.75)

## 2022-08-19 LAB — T4, FREE: Free T4: 0.93 ng/dL (ref 0.60–1.60)

## 2022-08-22 LAB — METHYLMALONIC ACID, SERUM: Methylmalonic Acid, Quant: 116 nmol/L (ref 87–318)

## 2022-08-22 LAB — CELIAC DISEASE AB SCREEN W/RFX
Antigliadin Abs, IgA: 5 units (ref 0–19)
IgA/Immunoglobulin A, Serum: 137 mg/dL (ref 87–352)
Transglutaminase IgA: <2 U/mL (ref 0–3)

## 2022-08-22 LAB — INTRINSIC FACTOR ANTIBODIES: Intrinsic Factor: NEGATIVE

## 2022-08-22 LAB — HOMOCYSTEINE: Homocysteine: 13.4 umol/L — ABNORMAL HIGH (ref <10.4)

## 2022-08-23 ENCOUNTER — Encounter: Payer: Self-pay | Admitting: Family

## 2022-08-23 DIAGNOSIS — E538 Deficiency of other specified B group vitamins: Secondary | ICD-10-CM | POA: Insufficient documentation

## 2022-08-24 ENCOUNTER — Other Ambulatory Visit: Payer: Self-pay

## 2022-08-24 ENCOUNTER — Encounter: Payer: Self-pay | Admitting: Family

## 2022-08-24 DIAGNOSIS — G43809 Other migraine, not intractable, without status migrainosus: Secondary | ICD-10-CM

## 2022-08-24 MED ORDER — SUMATRIPTAN SUCCINATE 50 MG PO TABS: ORAL_TABLET | ORAL | 2 refills | Status: DC

## 2022-08-25 NOTE — Telephone Encounter (Signed)
Spoke  to [place] at [facility] and was able to discontinue the previous order for Gabapentin 200 mg 4 x's per day( per Joycelyn Schmid) and give the verbal order over the phone for the new RX of Gabapentin 300 mg 4 x's per day 90 day supply with refills

## 2022-08-27 ENCOUNTER — Ambulatory Visit: Payer: Managed Care, Other (non HMO) | Admitting: Internal Medicine

## 2022-08-27 ENCOUNTER — Encounter: Payer: Self-pay | Admitting: Internal Medicine

## 2022-08-27 DIAGNOSIS — J0100 Acute maxillary sinusitis, unspecified: Secondary | ICD-10-CM | POA: Diagnosis not present

## 2022-08-27 DIAGNOSIS — I10 Essential (primary) hypertension: Secondary | ICD-10-CM | POA: Diagnosis not present

## 2022-08-27 DIAGNOSIS — L03019 Cellulitis of unspecified finger: Secondary | ICD-10-CM

## 2022-08-27 MED ORDER — AMOXICILLIN-POT CLAVULANATE 875-125 MG PO TABS: 1.0000 | ORAL_TABLET | Freq: Two times a day (BID) | ORAL | 0 refills | Status: DC

## 2022-08-27 MED ORDER — FLUCONAZOLE 150 MG PO TABS: ORAL_TABLET | ORAL | 0 refills | Status: DC

## 2022-08-27 NOTE — Patient Instructions (Signed)
Take the augmentin twice a day.   Probiotic (or yogurt) daily while on the antibiotics and for two weeks after completing the antibiotics.   Saline nasal spray - flush nose at least 2x/day  Soak finger.

## 2022-08-27 NOTE — Progress Notes (Unsigned)
Patient ID: Yvonne [name], female   DOB: Jan 18, 1966, 56 y.o.   MRN: [id number]   Subjective:    Patient ID: Yvonne [name], female    DOB: 05/16/1966, 56 y.o.   MRN: [id number]   Patient here for work in appt   HPI Work in with concerns regarding ear fullness.  Has noticed a flutter in her ear.  Right ear.  States that starting approximately 5 days ago, noticed full sensation in hear.  Occasional popping.  Then developed cold chills and increased sinus drainage.  Green mucus production. Increased sinus pressure.  Feels like her previous sinus infections.  Occasional wheezing.  Occasional nausea.  Decreased appetite.  No vomiting.  No chest pain or sob.  No increased cough.    Past Medical History:  Diagnosis Date   Allergy    Arthritis, rheumatoid (HCC)    Degenerative disc disease, cervical    GERD (gastroesophageal reflux disease)    Gestational diabetes    patient denies   Glaucoma    H/O bronchitis    Headache    migraine   History of ovarian cyst    Hypertension    pre-eclampsia with first child   Increased pressure in the eye, bilateral    PONV (postoperative nausea and vomiting)    Restless leg    Shingles 2021   Past Surgical History:  Procedure Laterality Date   CERVICAL POLYPECTOMY N/A 04/11/2017   Procedure: CERVICAL POLYPECTOMY;  Surgeon: Rubie Maid, MD;  Location: [facility];  Service: Gynecology;  Laterality: N/A;   COLONOSCOPY WITH PROPOFOL N/A 03/24/2021   Procedure: COLONOSCOPY WITH BIOPSY;  Surgeon: Virgel Manifold, MD;  Location: [place];  Service: Endoscopy;  Laterality: N/A;   COLONOSCOPY WITH PROPOFOL N/A 09/18/2021   Procedure: COLONOSCOPY WITH PROPOFOL;  Surgeon: Virgel Manifold, MD;  Location: [facility];  Service: Endoscopy;  Laterality: N/A;   ESOPHAGOGASTRODUODENOSCOPY N/A 09/18/2021   Procedure: ESOPHAGOGASTRODUODENOSCOPY (EGD);  Surgeon: Virgel Manifold, MD;  Location: [facility];  Service: Endoscopy;   Laterality: N/A;   HYSTEROSCOPY WITH D & C N/A 04/11/2017   Procedure: DILATATION AND CURETTAGE /HYSTEROSCOPY;  Surgeon: Rubie Maid, MD;  Location: [facility];  Service: Gynecology;  Laterality: N/A;   IUD REMOVAL N/A 05/05/2017   Procedure: INTRAUTERINE DEVICE (IUD) REMOVAL;  Surgeon: Rubie Maid, MD;  Location: [facility];  Service: Gynecology;  Laterality: N/A;   LAPAROSCOPIC SALPINGO OOPHERECTOMY Bilateral 05/05/2017   Procedure: LAPAROSCOPIC BILATERAL SALPINGO OOPHORECTOMY;  Surgeon: Rubie Maid, MD;  Location: [facility];  Service: Gynecology;  Laterality: Bilateral;   LAPAROSCOPY N/A 04/11/2017   Procedure: LAPAROSCOPY DIAGNOSTIC;  Surgeon: Rubie Maid, MD;  Location: [facility];  Service: Gynecology;  Laterality: N/A;   PARTIAL HYSTERECTOMY     Ovaries and Tubes   POLYPECTOMY N/A 03/24/2021   Procedure: POLYPECTOMY;  Surgeon: Virgel Manifold, MD;  Location: [place];  Service: Endoscopy;  Laterality: N/A;   TUBAL LIGATION     Family History  Problem Relation Age of Onset   Schizophrenia Mother    Bipolar disorder Mother    Alcohol abuse Father    Hypertension Father    Hypertension Brother    Breast cancer Brother    Hypertension Maternal Grandmother    Seizures Paternal Grandmother    Diabetes Paternal Grandmother    Thyroid cancer Neg Hx    Social History   Socioeconomic History   Marital status: Married    Spouse name: Not on file   Number of  children: Not on file   Years of education: Not on file   Highest education level: Not on file  Occupational History   Occupation: Chemical engineer at [employer]  Tobacco Use   Smoking status: Former    Packs/day: 1.00    Years: 6.00    Total pack years: 6.00    Types: Cigarettes    Quit date: 05/29/2000    Years since quitting: 22.2   Smokeless tobacco: Never   Tobacco comments:    1/2-1 PPD  Vaping Use   Vaping Use: Never used  Substance and Sexual Activity   Alcohol use: No    Alcohol/week:  0.0 standard drinks of alcohol   Drug use: No   Sexual activity: Yes    Birth control/protection: Surgical  Other Topics Concern   Not on file  Social History Narrative   Working as Research scientist (physical sciences) at [employer]   Financial Resource Strain: Not on file  Food Insecurity: Not on file  Transportation Needs: Not on file  Physical Activity: Not on file  Stress: Not on file  Social Connections: Not on file     Review of Systems  Constitutional:  Positive for appetite change. Negative for unexpected weight change.  HENT:  Positive for congestion, postnasal drip and sinus pressure.        Ear fullness as outlined.   Respiratory:  Negative for cough, chest tightness and shortness of breath.   Cardiovascular:  Negative for chest pain, palpitations and leg swelling.  Gastrointestinal:  Negative for abdominal pain, diarrhea and vomiting.  Genitourinary:  Negative for difficulty urinating and dysuria.  Musculoskeletal:  Negative for joint swelling and myalgias.  Skin:  Negative for color change and rash.  Neurological:  Negative for dizziness and headaches.  Psychiatric/Behavioral:  Negative for agitation and dysphoric mood.        Objective:     BP 134/82 (BP Location: Left Arm, Patient Position: Sitting, Cuff Size: Large)   Pulse 60   Temp 97.6 F (36.4 C) (Oral)   Ht '5\' 2"'$  (1.575 m)   Wt 255 lb 12.8 oz (116 kg)   LMP 05/06/2017 (Exact Date) Comment: BSO  SpO2 96%   BMI 46.79 kg/m  Wt Readings from Last 3 Encounters:  08/27/22 255 lb 12.8 oz (116 kg)  08/16/22 255 lb (115.7 kg)  07/29/22 253 lb 8 oz (115 kg)    Physical Exam Vitals reviewed.  Constitutional:      General: She is not in acute distress.    Appearance: Normal appearance.  HENT:     Head: Normocephalic and atraumatic.     Right Ear: Tympanic membrane and external ear normal.     Left Ear: Tympanic membrane, ear canal and external ear normal.  Eyes:     General: No  scleral icterus.       Right eye: No discharge.        Left eye: No discharge.     Conjunctiva/sclera: Conjunctivae normal.  Neck:     Thyroid: No thyromegaly.  Cardiovascular:     Rate and Rhythm: Normal rate and regular rhythm.  Pulmonary:     Effort: No respiratory distress.     Breath sounds: Normal breath sounds. No wheezing.  Abdominal:     General: Bowel sounds are normal.     Palpations: Abdomen is soft.     Tenderness: There is no abdominal tenderness.  Musculoskeletal:        General:  No swelling or tenderness.     Cervical back: Neck supple. No tenderness.  Lymphadenopathy:     Cervical: No cervical adenopathy.  Skin:    Findings: No erythema or rash.  Neurological:     Mental Status: She is alert.  Psychiatric:        Mood and Affect: Mood normal.        Behavior: Behavior normal.      Outpatient Encounter Medications as of 08/27/2022  Medication Sig   acetaminophen (TYLENOL) 650 MG CR tablet Take 650 mg by mouth every 8 (eight) hours as needed for pain.   albuterol (VENTOLIN HFA) 108 (90 Base) MCG/ACT inhaler Inhale 1-2 puffs into the lungs as needed.   amLODipine (NORVASC) 5 MG tablet Take 1 tablet (5 mg total) by mouth daily.   amoxicillin-clavulanate (AUGMENTIN) 875-125 MG tablet Take 1 tablet by mouth 2 (two) times daily.   baclofen (LIORESAL) 10 MG tablet Take 1 tablet (10 mg total) by mouth 2 (two) times daily.   clindamycin (CLINDAGEL) 1 % gel Apply topically 2 (two) times daily.   conjugated estrogens (PREMARIN) vaginal cream Place 1 Applicatorful vaginally 3 (three) times a week. Use daily for 1 month, then decrease to 3 times weekly.   cycloSPORINE (RESTASIS) 0.05 % ophthalmic emulsion SMARTSIG:In Eye(s)   DULoxetine (CYMBALTA) 60 MG capsule Take 1 capsule (60 mg total) by mouth 2 (two) times daily.   fexofenadine (ALLEGRA ALLERGY) 180 MG tablet Take 1 tablet (180 mg total) by mouth daily.   fluconazole (DIFLUCAN) 150 MG tablet Take one tablet x 1.    gabapentin (NEURONTIN) 300 MG capsule Take 1 capsule (300 mg total) by mouth in the morning, at noon, and at bedtime.   hydroxychloroquine (PLAQUENIL) 200 MG tablet Take 200 mg by mouth 2 (two) times daily.   latanoprost (XALATAN) 0.005 % ophthalmic solution 1 drop at bedtime.   lubiprostone (AMITIZA) 8 MCG capsule TAKE 1 CAPSULE BY MOUTH TWICE DAILY WITH A MEAL   Melatonin 5 MG CAPS Take by mouth.   meloxicam (MOBIC) 7.5 MG tablet TAKE 1 TABLET DAILY WITH FOOD AS NEEDED FOR PAIN   montelukast (SINGULAIR) 10 MG tablet TAKE 1 TABLET AT BEDTIME   nortriptyline (PAMELOR) 10 MG capsule TAKE 1 CAPSULE AT BEDTIME   omeprazole (PRILOSEC) 20 MG capsule Take 20 mg by mouth daily.   ondansetron (ZOFRAN-ODT) 8 MG disintegrating tablet PLACE 1 TABLET ON THE TONGUE TWICE A DAY AS NEEDED FOR NAUSEA OR VOMITING AND MIGRAINE   propranolol (INDERAL) 40 MG tablet Take 1 tablet (40 mg total) by mouth 2 (two) times daily.   RINVOQ 15 MG TB24 Take 1 tablet by mouth daily.   SUMAtriptan (IMITREX) 50 MG tablet TAKE 1 TABLET BY MOUTH ONCE FOR 1 DOSE. MAY REPEAT IN 2 HOURS IF HEADACHE PERSISTS OR RECURS   timolol (TIMOPTIC) 0.25 % ophthalmic solution    traMADol (ULTRAM) 50 MG tablet Take 1 tablet (50 mg total) by mouth every 12 (twelve) hours as needed.   No facility-administered encounter medications on file as of 08/27/2022.     Lab Results  Component Value Date   WBC 2.6 (L) 11/10/2021   HGB 12.2 11/10/2021   HCT 36.7 11/10/2021   PLT 124 (L) 11/10/2021   GLUCOSE 114 (H) 11/10/2021   CHOL 244 (H) 01/14/2021   TRIG 184 (H) 01/14/2021   HDL 44 01/14/2021   LDLCALC 166 (H) 01/14/2021   ALT 21 05/14/2021   AST 14 05/14/2021   NA  138 11/10/2021   K 4.1 11/10/2021   CL 104 11/10/2021   CREATININE 0.65 11/10/2021   BUN 18 11/10/2021   CO2 29 11/10/2021   TSH 1.12 08/18/2022       Assessment & Plan:   Problem List Items Addressed This Visit     Hypertension    Blood pressure as outlined. On  amlodipine.        Paronychia of finger    Discussed soaks - epsom salts.  Prescribed augmentin for sinus infection.  Follow.  Call with update.       Relevant Medications   fluconazole (DIFLUCAN) 150 MG tablet   Sinusitis    Ear fullness and increased sinus pressure and congestion as outlined. Ear exam - no erythema.   Symptoms appear to be c/w sinus infection.  Feels similar to previous sinus infection.  Saline nasal spray as outlined.  Taking allegra.  augmentin '875mg'$  bid.  Probiotics as directed.  Follow.  Call with update.        Relevant Medications   amoxicillin-clavulanate (AUGMENTIN) 875-125 MG tablet   fluconazole (DIFLUCAN) 150 MG tablet     Einar Pheasant, MD

## 2022-08-30 ENCOUNTER — Other Ambulatory Visit: Payer: Self-pay

## 2022-08-30 ENCOUNTER — Ambulatory Visit
Admission: RE | Admit: 2022-08-30 | Discharge: 2022-08-30 | Disposition: A | Discharge status: Home / Self Care | Payer: Managed Care, Other (non HMO) | Source: Ambulatory Visit | Attending: Obstetrics and Gynecology | Admitting: Obstetrics and Gynecology

## 2022-08-30 ENCOUNTER — Encounter: Payer: Self-pay | Admitting: Internal Medicine

## 2022-08-30 DIAGNOSIS — Z01419 Encounter for gynecological examination (general) (routine) without abnormal findings: Secondary | ICD-10-CM | POA: Insufficient documentation

## 2022-08-30 DIAGNOSIS — Z1231 Encounter for screening mammogram for malignant neoplasm of breast: Secondary | ICD-10-CM | POA: Insufficient documentation

## 2022-08-30 DIAGNOSIS — L03019 Cellulitis of unspecified finger: Secondary | ICD-10-CM | POA: Insufficient documentation

## 2022-08-30 MED ORDER — LUBIPROSTONE 8 MCG PO CAPS: ORAL_CAPSULE | ORAL | 2 refills | Status: DC

## 2022-08-30 NOTE — Assessment & Plan Note (Signed)
Blood pressure as outlined. On amlodipine.

## 2022-08-30 NOTE — Telephone Encounter (Signed)
Med[facility]. [facility]

## 2022-08-30 NOTE — Assessment & Plan Note (Signed)
Ear fullness and increased sinus pressure and congestion as outlined. Ear exam - no erythema.   Symptoms appear to be c/w sinus infection.  Feels similar to previous sinus infection.  Saline nasal spray as outlined.  Taking allegra.  augmentin '875mg'$  bid.  Probiotics as directed.  Follow.  Call with update.

## 2022-08-30 NOTE — Assessment & Plan Note (Signed)
Discussed soaks - epsom salts.  Prescribed augmentin for sinus infection.  Follow.  Call with update.

## 2022-08-31 ENCOUNTER — Other Ambulatory Visit: Payer: Self-pay | Admitting: Podiatry

## 2022-09-01 ENCOUNTER — Encounter: Payer: Self-pay | Admitting: *Deleted

## 2022-09-01 ENCOUNTER — Encounter
Admission: RE | Admit: 2022-09-01 | Discharge: 2022-09-01 | Disposition: A | Discharge status: Home / Self Care | Payer: Managed Care, Other (non HMO) | Source: Ambulatory Visit | Attending: Podiatry | Admitting: Podiatry

## 2022-09-01 DIAGNOSIS — Z01818 Encounter for other preprocedural examination: Secondary | ICD-10-CM

## 2022-09-01 DIAGNOSIS — I10 Essential (primary) hypertension: Secondary | ICD-10-CM

## 2022-09-01 HISTORY — DX: Nontoxic single thyroid nodule: E04.1

## 2022-09-01 NOTE — Patient Instructions (Signed)
Your procedure is scheduled on:09-10-22 Friday Report to the Registration Desk on the 1st floor of the [facility].Then proceed to the 2nd floor Surgery Desk To find out your arrival time, please call [phone] between 1PM - 3PM on:09-09-22 Thursday If your arrival time is 6:00 am, do not arrive prior to that time as the [facility] entrance doors do not open until 6:00 am.  REMEMBER: Instructions that are not followed completely may result in serious medical risk, up to and including death; or upon the discretion of your surgeon and anesthesiologist your surgery may need to be rescheduled.  Do not eat food after midnight the night before surgery.  No gum chewing, lozengers or hard candies.  You may however, drink CLEAR liquids up to 2 hours before you are scheduled to arrive for your surgery. Do not drink anything within 2 hours of your scheduled arrival time.  Clear liquids include: - water  - apple juice without pulp - gatorade (not RED colors) - black coffee or tea (Do NOT add milk or creamers to the coffee or tea) Do NOT drink anything that is not on this list.  In addition, your doctor has ordered for you to drink the provided  Ensure Pre-Surgery Clear Carbohydrate Drink  Drinking this carbohydrate drink up to two hours before surgery helps to reduce insulin resistance and improve patient outcomes. Please complete drinking 2 hours prior to scheduled arrival time.  TAKE THESE MEDICATIONS THE MORNING OF SURGERY WITH A SIP OF WATER: -amLODipine (NORVASC) -baclofen (LIORESAL)  -DULoxetine (CYMBALTA) -fexofenadine (ALLEGRA ALLERGY) -gabapentin (NEURONTIN) -propranolol (INDERAL)  -omeprazole (PRILOSEC)-take one the night before and one on the morning of surgery - helps to prevent nausea after surgery.)  Bring your Albuterol Inhaler to the hospital the day of surgery  One week prior to surgery: Stop Anti-inflammatories (NSAIDS) such as meloxicam (MOBIC), Advil, Aleve,  Ibuprofen, Motrin, Naproxen, Naprosyn and Aspirin based products such as Excedrin, Goodys Powder, BC Powder.You may however, continue to take Tylenol/Tramadol if needed for pain up until the day of surgery.  Stop ANY OVER THE COUNTER supplements/vitamins NOW (09-01-22) until after surgery-Ok to continue Melatonin up until the night prior to surgery  No Alcohol for 24 hours before or after surgery.  No Smoking including e-cigarettes for 24 hours prior to surgery.  No chewable tobacco products for at least 6 hours prior to surgery.  No nicotine patches on the day of surgery.  Do not use any "recreational" drugs for at least a week prior to your surgery.  Please be advised that the combination of cocaine and anesthesia may have negative outcomes, up to and including death. If you test positive for cocaine, your surgery will be cancelled.  On the morning of surgery brush your teeth with toothpaste and water, you may rinse your mouth with mouthwash if you wish. Do not swallow any toothpaste or mouthwash.  Use CHG Soap as directed on instruction sheet.  Do not wear jewelry, make-up, hairpins, clips or nail polish.  Do not wear lotions, powders, or perfumes.   Do not shave body from the neck down 48 hours prior to surgery just in case you cut yourself which could leave a site for infection.  Also, freshly shaved skin may become irritated if using the CHG soap.  Contact lenses, hearing aids and dentures may not be worn into surgery.  Do not bring valuables to the hospital. [facility] is not responsible for any missing/lost belongings or valuables.   Notify your  doctor if there is any change in your medical condition (cold, fever, infection).  Wear comfortable clothing (specific to your surgery type) to the hospital.  After surgery, you can help prevent lung complications by doing breathing exercises.  Take deep breaths and cough every 1-2 hours. Your doctor may order a device called an  Incentive Spirometer to help you take deep breaths. When coughing or sneezing, hold a pillow firmly against your incision with both hands. This is called "splinting." Doing this helps protect your incision. It also decreases belly discomfort.  If you are being admitted to the hospital overnight, leave your suitcase in the car. After surgery it may be brought to your room.  If you are being discharged the day of surgery, you will not be allowed to drive home. You will need a responsible adult (18 years or older) to drive you home and stay with you that night.   If you are taking public transportation, you will need to have a responsible adult (18 years or older) with you. Please confirm with your physician that it is acceptable to use public transportation.   Please call the [facility] at [phone] if you have any questions about these instructions.  Surgery Visitation Policy:  Patients undergoing a surgery or procedure may have two family members or support persons with them as long as the person is not COVID-19 positive or experiencing its symptoms.     How to Use an Incentive Spirometer An incentive spirometer is a tool that measures how well you are filling your lungs with each breath. Learning to take long, deep breaths using this tool can help you keep your lungs clear and active. This may help to reverse or lessen your chance of developing breathing (pulmonary) problems, especially infection. You may be asked to use a spirometer: After a surgery. If you have a lung problem or a history of smoking. After a long period of time when you have been unable to move or be active. If the spirometer includes an indicator to show the highest number that you have reached, your health care provider or respiratory therapist will help you set a goal. Keep a log of your progress as told by your health care provider. What are the risks? Breathing too quickly may cause dizziness  or cause you to pass out. Take your time so you do not get dizzy or light-headed. If you are in pain, you may need to take pain medicine before doing incentive spirometry. It is harder to take a deep breath if you are having pain. How to use your incentive spirometer  Sit up on the edge of your bed or on a chair. Hold the incentive spirometer so that it is in an upright position. Before you use the spirometer, breathe out normally. Place the mouthpiece in your mouth. Make sure your lips are closed tightly around it. Breathe in slowly and as deeply as you can through your mouth, causing the piston or the ball to rise toward the top of the chamber. Hold your breath for 3-5 seconds, or for as long as possible. If the spirometer includes a coach indicator, use this to guide you in breathing. Slow down your breathing if the indicator goes above the marked areas. Remove the mouthpiece from your mouth and breathe out normally. The piston or ball will return to the bottom of the chamber. Rest for a few seconds, then repeat the steps 10 or more times. Take your time and take  a few normal breaths between deep breaths so that you do not get dizzy or light-headed. Do this every 1-2 hours when you are awake. If the spirometer includes a goal marker to show the highest number you have reached (best effort), use this as a goal to work toward during each repetition. After each set of 10 deep breaths, cough a few times. This will help to make sure that your lungs are clear. If you have an incision on your chest or abdomen from surgery, place a pillow or a rolled-up towel firmly against the incision when you cough. This can help to reduce pain while taking deep breaths and coughing. General tips When you are able to get out of bed: Walk around often. Continue to take deep breaths and cough in order to clear your lungs. Keep using the incentive spirometer until your health care provider says it is okay to stop  using it. If you have been in the hospital, you may be told to keep using the spirometer at home. Contact a health care provider if: You are having difficulty using the spirometer. You have trouble using the spirometer as often as instructed. Your pain medicine is not giving enough relief for you to use the spirometer as told. You have a fever. Get help right away if: You develop shortness of breath. You develop a cough with bloody mucus from the lungs. You have fluid or blood coming from an incision site after you cough. Summary An incentive spirometer is a tool that can help you learn to take long, deep breaths to keep your lungs clear and active. You may be asked to use a spirometer after a surgery, if you have a lung problem or a history of smoking, or if you have been inactive for a long period of time. Use your incentive spirometer as instructed every 1-2 hours while you are awake. If you have an incision on your chest or abdomen, place a pillow or a rolled-up towel firmly against your incision when you cough. This will help to reduce pain. Get help right away if you have shortness of breath, you cough up bloody mucus, or blood comes from your incision when you cough. This information is not intended to replace advice given to you by your health care provider. Make sure you discuss any questions you have with your health care provider. Document Revised: 02/04/2020 Document Reviewed: 02/04/2020 Elsevier Patient Education  [place].

## 2022-09-03 ENCOUNTER — Other Ambulatory Visit: Payer: Self-pay | Admitting: Family

## 2022-09-06 ENCOUNTER — Encounter
Admission: RE | Admit: 2022-09-06 | Discharge: 2022-09-06 | Disposition: A | Discharge status: Home / Self Care | Payer: Managed Care, Other (non HMO) | Source: Ambulatory Visit | Attending: Podiatry | Admitting: Podiatry

## 2022-09-06 ENCOUNTER — Encounter: Payer: Self-pay | Admitting: Family

## 2022-09-06 ENCOUNTER — Other Ambulatory Visit: Payer: Self-pay

## 2022-09-06 DIAGNOSIS — Z1152 Encounter for screening for COVID-19: Secondary | ICD-10-CM | POA: Diagnosis not present

## 2022-09-06 DIAGNOSIS — I10 Essential (primary) hypertension: Secondary | ICD-10-CM

## 2022-09-06 DIAGNOSIS — Z01818 Encounter for other preprocedural examination: Secondary | ICD-10-CM | POA: Diagnosis present

## 2022-09-06 DIAGNOSIS — Z20822 Contact with and (suspected) exposure to covid-19: Secondary | ICD-10-CM

## 2022-09-06 DIAGNOSIS — G894 Chronic pain syndrome: Secondary | ICD-10-CM

## 2022-09-06 LAB — CBC
HCT: 38.7 % (ref 36.0–46.0)
Hemoglobin: 13.2 g/dL (ref 12.0–15.0)
MCH: 30.2 pg (ref 26.0–34.0)
MCHC: 34.1 g/dL (ref 30.0–36.0)
MCV: 88.6 fL (ref 80.0–100.0)
Platelets: 214 10*3/uL (ref 150–400)
RBC: 4.37 MIL/uL (ref 3.87–5.11)
RDW: 12.7 % (ref 11.5–15.5)
WBC: 3.8 10*3/uL — ABNORMAL LOW (ref 4.0–10.5)
nRBC: 0 % (ref 0.0–0.2)

## 2022-09-06 LAB — BASIC METABOLIC PANEL
Anion gap: 6 (ref 5–15)
BUN: 18 mg/dL (ref 6–20)
CO2: 24 mmol/L (ref 22–32)
Calcium: 9.3 mg/dL (ref 8.9–10.3)
Chloride: 106 mmol/L (ref 98–111)
Creatinine, Ser: 0.76 mg/dL (ref 0.44–1.00)
GFR, Estimated: >60 mL/min (ref >60)
Glucose, Bld: 90 mg/dL (ref 70–99)
Potassium: 3.9 mmol/L (ref 3.5–5.1)
Sodium: 136 mmol/L (ref 135–145)

## 2022-09-06 MED ORDER — TRAMADOL HCL 50 MG PO TABS: 50.0000 mg | ORAL_TABLET | Freq: Two times a day (BID) | ORAL | 2 refills | Status: DC | PRN

## 2022-09-07 LAB — SARS CORONAVIRUS 2 (TAT 6-24 HRS): SARS Coronavirus 2: NEGATIVE

## 2022-09-08 ENCOUNTER — Other Ambulatory Visit: Payer: Self-pay

## 2022-09-08 DIAGNOSIS — G43809 Other migraine, not intractable, without status migrainosus: Secondary | ICD-10-CM

## 2022-09-08 MED ORDER — SUMATRIPTAN SUCCINATE 50 MG PO TABS: ORAL_TABLET | ORAL | 2 refills | Status: DC

## 2022-09-09 MED ORDER — CEFAZOLIN SODIUM-DEXTROSE 2-4 GM/100ML-% IV SOLN
2.0000 g | INTRAVENOUS | Status: AC
  Administered 2022-09-10: 2 g via INTRAVENOUS

## 2022-09-09 MED ORDER — CHLORHEXIDINE GLUCONATE 0.12 % MT SOLN: 15.0000 mL | Freq: Once | OROMUCOSAL | Status: AC

## 2022-09-09 MED ORDER — ORAL CARE MOUTH RINSE: 15.0000 mL | Freq: Once | OROMUCOSAL | Status: AC

## 2022-09-09 MED ORDER — LACTATED RINGERS IV SOLN: INTRAVENOUS | Status: DC

## 2022-09-10 ENCOUNTER — Other Ambulatory Visit: Payer: Self-pay

## 2022-09-10 ENCOUNTER — Other Ambulatory Visit: Payer: Self-pay | Admitting: Family

## 2022-09-10 ENCOUNTER — Ambulatory Visit: Payer: Managed Care, Other (non HMO)

## 2022-09-10 ENCOUNTER — Encounter: Payer: Self-pay | Admitting: Podiatry

## 2022-09-10 ENCOUNTER — Ambulatory Visit
Admission: RE | Admit: 2022-09-10 | Discharge: 2022-09-10 | Disposition: A | Discharge status: Home / Self Care | Payer: Managed Care, Other (non HMO) | Attending: Podiatry | Admitting: Podiatry

## 2022-09-10 ENCOUNTER — Ambulatory Visit: Payer: Managed Care, Other (non HMO) | Admitting: Certified Registered Nurse Anesthetist

## 2022-09-10 ENCOUNTER — Encounter
Admission: RE | Disposition: A | Discharge status: Home / Self Care | Payer: Self-pay | Source: Home / Self Care | Attending: Podiatry

## 2022-09-10 ENCOUNTER — Telehealth: Payer: Self-pay

## 2022-09-10 DIAGNOSIS — R519 Headache, unspecified: Secondary | ICD-10-CM | POA: Diagnosis not present

## 2022-09-10 DIAGNOSIS — Z6841 Body Mass Index (BMI) 40.0 and over, adult: Secondary | ICD-10-CM | POA: Diagnosis not present

## 2022-09-10 DIAGNOSIS — E119 Type 2 diabetes mellitus without complications: Secondary | ICD-10-CM | POA: Diagnosis not present

## 2022-09-10 DIAGNOSIS — M19072 Primary osteoarthritis, left ankle and foot: Secondary | ICD-10-CM | POA: Insufficient documentation

## 2022-09-10 DIAGNOSIS — I10 Essential (primary) hypertension: Secondary | ICD-10-CM | POA: Insufficient documentation

## 2022-09-10 DIAGNOSIS — M059 Rheumatoid arthritis with rheumatoid factor, unspecified: Secondary | ICD-10-CM | POA: Diagnosis not present

## 2022-09-10 DIAGNOSIS — G894 Chronic pain syndrome: Secondary | ICD-10-CM

## 2022-09-10 DIAGNOSIS — K219 Gastro-esophageal reflux disease without esophagitis: Secondary | ICD-10-CM | POA: Insufficient documentation

## 2022-09-10 HISTORY — PX: FOOT ARTHRODESIS: SHX1655

## 2022-09-10 SURGERY — FUSION, JOINT, FOOT
Anesthesia: General | Site: Foot | Laterality: Left

## 2022-09-10 MED ORDER — ROCURONIUM BROMIDE 100 MG/10ML IV SOLN
INTRAVENOUS | Status: DC | PRN
  Administered 2022-09-10: 30 mg via INTRAVENOUS
  Administered 2022-09-10: 70 mg via INTRAVENOUS
  Administered 2022-09-10: 20 mg via INTRAVENOUS

## 2022-09-10 MED ORDER — FENTANYL CITRATE (PF) 100 MCG/2ML IJ SOLN: 25.0000 ug | INTRAMUSCULAR | Status: DC | PRN

## 2022-09-10 MED ORDER — BUPIVACAINE HCL (PF) 0.5 % IJ SOLN
INTRAMUSCULAR | Status: AC
  Filled 2022-09-10: qty 30

## 2022-09-10 MED ORDER — OXYCODONE-ACETAMINOPHEN 5-325 MG PO TABS: 1.0000 | ORAL_TABLET | Freq: Four times a day (QID) | ORAL | 0 refills | Status: DC | PRN

## 2022-09-10 MED ORDER — ACETAMINOPHEN 10 MG/ML IV SOLN
INTRAVENOUS | Status: DC | PRN
  Administered 2022-09-10: 1000 mg via INTRAVENOUS

## 2022-09-10 MED ORDER — PROPOFOL 1000 MG/100ML IV EMUL
INTRAVENOUS | Status: AC
  Filled 2022-09-10: qty 100

## 2022-09-10 MED ORDER — ACETAMINOPHEN 10 MG/ML IV SOLN: 1000.0000 mg | Freq: Once | INTRAVENOUS | Status: DC | PRN

## 2022-09-10 MED ORDER — PROPOFOL 500 MG/50ML IV EMUL
INTRAVENOUS | Status: DC | PRN
  Administered 2022-09-10: 100 ug/kg/min via INTRAVENOUS

## 2022-09-10 MED ORDER — SCOPOLAMINE 1 MG/3DAYS TD PT72: 1.0000 | MEDICATED_PATCH | TRANSDERMAL | Status: DC

## 2022-09-10 MED ORDER — ONDANSETRON HCL 4 MG/2ML IJ SOLN
INTRAMUSCULAR | Status: AC
  Filled 2022-09-10: qty 2

## 2022-09-10 MED ORDER — ONDANSETRON HCL 4 MG/2ML IJ SOLN: 4.0000 mg | Freq: Four times a day (QID) | INTRAMUSCULAR | Status: DC | PRN

## 2022-09-10 MED ORDER — BUPIVACAINE LIPOSOME 1.3 % IJ SUSP
INTRAMUSCULAR | Status: DC | PRN
  Administered 2022-09-10: 20 mL

## 2022-09-10 MED ORDER — DEXAMETHASONE SODIUM PHOSPHATE 10 MG/ML IJ SOLN
INTRAMUSCULAR | Status: AC
  Filled 2022-09-10: qty 1

## 2022-09-10 MED ORDER — CHLORHEXIDINE GLUCONATE 0.12 % MT SOLN
OROMUCOSAL | Status: AC
  Administered 2022-09-10: 15 mL via OROMUCOSAL
  Filled 2022-09-10: qty 15

## 2022-09-10 MED ORDER — SUGAMMADEX SODIUM 500 MG/5ML IV SOLN
INTRAVENOUS | Status: DC | PRN
  Administered 2022-09-10: 232 mg via INTRAVENOUS

## 2022-09-10 MED ORDER — CEFAZOLIN SODIUM-DEXTROSE 2-4 GM/100ML-% IV SOLN
INTRAVENOUS | Status: AC
  Filled 2022-09-10: qty 100

## 2022-09-10 MED ORDER — ALBUTEROL SULFATE HFA 108 (90 BASE) MCG/ACT IN AERS
INHALATION_SPRAY | RESPIRATORY_TRACT | Status: AC
  Filled 2022-09-10: qty 6.7

## 2022-09-10 MED ORDER — TRAMADOL HCL 50 MG PO TABS: 50.0000 mg | ORAL_TABLET | Freq: Two times a day (BID) | ORAL | 2 refills | Status: DC | PRN

## 2022-09-10 MED ORDER — FENTANYL CITRATE (PF) 100 MCG/2ML IJ SOLN
INTRAMUSCULAR | Status: AC
  Filled 2022-09-10: qty 2

## 2022-09-10 MED ORDER — BUPIVACAINE LIPOSOME 1.3 % IJ SUSP
INTRAMUSCULAR | Status: DC | PRN
  Administered 2022-09-10: 10 mL

## 2022-09-10 MED ORDER — FENTANYL CITRATE (PF) 100 MCG/2ML IJ SOLN
INTRAMUSCULAR | Status: AC
  Administered 2022-09-10: 50 ug via INTRAVENOUS
  Filled 2022-09-10: qty 2

## 2022-09-10 MED ORDER — FENTANYL CITRATE (PF) 100 MCG/2ML IJ SOLN
INTRAMUSCULAR | Status: DC | PRN
  Administered 2022-09-10: 100 ug via INTRAVENOUS

## 2022-09-10 MED ORDER — OXYCODONE HCL 5 MG PO TABS
ORAL_TABLET | ORAL | Status: AC
  Filled 2022-09-10: qty 1

## 2022-09-10 MED ORDER — OXYCODONE HCL 5 MG/5ML PO SOLN: 5.0000 mg | Freq: Once | ORAL | Status: AC | PRN

## 2022-09-10 MED ORDER — METOCLOPRAMIDE HCL 5 MG/ML IJ SOLN: 5.0000 mg | Freq: Three times a day (TID) | INTRAMUSCULAR | Status: DC | PRN

## 2022-09-10 MED ORDER — OXYCODONE HCL 5 MG PO TABS
5.0000 mg | ORAL_TABLET | Freq: Once | ORAL | Status: AC | PRN
  Administered 2022-09-10: 5 mg via ORAL

## 2022-09-10 MED ORDER — SUGAMMADEX SODIUM 500 MG/5ML IV SOLN
INTRAVENOUS | Status: AC
  Filled 2022-09-10: qty 5

## 2022-09-10 MED ORDER — PROPOFOL 10 MG/ML IV BOLUS
INTRAVENOUS | Status: DC | PRN
  Administered 2022-09-10: 40 mg via INTRAVENOUS
  Administered 2022-09-10: 170 mg via INTRAVENOUS
  Administered 2022-09-10: 20 mg via INTRAVENOUS
  Administered 2022-09-10: 30 mg via INTRAVENOUS

## 2022-09-10 MED ORDER — DEXAMETHASONE SODIUM PHOSPHATE 10 MG/ML IJ SOLN
INTRAMUSCULAR | Status: DC | PRN
  Administered 2022-09-10: 10 mg via INTRAVENOUS

## 2022-09-10 MED ORDER — BUPIVACAINE LIPOSOME 1.3 % IJ SUSP
INTRAMUSCULAR | Status: AC
  Filled 2022-09-10: qty 20

## 2022-09-10 MED ORDER — SUCCINYLCHOLINE CHLORIDE 200 MG/10ML IV SOSY
PREFILLED_SYRINGE | INTRAVENOUS | Status: AC
  Filled 2022-09-10: qty 10

## 2022-09-10 MED ORDER — ONDANSETRON HCL 4 MG/2ML IJ SOLN: 4.0000 mg | Freq: Once | INTRAMUSCULAR | Status: DC | PRN

## 2022-09-10 MED ORDER — GLYCOPYRROLATE 0.2 MG/ML IJ SOLN
INTRAMUSCULAR | Status: AC
  Filled 2022-09-10: qty 1

## 2022-09-10 MED ORDER — ACETAMINOPHEN 10 MG/ML IV SOLN
INTRAVENOUS | Status: AC
  Filled 2022-09-10: qty 100

## 2022-09-10 MED ORDER — MIDAZOLAM HCL 2 MG/2ML IJ SOLN
INTRAMUSCULAR | Status: AC
  Filled 2022-09-10: qty 2

## 2022-09-10 MED ORDER — ROCURONIUM BROMIDE 10 MG/ML (PF) SYRINGE
PREFILLED_SYRINGE | INTRAVENOUS | Status: AC
  Filled 2022-09-10: qty 10

## 2022-09-10 MED ORDER — ONDANSETRON HCL 4 MG PO TABS: 4.0000 mg | ORAL_TABLET | Freq: Four times a day (QID) | ORAL | Status: DC | PRN

## 2022-09-10 MED ORDER — ONDANSETRON HCL 4 MG/2ML IJ SOLN
INTRAMUSCULAR | Status: DC | PRN
  Administered 2022-09-10: 4 mg via INTRAVENOUS

## 2022-09-10 MED ORDER — MIDAZOLAM HCL 2 MG/2ML IJ SOLN
INTRAMUSCULAR | Status: DC | PRN
  Administered 2022-09-10: 2 mg via INTRAVENOUS

## 2022-09-10 MED ORDER — KETOROLAC TROMETHAMINE 30 MG/ML IJ SOLN
INTRAMUSCULAR | Status: AC
  Filled 2022-09-10: qty 1

## 2022-09-10 MED ORDER — SCOPOLAMINE 1 MG/3DAYS TD PT72
MEDICATED_PATCH | TRANSDERMAL | Status: AC
  Administered 2022-09-10: 1.5 mg via TRANSDERMAL
  Filled 2022-09-10: qty 1

## 2022-09-10 MED ORDER — ALBUTEROL SULFATE HFA 108 (90 BASE) MCG/ACT IN AERS
INHALATION_SPRAY | RESPIRATORY_TRACT | Status: DC | PRN
  Administered 2022-09-10: 4 via RESPIRATORY_TRACT

## 2022-09-10 MED ORDER — LIDOCAINE HCL (CARDIAC) PF 100 MG/5ML IV SOSY
PREFILLED_SYRINGE | INTRAVENOUS | Status: DC | PRN
  Administered 2022-09-10: 100 mg via INTRAVENOUS

## 2022-09-10 MED ORDER — METOCLOPRAMIDE HCL 10 MG PO TABS: 5.0000 mg | ORAL_TABLET | Freq: Three times a day (TID) | ORAL | Status: DC | PRN

## 2022-09-10 MED ORDER — KETOROLAC TROMETHAMINE 30 MG/ML IJ SOLN
INTRAMUSCULAR | Status: DC | PRN
  Administered 2022-09-10: 30 mg via INTRAVENOUS

## 2022-09-10 MED ORDER — LIDOCAINE HCL (PF) 2 % IJ SOLN
INTRAMUSCULAR | Status: AC
  Filled 2022-09-10: qty 5

## 2022-09-10 MED ORDER — 0.9 % SODIUM CHLORIDE (POUR BTL) OPTIME
TOPICAL | Status: DC | PRN
  Administered 2022-09-10: 500 mL

## 2022-09-10 MED ORDER — PROPOFOL 10 MG/ML IV BOLUS
INTRAVENOUS | Status: AC
  Filled 2022-09-10: qty 40

## 2022-09-10 SURGICAL SUPPLY — 80 items
BIT DRILL 2 FENESTRATED (MISCELLANEOUS) IMPLANT
BIT DRILL SOLID 2.0 X 110MM (DRILL) IMPLANT
BIT DRILLL 2 FENESTRATED (MISCELLANEOUS) ×1
BLADE SURG 15 STRL LF DISP TIS (BLADE) ×2 IMPLANT
BLADE SURG 15 STRL SS (BLADE) ×2
BNDG CMPR 5X4 CHSV STRCH STRL (GAUZE/BANDAGES/DRESSINGS) ×1
BNDG CMPR 75X21 PLY HI ABS (MISCELLANEOUS) ×1
BNDG CMPR STD VLCR NS LF 5.8X4 (GAUZE/BANDAGES/DRESSINGS) ×2
BNDG COHESIVE 4X5 TAN STRL LF (GAUZE/BANDAGES/DRESSINGS) ×1 IMPLANT
BNDG ELASTIC 4X5.8 VLCR NS LF (GAUZE/BANDAGES/DRESSINGS) ×2 IMPLANT
BNDG ESMARK 4X12 TAN STRL LF (GAUZE/BANDAGES/DRESSINGS) ×1 IMPLANT
BNDG GAUZE DERMACEA FLUFF 4 (GAUZE/BANDAGES/DRESSINGS) ×1 IMPLANT
BNDG GZE 12X3 1 PLY HI ABS (GAUZE/BANDAGES/DRESSINGS) ×1
BNDG GZE DERMACEA 4 6PLY (GAUZE/BANDAGES/DRESSINGS) ×1
BNDG STRETCH GAUZE 3IN X12FT (GAUZE/BANDAGES/DRESSINGS) ×1 IMPLANT
BOOT STEPPER DURA SM (SOFTGOODS) IMPLANT
BUR 4X45 EGG (BURR) ×1 IMPLANT
COVER PIN YLW 0.028-062 (MISCELLANEOUS) ×2 IMPLANT
CUFF TOURN SGL QUICK 18X4 (TOURNIQUET CUFF) IMPLANT
CUFF TOURN SGL QUICK 24 (TOURNIQUET CUFF)
CUFF TRNQT CYL 24X4X16.5-23 (TOURNIQUET CUFF) IMPLANT
DRAPE C-ARM XRAY 36X54 (DRAPES) ×1 IMPLANT
DRAPE C-ARMOR (DRAPES) ×1 IMPLANT
DRAPE FLUOR MINI C-ARM 54X84 (DRAPES) IMPLANT
DRILL SOLID 2.0 X 110MM (DRILL) ×1
DURAPREP 26ML APPLICATOR (WOUND CARE) ×1 IMPLANT
ELECT REM PT RETURN 9FT ADLT (ELECTROSURGICAL) ×1
ELECTRODE REM PT RTRN 9FT ADLT (ELECTROSURGICAL) ×1 IMPLANT
GAUZE SPONGE 4X4 12PLY STRL (GAUZE/BANDAGES/DRESSINGS) ×1 IMPLANT
GAUZE STRETCH 2X75IN STRL (MISCELLANEOUS) ×1 IMPLANT
GAUZE XEROFORM 1X8 LF (GAUZE/BANDAGES/DRESSINGS) ×1 IMPLANT
GLOVE BIO SURGEON STRL SZ7.5 (GLOVE) ×1 IMPLANT
GLOVE SURG UNDER LTX SZ8 (GLOVE) ×1 IMPLANT
GOWN STRL REUS W/ TWL XL LVL3 (GOWN DISPOSABLE) ×2 IMPLANT
GOWN STRL REUS W/TWL XL LVL3 (GOWN DISPOSABLE) ×2
K-WIRE SMOOTH TROCAR 2.0X150 (WIRE) ×2
KIT TURNOVER KIT A (KITS) ×1 IMPLANT
KWIRE SMOOTH TROCAR 2.0X150 (WIRE) IMPLANT
MANIFOLD NEPTUNE II (INSTRUMENTS) ×1 IMPLANT
NDL FILTER BLUNT 18X1 1/2 (NEEDLE) ×1 IMPLANT
NDL HYPO 25X1 1.5 SAFETY (NEEDLE) ×2 IMPLANT
NEEDLE FILTER BLUNT 18X1 1/2 (NEEDLE) ×1 IMPLANT
NEEDLE HYPO 25X1 1.5 SAFETY (NEEDLE) ×2 IMPLANT
NS IRRIG 1000ML POUR BTL (IV SOLUTION) ×1 IMPLANT
PACK EXTREMITY ARMC (MISCELLANEOUS) ×1 IMPLANT
PADDING CAST BLEND 4X4 NS (MISCELLANEOUS) ×1 IMPLANT
PLATE 4HOLE IMPLANT
PLATE TMT SLANTED-T 3H LEFT (Plate) IMPLANT
PUTTY DBX 5CC (Putty) ×1 IMPLANT
RASP SM TEAR CROSS CUT (RASP) IMPLANT
SCREW 2.7 X 13 NON LOCK (Screw) IMPLANT
SCREW LOCK PLATE R3 2.7X10 (Screw) IMPLANT
SCREW LOCK PLATE R3 2.7X14 (Screw) IMPLANT
SCREW LOCK PLATE R3 2.7X15 (Screw) IMPLANT
SCREW LOCK PLATE R3 2.7X18 (Screw) IMPLANT
SCREW LOCK PLATE R3 2.7X20 (Screw) IMPLANT
SCREW LOCK PLATE R3 2.7X22 (Screw) IMPLANT
SPLINT CAST 1 STEP 4X30 (MISCELLANEOUS) ×1 IMPLANT
SPLINT PLASTER CAST FAST 5X30 (CAST SUPPLIES) ×1 IMPLANT
STOCKINETTE M/LG 89821 (MISCELLANEOUS) ×1 IMPLANT
STRIP CLOSURE SKIN 1/4X4 (GAUZE/BANDAGES/DRESSINGS) ×1 IMPLANT
SUT ETHILON 3-0 FS-10 30 BLK (SUTURE) ×1
SUT ETHILON 4-0 (SUTURE)
SUT ETHILON 4-0 FS2 18XMFL BLK (SUTURE)
SUT MNCRL 4-0 (SUTURE) ×1
SUT MNCRL 4-0 27XMFL (SUTURE) ×1
SUT VIC AB 3-0 SH 27 (SUTURE) ×1
SUT VIC AB 3-0 SH 27X BRD (SUTURE) ×1 IMPLANT
SUT VIC AB 4-0 FS2 27 (SUTURE) ×1 IMPLANT
SUT VIC AB 4-0 SH 27 (SUTURE) ×1
SUT VIC AB 4-0 SH 27XANBCTRL (SUTURE) IMPLANT
SUTURE EHLN 3-0 FS-10 30 BLK (SUTURE) ×1 IMPLANT
SUTURE ETHLN 4-0 FS2 18XMF BLK (SUTURE) ×1 IMPLANT
SUTURE MNCRL 4-0 27XMF (SUTURE) IMPLANT
SYR 10ML LL (SYRINGE) ×1 IMPLANT
TOWEL OR 17X26 4PK STRL BLUE (TOWEL DISPOSABLE) ×1 IMPLANT
TRAP FLUID SMOKE EVACUATOR (MISCELLANEOUS) ×1 IMPLANT
WATER STERILE IRR 500ML POUR (IV SOLUTION) ×1 IMPLANT
WIRE OLIVE SMOOTH 1.4MMX60MM (WIRE) IMPLANT
WIRE Z .062 C-WIRE SPADE TIP (WIRE) ×3 IMPLANT

## 2022-09-10 NOTE — Anesthesia Procedure Notes (Addendum)
Procedure Name: Intubation Date/Time: 09/10/2022 7:36 AM  Performed by: Demetrius Charity, CRNAPre-anesthesia Checklist: Patient identified, Patient being monitored, Timeout performed, Emergency Drugs available and Suction available Patient Re-evaluated:Patient Re-evaluated prior to induction Oxygen Delivery Method: Circle system utilized Preoxygenation: Pre-oxygenation with 100% oxygen Induction Type: IV induction Ventilation: Mask ventilation without difficulty Laryngoscope Size: McGraph and 4 Grade View: Grade I Tube type: Oral Tube size: 6.5 mm Number of attempts: 1 Airway Equipment and Method: Stylet and Video-laryngoscopy Placement Confirmation: ETT inserted through vocal cords under direct vision, positive ETCO2 and breath sounds checked- equal and bilateral Secured at: 21 cm Tube secured with: Tape Dental Injury: Teeth and Oropharynx as per pre-operative assessment

## 2022-09-10 NOTE — H&P (Signed)
HISTORY AND PHYSICAL INTERVAL NOTE:  09/10/2022  7:12 AM  Yvonne [name]  has presented today for surgery, with the diagnosis of left foot pain Primary osteoarthritis of left foot Rheumatoid arthritis, seropositive.  The various methods of treatment have been discussed with the patient.  No guarantees were given.  After consideration of risks, benefits and other options for treatment, the patient has consented to surgery.  I have reviewed the patients' chart and labs.     A history and physical examination was performed in my office.  The patient was reexamined.  There have been no changes to this history and physical examination.  Yvonne [name] A

## 2022-09-10 NOTE — Telephone Encounter (Signed)
Sangeetha called from [facility] on [address] to state that they received a prescription for traMADol (ULTRAM) 50 MG tablet from our office. Robie Ridge states she received a prescription for percocet from another office.  Robie Ridge states it looks like patient has a procedure going on because it is from an operating clinic.  Sangeetha needs to have the diagnosis so she will know why the patient is receiving traMADol (ULTRAM) 50 MG tablet.  Robie Ridge would like to know if Mable Paris, FNP, would like for patient to have the traMADol (ULTRAM) 50 MG tablet filled since she getting the percocet.

## 2022-09-10 NOTE — Telephone Encounter (Signed)
Haskell @ [phone]) and spoke to [facility] and she stated that she was discontinuing the Tramadol for now until pt finishes with the Percocet prescribed by Podiatry. Pt was only given enough Percocet for 5 days.

## 2022-09-10 NOTE — Anesthesia Preprocedure Evaluation (Signed)
Anesthesia Evaluation  Patient identified by MRN, date of birth, ID band Patient awake    Reviewed: Allergy & Precautions, NPO status , Patient's Chart, lab work & pertinent test results  History of Anesthesia Complications (+) PONV and history of anesthetic complications  Airway Mallampati: II  TM Distance: >3 FB Neck ROM: Full    Dental no notable dental hx. (+) Teeth Intact   Pulmonary neg sleep apnea, neg COPD, Patient abstained from smoking.Not current smoker, former smoker,  Takes inhaler for "shortness of breath sometimes" but says she does not have any respiratory diagnoses   Pulmonary exam normal breath sounds clear to auscultation       Cardiovascular Exercise Tolerance: Good METShypertension, (-) CAD and (-) Past MI (-) dysrhythmias  Rhythm:Regular Rate:Normal - Systolic murmurs    Neuro/Psych  Headaches, negative psych ROS   GI/Hepatic GERD  Medicated,(+)     (-) substance abuse  ,   Endo/Other  diabetesMorbid obesity  Renal/GU negative Renal ROS     Musculoskeletal   Abdominal (+) + obese,   Peds  Hematology   Anesthesia Other Findings Past Medical History: No date: Allergy No date: Arthritis, rheumatoid (HCC) No date: Degenerative disc disease, cervical No date: GERD (gastroesophageal reflux disease) No date: Gestational diabetes     Comment:  patient denies No date: Glaucoma No date: H/O bronchitis No date: Headache     Comment:  migraine No date: History of ovarian cyst No date: Hypertension     Comment:  pre-eclampsia with first child No date: Increased pressure in the eye, bilateral 10/2021: Pneumonia No date: PONV (postoperative nausea and vomiting) No date: Restless leg 2021: Shingles No date: Thyroid nodule  Reproductive/Obstetrics                             Anesthesia Physical Anesthesia Plan  ASA: 3  Anesthesia Plan: General   Post-op Pain  Management: Ofirmev IV (intra-op)* and Toradol IV (intra-op)*   Induction: Intravenous  PONV Risk Score and Plan: 4 or greater and Ondansetron, Dexamethasone and Midazolam  Airway Management Planned: Oral ETT  Additional Equipment: None  Intra-op Plan:   Post-operative Plan: Extubation in OR  Informed Consent: I have reviewed the patients History and Physical, chart, labs and discussed the procedure including the risks, benefits and alternatives for the proposed anesthesia with the patient or authorized representative who has indicated his/her understanding and acceptance.     Dental advisory given  Plan Discussed with: CRNA and Surgeon  Anesthesia Plan Comments: (Discussed risks of anesthesia with patient, including PONV, sore throat, lip/dental/eye damage. Rare risks discussed as well, such as cardiorespiratory and neurological sequelae, and allergic reactions. Discussed the role of CRNA in patient's perioperative care. Patient understands. Patient informed about increased incidence of above perioperative risk due to high BMI. Patient understands. )       Anesthesia Quick Evaluation

## 2022-09-10 NOTE — Anesthesia Postprocedure Evaluation (Signed)
Anesthesia Post Note  Patient: Yvonne [name]  Procedure(s) Performed: ARTHRODESIS FOOT (Left: Foot)  Patient location during evaluation: PACU Anesthesia Type: General Level of consciousness: awake and alert Pain management: pain level controlled Vital Signs Assessment: post-procedure vital signs reviewed and stable Respiratory status: spontaneous breathing, nonlabored ventilation, respiratory function stable and patient connected to nasal cannula oxygen Cardiovascular status: blood pressure returned to baseline and stable Postop Assessment: no apparent nausea or vomiting Anesthetic complications: no   No notable events documented.   Last Vitals:  Vitals:   09/10/22 1013 09/10/22 1024  BP: 91/81 112/70  Pulse: (!) 57 77  Resp: 16 18  Temp: (!) 36.2 C (!) 36.2 C  SpO2: 100%     Last Pain:  Vitals:   09/10/22 1024  TempSrc: Tympanic  PainSc: 0-No pain                 Arita Miss

## 2022-09-10 NOTE — Discharge Instructions (Addendum)
[facility]  POST OPERATIVE INSTRUCTIONS FOR DR. FOWLER AND [facility] PODIATRY DEPARTMENT   Take your medication as prescribed.  Pain medication should be taken only as needed.  Keep the dressing clean, dry and intact.  Keep your foot elevated above the heart level for the first 48 hours.  We have instructed you to be non-weight bearing.  Always wear your post-op shoe when walking.  Always use your crutches if you are to be non-weight bearing.  Do not take a shower. Baths are permissible as long as the foot is kept out of the water.   Every hour you are awake:  Bend your knee 15 times.  Call [facility] ([phone]) if any of the following problems occur: You develop a temperature or fever. The bandage becomes saturated with blood. Medication does not stop your pain. Injury of the foot occurs. Any symptoms of infection including redness, odor, or red streaks running from wound.  AMBULATORY SURGERY  DISCHARGE INSTRUCTIONS   The drugs that you were given will stay in your system until tomorrow so for the next 24 hours you should not:  Drive an automobile Make any legal decisions Drink any alcoholic beverage   You may resume regular meals tomorrow.  Today it is better to start with liquids and gradually work up to solid foods.  You may eat anything you prefer, but it is better to start with liquids, then soup and crackers, and gradually work up to solid foods.   Please notify your doctor immediately if you have any unusual bleeding, trouble breathing, redness and pain at the surgery site, drainage, fever, or pain not relieved by medication.    Additional Instructions:  Please contact your physician with any problems or Same Day Surgery at [phone], Monday through Friday 6 am to 4 pm, or [facility] [place] Main number at [phone]. 

## 2022-09-10 NOTE — Op Note (Signed)
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  Osteoarthritis dorsal left second and third met cuneiform joint. 2.  Para-articular spurring first met cuneiform joint left foot    Procedure: 1.  Arthrodesis second and third metatarsal cuneiform joint 2.  Exostectomy first met cuneiform joint left foot  3. intraoperative fluoroscopy left foot    EBL: Minimal    Anesthesia:local and general.  Local consisted of a total of 20 cc of Exparel long-acting anesthetic and 10 cc of 0.5% bupivacaine    Hemostasis: Mid calf tourniquet inflated to 200 mmHg for 63 minutes    Specimen: None    Complications: None    Operative indications:Yvonne [name] is an 56 y.o. that presents today for surgical intervention.  The risks/benefits/alternatives/complications have been discussed and consent has been given.    Procedure:  Patient was brought into the OR and placed on the operating table in thesupine position. After anesthesia was obtained theleft lower extremity was prepped and draped in usual sterile fashion.  A dorsal lateral left foot incision was performed overlying the level of the third metatarsal cuneiform joint area.  Sharp and blunt dissection carried down to the extensor muscle belly.  This was then incised and retracted both medial and lateral.  The extensor tendons were retracted lateral.  At this time subperiosteal dissection was performed.  A large amount of para-articular spurring was noted around the second and third met cuneiform joint.  A small amount of exostosis overlying the dorsal first met cuneiform joint was noted at this time.  Dissection was carried out diffusely around all of these areas.  With the use of a rongeur as well as a power rasp the first met cuneiform joint, second cuneiform joint, and third met cuneiform joint were all removed periarticular spurring  down to smooth bone.  At this time the second and third metatarsal cuneiform joint were exposed.  Minimal articular cartilage was noted within either of these joints.  The small amount of residual articular cartilage was then removed from both joints with a curette down to the subchondral bone plate.  The wounds was flushed with copious amounts of irrigation.  At this time the subchondral bone plates were then drilled with a 2.0 mm drill bit.  Next dorsal locking plates were applied along the joint arthrodesis site.  A 4 hole locking met cuneiform fusion plate from the [employer] set was used with 2.7 millimeter screws with compression within the plate.  A 5 hole compression met cuneiform joint fusion plate along the second met cuneiform fusion site was used with 2.7 millimeter screws with compression through the plate.  Good alignment and stability was noted in all planes with the use of intraoperative fluoroscopy.  Intraoperative fluoroscopy was used both pre and post placement of the fusion plates.  At this time all wounds were then flushed with copious amounts of irrigation.  Layered closure was performed with a combination of 3-0 Vicryl, 4-0 Vicryl and a 4-0 Monocryl for the skin.  A bulky sterile dressing was applied.  Patient was placed in an equalizer walker boot at the end of the case.   Patient tolerated the procedure and anesthesia well.  Was transported from the OR to the PACU with all vital signs stable and vascular status intact. To be discharged per routine protocol.  Will follow up in approximately 1 week  in the outpatient clinic.

## 2022-09-10 NOTE — Telephone Encounter (Signed)
Spoke to husband and explained to him that we discontinued the Tramadol for now until she finishes the Percocet because she can not take both at the same time. Will send Tramadol again after totally finishing the Percocet. Husband verbalized understanding!

## 2022-09-10 NOTE — Telephone Encounter (Signed)
Call pharmacy.  She had foot surgery and it looks like podiatrist has called in [place].  Please cancel tramadol at pharmacy.  Please call patient to let her know that we cancel tramadol for now as she is taking Percocet after foot surgery and she cannot take both medications at the same time

## 2022-09-10 NOTE — Progress Notes (Signed)
I looked up patient on [place] Controlled Substances Reporting System PMP AWARE and saw no activity that raised concern of inappropriate use.   

## 2022-09-10 NOTE — Telephone Encounter (Signed)
LVM TO CALL BACK TO OFFICE  

## 2022-09-10 NOTE — Telephone Encounter (Signed)
Patient's husband, Gelisa Tieken, returned [place], [place] call.  I transferred call to [facility].

## 2022-09-10 NOTE — Transfer of Care (Signed)
Immediate Anesthesia Transfer of Care Note  Patient: Yvonne [name]  Procedure(s) Performed: ARTHRODESIS FOOT (Left: Foot)  Patient Location: PACU  Anesthesia Type:General  Level of Consciousness: awake, alert  and oriented  Airway & Oxygen Therapy: Patient Spontanous Breathing and Patient connected to face mask oxygen  Post-op Assessment: Report given to RN and Post -op Vital signs reviewed and stable  Post vital signs: Reviewed and stable  Last Vitals:  Vitals Value Taken Time  BP 92/39 09/10/22 0942  Temp    Pulse 58 09/10/22 0946  Resp 20 09/10/22 0946  SpO2 98 % 09/10/22 0946  Vitals shown include unvalidated device data.  Last Pain:  Vitals:   09/10/22 0619  TempSrc: Oral  PainSc: 4          Complications: No notable events documented.

## 2022-09-14 ENCOUNTER — Other Ambulatory Visit: Payer: Self-pay

## 2022-09-14 DIAGNOSIS — G43809 Other migraine, not intractable, without status migrainosus: Secondary | ICD-10-CM

## 2022-09-14 MED ORDER — SUMATRIPTAN SUCCINATE 50 MG PO TABS: ORAL_TABLET | ORAL | 2 refills | Status: DC

## 2022-09-15 ENCOUNTER — Other Ambulatory Visit: Payer: Self-pay | Admitting: Family

## 2022-09-15 ENCOUNTER — Encounter: Payer: Self-pay | Admitting: Family

## 2022-09-15 DIAGNOSIS — G894 Chronic pain syndrome: Secondary | ICD-10-CM

## 2022-09-16 ENCOUNTER — Other Ambulatory Visit: Payer: Self-pay

## 2022-09-16 DIAGNOSIS — R42 Dizziness and giddiness: Secondary | ICD-10-CM

## 2022-09-16 MED ORDER — FEXOFENADINE HCL 180 MG PO TABS: 180.0000 mg | ORAL_TABLET | Freq: Every day | ORAL | 1 refills | Status: DC

## 2022-09-23 ENCOUNTER — Encounter: Payer: Self-pay | Admitting: Podiatry

## 2022-09-25 ENCOUNTER — Telehealth: Payer: Managed Care, Other (non HMO) | Admitting: Family

## 2022-09-25 DIAGNOSIS — R399 Unspecified symptoms and signs involving the genitourinary system: Secondary | ICD-10-CM | POA: Diagnosis not present

## 2022-09-25 MED ORDER — CEPHALEXIN 500 MG PO CAPS: 500.0000 mg | ORAL_CAPSULE | Freq: Two times a day (BID) | ORAL | 0 refills | Status: DC

## 2022-09-25 NOTE — Patient Instructions (Signed)
Urinary Tract Infection, Adult  A urinary tract infection (UTI) is an infection of any part of the urinary tract. The urinary tract includes the kidneys, ureters, bladder, and urethra. These organs make, store, and get rid of urine in the body. An upper UTI affects the ureters and kidneys. A lower UTI affects the bladder and urethra. What are the causes? Most urinary tract infections are caused by bacteria in your genital area around your urethra, where urine leaves your body. These bacteria grow and cause inflammation of your urinary tract. What increases the risk? You are more likely to develop this condition if: You have a urinary catheter that stays in place. You are not able to control when you urinate or have a bowel movement (incontinence). You are female and you: Use a spermicide or diaphragm for birth control. Have low estrogen levels. Are pregnant. You have certain genes that increase your risk. You are sexually active. You take antibiotic medicines. You have a condition that causes your flow of urine to slow down, such as: An enlarged prostate, if you are female. Blockage in your urethra. A kidney stone. A nerve condition that affects your bladder control (neurogenic bladder). Not getting enough to drink, or not urinating often. You have certain medical conditions, such as: Diabetes. A weak disease-fighting system (immunesystem). Sickle cell disease. Gout. Spinal cord injury. What are the signs or symptoms? Symptoms of this condition include: Needing to urinate right away (urgency). Frequent urination. This may include small amounts of urine each time you urinate. Pain or burning with urination. Blood in the urine. Urine that smells bad or unusual. Trouble urinating. Cloudy urine. Vaginal discharge, if you are female. Pain in the abdomen or the lower back. You may also have: Vomiting or a decreased appetite. Confusion. Irritability or tiredness. A fever or  chills. Diarrhea. The first symptom in older adults may be confusion. In some cases, they may not have any symptoms until the infection has worsened. How is this diagnosed? This condition is diagnosed based on your medical history and a physical exam. You may also have other tests, including: Urine tests. Blood tests. Tests for STIs (sexually transmitted infections). If you have had more than one UTI, a cystoscopy or imaging studies may be done to determine the cause of the infections. How is this treated? Treatment for this condition includes: Antibiotic medicine. Over-the-counter medicines to treat discomfort. Drinking enough water to stay hydrated. If you have frequent infections or have other conditions such as a kidney stone, you may need to see a health care provider who specializes in the urinary tract (urologist). In rare cases, urinary tract infections can cause sepsis. Sepsis is a life-threatening condition that occurs when the body responds to an infection. Sepsis is treated in the hospital with IV antibiotics, fluids, and other medicines. Follow these instructions at home:  Medicines Take over-the-counter and prescription medicines only as told by your health care provider. If you were prescribed an antibiotic medicine, take it as told by your health care provider. Do not stop using the antibiotic even if you start to feel better. General instructions Make sure you: Empty your bladder often and completely. Do not hold urine for long periods of time. Empty your bladder after sex. Wipe from front to back after urinating or having a bowel movement if you are female. Use each tissue only one time when you wipe. Drink enough fluid to keep your urine pale yellow. Keep all follow-up visits. This is important. Contact a health   care provider if: Your symptoms do not get better after 1-2 days. Your symptoms go away and then return. Get help right away if: You have severe pain in  your back or your lower abdomen. You have a fever or chills. You have nausea or vomiting. Summary A urinary tract infection (UTI) is an infection of any part of the urinary tract, which includes the kidneys, ureters, bladder, and urethra. Most urinary tract infections are caused by bacteria in your genital area. Treatment for this condition often includes antibiotic medicines. If you were prescribed an antibiotic medicine, take it as told by your health care provider. Do not stop using the antibiotic even if you start to feel better. Keep all follow-up visits. This is important. This information is not intended to replace advice given to you by your health care provider. Make sure you discuss any questions you have with your health care provider. Document Revised: 06/27/2020 Document Reviewed: 06/27/2020 Elsevier Patient Education  2023 Elsevier Inc.  

## 2022-09-25 NOTE — Progress Notes (Signed)
Virtual Visit Consent   Yvonne [name], you are scheduled for a virtual visit with a [facility] provider today. Just as with appointments in the office, your consent must be obtained to participate. Your consent will be active for this visit and any virtual visit you may have with one of our providers in the next 365 days. If you have a MyChart account, a copy of this consent can be sent to you electronically.  As this is a virtual visit, video technology does not allow for your provider to perform a traditional examination. This may limit your provider's ability to fully assess your condition. If your provider identifies any concerns that need to be evaluated in person or the need to arrange testing (such as labs, EKG, etc.), we will make arrangements to do so. Although advances in technology are sophisticated, we cannot ensure that it will always work on either your end or our end. If the connection with a video visit is poor, the visit may have to be switched to a telephone visit. With either a video or telephone visit, we are not always able to ensure that we have a secure connection.  By engaging in this virtual visit, you consent to the provision of healthcare and authorize for your insurance to be billed (if applicable) for the services provided during this visit. Depending on your insurance coverage, you may receive a charge related to this service.  I need to obtain your verbal consent now. Are you willing to proceed with your visit today? [name] has provided verbal consent on 09/25/2022 for a virtual visit (video or telephone). [name], FNP  Date: 09/25/2022 8:59 AM  Virtual Visit via Video Note   I, [name], connected with  Yvonne [name]  ([id number], Jun 17, 1966) on 09/25/22 at  9:45 AM EDT by a video-enabled telemedicine application and verified that I am speaking with the correct person using two identifiers.  Location: Patient: Virtual Visit Location Patient:  Home Provider: Virtual Visit Location Provider: Home Office   I discussed the limitations of evaluation and management by telemedicine and the availability of in person appointments. The patient expressed understanding and agreed to proceed.    History of Present Illness: Yvonne [name] is a 56 y.o. who identifies as a female who was assigned female at birth, and is being seen today for UI symptoms that started yesterday and has worsen.  HPI: Dysuria  This is a new problem. The current episode started yesterday. The problem occurs every urination. The problem has been gradually worsening. The quality of the pain is described as burning. The pain is at a severity of 5/10. The pain is moderate. Associated symptoms include frequency, hesitancy, nausea and urgency. Pertinent negatives include no chills, discharge, flank pain, hematuria or vomiting. She has tried increased fluids for the symptoms. The treatment provided mild relief.    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   COVID-19 12/19/2020   Cervical radicular pain 07/10/2020   Cervical facet joint syndrome 07/10/2020   Lumbar spondylosis 07/10/2020   Chronic pain syndrome 07/10/2020   Atherosclerosis of aorta ([place]) 03/25/2020   Arthralgia 03/19/2020   Restless leg 01/01/2020   Hyperglycemia 10/28/2019   Morbid obesity with body mass index (BMI) of 40.0 or higher ([place]) 04/29/2019   Migraines 03/29/2019   Primary  osteoarthritis of both knees 01/26/2019   Screen for colon cancer 01/19/2019   Encounter for long-term (current) use of high-risk medication 01/19/2019   Non-seasonal allergic rhinitis 01/19/2019   Rheumatoid factor positive 01/17/2019   Prediabetes 12/15/2018   Chronic pain of both knees 11/17/2018   Right wrist pain 11/17/2018   GERD (gastroesophageal reflux disease) 03/31/2018   Skin lesion 03/01/2018   Hayfever 02/08/2018   Chronic nonintractable headache 02/08/2018   Hypertension 02/08/2018   Menopausal hot flushes 02/08/2018    Allergies:  Allergies  Allergen Reactions   Dried Figs [Ficus] Anaphylaxis    Tongue tingling & felt like throat was swelling.     Flonase [Fluticasone Propionate]     Increases eye pressure    Sulfa Antibiotics Swelling   Tussionex Pennkinetic Er [Hydrocod Poli-Chlorphe Poli Er] Itching    Hyperactive    Medications:  Current Outpatient Medications:    cephALEXin (KEFLEX) 500 MG capsule, Take 1 capsule (500 mg total) by mouth 2 (two) times daily., Disp: 14 capsule, Rfl: 0   acetaminophen (TYLENOL) 650 MG CR tablet, Take 650 mg by mouth every 8 (eight) hours as needed for pain., Disp: , Rfl:    albuterol (VENTOLIN HFA) 108 (90 Base) MCG/ACT inhaler, Inhale 1-2 puffs into the lungs as needed. (Patient taking differently: Inhale 1-2 puffs into the lungs as needed (was given when she had pneumonia in dec 2022).), Disp: 18 g, Rfl: 0   amLODipine (NORVASC) 5 MG tablet, Take 1 tablet (5 mg total) by mouth daily. (Patient taking differently: Take 5 mg by mouth every morning.), Disp: 90 tablet, Rfl: 3   baclofen (LIORESAL) 10 MG tablet, Take 1 tablet (10 mg total) by mouth 2 (two) times daily., Disp: 90 tablet, Rfl: 0   clindamycin (CLINDAGEL) 1 % gel, Apply topically 2 (two) times daily., Disp: 30 g, Rfl: 4   cycloSPORINE (RESTASIS) 0.05 % ophthalmic emulsion, Place 1 drop into both eyes 2 (two) times daily., Disp: , Rfl:    DULoxetine (CYMBALTA) 60 MG  capsule, TAKE 1 CAPSULE TWICE A DAY, Disp: 180 capsule, Rfl: 3   fexofenadine (ALLEGRA ALLERGY) 180 MG tablet, Take 1 tablet (180 mg total) by mouth daily., Disp: 90 tablet, Rfl: 1   gabapentin (NEURONTIN) 300 MG capsule, Take 1 capsule (300 mg total) by mouth in the morning, at noon, and at bedtime., Disp: 260 capsule, Rfl: 3   hydroxychloroquine (PLAQUENIL) 200 MG tablet, Take 200 mg by mouth 2 (two) times daily., Disp: , Rfl:    latanoprost (XALATAN) 0.005 % ophthalmic solution, Place 1 drop into both eyes at bedtime., Disp: , Rfl:    lubiprostone (AMITIZA) 8 MCG capsule, TAKE 1 CAPSULE BY MOUTH TWICE DAILY WITH A MEAL (Patient taking differently: 8 mcg 2 (two) times daily with a meal. TAKE 1 CAPSULE BY MOUTH TWICE DAILY WITH A MEAL), Disp: 180 capsule, Rfl: 2   Melatonin 10 MG CAPS, Take 1 capsule by mouth at bedtime., Disp: , Rfl:    meloxicam (MOBIC) 7.5 MG tablet, TAKE 1 TABLET DAILY WITH FOOD AS NEEDED FOR PAIN (  Patient taking differently: Take 7.5 mg by mouth daily as needed.), Disp: 90 tablet, Rfl: 3   montelukast (SINGULAIR) 10 MG tablet, TAKE 1 TABLET AT BEDTIME (Patient taking differently: Take 10 mg by mouth at bedtime.), Disp: 90 tablet, Rfl: 3   nortriptyline (PAMELOR) 10 MG capsule, TAKE 1 CAPSULE AT BEDTIME (Patient taking differently: Take 10 mg by mouth at bedtime.), Disp: 90 capsule, Rfl: 3   omeprazole (PRILOSEC) 20 MG capsule, Take 20 mg by mouth every morning., Disp: , Rfl:    ondansetron (ZOFRAN-ODT) 8 MG disintegrating tablet, PLACE 1 TABLET ON THE TONGUE TWICE A DAY AS NEEDED FOR NAUSEA OR VOMITING AND MIGRAINE, Disp: 30 tablet, Rfl: 23   oxyCODONE-acetaminophen (PERCOCET) 5-325 MG tablet, Take 1-2 tablets by mouth every 6 (six) hours as needed for severe pain. Max 6 tabs per day, Disp: 30 tablet, Rfl: 0   propranolol (INDERAL) 40 MG tablet, TAKE 1 TABLET TWICE A DAY, Disp: 180 tablet, Rfl: 3   RINVOQ 15 MG TB24, Take 1 tablet by mouth every morning. Pt to hold this for  upcoming surgery beginning 09-03-22 per rheumatologist-Last dose on 09-02-22, Disp: , Rfl:    SUMAtriptan (IMITREX) 50 MG tablet, TAKE 1 TABLET BY MOUTH ONCE FOR 1 DOSE. MAY REPEAT IN 2 HOURS IF HEADACHE PERSISTS OR RECURS, Disp: 30 tablet, Rfl: 2   timolol (TIMOPTIC) 0.25 % ophthalmic solution, Place 1 drop into both eyes daily., Disp: , Rfl:    traMADol (ULTRAM) 50 MG tablet, Take 1 tablet (50 mg total) by mouth every 12 (twelve) hours as needed., Disp: 60 tablet, Rfl: 2  Observations/Objective: Patient is well-developed, well-nourished in no acute distress.  Resting comfortably  at home.  Head is normocephalic, atraumatic.  No labored breathing.  Speech is clear and coherent with logical content.  Patient is alert and oriented at baseline.    Assessment and Plan: 1. UTI symptoms - cephALEXin (KEFLEX) 500 MG capsule; Take 1 capsule (500 mg total) by mouth 2 (two) times daily.  Dispense: 14 capsule; Refill: 0  Force fluids AZO over the counter X2 days Follow up if symptoms worsen or do not improve   Follow Up Instructions: I discussed the assessment and treatment plan with the patient. The patient was provided an opportunity to ask questions and all were answered. The patient agreed with the plan and demonstrated an understanding of the instructions.  A copy of instructions were sent to the patient via MyChart unless otherwise noted below.     The patient was advised to call back or seek an in-person evaluation if the symptoms worsen or if the condition fails to improve as anticipated.  Time:  I spent 8 minutes with the patient via telehealth technology discussing the above problems/concerns.    [name], FNP

## 2022-09-27 ENCOUNTER — Other Ambulatory Visit: Payer: Self-pay | Admitting: Family

## 2022-09-27 DIAGNOSIS — G894 Chronic pain syndrome: Secondary | ICD-10-CM

## 2022-09-29 ENCOUNTER — Encounter: Payer: Self-pay | Admitting: Family

## 2022-10-01 NOTE — Telephone Encounter (Signed)
Patient has an appt with you to discuss on 11/22

## 2022-10-05 NOTE — Telephone Encounter (Signed)
Spoke to pt and scheduled her a VV for Friday 11/10 with you

## 2022-10-08 ENCOUNTER — Telehealth (INDEPENDENT_AMBULATORY_CARE_PROVIDER_SITE_OTHER): Payer: Managed Care, Other (non HMO) | Admitting: Family

## 2022-10-08 ENCOUNTER — Encounter: Payer: Self-pay | Admitting: Family

## 2022-10-08 VITALS — BP 122/84 | HR 71

## 2022-10-08 DIAGNOSIS — R300 Dysuria: Secondary | ICD-10-CM | POA: Diagnosis not present

## 2022-10-08 MED ORDER — NITROFURANTOIN MONOHYD MACRO 100 MG PO CAPS: 100.0000 mg | ORAL_CAPSULE | Freq: Two times a day (BID) | ORAL | 0 refills | Status: AC

## 2022-10-08 NOTE — Assessment & Plan Note (Signed)
Patient started on empiric Keflex 09/25/2022 with continued urinary symptoms.  Fortunately no systemic features.  I do suspect sitting after foot surgery may be exacerbating low back pain as well.  Explained the importance of obtaining urine specimen prior to antibiotic therapy for her safety.  Patient verbalized understanding.  Based on documented UTI in August 2021, I will start [place] today.  Patient is coming in today to leave urine studies with [place].  I reiterated the importance of probiotics.

## 2022-10-08 NOTE — Patient Instructions (Addendum)
Start macrobid ( new antibiotic)  Ensure to take probiotics while on antibiotics and also for 2 weeks after completion. This can either be by eating yogurt daily or taking a probiotic supplement over the counter such as Culturelle.It is important to re-colonize the gut with good bacteria and also to prevent any diarrheal infections associated with antibiotic use.   In the future, it is imperative that you have your urine checked prior to any antibiotic therapy for suspected UTI.  It can lead to poor patient outcomes, antibiotic resistance and unresolved UTI symptoms.

## 2022-10-08 NOTE — Progress Notes (Signed)
Virtual Visit via Video Note  I connected with@  on 10/08/22 at 10:30 AM EST by a video enabled telemedicine application and verified that I am speaking with the correct person using two identifiers.  Location patient: home Location provider:work  Persons participating in the virtual visit: patient, provider  I discussed the limitations of evaluation and management by telemedicine and the availability of in person appointments. The patient expressed understanding and agreed to proceed.   HPI: Patient declined in person visit   complains of urine is dark in color with odor, vaginal discharge is discolored, dysuria  x 12days  Endorses nausea.   She complains of right low back pain, which is new. Describes a dull pain.  Pain is worse with movement.  She feels related urine.  She has been sitting since foot surgery 09/23/22.   No numbness in legs, groin, fever, chills.  No history of renal stone  Patient reports a virtual appointment for suspected UTI, 09/25/22 She is on cephalexin '500mg'$  bid x 7 days. She is concerned that antibiotic is not working.  She has had foot surgery and didn't obtain urine studies.   Documented UTI 06/2020, e coli   ROS: See pertinent positives and negatives per HPI.    EXAM:  VITALS per patient if applicable: BP 834/19   Pulse 71   LMP 05/06/2017 (Exact Date) Comment: BSO  SpO2 96%  BP Readings from Last 3 Encounters:  10/08/22 122/84  09/10/22 112/70  08/27/22 134/82   Wt Readings from Last 3 Encounters:  09/10/22 255 lb 12.8 oz (116 kg)  08/27/22 255 lb 12.8 oz (116 kg)  08/16/22 255 lb (115.7 kg)    GENERAL: alert, oriented, appears well and in no acute distress  HEENT: atraumatic, conjunttiva clear, no obvious abnormalities on inspection of external nose and ears  NECK: normal movements of the head and neck  LUNGS: on inspection no signs of respiratory distress, breathing rate appears normal, no obvious gross SOB, gasping or  wheezing  CV: no obvious cyanosis  MS: moves all visible extremities without noticeable abnormality  PSYCH/NEURO: pleasant and cooperative, no obvious depression or anxiety, speech and thought processing grossly intact  ASSESSMENT AND PLAN:  Discussed the following assessment and plan:  Problem List Items Addressed This Visit       Other   Dysuria - Primary    Patient started on empiric Keflex 09/25/2022 with continued urinary symptoms.  Fortunately no systemic features.  I do suspect sitting after foot surgery may be exacerbating low back pain as well.  Explained the importance of obtaining urine specimen prior to antibiotic therapy for her safety.  Patient verbalized understanding.  Based on documented UTI in August 2021, I will start [place] today.  Patient is coming in today to leave urine studies with [place].  I reiterated the importance of probiotics.       Relevant Medications   nitrofurantoin, macrocrystal-monohydrate, (MACROBID) 100 MG capsule   Other Relevant Orders   Urinalysis, Routine w reflex microscopic   Urine Culture    -we discussed possible serious and likely etiologies, options for evaluation and workup, limitations of telemedicine visit vs in person visit, treatment, treatment risks and precautions. Pt prefers to treat via telemedicine empirically rather then risking or undertaking an in person visit at this moment.  .   I discussed the assessment and treatment plan with the patient. The patient was provided an opportunity to ask questions and all were answered. The patient agreed with the plan  and demonstrated an understanding of the instructions.   The patient was advised to call back or seek an in-person evaluation if the symptoms worsen or if the condition fails to improve as anticipated.   Mable Paris, FNP

## 2022-10-08 NOTE — Addendum Note (Signed)
Addended by: Neta Ehlers on: 10/08/2022 01:53 PM   Modules accepted: Orders

## 2022-10-13 ENCOUNTER — Other Ambulatory Visit: Payer: Self-pay | Admitting: Family

## 2022-10-13 ENCOUNTER — Encounter: Payer: Self-pay | Admitting: Family

## 2022-10-13 DIAGNOSIS — M62838 Other muscle spasm: Secondary | ICD-10-CM

## 2022-10-13 MED ORDER — BACLOFEN 10 MG PO TABS: 10.0000 mg | ORAL_TABLET | Freq: Two times a day (BID) | ORAL | 5 refills | Status: DC | PRN

## 2022-10-20 ENCOUNTER — Telehealth (INDEPENDENT_AMBULATORY_CARE_PROVIDER_SITE_OTHER): Payer: Managed Care, Other (non HMO) | Admitting: Family

## 2022-10-20 DIAGNOSIS — M4802 Spinal stenosis, cervical region: Secondary | ICD-10-CM

## 2022-10-20 DIAGNOSIS — K589 Irritable bowel syndrome without diarrhea: Secondary | ICD-10-CM | POA: Insufficient documentation

## 2022-10-20 NOTE — Assessment & Plan Note (Signed)
Symptoms have completely resolved since elimination of lactose. She is no longer taking amitiza. Will follow.

## 2022-10-20 NOTE — Progress Notes (Signed)
Virtual Visit via Video Note  I connected with@  on 10/20/22 at  4:00 PM EST by a video enabled telemedicine application and verified that I am speaking with the correct person using two identifiers.  Location patient: home Location provider:work  Persons participating in the virtual visit: patient, provider  I discussed the limitations of evaluation and management by telemedicine and the availability of in person appointments. The patient expressed understanding and agreed to proceed.   HPI: IBS-  Improved. Seen by Dr Marius Ditch 04/12/22 for IBS alternating constipation and diarrhea.  Provided Linzess 145 samples. She is not taking amitiza 51mg bid as prescribed by Dr VMarius Ditch She has been paying more attention to what she is eating and diarrhea has improved with avoidance of milk, greasy foods.  Bloating and abdominal distention has resolved.   Neck pain- she is compliant with Gabapentin 300 mg 4 times daily, however she is only taking TID. She is not overly sedated on regimen.   She is compliant Cymbalta 60 mg twice daily, mobic 7.5 mg daily as needed, baclofen 10 mg twice daily as needed, tramadol 50 mg twice daily  Recent surgery left foot 09/10/2022, Dr FVickki Muff ROS: See pertinent positives and negatives per HPI.    EXAM:  VITALS per patient if applicable: LMP 041/32/4401(Exact Date) Comment: BSO BP Readings from Last 3 Encounters:  10/08/22 122/84  09/10/22 112/70  08/27/22 134/82   Wt Readings from Last 3 Encounters:  09/10/22 255 lb 12.8 oz (116 kg)  08/27/22 255 lb 12.8 oz (116 kg)  08/16/22 255 lb (115.7 kg)    GENERAL: alert, oriented, appears well and in no acute distress  HEENT: atraumatic, conjunttiva clear, no obvious abnormalities on inspection of external nose and ears  NECK: normal movements of the head and neck  LUNGS: on inspection no signs of respiratory distress, breathing rate appears normal, no obvious gross SOB, gasping or wheezing  CV: no obvious  cyanosis  MS: moves all visible extremities without noticeable abnormality  PSYCH/NEURO: pleasant and cooperative, no obvious depression or anxiety, speech and thought processing grossly intact  ASSESSMENT AND PLAN:  Discussed the following assessment and plan:  Problem List Items Addressed This Visit       Digestive   IBS (irritable bowel syndrome) - Primary    Symptoms have completely resolved since elimination of lactose. She is no longer taking amitiza. Will follow.         Musculoskeletal and Integument   Foraminal stenosis of cervical region    Chronic, improved. She is pleased with gabapentin '300mg'$  TID. Continue compliant Cymbalta 60 mg twice daily, mobic 7.5 mg daily as needed, baclofen 10 mg twice daily as needed, tramadol 50 mg twice daily prn. Will follow.        -we discussed possible serious and likely etiologies, options for evaluation and workup, limitations of telemedicine visit vs in person visit, treatment, treatment risks and precautions. Pt prefers to treat via telemedicine empirically rather then risking or undertaking an in person visit at this moment.  .   I discussed the assessment and treatment plan with the patient. The patient was provided an opportunity to ask questions and all were answered. The patient agreed with the plan and demonstrated an understanding of the instructions.   The patient was advised to call back or seek an in-person evaluation if the symptoms worsen or if the condition fails to improve as anticipated.   MMable Paris FNP

## 2022-10-20 NOTE — Assessment & Plan Note (Addendum)
Chronic, improved. She is pleased with gabapentin '300mg'$  TID. Continue compliant Cymbalta 60 mg twice daily, mobic 7.5 mg daily as needed, baclofen 10 mg twice daily as needed, tramadol 50 mg twice daily prn. Will follow.

## 2022-10-20 NOTE — Patient Instructions (Signed)
Happy Holidays to you!

## 2022-11-03 ENCOUNTER — Encounter: Payer: Self-pay | Admitting: Family

## 2022-11-04 ENCOUNTER — Other Ambulatory Visit: Payer: Self-pay | Admitting: Family

## 2022-11-05 NOTE — Telephone Encounter (Signed)
Spoke to pt to inform her that a VV is fine.

## 2022-11-15 ENCOUNTER — Telehealth: Payer: Managed Care, Other (non HMO) | Admitting: Family Medicine

## 2022-11-15 DIAGNOSIS — G43809 Other migraine, not intractable, without status migrainosus: Secondary | ICD-10-CM

## 2022-11-15 DIAGNOSIS — R112 Nausea with vomiting, unspecified: Secondary | ICD-10-CM | POA: Diagnosis not present

## 2022-11-15 MED ORDER — ONDANSETRON 8 MG PO TBDP: ORAL_TABLET | ORAL | 0 refills | Status: DC

## 2022-11-15 NOTE — Addendum Note (Signed)
Addended by: Perlie Mayo on: 11/15/2022 02:43 PM   Modules accepted: Orders

## 2022-11-15 NOTE — Patient Instructions (Addendum)
Yvonne [name], thank you for joining Perlie Mayo, NP for today's virtual visit.  While this provider is not your primary care provider (PCP), if your PCP is located in our provider database this encounter information will be shared with them immediately following your visit.   [place] account gives you access to today's visit and all your visits, tests, and labs performed at [facility] " click here if you don't have a [facility] account or go to [email or web address]  Consent: (Patient) Yvonne [name] provided verbal consent for this virtual visit at the beginning of the encounter.  Current Medications:  Current Outpatient Medications:    acetaminophen (TYLENOL) 650 MG CR tablet, Take 650 mg by mouth every 8 (eight) hours as needed for pain., Disp: , Rfl:    albuterol (VENTOLIN HFA) 108 (90 Base) MCG/ACT inhaler, Inhale 1-2 puffs into the lungs as needed. (Patient taking differently: Inhale 1-2 puffs into the lungs as needed (was given when she had pneumonia in dec 2022).), Disp: 18 g, Rfl: 0   amLODipine (NORVASC) 5 MG tablet, Take 1 tablet (5 mg total) by mouth daily. (Patient taking differently: Take 5 mg by mouth every morning.), Disp: 90 tablet, Rfl: 3   baclofen (LIORESAL) 10 MG tablet, Take 1 tablet (10 mg total) by mouth 2 (two) times daily as needed for muscle spasms., Disp: 60 tablet, Rfl: 5   clindamycin (CLINDAGEL) 1 % gel, Apply topically 2 (two) times daily. (Patient not taking: Reported on 10/08/2022), Disp: 30 g, Rfl: 4   cycloSPORINE (RESTASIS) 0.05 % ophthalmic emulsion, Place 1 drop into both eyes 2 (two) times daily., Disp: , Rfl:    DULoxetine (CYMBALTA) 60 MG capsule, TAKE 1 CAPSULE TWICE A DAY, Disp: 180 capsule, Rfl: 3   fexofenadine (ALLEGRA ALLERGY) 180 MG tablet, Take 1 tablet (180 mg total) by mouth daily., Disp: 90 tablet, Rfl: 1   gabapentin (NEURONTIN) 300 MG capsule, Take 1 capsule (300 mg total) by mouth in the  morning, at noon, and at bedtime., Disp: 260 capsule, Rfl: 3   hydroxychloroquine (PLAQUENIL) 200 MG tablet, Take 200 mg by mouth 2 (two) times daily., Disp: , Rfl:    latanoprost (XALATAN) 0.005 % ophthalmic solution, Place 1 drop into both eyes at bedtime., Disp: , Rfl:    lubiprostone (AMITIZA) 8 MCG capsule, TAKE 1 CAPSULE BY MOUTH TWICE DAILY WITH A MEAL (Patient not taking: Reported on 10/20/2022), Disp: 180 capsule, Rfl: 2   Melatonin 10 MG CAPS, Take 1 capsule by mouth at bedtime., Disp: , Rfl:    meloxicam (MOBIC) 7.5 MG tablet, TAKE 1 TABLET DAILY WITH FOOD AS NEEDED FOR PAIN (Patient taking differently: Take 7.5 mg by mouth daily as needed.), Disp: 90 tablet, Rfl: 3   montelukast (SINGULAIR) 10 MG tablet, TAKE 1 TABLET AT BEDTIME (Patient taking differently: Take 10 mg by mouth at bedtime.), Disp: 90 tablet, Rfl: 3   nortriptyline (PAMELOR) 10 MG capsule, TAKE 1 CAPSULE AT BEDTIME (Patient taking differently: Take 10 mg by mouth at bedtime.), Disp: 90 capsule, Rfl: 3   omeprazole (PRILOSEC) 20 MG capsule, TAKE 1 CAPSULE DAILY, Disp: 90 capsule, Rfl: 3   ondansetron (ZOFRAN-ODT) 8 MG disintegrating tablet, PLACE 1 TABLET ON THE TONGUE TWICE A DAY AS NEEDED FOR NAUSEA OR VOMITING AND MIGRAINE, Disp: 30 tablet, Rfl: 23   propranolol (INDERAL) 40 MG tablet, TAKE 1 TABLET TWICE A DAY, Disp: 180 tablet, Rfl: 3   RINVOQ 15 MG TB24, Take  1 tablet by mouth every morning. Pt to hold this for upcoming surgery beginning 09-03-22 per rheumatologist-Last dose on 09-02-22, Disp: , Rfl:    SUMAtriptan (IMITREX) 50 MG tablet, TAKE 1 TABLET BY MOUTH ONCE FOR 1 DOSE. MAY REPEAT IN 2 HOURS IF HEADACHE PERSISTS OR RECURS, Disp: 30 tablet, Rfl: 2   timolol (TIMOPTIC) 0.25 % ophthalmic solution, Place 1 drop into both eyes daily., Disp: , Rfl:    traMADol (ULTRAM) 50 MG tablet, TAKE 1 TABLET BY MOUTH EVERY 12 HOURS AS NEEDED, Disp: 60 tablet, Rfl: 2   Medications ordered in this encounter:  No orders of the  defined types were placed in this encounter.    *If you need refills on other medications prior to your next appointment, please contact your pharmacy*  Follow-Up: Call back or seek an in-person evaluation if the symptoms worsen or if the condition fails to improve as anticipated.  [facility] [phone]  Other Instructions  [facility] Diet A bland diet may consist of soft foods or foods that are not high in fat or are not greasy, acidic, or spicy. Avoiding certain foods may cause less irritation to your mouth, throat, stomach, or gastrointestinal tract. Avoiding certain foods may make you feel better. Everyone's tolerances are different. A bland diet should be based on what you can tolerate and what may cause discomfort. What is my plan? Your health care provider or dietitian may recommend specific changes to your diet to treat your symptoms. These changes may include: Eating small meals frequently. Cooking food until it is soft enough to chew easily. Taking the time to chew your food thoroughly, so it is easy to swallow and digest. Avoiding foods that cause you discomfort. These may include spicy food, fried food, greasy foods, hard-to-chew foods, or citrus fruits and juices. Drinking slowly. What are tips for following this plan? Reading food labels To reduce fiber intake, look for food labels that say "whole," such as whole wheat or whole grain. Shopping Avoid food items that may have nuts or seeds. Avoid vegetables that may make you gassy or have a tough texture, such as broccoli, cauliflower, or corn. Cooking Cook foods thoroughly so they have a soft texture. Meal planning Make sure you include foods from all food groups to eat a balanced diet. Eat a variety of types of foods. Eat foods and drink beverages that do not cause you discomfort. These may include soups and broths with cooked meats, pasta, and vegetables. Lifestyle Sit up after meals, avoid tight  clothing, and take time to eat and chew your food slowly. Ask your health care provider whether you should take dietary supplements. General information Mildly season your foods. Some seasonings, such as cayenne pepper, vinegar, or hot sauce, may cause irritation. The foods, beverages, or seasonings to avoid should be based on individual tolerance. What foods should I eat? Fruits Canned or cooked fruit such as peaches, pears, or applesauce. Bananas. Vegetables Well-cooked vegetables. Canned or cooked vegetables such as carrots, green beans, beets, or spinach. Mashed or boiled potatoes. Grains  Hot cereals, such as cream of wheat and processed oatmeal. Rice. Bread, crackers, pasta, or tortillas made from refined white flour. Meats and other proteins  Eggs. Creamy peanut butter or other nut butters. Lean, well-cooked tender meats, such as beef, pork, chicken, or fish. Dairy Low-fat dairy products such as milk, cottage cheese, or yogurt. Beverages  Water. Herbal tea. Apple juice. Fats and oils Mild salad dressings. Canola or olive oil.  Sweets and desserts Low-fat pudding, custard, or ice cream. Fruit gelatin. The items listed above may not be a complete list of foods and beverages you can eat. Contact a dietitian for more information. What foods should I avoid? Fruits Citrus fruits, such as oranges and grapefruit. Fruits with a stringy texture. Fruits that have lots of seeds, such as kiwi or strawberries. Dried fruits. Vegetables Raw, uncooked vegetables. Salads. Grains Whole grain breads, muffins, and cereals. Meats and other proteins Tough, fibrous meats. Highly seasoned meat such as corned beef, smoked meats, or fish. Processed high-fat meats such as brats, hot dogs, or sausage. Dairy Full-fat dairy foods such as ice cream and cheese. Beverages Caffeinated drinks. Alcohol. Seasonings and condiments Strongly flavored seasonings or condiments. Hot sauce. Salsa. Other  foods Spicy foods. Fried or greasy foods. Sour foods, such as pickled or fermented foods like sauerkraut. Foods high in fiber. The items listed above may not be a complete list of foods and beverages you should avoid. Contact a dietitian for more information. Summary A bland diet should be based on individual tolerance. It may consist of foods that are soft textured and do not have a lot of fat, fiber, acid, or seasonings. A bland diet may be recommended because avoiding certain foods, beverages, or spices may make you feel better. This information is not intended to replace advice given to you by your health care provider. Make sure you discuss any questions you have with your health care provider. Document Revised: 10/05/2021 Document Reviewed: 10/05/2021 Elsevier Patient Education  [place].   If you have been instructed to have an in-person evaluation today at a local Urgent Care facility, please use the link below. It will take you to a list of all of our available [facility], including address, phone number and hours of operation. Please do not delay care.  [facility]  If you or a family member do not have a primary care provider, use the link below to schedule a visit and establish care. When you choose a [facility] primary care physician or advanced practice provider, you gain a long-term partner in health. Find a Primary Care Provider  Learn more about [facility]'s in-office and virtual care options: [facility]

## 2022-11-15 NOTE — Progress Notes (Signed)
Virtual Visit Consent   Yvonne [name], you are scheduled for a virtual visit with a [facility] provider today. Just as with appointments in the office, your consent must be obtained to participate. Your consent will be active for this visit and any virtual visit you may have with one of our providers in the next 365 days. If you have a MyChart account, a copy of this consent can be sent to you electronically.  As this is a virtual visit, video technology does not allow for your provider to perform a traditional examination. This may limit your provider's ability to fully assess your condition. If your provider identifies any concerns that need to be evaluated in person or the need to arrange testing (such as labs, EKG, etc.), we will make arrangements to do so. Although advances in technology are sophisticated, we cannot ensure that it will always work on either your end or our end. If the connection with a video visit is poor, the visit may have to be switched to a telephone visit. With either a video or telephone visit, we are not always able to ensure that we have a secure connection.  By engaging in this virtual visit, you consent to the provision of healthcare and authorize for your insurance to be billed (if applicable) for the services provided during this visit. Depending on your insurance coverage, you may receive a charge related to this service.  I need to obtain your verbal consent now. Are you willing to proceed with your visit today? SARAFINA [name] has provided verbal consent on 11/15/2022 for a virtual visit (video or telephone). Perlie Mayo, NP  Date: 11/15/2022 2:08 PM  Virtual Visit via Video Note   I, Perlie Mayo, connected with  Yvonne [name]  ([id number], August 29, 1966) on 11/15/22 at  2:30 PM EST by a video-enabled telemedicine application and verified that I am speaking with the correct person using two identifiers.  Location: Patient: Virtual Visit Location Patient:  Home Provider: Virtual Visit Location Provider: Home Office   I discussed the limitations of evaluation and management by telemedicine and the availability of in person appointments. The patient expressed understanding and agreed to proceed.    History of Present Illness: Yvonne [name] is a 56 y.o. who identifies as a female who was assigned female at birth, and is being seen today for flu like illness- throwing up, diarrhea, cold chills, body aches, headache- unsure of fevers. Denies known sick contacts, chest pain, shortness of breath, congestion, cough.  Problems:  Patient Active Problem List   Diagnosis Date Noted   IBS (irritable bowel syndrome) 10/20/2022   Dysuria 10/08/2022   Paronychia of finger 08/30/2022   B12 deficiency 08/23/2022   Hepatic steatosis 02/01/2022   Thyroid nodule 01/25/2022   Bronchitis 11/10/2021   Left foot pain 10/21/2021   Columnar epithelial-lined lower esophagus    Gastric erythema    History of colon polyps    Rectal polyp    Erythema of colon    Sinusitis 09/09/2021   Alternating constipation and diarrhea 07/29/2021   Special screening for malignant neoplasms, colon    Polyp of colon    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   COVID-19 12/19/2020   Cervical radicular pain 07/10/2020   Cervical facet joint syndrome 07/10/2020   Lumbar spondylosis 07/10/2020   Chronic pain syndrome 07/10/2020   Atherosclerosis of aorta ([place]) 03/25/2020   Arthralgia 03/19/2020   Restless leg 01/01/2020   Hyperglycemia 10/28/2019  Morbid obesity with body mass index (BMI) of 40.0 or higher ([place]) 04/29/2019   Migraines 03/29/2019   Primary osteoarthritis of both knees 01/26/2019   Screen for colon cancer 01/19/2019   Encounter for long-term (current) use of high-risk medication 01/19/2019   Non-seasonal allergic rhinitis 01/19/2019   Rheumatoid factor positive 01/17/2019   Prediabetes 12/15/2018   Chronic pain of both knees 11/17/2018   Right  wrist pain 11/17/2018   GERD (gastroesophageal reflux disease) 03/31/2018   Skin lesion 03/01/2018   Hayfever 02/08/2018   Chronic nonintractable headache 02/08/2018   Hypertension 02/08/2018   Menopausal hot flushes 02/08/2018    Allergies:  Allergies  Allergen Reactions   Dried Figs [Ficus] Anaphylaxis    Tongue tingling & felt like throat was swelling.     Flonase [Fluticasone Propionate]     Increases eye pressure    Sulfa Antibiotics Swelling   Tussionex Pennkinetic Er [Hydrocod Poli-Chlorphe Poli Er] Itching    Hyperactive    Medications:  Current Outpatient Medications:    acetaminophen (TYLENOL) 650 MG CR tablet, Take 650 mg by mouth every 8 (eight) hours as needed for pain., Disp: , Rfl:    albuterol (VENTOLIN HFA) 108 (90 Base) MCG/ACT inhaler, Inhale 1-2 puffs into the lungs as needed. (Patient taking differently: Inhale 1-2 puffs into the lungs as needed (was given when she had pneumonia in dec 2022).), Disp: 18 g, Rfl: 0   amLODipine (NORVASC) 5 MG tablet, Take 1 tablet (5 mg total) by mouth daily. (Patient taking differently: Take 5 mg by mouth every morning.), Disp: 90 tablet, Rfl: 3   baclofen (LIORESAL) 10 MG tablet, Take 1 tablet (10 mg total) by mouth 2 (two) times daily as needed for muscle spasms., Disp: 60 tablet, Rfl: 5   clindamycin (CLINDAGEL) 1 % gel, Apply topically 2 (two) times daily. (Patient not taking: Reported on 10/08/2022), Disp: 30 g, Rfl: 4   cycloSPORINE (RESTASIS) 0.05 % ophthalmic emulsion, Place 1 drop into both eyes 2 (two) times daily., Disp: , Rfl:    DULoxetine (CYMBALTA) 60 MG capsule, TAKE 1 CAPSULE TWICE A DAY, Disp: 180 capsule, Rfl: 3   fexofenadine (ALLEGRA ALLERGY) 180 MG tablet, Take 1 tablet (180 mg total) by mouth daily., Disp: 90 tablet, Rfl: 1   gabapentin (NEURONTIN) 300 MG capsule, Take 1 capsule (300 mg total) by mouth in the morning, at noon, and at bedtime., Disp: 260 capsule, Rfl: 3   hydroxychloroquine (PLAQUENIL) 200 MG  tablet, Take 200 mg by mouth 2 (two) times daily., Disp: , Rfl:    latanoprost (XALATAN) 0.005 % ophthalmic solution, Place 1 drop into both eyes at bedtime., Disp: , Rfl:    lubiprostone (AMITIZA) 8 MCG capsule, TAKE 1 CAPSULE BY MOUTH TWICE DAILY WITH A MEAL (Patient not taking: Reported on 10/20/2022), Disp: 180 capsule, Rfl: 2   Melatonin 10 MG CAPS, Take 1 capsule by mouth at bedtime., Disp: , Rfl:    meloxicam (MOBIC) 7.5 MG tablet, TAKE 1 TABLET DAILY WITH FOOD AS NEEDED FOR PAIN (Patient taking differently: Take 7.5 mg by mouth daily as needed.), Disp: 90 tablet, Rfl: 3   montelukast (SINGULAIR) 10 MG tablet, TAKE 1 TABLET AT BEDTIME (Patient taking differently: Take 10 mg by mouth at bedtime.), Disp: 90 tablet, Rfl: 3   nortriptyline (PAMELOR) 10 MG capsule, TAKE 1 CAPSULE AT BEDTIME (Patient taking differently: Take 10 mg by mouth at bedtime.), Disp: 90 capsule, Rfl: 3   omeprazole (PRILOSEC) 20 MG capsule, TAKE 1 CAPSULE  DAILY, Disp: 90 capsule, Rfl: 3   ondansetron (ZOFRAN-ODT) 8 MG disintegrating tablet, PLACE 1 TABLET ON THE TONGUE TWICE A DAY AS NEEDED FOR NAUSEA OR VOMITING AND MIGRAINE, Disp: 30 tablet, Rfl: 23   propranolol (INDERAL) 40 MG tablet, TAKE 1 TABLET TWICE A DAY, Disp: 180 tablet, Rfl: 3   RINVOQ 15 MG TB24, Take 1 tablet by mouth every morning. Pt to hold this for upcoming surgery beginning 09-03-22 per rheumatologist-Last dose on 09-02-22, Disp: , Rfl:    SUMAtriptan (IMITREX) 50 MG tablet, TAKE 1 TABLET BY MOUTH ONCE FOR 1 DOSE. MAY REPEAT IN 2 HOURS IF HEADACHE PERSISTS OR RECURS, Disp: 30 tablet, Rfl: 2   timolol (TIMOPTIC) 0.25 % ophthalmic solution, Place 1 drop into both eyes daily., Disp: , Rfl:    traMADol (ULTRAM) 50 MG tablet, TAKE 1 TABLET BY MOUTH EVERY 12 HOURS AS NEEDED, Disp: 60 tablet, Rfl: 2  Observations/Objective: Patient is well-developed, well-nourished in no acute distress.  Resting comfortably  at home.  Head is normocephalic, atraumatic.  No  labored breathing.  Speech is clear and coherent with logical content.  Patient is alert and oriented at baseline.    Assessment and Plan:  1. Nausea and vomiting, unspecified vomiting type  -rest -hydrate -use Zofran as ordered -OTC as discussed -follow up if unable to keep anything down -bland diet reviewed and on avs   Reviewed side effects, risks and benefits of medication.    Patient acknowledged agreement and understanding of the plan.   Past Medical, Surgical, Social History, Allergies, and Medications have been Reviewed.    Follow Up Instructions: I discussed the assessment and treatment plan with the patient. The patient was provided an opportunity to ask questions and all were answered. The patient agreed with the plan and demonstrated an understanding of the instructions.  A copy of instructions were sent to the patient via MyChart unless otherwise noted below.    The patient was advised to call back or seek an in-person evaluation if the symptoms worsen or if the condition fails to improve as anticipated.  Time:  I spent 10 minutes with the patient via telehealth technology discussing the above problems/concerns.    Perlie Mayo, NP

## 2022-11-23 ENCOUNTER — Encounter: Payer: Self-pay | Admitting: Family

## 2022-11-29 ENCOUNTER — Encounter: Payer: Self-pay | Admitting: Family

## 2022-11-30 ENCOUNTER — Other Ambulatory Visit: Payer: Self-pay | Admitting: Family

## 2022-11-30 DIAGNOSIS — M2550 Pain in unspecified joint: Secondary | ICD-10-CM

## 2022-11-30 DIAGNOSIS — G43809 Other migraine, not intractable, without status migrainosus: Secondary | ICD-10-CM

## 2022-11-30 MED ORDER — MELOXICAM 7.5 MG PO TABS: 7.5000 mg | ORAL_TABLET | Freq: Every day | ORAL | 0 refills | Status: DC | PRN

## 2022-11-30 MED ORDER — SUMATRIPTAN SUCCINATE 50 MG PO TABS: ORAL_TABLET | ORAL | 2 refills | Status: DC

## 2022-12-07 ENCOUNTER — Encounter: Payer: Self-pay | Admitting: Family

## 2022-12-07 NOTE — Telephone Encounter (Signed)
Spoke to pt and informed her that I changed appt on 1/19 to a MCVV

## 2022-12-17 ENCOUNTER — Encounter: Payer: Self-pay | Admitting: Family

## 2022-12-17 ENCOUNTER — Telehealth: Payer: Managed Care, Other (non HMO) | Admitting: Family

## 2022-12-17 DIAGNOSIS — J0100 Acute maxillary sinusitis, unspecified: Secondary | ICD-10-CM | POA: Diagnosis not present

## 2022-12-17 DIAGNOSIS — E538 Deficiency of other specified B group vitamins: Secondary | ICD-10-CM

## 2022-12-17 DIAGNOSIS — G894 Chronic pain syndrome: Secondary | ICD-10-CM | POA: Diagnosis not present

## 2022-12-17 DIAGNOSIS — I10 Essential (primary) hypertension: Secondary | ICD-10-CM

## 2022-12-17 MED ORDER — TRAMADOL HCL 50 MG PO TABS: 50.0000 mg | ORAL_TABLET | Freq: Two times a day (BID) | ORAL | 2 refills | Status: DC | PRN

## 2022-12-17 NOTE — Progress Notes (Signed)
Virtual Visit via Video Note  I connected with Yvonne [name] on 12/20/22 at  3:00 PM EST by a video enabled telemedicine application and verified that I am speaking with the correct person using two identifiers. Location patient: home Location provider: work  Persons participating in the virtual visit: patient, provider  I discussed the limitations of evaluation and management by telemedicine and the availability of in person appointments. The patient expressed understanding and agreed to proceed.  HPI: She complains of nasal congestion which brown in color x 6 days, unchanged.  No blood in congestion. Compliant with allegra. She started dayquil and theraflu otc.  Endorses sinus pressure, ears are popping, PND, occasional cough, wheezing.   No fever, sob  Negative covid at home.     She requests tramadol refill.  Medication is working well for her and helpful .   pain is most bothersome with both wrists, ankles and feet. She doesn't feel overly sedating.   She is walking  and no restrictions on activity.  She is compliant plaquenil prescribed by Dr Posey Pronto.  Recent left foot surgery 10/23 She takes meloxicam 7.5 mg as needed   ROS: See pertinent positives and negatives per HPI.  EXAM:  VITALS per patient if applicable: LMP 93/81/8299 (Exact Date) Comment: BSO BP Readings from Last 3 Encounters:  10/08/22 122/84  09/10/22 112/70  08/27/22 134/82   Wt Readings from Last 3 Encounters:  09/10/22 255 lb 12.8 oz (116 kg)  08/27/22 255 lb 12.8 oz (116 kg)  08/16/22 255 lb (115.7 kg)    GENERAL: alert, oriented, appears well and in no acute distress  HEENT: atraumatic, conjunttiva clear, no obvious abnormalities on inspection of external nose and ears  NECK: normal movements of the head and neck  LUNGS: on inspection no signs of respiratory distress, breathing rate appears normal, no obvious gross SOB, gasping or wheezing  CV: no obvious cyanosis  MS: moves all visible  extremities without noticeable abnormality  PSYCH/NEURO: pleasant and cooperative, no obvious depression or anxiety, speech and thought processing grossly intact  ASSESSMENT AND PLAN: Acute non-recurrent maxillary sinusitis Assessment & Plan: Afebrile. She declines antibiotic therapy as she would  We discussed starting doxycycline if symptoms do not resolve.    Chronic pain syndrome Assessment & Plan: Chronic, at baseline. Continue Cymbalta 60 mg twice daily,meloxicam 7.5 mg, tramadol '50mg'$  bid prn.   I looked up patient on [place] Controlled Substances Reporting System PMP AWARE and saw no activity that raised concern of inappropriate use.    Orders: -     traMADol HCl; Take 1 tablet (50 mg total) by mouth every 12 (twelve) hours as needed.  Dispense: 60 tablet; Refill: 2  B12 deficiency -     VITAMIN D 25 Hydroxy (Vit-D Deficiency, Fractures); Future  Hypertension, unspecified type Assessment & Plan: Chronic, stable. Continue amlodipine '5mg'$  qd       -we discussed possible serious and likely etiologies, options for evaluation and workup, limitations of telemedicine visit vs in person visit, treatment, treatment risks and precautions. Pt prefers to treat via telemedicine empirically rather then risking or undertaking an in person visit at this moment.    I discussed the assessment and treatment plan with the patient. The patient was provided an opportunity to ask questions and all were answered. The patient agreed with the plan and demonstrated an understanding of the instructions.   The patient was advised to call back or seek an in-person evaluation if the symptoms worsen or if  the condition fails to improve as anticipated.  Advised if desired AVS can be mailed or viewed via [place] if [place] user.   Mable Paris, FNP

## 2022-12-17 NOTE — Assessment & Plan Note (Signed)
Afebrile. She declines antibiotic therapy as she would  We discussed starting doxycycline if symptoms do not resolve.

## 2022-12-17 NOTE — Patient Instructions (Addendum)
Please come in for b12 injections.   Please let me know if sinus congestion doesn't resolve.

## 2022-12-20 ENCOUNTER — Encounter: Payer: Self-pay | Admitting: Family

## 2022-12-20 ENCOUNTER — Other Ambulatory Visit: Payer: Self-pay | Admitting: Family

## 2022-12-20 DIAGNOSIS — J0100 Acute maxillary sinusitis, unspecified: Secondary | ICD-10-CM

## 2022-12-20 MED ORDER — DOXYCYCLINE HYCLATE 100 MG PO TABS: 100.0000 mg | ORAL_TABLET | Freq: Two times a day (BID) | ORAL | 0 refills | Status: AC

## 2022-12-20 NOTE — Assessment & Plan Note (Addendum)
Chronic, at baseline. Continue Cymbalta 60 mg twice daily,meloxicam 7.5 mg, tramadol '50mg'$  bid prn.   I looked up patient on [place] Controlled Substances Reporting System PMP AWARE and saw no activity that raised concern of inappropriate use.

## 2022-12-20 NOTE — Assessment & Plan Note (Signed)
Chronic, stable.  Continue amlodipine 5 mg qd 

## 2023-01-04 ENCOUNTER — Telehealth: Payer: Self-pay

## 2023-01-04 NOTE — Telephone Encounter (Signed)
Left voicemail for patient asking her to please call us to schedule her follow-up appointments from her MyChart Visit with Yvonne [name], Yvonne [name].  Patient had a MyChart Visit with Yvonne Paris, FNP, on 12/17/2022.  Patient needs to be scheduled for a 4-monthfollow-up, weekly B12 injections for four weeks, then monthly B12 injections.  Patient needs to have her Vitamin D checked at the time of her first B12 injection.

## 2023-01-06 ENCOUNTER — Other Ambulatory Visit: Payer: Self-pay | Admitting: Surgery

## 2023-01-06 ENCOUNTER — Encounter: Payer: Self-pay | Admitting: Family

## 2023-01-06 DIAGNOSIS — E041 Nontoxic single thyroid nodule: Secondary | ICD-10-CM

## 2023-01-10 ENCOUNTER — Ambulatory Visit: Payer: Managed Care, Other (non HMO)

## 2023-01-10 ENCOUNTER — Encounter: Payer: Self-pay | Admitting: Family

## 2023-01-11 ENCOUNTER — Encounter: Payer: Self-pay | Admitting: Family

## 2023-01-12 ENCOUNTER — Other Ambulatory Visit: Payer: Self-pay | Admitting: Family

## 2023-01-12 DIAGNOSIS — J0100 Acute maxillary sinusitis, unspecified: Secondary | ICD-10-CM

## 2023-01-12 MED ORDER — DOXYCYCLINE HYCLATE 100 MG PO TABS: 100.0000 mg | ORAL_TABLET | Freq: Two times a day (BID) | ORAL | 0 refills | Status: DC

## 2023-01-13 ENCOUNTER — Encounter: Payer: Self-pay | Admitting: Family

## 2023-01-14 ENCOUNTER — Other Ambulatory Visit: Payer: Self-pay | Admitting: Family

## 2023-01-14 ENCOUNTER — Encounter: Payer: Self-pay | Admitting: Family

## 2023-01-14 DIAGNOSIS — J0100 Acute maxillary sinusitis, unspecified: Secondary | ICD-10-CM

## 2023-01-14 MED ORDER — AMOXICILLIN-POT CLAVULANATE ER 1000-62.5 MG PO TB12: 2.0000 | ORAL_TABLET | Freq: Two times a day (BID) | ORAL | 0 refills | Status: AC

## 2023-01-17 ENCOUNTER — Other Ambulatory Visit: Payer: Self-pay

## 2023-01-17 ENCOUNTER — Ambulatory Visit (INDEPENDENT_AMBULATORY_CARE_PROVIDER_SITE_OTHER): Payer: Managed Care, Other (non HMO)

## 2023-01-17 DIAGNOSIS — G43809 Other migraine, not intractable, without status migrainosus: Secondary | ICD-10-CM

## 2023-01-17 DIAGNOSIS — R42 Dizziness and giddiness: Secondary | ICD-10-CM

## 2023-01-17 DIAGNOSIS — E538 Deficiency of other specified B group vitamins: Secondary | ICD-10-CM | POA: Diagnosis not present

## 2023-01-17 MED ORDER — SUMATRIPTAN SUCCINATE 50 MG PO TABS: ORAL_TABLET | ORAL | 2 refills | Status: DC

## 2023-01-17 MED ORDER — FEXOFENADINE HCL 180 MG PO TABS: 180.0000 mg | ORAL_TABLET | Freq: Every day | ORAL | 1 refills | Status: DC

## 2023-01-17 MED ORDER — CYANOCOBALAMIN 1000 MCG/ML IJ SOLN
1000.0000 ug | Freq: Once | INTRAMUSCULAR | Status: AC
  Administered 2023-01-17: 1000 ug via INTRAMUSCULAR

## 2023-01-17 NOTE — Progress Notes (Signed)
Pt presented for their vitamin B12 injection. Pt was identified through two identifiers. Pt tolerated shot well in their left  deltoid.  

## 2023-02-17 ENCOUNTER — Ambulatory Visit: Payer: Managed Care, Other (non HMO)

## 2023-02-17 DIAGNOSIS — E538 Deficiency of other specified B group vitamins: Secondary | ICD-10-CM | POA: Diagnosis not present

## 2023-02-17 MED ORDER — CYANOCOBALAMIN 1000 MCG/ML IJ SOLN
1000.0000 ug | Freq: Once | INTRAMUSCULAR | Status: AC
  Administered 2023-02-17: 1000 ug via INTRAMUSCULAR

## 2023-02-17 NOTE — Progress Notes (Signed)
Patient presented for B 12 injection to right deltoid, patient voiced no concerns nor showed any signs of distress during injection. 

## 2023-02-23 ENCOUNTER — Other Ambulatory Visit: Payer: Self-pay

## 2023-02-23 ENCOUNTER — Encounter: Payer: Self-pay | Admitting: Family

## 2023-02-23 DIAGNOSIS — R42 Dizziness and giddiness: Secondary | ICD-10-CM

## 2023-02-23 MED ORDER — FEXOFENADINE HCL 180 MG PO TABS: 180.0000 mg | ORAL_TABLET | Freq: Every day | ORAL | 1 refills | Status: DC

## 2023-02-25 ENCOUNTER — Encounter: Payer: Self-pay | Admitting: Family

## 2023-02-28 ENCOUNTER — Other Ambulatory Visit: Payer: Self-pay | Admitting: Family

## 2023-02-28 DIAGNOSIS — G2581 Restless legs syndrome: Secondary | ICD-10-CM

## 2023-02-28 MED ORDER — GABAPENTIN 300 MG PO CAPS: 300.0000 mg | ORAL_CAPSULE | Freq: Three times a day (TID) | ORAL | 3 refills | Status: DC

## 2023-03-18 ENCOUNTER — Encounter: Payer: Self-pay | Admitting: Family

## 2023-03-18 ENCOUNTER — Ambulatory Visit (INDEPENDENT_AMBULATORY_CARE_PROVIDER_SITE_OTHER): Payer: Managed Care, Other (non HMO) | Admitting: Family

## 2023-03-18 VITALS — BP 126/80 | HR 70 | Temp 98.0°F | Ht 62.0 in | Wt 261.0 lb

## 2023-03-18 DIAGNOSIS — E669 Obesity, unspecified: Secondary | ICD-10-CM | POA: Insufficient documentation

## 2023-03-18 DIAGNOSIS — Z6841 Body Mass Index (BMI) 40.0 and over, adult: Secondary | ICD-10-CM | POA: Diagnosis not present

## 2023-03-18 DIAGNOSIS — Z1231 Encounter for screening mammogram for malignant neoplasm of breast: Secondary | ICD-10-CM

## 2023-03-18 DIAGNOSIS — E538 Deficiency of other specified B group vitamins: Secondary | ICD-10-CM

## 2023-03-18 DIAGNOSIS — R7303 Prediabetes: Secondary | ICD-10-CM

## 2023-03-18 DIAGNOSIS — G2581 Restless legs syndrome: Secondary | ICD-10-CM

## 2023-03-18 DIAGNOSIS — E041 Nontoxic single thyroid nodule: Secondary | ICD-10-CM

## 2023-03-18 DIAGNOSIS — R5383 Other fatigue: Secondary | ICD-10-CM

## 2023-03-18 MED ORDER — WEGOVY 0.25 MG/0.5ML ~~LOC~~ SOAJ: 0.2500 mg | SUBCUTANEOUS | 1 refills | Status: DC

## 2023-03-18 MED ORDER — GABAPENTIN 300 MG PO CAPS: 300.0000 mg | ORAL_CAPSULE | Freq: Four times a day (QID) | ORAL | 3 refills | Status: DC

## 2023-03-18 NOTE — Progress Notes (Unsigned)
Assessment & Plan:  Class 3 severe obesity with serious comorbidity and body mass index (BMI) of 45.0 to 49.9 in adult, unspecified obesity type -     Hemoglobin A1c -     Insulin, random -     Ambulatory referral to Pulmonology [facility]; Inject 0.25 mg into the skin once a week.  Dispense: 2 mL; Refill: 1  Encounter for screening mammogram for malignant neoplasm of breast -     3D Screening Mammogram, Left and Right; Future  Restless leg -     Gabapentin; Take 1 capsule (300 mg total) by mouth 4 (four) times daily.  Dispense: 120 capsule; Refill: 3  B12 deficiency -     B12 and Folate Panel  Thyroid nodule -     TSH  Other fatigue Assessment & Plan: Etiology unknown at this time.  Discussed likely multifactorial.  Referral to pulmonology for evaluation of sleep apnea.  No formal exercise.  Suspect deconditioning playing a role.  Obesity.  Concern for sedating medications include gabapentin 300 mg 3 times daily , tramadol  BID. she is not B12 deficient after B12 IM supplementation.Discuss polypharmacy in greater detail at follow-up    Prediabetes Assessment & Plan: BMI 47 Lab Results  Component Value Date   HGBA1C 5.6 03/18/2023   Fortunately patient is not prediabetic or insulin resistant at this time.  Discussed fatigue r/t deconditioning, obesity.  Patient has history of IBS-C.  Long discussion as it relates to trial of GLP-1 and vigilance to constipation.  Trial of Wegovy.  Counseled on side effects, blackbox warning as a relates to medullary thyroid cancer ,multiple endocrine neoplasia.    Other orders -     Extra Specimen     Return precautions given.   Risks, benefits, and alternatives of the medications and treatment plan prescribed today were discussed, and patient expressed understanding.   Education regarding symptom management and diagnosis given to patient on AVS either electronically or printed.  Return for Complete Physical Exam.  Yvonne Plowman, FNP  Subjective:    Patient ID: Yvonne [name], female    DOB: 10/24/1966, 57 y.o.   MRN: [id number]  CC: Yvonne [name] is a 57 y.o. female who presents today for follow up.   HPI: She describes irritability , decreased energy, and trouble concentrating.  She is frustrated by weight.   She feels the b12 can be helpful for energy but seems to wear off.   Sleep is not restorative  No fever, weight loss  No thoughts of harming herself.  She endorses increased anxiety.      Previously on paxil.  Tried metformin and phentermine in the past.   Enegery and mood improves with walking. Sleep is not restorative  She has h/o IBS -C. She is no longer on linzess as caused diarrhea. She is using colace with relief.   She is taking gabapentin 300 mg 4 times a day.  She is compliant with Cymbalta 60 mg bid  History of B12 deficiency.  Last B12 injection 01/17/2023.   following with rheumatology Dr. Allena [name], last seen 12/20/2022.  She is compliant with Plaquenil and Rinvoq.  She is taking tramadol twice daily  Mammogram is up to date  No personal or family history of thyroid cancer, multiple endocrine neoplasia  Lab Results  Component Value Date   TSH 1.14 03/18/2023    Allergies: Dried figs [ficus], Flonase [fluticasone propionate], Sulfa antibiotics, and Tussionex pennkinetic er [employer]  poli-chlorphe poli er] Current Outpatient Medications on File Prior to Visit  Medication Sig Dispense Refill   acetaminophen (TYLENOL) 650 MG CR tablet Take 650 mg by mouth every 8 (eight) hours as needed for pain.     albuterol (VENTOLIN HFA) 108 (90 Base) MCG/ACT inhaler Inhale 1-2 puffs into the lungs as needed. (Patient taking differently: Inhale 1-2 puffs into the lungs as needed (was given when she had pneumonia in dec 2022).) 18 g 0   amLODipine (NORVASC) 5 MG tablet Take 1 tablet (5 mg total) by mouth daily. (Patient taking differently: Take 5 mg by mouth every morning.) 90 tablet 3    cholecalciferol (VITAMIN D3) 25 MCG (1000 UNIT) tablet Take 1,000 Units by mouth daily.     cycloSPORINE (RESTASIS) 0.05 % ophthalmic emulsion Place 1 drop into both eyes 2 (two) times daily.     DULoxetine (CYMBALTA) 60 MG capsule TAKE 1 CAPSULE TWICE A DAY 180 capsule 3   fexofenadine (ALLEGRA ALLERGY) 180 MG tablet Take 1 tablet (180 mg total) by mouth daily. 90 tablet 1   hydroxychloroquine (PLAQUENIL) 200 MG tablet Take 200 mg by mouth 2 (two) times daily.     latanoprost (XALATAN) 0.005 % ophthalmic solution Place 1 drop into both eyes at bedtime.     Melatonin 10 MG CAPS Take 1 capsule by mouth at bedtime.     meloxicam (MOBIC) 7.5 MG tablet Take 1 tablet (7.5 mg total) by mouth daily as needed for pain. 90 tablet 0   montelukast (SINGULAIR) 10 MG tablet TAKE 1 TABLET AT BEDTIME (Patient taking differently: Take 10 mg by mouth at bedtime.) 90 tablet 3   nortriptyline (PAMELOR) 10 MG capsule TAKE 1 CAPSULE AT BEDTIME (Patient taking differently: Take 10 mg by mouth at bedtime.) 90 capsule 3   omeprazole (PRILOSEC) 20 MG capsule TAKE 1 CAPSULE DAILY 90 capsule 3   ondansetron (ZOFRAN-ODT) 8 MG disintegrating tablet PLACE 1 TABLET ON THE TONGUE TWICE A DAY AS NEEDED FOR NAUSEA OR VOMITING AND MIGRAINE 15 tablet 0   propranolol (INDERAL) 40 MG tablet TAKE 1 TABLET TWICE A DAY 180 tablet 3   RINVOQ 15 MG TB24 Take 1 tablet by mouth every morning. Pt to hold this for upcoming surgery beginning 09-03-22 per rheumatologist-Last dose on 09-02-22     SUMAtriptan (IMITREX) 50 MG tablet TAKE 1 TABLET BY MOUTH ONCE FOR 1 DOSE. MAY REPEAT IN 2 HOURS IF HEADACHE PERSISTS OR RECURS 30 tablet 2   timolol (TIMOPTIC) 0.25 % ophthalmic solution Place 1 drop into both eyes daily.     traMADol (ULTRAM) 50 MG tablet Take 1 tablet (50 mg total) by mouth every 12 (twelve) hours as needed. 60 tablet 2   No current facility-administered medications on file prior to visit.    Review of Systems  Constitutional:   Positive for fatigue. Negative for chills and fever.  Respiratory:  Negative for cough.   Cardiovascular:  Negative for chest pain and palpitations.  Gastrointestinal:  Negative for nausea and vomiting.      Objective:    BP 126/80   Pulse 70   Temp 98 F (36.7 C) (Oral)   Ht  (1.575 m)   Wt 261 lb (118.4 kg)   LMP 05/06/2017 (Exact Date) Comment: BSO  SpO2 98%   BMI 47.74 kg/m  BP Readings from Last 3 Encounters:  03/18/23 126/80  10/08/22 122/84  09/10/22 112/70   Wt Readings from Last 3 Encounters:  03/18/23 261 lb (118.4  kg)  09/10/22 255 lb 12.8 oz (116 kg)  08/27/22 255 lb 12.8 oz (116 kg)    Physical Exam Vitals reviewed.  Constitutional:      Appearance: She is well-developed.  Eyes:     Conjunctiva/sclera: Conjunctivae normal.  Cardiovascular:     Rate and Rhythm: Normal rate and regular rhythm.     Pulses: Normal pulses.     Heart sounds: Normal heart sounds.  Pulmonary:     Effort: Pulmonary effort is normal.     Breath sounds: Normal breath sounds. No wheezing, rhonchi or rales.  Skin:    General: Skin is warm and dry.  Neurological:     Mental Status: She is alert.  Psychiatric:        Speech: Speech normal.        Behavior: Behavior normal.        Thought Content: Thought content normal.

## 2023-03-18 NOTE — Patient Instructions (Addendum)
Please call  and schedule your 3D mammogram and /or bone density scan as we discussed.   [facility]  ( new location in 2023)  [address], [place], [place] [address]  [place], [place]  [phone] .  Please call Dr Gerrit Friends, Careers adviser in [place].  You are due for thyroid ultrasound after biopsy   We have discussed starting non insulin daily injectable medication called Wegovy  which is a glucagon like peptide (GLP 1) agonist and works by delaying gastric emptying and increasing insulin secretion.It is given once per week. Most patients see significant weight loss with this drug class.   You may NOT take either medication if you or your family has history of thyroid, parathyroid, OR adrenal cancer. Please confirm you and your family does NOT have this history as this drug class has black box warning on this medication for that reason.   Please follow  directions on prescription and slowly increase from 0.25mg  [place] once per week ;stay here for 4 weeks. You may then increase to 0.5mg  [place] once per week and stay there for 4 weeks.  We can slowly titrate further at follow up with goal of no more than 1-2 lbs weight loss per week.  Semaglutide [facility])  Dose (mg) Once Weekly Titration:    If a dose is not tolerated, consider delaying further dose increases for  another 4 weeks.  If you are actively losing weight on a dose, do not increase medication.   Weeks 1 through 4  0.25 mg once weekly  Weeks 5 through 8  0.5 mg once weekly  Weeks 9 though 12  1 mg once weekly  Weeks 13 through 16  1.7 mg once weekly   Semaglutide Injection (Weight Management) What is this medication? SEMAGLUTIDE (SEM a GLOO tide) promotes weight loss. It may also be used to maintain weight loss. It works by decreasing appetite. Changes to diet and exercise are often combined with this medication. This medicine may be used for other purposes; ask your health care provider or pharmacist  if you have questions. COMMON BRAND NAME(S): BJYNWG What should I tell my care team before I take this medication? They need to know if you have any of these conditions: Endocrine tumors (MEN 2) or if someone in your family had these tumors Eye disease, vision problems Gallbladder disease History of depression or mental health disease History of pancreatitis Kidney disease Stomach or intestine problems Suicidal thoughts, plans, or attempt; a previous suicide attempt by you or a family member Thyroid cancer or if someone in your family had thyroid cancer An unusual or allergic reaction to semaglutide, other medications, foods, dyes, or preservatives Pregnant or trying to get pregnant Breast-feeding How should I use this medication? This medication is injected under the skin. You will be taught how to prepare and give it. Take it as directed on the prescription label. It is given once every week (every 7 days). Keep taking it unless your care team tells you to stop. It is important that you put your used needles and pens in a special sharps container. Do not put them in a trash can. If you do not have a sharps container, call your pharmacist or care team to get one. A special MedGuide will be given to you by the pharmacist with each prescription and refill. Be sure to read this information carefully each time. This medication comes with INSTRUCTIONS FOR USE. Ask your pharmacist for directions on how to use  this medication. Read the information carefully. Talk to your pharmacist or care team if you have questions. Talk to your care team about the use of this medication in children. While it may be prescribed for children as young as 12 years for selected conditions, precautions do apply. Overdosage: If you think you have taken too much of this medicine contact a poison control center or emergency room at once. NOTE: This medicine is only for you. Do not share this medicine with others. What if I  miss a dose? If you miss a dose and the next scheduled dose is more than 2 days away, take the missed dose as soon as possible. If you miss a dose and the next scheduled dose is less than 2 days away, do not take the missed dose. Take the next dose at your regular time. Do not take double or extra doses. If you miss your dose for 2 weeks or more, take the next dose at your regular time or call your care team to talk about how to restart this medication. What may interact with this medication? Insulin and other medications for diabetes This list may not describe all possible interactions. Give your health care provider a list of all the medicines, herbs, non-prescription drugs, or dietary supplements you use. Also tell them if you smoke, drink alcohol, or use illegal drugs. Some items may interact with your medicine. What should I watch for while using this medication? Visit your care team for regular checks on your progress. It may be some time before you see the benefit from this medication. Drink plenty of fluids while taking this medication. Check with your care team if you have severe diarrhea, nausea, and vomiting, or if you sweat a lot. The loss of too much body fluid may make it dangerous for you to take this medication. This medication may affect blood sugar levels. Ask your care team if changes in diet or medications are needed if you have diabetes. If you or your family notice any changes in your behavior, such as new or worsening depression, thoughts of harming yourself, anxiety, other unusual or disturbing thoughts, or memory loss, call your care team right away. Women should inform their care team if they wish to become pregnant or think they might be pregnant. Losing weight while pregnant is not advised and may cause harm to the unborn child. Talk to your care team for more information. What side effects may I notice from receiving this medication? Side effects that you should report to  your care team as soon as possible: Allergic reactions--skin rash, itching, hives, swelling of the face, lips, tongue, or throat Change in vision Dehydration--increased thirst, dry mouth, feeling faint or lightheaded, headache, dark yellow or brown urine Gallbladder problems--severe stomach pain, nausea, vomiting, fever Heart palpitations--rapid, pounding, or irregular heartbeat Kidney injury--decrease in the amount of urine, swelling of the ankles, hands, or feet Pancreatitis--severe stomach pain that spreads to your back or gets worse after eating or when touched, fever, nausea, vomiting Thoughts of suicide or self-harm, worsening mood, feelings of depression Thyroid cancer--new mass or lump in the neck, pain or trouble swallowing, trouble breathing, hoarseness Side effects that usually do not require medical attention (report to your care team if they continue or are bothersome): Diarrhea Loss of appetite Nausea Stomach pain Vomiting This list may not describe all possible side effects. Call your doctor for medical advice about side effects. You may report side effects to FDA at 1-800-FDA-1088. Where should  I keep my medication? Keep out of the reach of children and pets. Refrigeration (preferred): Store in the refrigerator. Do not freeze. Keep this medication in the original container until you are ready to take it. Get rid of any unused medication after the expiration date. Room temperature: If needed, prior to cap removal, the pen can be stored at room temperature for up to 28 days. Protect from light. If it is stored at room temperature, get rid of any unused medication after 28 days or after it expires, whichever is first. It is important to get rid of the medication as soon as you no longer need it or it is expired. You can do this in two ways: Take the medication to a medication take-back program. Check with your pharmacy or law enforcement to find a location. If you cannot return  the medication, follow the directions in the MedGuide. NOTE: This sheet is a summary. It may not cover all possible information. If you have questions about this medicine, talk to your doctor, pharmacist, or health care provider.  2023 Elsevier/Gold Standard (2021-01-29 00:00:00)

## 2023-03-19 LAB — TSH: TSH: 1.14 mIU/L

## 2023-03-19 LAB — EXTRA SPECIMEN

## 2023-03-19 LAB — HEMOGLOBIN A1C
Hgb A1c MFr Bld: 5.6 % of total Hgb (ref <5.7)
eAG (mmol/L): 6.3 mmol/L

## 2023-03-21 ENCOUNTER — Encounter: Payer: Self-pay | Admitting: Family

## 2023-03-21 ENCOUNTER — Ambulatory Visit: Payer: Managed Care, Other (non HMO)

## 2023-03-21 DIAGNOSIS — R5383 Other fatigue: Secondary | ICD-10-CM | POA: Insufficient documentation

## 2023-03-21 LAB — HEMOGLOBIN A1C: Mean Plasma Glucose: 114 mg/dL

## 2023-03-21 LAB — B12 AND FOLATE PANEL
Folate: 11.1 ng/mL
Vitamin B-12: 397 pg/mL (ref 200–1100)

## 2023-03-21 LAB — INSULIN, RANDOM: Insulin: 14.9 u[IU]/mL

## 2023-03-21 LAB — EXTRA SPECIMEN

## 2023-03-21 NOTE — Assessment & Plan Note (Signed)
BMI 47 Lab Results  Component Value Date   HGBA1C 5.6 03/18/2023   Fortunately patient is not prediabetic or insulin resistant at this time.  Discussed fatigue r/t deconditioning, obesity.  Patient has history of IBS-C.  Long discussion as it relates to trial of GLP-1 and vigilance to constipation.  Trial of Wegovy.  Counseled on side effects, blackbox warning as a relates to medullary thyroid cancer ,multiple endocrine neoplasia.

## 2023-03-21 NOTE — Assessment & Plan Note (Addendum)
Etiology unknown at this time.  Discussed likely multifactorial.  Referral to pulmonology for evaluation of sleep apnea.  No formal exercise.  Suspect deconditioning playing a role.  Obesity.  Concern for sedating medications include gabapentin 300 mg 3 times daily , tramadol  BID. she is not B12 deficient after B12 IM supplementation.Discuss polypharmacy in greater detail at follow-up

## 2023-03-21 NOTE — Telephone Encounter (Signed)
Spoke to pt and explained that yes margaret will look at lab work first before she gets b12 in [employer]

## 2023-03-24 ENCOUNTER — Telehealth: Payer: Self-pay

## 2023-03-24 NOTE — Telephone Encounter (Signed)
Patient called back- patient has never had a sleep study.

## 2023-03-24 NOTE — Telephone Encounter (Signed)
Left message for patient. Need to know if she has had a previous sleep study.

## 2023-03-24 NOTE — Telephone Encounter (Signed)
Noted.  Will close encounter.  

## 2023-03-25 ENCOUNTER — Encounter: Payer: Self-pay | Admitting: Adult Health

## 2023-03-25 ENCOUNTER — Ambulatory Visit: Payer: Managed Care, Other (non HMO) | Admitting: Adult Health

## 2023-03-25 VITALS — BP 124/84 | HR 61 | Temp 97.5°F | Ht 62.0 in | Wt 267.0 lb

## 2023-03-25 DIAGNOSIS — G4700 Insomnia, unspecified: Secondary | ICD-10-CM

## 2023-03-25 DIAGNOSIS — R0683 Snoring: Secondary | ICD-10-CM | POA: Diagnosis not present

## 2023-03-25 NOTE — Assessment & Plan Note (Signed)
Loud snoring, restless sleep, insomnia, teeth grinding, headaches, BMI 48, high Epworth score all suspicious for underlying sleep apnea.  Set patient up for home sleep study.  Patient education was given.  - discussed how weight can impact sleep and risk for sleep disordered breathing - discussed options to assist with weight loss: combination of diet modification, cardiovascular and strength training exercises   - had an extensive discussion regarding the adverse health consequences related to untreated sleep disordered breathing - specifically discussed the risks for hypertension, coronary artery disease, cardiac dysrhythmias, cerebrovascular disease, and diabetes - lifestyle modification discussed   - discussed how sleep disruption can increase risk of accidents, particularly when driving - safe driving practices were discussed   Plan  Patient Instructions  Set up for home sleep study  Work on healthy weight loss  Do not drive if sleepy  Use caution with sedating medications  Healthy sleep regimen  Follow up in 6 weeks to discuss sleep study results and treatment plan .

## 2023-03-25 NOTE — Assessment & Plan Note (Signed)
Continue on current regimen.  Home sleep study is pending.

## 2023-03-25 NOTE — Assessment & Plan Note (Signed)
Healthy sleep regimen reviewed.  Advised to use caution with her multiple sedating medications.  Check home sleep study.

## 2023-03-25 NOTE — Assessment & Plan Note (Signed)
Healthy weight loss discussed 

## 2023-03-25 NOTE — Patient Instructions (Signed)
Set up for home sleep study  Work on healthy weight loss  Do not drive if sleepy  Use caution with sedating medications  Healthy sleep regimen  Follow up in 6 weeks to discuss sleep study results and treatment plan .

## 2023-03-25 NOTE — Progress Notes (Signed)
@Patient  ID: Yvonne [name], female    DOB: 1966-09-16, 57 y.o.   MRN: [id number]  Chief Complaint  Patient presents with   Consult    Referring provider: Allegra Grana, FNP  HPI: 57 year old female seen for sleep consult March 25, 2023 for snoring and daytime sleepiness Patient is followed for bronchitis by Dr. Sherene Sires Medical history significant for migraines, rheumatoid arthritis  TEST/EVENTS :   03/25/2023 Sleep consult  Patient presents for sleep consult today.  Kindly referred by her primary care provider Rennie Plowman, FNP.  Patient complains of loud snoring, restless sleep, daytime sleepiness, morning headaches, fatigue.  Goes to bed about 8:30 PM.  Goes to sleep pretty quickly.  Is up 2 or 3 times throughout the night.  Gets up at 6 AM.  Weight is up 70 pounds over the last 2 years.  Current weight is at 267 pounds with a BMI of 48.  Patient does not use significant caffeine.  No history of congestive heart failure or stroke.  Has never had a sleep study before.  Has no removable dental work.  Epworth score is 17 out of 24.  Typically gets sleepy if she sits down to watch TV or read.  Also in the evening hours. No symptoms suspicious for cataplexy or sleep paralysis.    Does not nap. Takes Melatonin,  Benadryl and dramamine  for insomnia . Works well. Grinds teeth -could not tolerate dental guard.  Takes Gabapentin for Restless syndrome . Has chronic back pain on baclofen each night .      03/25/2023    3:00 PM  Results of the Epworth flowsheet  Sitting and reading 2  Watching TV 3  Sitting, inactive in a public place (e.g. a theatre or a meeting) 3  As a passenger in a car for an hour without a break 3  Lying down to rest in the afternoon when circumstances permit 3  Sitting and talking to someone 1  Sitting quietly after a lunch without alcohol 1  In a car, while stopped for a few minutes in traffic 1  Total score 17    Social history patient is married lives at  home with her husband.  She does have children.  She quit smoking in 2002.  Social alcohol.  No drug use. Husband has sleep apnea on CPAP .   Family history positive for allergies.  Past Surgical History:  Procedure Laterality Date   CERVICAL POLYPECTOMY N/A 04/11/2017   Procedure: CERVICAL POLYPECTOMY;  Surgeon: Hildred Laser, MD;  Location: [facility];  Service: Gynecology;  Laterality: N/A;   COLONOSCOPY WITH PROPOFOL N/A 03/24/2021   Procedure: COLONOSCOPY WITH BIOPSY;  Surgeon: Pasty Spillers, MD;  Location: [facility];  Service: Endoscopy;  Laterality: N/A;   COLONOSCOPY WITH PROPOFOL N/A 09/18/2021   Procedure: COLONOSCOPY WITH PROPOFOL;  Surgeon: Pasty Spillers, MD;  Location: [facility];  Service: Endoscopy;  Laterality: N/A;   ESOPHAGOGASTRODUODENOSCOPY N/A 09/18/2021   Procedure: ESOPHAGOGASTRODUODENOSCOPY (EGD);  Surgeon: Pasty Spillers, MD;  Location: [facility];  Service: Endoscopy;  Laterality: N/A;   FOOT ARTHRODESIS Left 09/10/2022   Procedure: ARTHRODESIS FOOT;  Surgeon: Gwyneth Revels, DPM;  Location: [facility];  Service: Podiatry;  Laterality: Left;   HYSTEROSCOPY WITH D & C N/A 04/11/2017   Procedure: DILATATION AND CURETTAGE /HYSTEROSCOPY;  Surgeon: Hildred Laser, MD;  Location: [facility];  Service: Gynecology;  Laterality: N/A;   IUD REMOVAL N/A 05/05/2017   Procedure: INTRAUTERINE DEVICE (IUD) REMOVAL;  Surgeon: Hildred Laser, MD;  Location: [facility];  Service: Gynecology;  Laterality: N/A;   LAPAROSCOPIC SALPINGO OOPHERECTOMY Bilateral 05/05/2017   Procedure: LAPAROSCOPIC BILATERAL SALPINGO OOPHORECTOMY;  Surgeon: Hildred Laser, MD;  Location: [facility];  Service: Gynecology;  Laterality: Bilateral;   LAPAROSCOPY N/A 04/11/2017   Procedure: LAPAROSCOPY DIAGNOSTIC;  Surgeon: Hildred Laser, MD;  Location: [facility];  Service: Gynecology;  Laterality: N/A;   PARTIAL HYSTERECTOMY     Ovaries and Tubes   POLYPECTOMY N/A 03/24/2021   Procedure:  POLYPECTOMY;  Surgeon: Pasty Spillers, MD;  Location: [facility];  Service: Endoscopy;  Laterality: N/A;   TUBAL LIGATION       Allergies  Allergen Reactions   Dried Figs [Ficus] Anaphylaxis    Tongue tingling & felt like throat was swelling.     Flonase [Fluticasone Propionate]     Increases eye pressure    Sulfa Antibiotics Swelling   Tussionex Pennkinetic Er [Hydrocod Poli-Chlorphe Poli Er] Itching    Hyperactive     Immunization History  Administered Date(s) Administered   Influenza,inj,Quad PF,6+ Mos 09/11/2021   Influenza-Unspecified 08/15/2019   PFIZER Comirnaty(Gray Top)Covid-19 Tri-Sucrose Vaccine 02/22/2020, 03/14/2020, 01/12/2021   PFIZER(Purple Top)SARS-COV-2 Vaccination 02/22/2020, 03/14/2020, 01/12/2021   PNEUMOCOCCAL CONJUGATE-20 10/21/2021   Tdap 03/24/2017   Zoster Recombinat (Shingrix) 06/08/2021, 08/10/2021    Past Medical History:  Diagnosis Date   Allergy    Arthritis, rheumatoid (HCC)    Degenerative disc disease, cervical    GERD (gastroesophageal reflux disease)    Gestational diabetes    patient denies   Glaucoma    H/O bronchitis    Headache    migraine   History of ovarian cyst    Hypertension    pre-eclampsia with first child   Increased pressure in the eye, bilateral    Pneumonia 10/2021   PONV (postoperative nausea and vomiting)    Restless leg    Shingles 2021   Thyroid nodule     Tobacco History: Social History   Tobacco Use  Smoking Status Former   Packs/day: 1.00   Years: 6.00   Additional pack years: 0.00   Total pack years: 6.00   Types: Cigarettes   Quit date: 05/29/2000   Years since quitting: 22.8  Smokeless Tobacco Never  Tobacco Comments   1/2-1 PPD   Counseling given: Not Answered Tobacco comments: 1/2-1 PPD   Outpatient Medications Prior to Visit  Medication Sig Dispense Refill   acetaminophen (TYLENOL) 650 MG CR tablet Take 650 mg by mouth every 8 (eight) hours as needed for pain.      albuterol (VENTOLIN HFA) 108 (90 Base) MCG/ACT inhaler Inhale 1-2 puffs into the lungs as needed. (Patient taking differently: Inhale 1-2 puffs into the lungs as needed (was given when she had pneumonia in dec 2022).) 18 g 0   amLODipine (NORVASC) 5 MG tablet Take 1 tablet (5 mg total) by mouth daily. (Patient taking differently: Take 5 mg by mouth every morning.) 90 tablet 3   cholecalciferol (VITAMIN D3) 25 MCG (1000 UNIT) tablet Take 1,000 Units by mouth daily.     cycloSPORINE (RESTASIS) 0.05 % ophthalmic emulsion Place 1 drop into both eyes 2 (two) times daily.     DULoxetine (CYMBALTA) 60 MG capsule TAKE 1 CAPSULE TWICE A DAY 180 capsule 3   fexofenadine (ALLEGRA ALLERGY) 180 MG tablet Take 1 tablet (180 mg total) by mouth daily. 90 tablet 1   gabapentin (NEURONTIN) 300 MG capsule Take 1 capsule (300 mg total) by  mouth 4 (four) times daily. 120 capsule 3   hydroxychloroquine (PLAQUENIL) 200 MG tablet Take 200 mg by mouth 2 (two) times daily.     latanoprost (XALATAN) 0.005 % ophthalmic solution Place 1 drop into both eyes at bedtime.     Melatonin 10 MG CAPS Take 1 capsule by mouth at bedtime.     meloxicam (MOBIC) 7.5 MG tablet Take 1 tablet (7.5 mg total) by mouth daily as needed for pain. 90 tablet 0   montelukast (SINGULAIR) 10 MG tablet TAKE 1 TABLET AT BEDTIME (Patient taking differently: Take 10 mg by mouth at bedtime.) 90 tablet 3   nortriptyline (PAMELOR) 10 MG capsule TAKE 1 CAPSULE AT BEDTIME (Patient taking differently: Take 10 mg by mouth at bedtime.) 90 capsule 3   omeprazole (PRILOSEC) 20 MG capsule TAKE 1 CAPSULE DAILY 90 capsule 3   ondansetron (ZOFRAN-ODT) 8 MG disintegrating tablet PLACE 1 TABLET ON THE TONGUE TWICE A DAY AS NEEDED FOR NAUSEA OR VOMITING AND MIGRAINE 15 tablet 0   propranolol (INDERAL) 40 MG tablet TAKE 1 TABLET TWICE A DAY 180 tablet 3   RINVOQ 15 MG TB24 Take 1 tablet by mouth every morning. Pt to hold this for upcoming surgery beginning 09-03-22 per  rheumatologist-Last dose on 09-02-22     Semaglutide-Weight Management (WEGOVY) 0.25 MG/0.5ML SOAJ Inject 0.25 mg into the skin once a week. 2 mL 1   SUMAtriptan (IMITREX) 50 MG tablet TAKE 1 TABLET BY MOUTH ONCE FOR 1 DOSE. MAY REPEAT IN 2 HOURS IF HEADACHE PERSISTS OR RECURS 30 tablet 2   timolol (TIMOPTIC) 0.25 % ophthalmic solution Place 1 drop into both eyes daily.     traMADol (ULTRAM) 50 MG tablet Take 1 tablet (50 mg total) by mouth every 12 (twelve) hours as needed. 60 tablet 2   No facility-administered medications prior to visit.     Review of Systems:   Constitutional:   No  weight loss, night sweats,  Fevers, chills, + fatigue, or  lassitude.  HEENT:   No headaches,  Difficulty swallowing,  Tooth/dental problems, or  Sore throat,                No sneezing, itching, ear ache, nasal congestion, post nasal drip,   CV:  No chest pain,  Orthopnea, PND, swelling in lower extremities, anasarca, dizziness, palpitations, syncope.   GI  No heartburn, indigestion, abdominal pain, nausea, vomiting, diarrhea, change in bowel habits, loss of appetite, bloody stools.   Resp: No shortness of breath with exertion or at rest.  No excess mucus, no productive cough,  No non-productive cough,  No coughing up of blood.  No change in color of mucus.  No wheezing.  No chest wall deformity  Skin: no rash or lesions.  GU: no dysuria, change in color of urine, no urgency or frequency.  No flank pain, no hematuria   MS: back pain     Physical Exam  BP 124/84 (BP Location: Left Arm, Cuff Size: Large)   Pulse 61   Temp (!) 97.5 F (36.4 C)   Ht 5\' 2"  (1.575 m)   Wt 267 lb (121.1 kg)   LMP 05/06/2017 (Exact Date) Comment: BSO  SpO2 97%   BMI 48.83 kg/m   GEN: A/Ox3; pleasant , NAD, well nourished    HEENT:  [place]/AT,  , NOSE-clear, THROAT-clear, no lesions, no postnasal drip or exudate noted. Class 3-4 MP airway   NECK:  Supple w/ fair ROM; no JVD; normal carotid impulses  w/o bruits; no  thyromegaly or nodules palpated; no lymphadenopathy.    RESP  Clear  P & A; w/o, wheezes/ rales/ or rhonchi. no accessory muscle use, no dullness to percussion  CARD:  RRR, no m/r/g, no peripheral edema, pulses intact, no cyanosis or clubbing.  GI:   Soft & nt; nml bowel sounds; no organomegaly or masses detected.   Musco: Warm bil, no deformities or joint swelling noted.   Neuro: alert, no focal deficits noted.    Skin: Warm, no lesions or rashes    Lab Results:     BNP No results found for: "BNP"  ProBNP No results found for: "PROBNP"  Imaging: No results found.  cyanocobalamin (VITAMIN B12) injection 1,000 mcg     Date Action Dose Route User   02/17/2023 1637 Given 1,000 mcg Intramuscular (Right Deltoid) Sandy Salaam, CMA           No data to display          No results found for: "NITRICOXIDE"      Assessment & Plan:   Snoring Loud snoring, restless sleep, insomnia, teeth grinding, headaches, BMI 48, high Epworth score all suspicious for underlying sleep apnea.  Set patient up for home sleep study.  Patient education was given.  - discussed how weight can impact sleep and risk for sleep disordered breathing - discussed options to assist with weight loss: combination of diet modification, cardiovascular and strength training exercises   - had an extensive discussion regarding the adverse health consequences related to untreated sleep disordered breathing - specifically discussed the risks for hypertension, coronary artery disease, cardiac dysrhythmias, cerebrovascular disease, and diabetes - lifestyle modification discussed   - discussed how sleep disruption can increase risk of accidents, particularly when driving - safe driving practices were discussed   Plan  Patient Instructions  Set up for home sleep study  Work on healthy weight loss  Do not drive if sleepy  Use caution with sedating medications  Healthy sleep regimen  Follow up in  6 weeks to discuss sleep study results and treatment plan .     Restless leg Continue on current regimen.  Home sleep study is pending.  Morbid obesity with body mass index (BMI) of 40.0 or higher (HCC) Healthy weight loss discussed  Insomnia Healthy sleep regimen reviewed.  Advised to use caution with her multiple sedating medications.  Check home sleep study.     Rubye Oaks, NP 03/25/2023

## 2023-04-07 ENCOUNTER — Other Ambulatory Visit: Payer: Self-pay | Admitting: Family

## 2023-04-07 DIAGNOSIS — M62838 Other muscle spasm: Secondary | ICD-10-CM

## 2023-04-26 ENCOUNTER — Encounter: Payer: Self-pay | Admitting: Family

## 2023-04-27 ENCOUNTER — Other Ambulatory Visit: Payer: Self-pay | Admitting: Family

## 2023-04-27 DIAGNOSIS — G894 Chronic pain syndrome: Secondary | ICD-10-CM

## 2023-04-27 MED ORDER — TRAMADOL HCL 50 MG PO TABS
50.0000 mg | ORAL_TABLET | Freq: Two times a day (BID) | ORAL | 2 refills | Status: DC | PRN
Start: 2023-04-27 — End: 2023-08-08

## 2023-05-02 ENCOUNTER — Telehealth: Payer: Self-pay | Admitting: Pulmonary Disease

## 2023-05-02 ENCOUNTER — Encounter (INDEPENDENT_AMBULATORY_CARE_PROVIDER_SITE_OTHER): Payer: Managed Care, Other (non HMO)

## 2023-05-02 DIAGNOSIS — G4733 Obstructive sleep apnea (adult) (pediatric): Secondary | ICD-10-CM

## 2023-05-02 DIAGNOSIS — R0683 Snoring: Secondary | ICD-10-CM

## 2023-05-02 NOTE — Telephone Encounter (Addendum)
Call patient  Sleep study result  Date of study: 04/08/2023  Impression: Severe obstructive sleep apnea Moderately severe oxygen desaturations  Recommendation: DME referral  Recommend CPAP therapy for severe obstructive sleep apnea  Auto titrating CPAP with pressure settings of 5-20 will be appropriate  Encourage weight loss measures  Follow-up in the office 4 to 6 weeks following initiation of treatment

## 2023-05-05 ENCOUNTER — Encounter: Payer: Self-pay | Admitting: Adult Health

## 2023-05-05 ENCOUNTER — Telehealth: Payer: Managed Care, Other (non HMO) | Admitting: Adult Health

## 2023-05-05 DIAGNOSIS — G4733 Obstructive sleep apnea (adult) (pediatric): Secondary | ICD-10-CM

## 2023-05-05 NOTE — Telephone Encounter (Signed)
Will discuss at office visit May 05, 2023

## 2023-05-05 NOTE — Patient Instructions (Signed)
Begin CPAP therapy at bedtime.  Goal is to wear all night long for at least 6 or more hours Work on healthy weight loss  Do not drive if sleepy  Use caution with sedating medications  Healthy sleep regimen  Follow up in 3 months and as needed

## 2023-05-05 NOTE — Progress Notes (Signed)
Virtual Visit via Video Note  I connected with Yvonne [name] on 05/05/23 at  3:00 PM EDT by a video enabled telemedicine application and verified that I am speaking with the correct person using two identifiers.  Location: Patient: Home  Provider: Office    I discussed the limitations of evaluation and management by telemedicine and the availability of in person appointments. The patient expressed understanding and agreed to proceed.  History of Present Illness: 57 year old female seen for sleep consult March 25, 2023 for snoring and daytime sleepiness found to have severe obstructive sleep apnea Previously seen for Bronchitis by Dr. Sherene Sires  Medical history significant for migraines and rheumatoid arthritis  Today's video visit is discussed sleep study results.  Patient was seen March 25, 2023 for snoring and daytime sleepiness.  Patient was set up for home sleep study was done on Apr 08, 2023 that showed severe sleep apnea with AHI 43.1/hour and SpO2 low at 74%.  We discussed her sleep study results in detail.  Went over treatment options including weight loss, oral appliance and CPAP therapy.  With severity of sleep apnea patient will proceed with CPAP therapy.  . Past Medical History:  Diagnosis Date   Allergy    Arthritis, rheumatoid (HCC)    Degenerative disc disease, cervical    GERD (gastroesophageal reflux disease)    Gestational diabetes    patient denies   Glaucoma    H/O bronchitis    Headache    migraine   History of ovarian cyst    Hypertension    pre-eclampsia with first child   Increased pressure in the eye, bilateral    Pneumonia 10/2021   PONV (postoperative nausea and vomiting)    Restless leg    Shingles 2021   Thyroid nodule     Current Outpatient Medications on File Prior to Visit  Medication Sig Dispense Refill   acetaminophen (TYLENOL) 650 MG CR tablet Take 650 mg by mouth every 8 (eight) hours as needed for pain.     albuterol (VENTOLIN HFA) 108 (90  Base) MCG/ACT inhaler Inhale 1-2 puffs into the lungs as needed. (Patient taking differently: Inhale 1-2 puffs into the lungs as needed (was given when she had pneumonia in dec 2022).) 18 g 0   amLODipine (NORVASC) 5 MG tablet Take 1 tablet (5 mg total) by mouth daily. (Patient taking differently: Take 5 mg by mouth every morning.) 90 tablet 3   cholecalciferol (VITAMIN D3) 25 MCG (1000 UNIT) tablet Take 1,000 Units by mouth daily.     cycloSPORINE (RESTASIS) 0.05 % ophthalmic emulsion Place 1 drop into both eyes 2 (two) times daily.     DULoxetine (CYMBALTA) 60 MG capsule TAKE 1 CAPSULE TWICE A DAY 180 capsule 3   fexofenadine (ALLEGRA ALLERGY) 180 MG tablet Take 1 tablet (180 mg total) by mouth daily. 90 tablet 1   gabapentin (NEURONTIN) 300 MG capsule Take 1 capsule (300 mg total) by mouth 4 (four) times daily. 120 capsule 3   hydroxychloroquine (PLAQUENIL) 200 MG tablet Take 200 mg by mouth 2 (two) times daily.     latanoprost (XALATAN) 0.005 % ophthalmic solution Place 1 drop into both eyes at bedtime.     Melatonin 10 MG CAPS Take 1 capsule by mouth at bedtime.     meloxicam (MOBIC) 7.5 MG tablet Take 1 tablet (7.5 mg total) by mouth daily as needed for pain. 90 tablet 0   montelukast (SINGULAIR) 10 MG tablet TAKE 1 TABLET AT BEDTIME (Patient  taking differently: Take 10 mg by mouth at bedtime.) 90 tablet 3   nortriptyline (PAMELOR) 10 MG capsule TAKE 1 CAPSULE AT BEDTIME (Patient taking differently: Take 10 mg by mouth at bedtime.) 90 capsule 3   omeprazole (PRILOSEC) 20 MG capsule TAKE 1 CAPSULE DAILY 90 capsule 3   ondansetron (ZOFRAN-ODT) 8 MG disintegrating tablet PLACE 1 TABLET ON THE TONGUE TWICE A DAY AS NEEDED FOR NAUSEA OR VOMITING AND MIGRAINE 15 tablet 0   propranolol (INDERAL) 40 MG tablet TAKE 1 TABLET TWICE A DAY 180 tablet 3   RINVOQ 15 MG TB24 Take 1 tablet by mouth every morning. Pt to hold this for upcoming surgery beginning 09-03-22 per rheumatologist-Last dose on 09-02-22      Semaglutide-Weight Management (WEGOVY) 0.25 MG/0.5ML SOAJ Inject 0.25 mg into the skin once a week. 2 mL 1   SUMAtriptan (IMITREX) 50 MG tablet TAKE 1 TABLET BY MOUTH ONCE FOR 1 DOSE. MAY REPEAT IN 2 HOURS IF HEADACHE PERSISTS OR RECURS 30 tablet 2   timolol (TIMOPTIC) 0.25 % ophthalmic solution Place 1 drop into both eyes daily.     traMADol (ULTRAM) 50 MG tablet Take 1 tablet (50 mg total) by mouth every 12 (twelve) hours as needed. 60 tablet 2   No current facility-administered medications on file prior to visit.        Observations/Objective: Home sleep study Apr 08, 2023 showed severe sleep apnea with AHI 43.1/hour and SpO2 low at 74%.  Appears well without any acute distress noted  Assessment and Plan: Severe obstructive sleep apnea.  Patient education was given on sleep apnea.  Patient will begin auto CPAP 5 to 15 cm H2O. Advised to use sedating medications with caution.  - discussed how weight can impact sleep and risk for sleep disordered breathing - discussed options to assist with weight loss: combination of diet modification, cardiovascular and strength training exercises   - had an extensive discussion regarding the adverse health consequences related to untreated sleep disordered breathing - specifically discussed the risks for hypertension, coronary artery disease, cardiac dysrhythmias, cerebrovascular disease, and diabetes - lifestyle modification discussed   - discussed how sleep disruption can increase risk of accidents, particularly when driving - safe driving practices were discussed     Plan  Patient Instructions  Begin CPAP therapy at bedtime.  Goal is to wear all night long for at least 6 or more hours Work on healthy weight loss  Do not drive if sleepy  Use caution with sedating medications  Healthy sleep regimen  Follow up in 3 months and as needed     Follow Up Instructions:    I discussed the assessment and treatment plan with the patient. The  patient was provided an opportunity to ask questions and all were answered. The patient agreed with the plan and demonstrated an understanding of the instructions.   The patient was advised to call back or seek an in-person evaluation if the symptoms worsen or if the condition fails to improve as anticipated.  I provided 20  minutes of non-face-to-face time during this encounter.   Rubye Oaks, NP

## 2023-05-10 ENCOUNTER — Other Ambulatory Visit: Payer: Self-pay | Admitting: Family

## 2023-05-10 DIAGNOSIS — J3089 Other allergic rhinitis: Secondary | ICD-10-CM

## 2023-05-10 DIAGNOSIS — R519 Headache, unspecified: Secondary | ICD-10-CM

## 2023-05-10 DIAGNOSIS — M2550 Pain in unspecified joint: Secondary | ICD-10-CM

## 2023-06-08 NOTE — Telephone Encounter (Signed)
Appt scheduled

## 2023-06-15 ENCOUNTER — Other Ambulatory Visit: Payer: Self-pay | Admitting: Family

## 2023-06-15 DIAGNOSIS — I10 Essential (primary) hypertension: Secondary | ICD-10-CM

## 2023-06-22 ENCOUNTER — Ambulatory Visit: Payer: Managed Care, Other (non HMO) | Admitting: Adult Health

## 2023-06-23 ENCOUNTER — Ambulatory Visit: Payer: Managed Care, Other (non HMO) | Admitting: Adult Health

## 2023-07-07 ENCOUNTER — Other Ambulatory Visit: Payer: Self-pay | Admitting: Family

## 2023-07-07 DIAGNOSIS — G43809 Other migraine, not intractable, without status migrainosus: Secondary | ICD-10-CM

## 2023-07-12 ENCOUNTER — Encounter: Payer: Self-pay | Admitting: Family

## 2023-07-12 NOTE — Telephone Encounter (Signed)
Spoke to pt and scheduled appt for 08/08/23

## 2023-07-14 ENCOUNTER — Telehealth (INDEPENDENT_AMBULATORY_CARE_PROVIDER_SITE_OTHER): Payer: Managed Care, Other (non HMO) | Admitting: Adult Health

## 2023-07-14 ENCOUNTER — Encounter: Payer: Self-pay | Admitting: Adult Health

## 2023-07-14 ENCOUNTER — Encounter (INDEPENDENT_AMBULATORY_CARE_PROVIDER_SITE_OTHER): Payer: Self-pay

## 2023-07-14 DIAGNOSIS — G4733 Obstructive sleep apnea (adult) (pediatric): Secondary | ICD-10-CM | POA: Diagnosis not present

## 2023-07-14 NOTE — Progress Notes (Signed)
Virtual Visit via Video Note  I connected with Yvonne [name] on 07/14/23 at  8:30 AM EDT by a video enabled telemedicine application and verified that I am speaking with the correct person using two identifiers.  Location: Patient: Home  Provider: Office    I discussed the limitations of evaluation and management by telemedicine and the availability of in person appointments. The patient expressed understanding and agreed to proceed.  History of Present Illness: 57 year old female seen for sleep consult March 25, 2023 for snoring and daytime sleepiness found to have severe sleep apnea Medical history significant for Migraines, Rheumatoid arthritis   Today's video visit is a sleep apnea is a 45-month follow-up.  Patient was seen last visit after home sleep study done in May that showed severe sleep apnea.  She was started on CPAP therapy.  Patient says she is trying to get used to CPAP.  She has been trying different CPAP mask.  She is currently using a nasal mask.  Has tried the DreamWear full facemask but had too much condensation in the tubing and mask that made it difficult to tolerate.  She does feel like CPAP is really helping her with decreased daytime sleepiness.  She has more energy.  Says that she feels more like herself.  CPAP download showed excellent compliance with daily average usage at 8 hours.  Patient is on 5 to 15 cm H2O.  AHI 6.6/hour daily average pressure at 13.8 cm H2O. Currently taking Cymbalta, Neurontin, Pamelor, Ultram.  We discussed the potential sedating effects of these medications.  Past Medical History:  Diagnosis Date   Allergy    Arthritis, rheumatoid (HCC)    Degenerative disc disease, cervical    GERD (gastroesophageal reflux disease)    Gestational diabetes    patient denies   Glaucoma    H/O bronchitis    Headache    migraine   History of ovarian cyst    Hypertension    pre-eclampsia with first child   Increased pressure in the eye, bilateral     Pneumonia 10/2021   PONV (postoperative nausea and vomiting)    Restless leg    Shingles 2021   Thyroid nodule     Current Outpatient Medications on File Prior to Visit  Medication Sig Dispense Refill   acetaminophen (TYLENOL) 650 MG CR tablet Take 650 mg by mouth every 8 (eight) hours as needed for pain.     albuterol (VENTOLIN HFA) 108 (90 Base) MCG/ACT inhaler Inhale 1-2 puffs into the lungs as needed. (Patient taking differently: Inhale 1-2 puffs into the lungs as needed (was given when she had pneumonia in dec 2022).) 18 g 0   amLODipine (NORVASC) 5 MG tablet Take 1 tablet by mouth once daily 90 tablet 0   cholecalciferol (VITAMIN D3) 25 MCG (1000 UNIT) tablet Take 1,000 Units by mouth daily.     cycloSPORINE (RESTASIS) 0.05 % ophthalmic emulsion Place 1 drop into both eyes 2 (two) times daily.     DULoxetine (CYMBALTA) 60 MG capsule TAKE 1 CAPSULE TWICE A DAY 180 capsule 3   fexofenadine (ALLEGRA ALLERGY) 180 MG tablet Take 1 tablet (180 mg total) by mouth daily. 90 tablet 1   gabapentin (NEURONTIN) 300 MG capsule Take 1 capsule (300 mg total) by mouth 4 (four) times daily. 120 capsule 3   hydroxychloroquine (PLAQUENIL) 200 MG tablet Take 200 mg by mouth 2 (two) times daily.     latanoprost (XALATAN) 0.005 % ophthalmic solution Place 1 drop into  both eyes at bedtime.     Melatonin 10 MG CAPS Take 1 capsule by mouth at bedtime.     meloxicam (MOBIC) 7.5 MG tablet TAKE 1 TABLET BY MOUTH ONCE DAILY AS NEEDED FOR PAIN 90 tablet 1   montelukast (SINGULAIR) 10 MG tablet TAKE 1 TABLET BY MOUTH AT BEDTIME 90 tablet 3   nortriptyline (PAMELOR) 10 MG capsule Take 1 capsule by mouth at bedtime 90 capsule 3   omeprazole (PRILOSEC) 20 MG capsule TAKE 1 CAPSULE DAILY 90 capsule 3   ondansetron (ZOFRAN-ODT) 8 MG disintegrating tablet DISSOLVE 1 TABLET ON THE TOP OF THE TONGUE TWICE DAILY AS NEEDED FOR NAUSEA/VOMITING AND  MIGRAINE 30 tablet 0   propranolol (INDERAL) 40 MG tablet TAKE 1 TABLET TWICE A  DAY 180 tablet 3   RINVOQ 15 MG TB24 Take 1 tablet by mouth every morning. Pt to hold this for upcoming surgery beginning 09-03-22 per rheumatologist-Last dose on 09-02-22     Semaglutide-Weight Management (WEGOVY) 0.25 MG/0.5ML SOAJ Inject 0.25 mg into the skin once a week. 2 mL 1   SUMAtriptan (IMITREX) 50 MG tablet TAKE 1 TABLET BY MOUTH ONCE FOR 1 DOSE. MAY REPEAT IN 2 HOURS IF HEADACHE PERSISTS OR RECURS 30 tablet 2   timolol (TIMOPTIC) 0.25 % ophthalmic solution Place 1 drop into both eyes daily.     traMADol (ULTRAM) 50 MG tablet Take 1 tablet (50 mg total) by mouth every 12 (twelve) hours as needed. 60 tablet 2   No current facility-administered medications on file prior to visit.        Observations/Objective: Home sleep study was done on Apr 08, 2023 that showed severe sleep apnea with AHI 43.1/hour and SpO2 low at 74%   07/14/2023 NAD -appears well ,  Assessment and Plan: Severe obstructive sleep apnea-excellent control and compliance on CPAP.  She has had a significant decrease in number of sleep apnea events.  Will adjust CPAP pressure for comfort and hopefully to decrease further residual events.  Will send order to DME to decrease CPAP humidity.  Change to auto CPAP 8 to 16 cm H2O.  Morbid obesity - encouraged on healthy weight loss. That she is working on being more active.  Has a treadmill at home and has been started daily exercise  Plan  Patient Instructions  Continue on  CPAP therapy at bedtime.  Goal is to wear all night long for at least 6 or more hours Work on healthy weight loss  Do not drive if sleepy  Use caution with sedating medications  Healthy sleep regimen  DME Orders sent: Decrease CPAP humidity and change to Auto CPAP 8 to 16cmH2O.  Follow up in 4-6 months and as needed     Follow Up Instructions:    I discussed the assessment and treatment plan with the patient. The patient was provided an opportunity to ask questions and all were answered. The  patient agreed with the plan and demonstrated an understanding of the instructions.   The patient was advised to call back or seek an in-person evaluation if the symptoms worsen or if the condition fails to improve as anticipated.  I provided 22  minutes of non-face-to-face time during this encounter.   Rubye Oaks, NP

## 2023-07-14 NOTE — Patient Instructions (Signed)
Continue on  CPAP therapy at bedtime.  Goal is to wear all night long for at least 6 or more hours Work on healthy weight loss  Do not drive if sleepy  Use caution with sedating medications  Healthy sleep regimen  DME Orders sent: Decrease CPAP humidity and change to Auto CPAP 8 to 16cmH2O.  Follow up in 4-6 months and as needed

## 2023-07-18 ENCOUNTER — Other Ambulatory Visit: Payer: Self-pay

## 2023-07-18 ENCOUNTER — Encounter: Payer: Self-pay | Admitting: Family

## 2023-07-18 ENCOUNTER — Other Ambulatory Visit: Payer: Self-pay | Admitting: Family

## 2023-07-18 DIAGNOSIS — G43809 Other migraine, not intractable, without status migrainosus: Secondary | ICD-10-CM

## 2023-07-18 MED ORDER — SUMATRIPTAN SUCCINATE 50 MG PO TABS
ORAL_TABLET | ORAL | 2 refills | Status: DC
Start: 2023-07-18 — End: 2023-08-08

## 2023-07-19 ENCOUNTER — Other Ambulatory Visit: Payer: Self-pay | Admitting: Family

## 2023-07-19 DIAGNOSIS — M62838 Other muscle spasm: Secondary | ICD-10-CM

## 2023-07-21 NOTE — Progress Notes (Signed)
Reviewed and agree with assessment/plan.   Coralyn Helling, MD [facility] 07/21/2023, 10:03 AM Pager:  [phone]

## 2023-07-24 ENCOUNTER — Other Ambulatory Visit: Payer: Self-pay | Admitting: Family

## 2023-07-24 DIAGNOSIS — M62838 Other muscle spasm: Secondary | ICD-10-CM

## 2023-07-25 NOTE — Telephone Encounter (Signed)
LVM to call back to schedule earlier appt sent message via my chart as well

## 2023-07-25 NOTE — Telephone Encounter (Signed)
Medication was discontinued on 12/20/2022. Is it okay to refuse refill request?

## 2023-07-26 NOTE — Telephone Encounter (Signed)
Spoke with pt and she stated that she is still taking. Is it okay to refill?

## 2023-07-27 ENCOUNTER — Encounter: Payer: Self-pay | Admitting: Family

## 2023-07-27 DIAGNOSIS — G2581 Restless legs syndrome: Secondary | ICD-10-CM

## 2023-07-27 MED ORDER — GABAPENTIN 300 MG PO CAPS
300.0000 mg | ORAL_CAPSULE | Freq: Four times a day (QID) | ORAL | 3 refills | Status: DC
Start: 2023-07-27 — End: 2023-07-29

## 2023-07-29 ENCOUNTER — Other Ambulatory Visit: Payer: Self-pay | Admitting: Family

## 2023-07-29 DIAGNOSIS — G2581 Restless legs syndrome: Secondary | ICD-10-CM

## 2023-07-29 MED ORDER — GABAPENTIN 300 MG PO CAPS
300.0000 mg | ORAL_CAPSULE | Freq: Four times a day (QID) | ORAL | 3 refills | Status: DC
Start: 2023-07-29 — End: 2024-02-17

## 2023-08-08 ENCOUNTER — Encounter: Payer: Self-pay | Admitting: Family

## 2023-08-08 ENCOUNTER — Ambulatory Visit: Payer: Managed Care, Other (non HMO) | Admitting: Family

## 2023-08-08 VITALS — BP 130/76 | HR 78 | Temp 98.2°F | Ht 62.0 in | Wt 271.8 lb

## 2023-08-08 DIAGNOSIS — G894 Chronic pain syndrome: Secondary | ICD-10-CM | POA: Diagnosis not present

## 2023-08-08 DIAGNOSIS — G4733 Obstructive sleep apnea (adult) (pediatric): Secondary | ICD-10-CM | POA: Diagnosis not present

## 2023-08-08 DIAGNOSIS — G43809 Other migraine, not intractable, without status migrainosus: Secondary | ICD-10-CM

## 2023-08-08 DIAGNOSIS — J3089 Other allergic rhinitis: Secondary | ICD-10-CM

## 2023-08-08 DIAGNOSIS — M62838 Other muscle spasm: Secondary | ICD-10-CM

## 2023-08-08 MED ORDER — TRAMADOL HCL 50 MG PO TABS: 50.0000 mg | ORAL_TABLET | Freq: Two times a day (BID) | ORAL | 2 refills | Status: DC | PRN

## 2023-08-08 MED ORDER — OMEPRAZOLE 20 MG PO CPDR: 20.0000 mg | DELAYED_RELEASE_CAPSULE | Freq: Every day | ORAL | 3 refills | Status: DC

## 2023-08-08 MED ORDER — DULOXETINE HCL 60 MG PO CPEP
60.0000 mg | ORAL_CAPSULE | Freq: Two times a day (BID) | ORAL | 3 refills | Status: DC
Start: 2023-08-08 — End: 2023-09-02

## 2023-08-08 MED ORDER — BACLOFEN 10 MG PO TABS
5.0000 mg | ORAL_TABLET | Freq: Two times a day (BID) | ORAL | Status: DC
Start: 2023-08-08 — End: 2023-09-27

## 2023-08-08 MED ORDER — TOPIRAMATE 25 MG PO TABS
25.0000 mg | ORAL_TABLET | Freq: Every day | ORAL | 0 refills | Status: AC
Start: 2023-08-08 — End: ?

## 2023-08-08 MED ORDER — SUMATRIPTAN SUCCINATE 100 MG PO TABS
ORAL_TABLET | ORAL | 0 refills | Status: DC
Start: 2023-08-08 — End: 2023-09-29

## 2023-08-08 MED ORDER — LEVOCETIRIZINE DIHYDROCHLORIDE 5 MG PO TABS: 5.0000 mg | ORAL_TABLET | Freq: Every evening | ORAL | 3 refills | Status: DC

## 2023-08-08 NOTE — Patient Instructions (Addendum)
Wean off nortriptyline 10mg  every other night x 7 days, then every 3rd night x 7 days, then stop  Meanwhile, you may go ahead and start topamax 25mg  at bedtime. After 2-3 weeks, if needed increase to topamax 50mg  at bedtime.   We will continue to discuss medication changes such as Lyrica ( instead of gabapentin) , or Effexor ( instead of Cymbalta).   We will continue to discuss propranolol, gabapentin which can be sedating.   Stop afrin  Start Xyzal for allergies; STOP ALLEGRA as same drug class   Nice to see you!

## 2023-08-08 NOTE — Progress Notes (Unsigned)
ANNUAL PREVENTATIVE CARE GYNECOLOGY  ENCOUNTER NOTE  Subjective:       Yvonne [name] is a 57 y.o. G2P2 female here for a routine annual gynecologic exam. The patient {is/is not/has never been:13135} sexually active. The patient is not taking hormone replacement therapy. Patient denies post-menopausal vaginal bleeding. The patient wears seatbelts: {yes/no:311178}. The patient participates in regular exercise: {yes/no/not asked:9010}. Has the patient ever been transfused or tattooed?: {yes/no/not asked:9010}. The patient reports that there {is/is not:9024} domestic violence in her life.  Current complaints: 1.  ***    Gynecologic History Patient's last menstrual period was 04/01/2020 (exact date).Surgical menopause (bilateral oophorectomy in 2018) Contraception: post menopausal status Last Pap:  04/01/2020. Results were: normal Last mammogram: 12/23/2020. Results were: normal Last Colonoscopy: 09/18/2021. Every 7 years    Obstetric History OB History  Gravida Para Term Preterm AB Living  2 2       2   SAB IAB Ectopic Multiple Live Births          2    # Outcome Date GA Lbr Len/2nd Weight Sex Type Anes PTL Lv  2 Para 1995    F Vag-Spont   LIV  1 Para 1992    M Vag-Spont   LIV    Past Medical History:  Diagnosis Date   Allergy    Arthritis, rheumatoid (HCC)    Degenerative disc disease, cervical    GERD (gastroesophageal reflux disease)    Gestational diabetes    patient denies   Glaucoma    H/O bronchitis    Headache    migraine   History of ovarian cyst    Hypertension    pre-eclampsia with first child   Increased pressure in the eye, bilateral    Pneumonia 10/2021   PONV (postoperative nausea and vomiting)    Restless leg    Shingles 2021   Thyroid nodule     Family History  Problem Relation Age of Onset   Schizophrenia Mother    Bipolar disorder Mother    Alcohol abuse Father    Hypertension Father    Hypertension Brother    Breast cancer Brother     Hypertension Maternal Grandmother    Seizures Paternal Grandmother    Diabetes Paternal Grandmother    Thyroid cancer Neg Hx     Past Surgical History:  Procedure Laterality Date   CERVICAL POLYPECTOMY N/A 04/11/2017   Procedure: CERVICAL POLYPECTOMY;  Surgeon: Hildred Laser, MD;  Location: [facility];  Service: Gynecology;  Laterality: N/A;   COLONOSCOPY WITH PROPOFOL N/A 03/24/2021   Procedure: COLONOSCOPY WITH BIOPSY;  Surgeon: Pasty Spillers, MD;  Location: [facility];  Service: Endoscopy;  Laterality: N/A;   COLONOSCOPY WITH PROPOFOL N/A 09/18/2021   Procedure: COLONOSCOPY WITH PROPOFOL;  Surgeon: Pasty Spillers, MD;  Location: [facility] END[facility];  Service: Endoscopy;  Laterality: N/A;   ESOPHAGOGASTRODUODENOSCOPY N/A 09/18/2021   Procedure: ESOPHAGOGASTRODUODENOSCOPY (EGD);  Surgeon: Pasty Spillers, MD;  Location: [facility];  Service: Endoscopy;  Laterality: N/A;   FOOT ARTHRODESIS Left 09/10/2022   Procedure: ARTHRODESIS FOOT;  Surgeon: Gwyneth Revels, DPM;  Location: [facility];  Service: Podiatry;  Laterality: Left;   HYSTEROSCOPY WITH D & C N/A 04/11/2017   Procedure: DILATATION AND CURETTAGE /HYSTEROSCOPY;  Surgeon: Hildred Laser, MD;  Location: [facility];  Service: Gynecology;  Laterality: N/A;   IUD REMOVAL N/A 05/05/2017   Procedure: INTRAUTERINE DEVICE (IUD) REMOVAL;  Surgeon: Hildred Laser, MD;  Location: [facility];  Service: Gynecology;  Laterality:  N/A;   LAPAROSCOPIC SALPINGO OOPHERECTOMY Bilateral 05/05/2017   Procedure: LAPAROSCOPIC BILATERAL SALPINGO OOPHORECTOMY;  Surgeon: Hildred Laser, MD;  Location: [facility];  Service: Gynecology;  Laterality: Bilateral;   LAPAROSCOPY N/A 04/11/2017   Procedure: LAPAROSCOPY DIAGNOSTIC;  Surgeon: Hildred Laser, MD;  Location: [facility];  Service: Gynecology;  Laterality: N/A;   PARTIAL HYSTERECTOMY     Ovaries and Tubes   POLYPECTOMY N/A 03/24/2021   Procedure: POLYPECTOMY;  Surgeon: Pasty Spillers, MD;   Location: [facility];  Service: Endoscopy;  Laterality: N/A;   TUBAL LIGATION      Social History   Socioeconomic History   Marital status: Married    Spouse name: Not on file   Number of children: Not on file   Years of education: Not on file   Highest education level: 12th grade  Occupational History   Occupation: Civil engineer, contracting at [employer]  Tobacco Use   Smoking status: Former    Current packs/day: 0.00    Average packs/day: 1 pack/day for 6.0 years (6.0 ttl pk-yrs)    Types: Cigarettes    Start date: 05/29/1994    Quit date: 05/29/2000    Years since quitting: 23.2   Smokeless tobacco: Never   Tobacco comments:    1/2-1 PPD  Vaping Use   Vaping status: Never Used  Substance and Sexual Activity   Alcohol use: Yes    Comment: occ   Drug use: No   Sexual activity: Yes    Birth control/protection: Surgical  Other Topics Concern   Not on file  Social History Narrative   Working as Scientist, physiological at [employer]      Social Determinants of Health   Financial Resource Strain: Patient Declined (03/14/2023)   Overall Financial Resource Strain (CARDIA)    Difficulty of Paying Living Expenses: Patient declined  Food Insecurity: Patient Declined (03/14/2023)   Hunger Vital Sign    Worried About Running Out of Food in the Last Year: Patient declined    Ran Out of Food in the Last Year: Patient declined  Transportation Needs: No Transportation Needs (03/14/2023)   PRAPARE - Administrator, Civil Service (Medical): No    Lack of Transportation (Non-Medical): No  Physical Activity: Unknown (03/14/2023)   Exercise Vital Sign    Days of Exercise per Week: 0 days    Minutes of Exercise per Session: Not on file  Stress: Stress Concern Present (03/14/2023)   [employer] of Occupational Health - Occupational Stress Questionnaire    Feeling of Stress : To some extent  Social Connections: Unknown (03/14/2023)   Social Connection and Isolation  Panel [NHANES]    Frequency of Communication with Friends and Family: More than three times a week    Frequency of Social Gatherings with Friends and Family: Once a week    Attends Religious Services: Patient declined    Database administrator or Organizations: No    Attends Engineer, structural: Not on file    Marital Status: Married  Catering manager Violence: Not on file    Current Outpatient Medications on File Prior to Visit  Medication Sig Dispense Refill   acetaminophen (TYLENOL) 650 MG CR tablet Take 650 mg by mouth every 8 (eight) hours as needed for pain.     albuterol (VENTOLIN HFA) 108 (90 Base) MCG/ACT inhaler Inhale 1-2 puffs into the lungs as needed. (Patient taking differently: Inhale 1-2 puffs into the lungs as needed (was given when she had  pneumonia in dec 2022).) 18 g 0   amLODipine (NORVASC) 5 MG tablet Take 1 tablet by mouth once daily 90 tablet 0   baclofen (LIORESAL) 10 MG tablet Take 0.5 tablets (5 mg total) by mouth 2 (two) times daily.     cholecalciferol (VITAMIN D3) 25 MCG (1000 UNIT) tablet Take 1,000 Units by mouth daily.     cycloSPORINE (RESTASIS) 0.05 % ophthalmic emulsion Place 1 drop into both eyes 2 (two) times daily.     DULoxetine (CYMBALTA) 60 MG capsule Take 1 capsule (60 mg total) by mouth 2 (two) times daily. 180 capsule 3   gabapentin (NEURONTIN) 300 MG capsule Take 1 capsule (300 mg total) by mouth 4 (four) times daily. 120 capsule 3   hydroxychloroquine (PLAQUENIL) 200 MG tablet Take 200 mg by mouth 2 (two) times daily.     latanoprost (XALATAN) 0.005 % ophthalmic solution Place 1 drop into both eyes at bedtime.     levocetirizine (XYZAL) 5 MG tablet Take 1 tablet (5 mg total) by mouth every evening. 90 tablet 3   Melatonin 10 MG CAPS Take 1 capsule by mouth at bedtime.     meloxicam (MOBIC) 7.5 MG tablet TAKE 1 TABLET BY MOUTH ONCE DAILY AS NEEDED FOR PAIN 90 tablet 1   montelukast (SINGULAIR) 10 MG tablet TAKE 1 TABLET BY MOUTH AT  BEDTIME 90 tablet 3   omeprazole (PRILOSEC) 20 MG capsule Take 1 capsule (20 mg total) by mouth daily. 90 capsule 3   ondansetron (ZOFRAN-ODT) 8 MG disintegrating tablet DISSOLVE 1 TABLET ON THE TOP OF THE TONGUE TWICE DAILY AS NEEDED FOR NAUSEA/VOMITING AND  MIGRAINE 30 tablet 0   propranolol (INDERAL) 40 MG tablet TAKE 1 TABLET TWICE A DAY 180 tablet 3   RINVOQ 15 MG TB24 Take 1 tablet by mouth every morning. Pt to hold this for upcoming surgery beginning 09-03-22 per rheumatologist-Last dose on 09-02-22     SUMAtriptan (IMITREX) 100 MG tablet Take 50 to 100 mg as a single dose. If symptoms persist or return, may repeat dose (usually same as first dose) after =2 hours. Maximum dose: 100 mg/dose; 200 mg per 24 hours. 10 tablet 0   timolol (TIMOPTIC) 0.25 % ophthalmic solution Place 1 drop into both eyes daily.     topiramate (TOPAMAX) 25 MG tablet Take 1 tablet (25 mg total) by mouth at bedtime. 90 tablet 0   traMADol (ULTRAM) 50 MG tablet Take 1 tablet (50 mg total) by mouth every 12 (twelve) hours as needed. 60 tablet 2   No current facility-administered medications on file prior to visit.    Allergies  Allergen Reactions   Dried Figs [Ficus] Anaphylaxis    Tongue tingling & felt like throat was swelling.     Flonase [Fluticasone Propionate]     Increases eye pressure    Sulfa Antibiotics Swelling   Tussionex Pennkinetic Er [Hydrocod Poli-Chlorphe Poli Er] Itching    Hyperactive       Review of Systems ROS Review of Systems - General ROS: negative for - chills, fatigue, fever, hot flashes, night sweats, weight gain or weight loss Psychological ROS: negative for - anxiety, decreased libido, depression, mood swings, physical abuse or sexual abuse Ophthalmic ROS: negative for - blurry vision, eye pain or loss of vision ENT ROS: negative for - headaches, hearing change, visual changes or vocal changes Allergy and Immunology ROS: negative for - hives, itchy/watery eyes or seasonal  allergies Hematological and Lymphatic ROS: negative for - bleeding problems,  bruising, swollen lymph nodes or weight loss Endocrine ROS: negative for - galactorrhea, hair pattern changes, hot flashes, malaise/lethargy, mood swings, palpitations, polydipsia/polyuria, skin changes, temperature intolerance or unexpected weight changes Breast ROS: negative for - new or changing breast lumps or nipple discharge Respiratory ROS: negative for - cough or shortness of breath Cardiovascular ROS: negative for - chest pain, irregular heartbeat, palpitations or shortness of breath Gastrointestinal ROS: no abdominal pain, change in bowel habits, or black or bloody stools Genito-Urinary ROS: no dysuria, trouble voiding, or hematuria Musculoskeletal ROS: negative for - joint pain or joint stiffness Neurological ROS: negative for - bowel and bladder control changes Dermatological ROS: negative for rash and skin lesion changes   Objective:   LMP 05/06/2017 (Exact Date) Comment: BSO CONSTITUTIONAL: Well-developed, well-nourished female in no acute distress.  PSYCHIATRIC: Normal mood and affect. Normal behavior. Normal judgment and thought content. NEUROLGIC: Alert and oriented to person, place, and time. Normal muscle tone coordination. No cranial nerve deficit noted. HENT:  Normocephalic, atraumatic, External right and left ear normal. Oropharynx is clear and moist EYES: Conjunctivae and EOM are normal. Pupils are equal, round, and reactive to light. No scleral icterus.  NECK: Normal range of motion, supple, no masses.  Normal thyroid.  SKIN: Skin is warm and dry. No rash noted. Not diaphoretic. No erythema. No pallor. CARDIOVASCULAR: Normal heart rate noted, regular rhythm, no murmur. RESPIRATORY: Clear to auscultation bilaterally. Effort and breath sounds normal, no problems with respiration noted. BREASTS: Symmetric in size. No masses, skin changes, nipple drainage, or lymphadenopathy. ABDOMEN: Soft,  normal bowel sounds, no distention noted.  No tenderness, rebound or guarding.  BLADDER: Normal PELVIC:  Bladder {:311640}  Urethra: {:311719}  Vulva: {:311722}  Vagina: {:311643}  Cervix: {:311644}  Uterus: {:311718}  Adnexa: {:311645}  RV: {Blank multiple:19196::"External Exam NormaI","No Rectal Masses","Normal Sphincter tone"}  MUSCULOSKELETAL: Normal range of motion. No tenderness.  No cyanosis, clubbing, or edema.  2+ distal pulses. LYMPHATIC: No Axillary, Supraclavicular, or Inguinal Adenopathy.   Labs: Lab Results  Component Value Date   WBC 3.8 (L) 09/06/2022   HGB 13.2 09/06/2022   HCT 38.7 09/06/2022   MCV 88.6 09/06/2022   PLT 214 09/06/2022    Lab Results  Component Value Date   CREATININE 0.76 09/06/2022   BUN 18 09/06/2022   NA 136 09/06/2022   K 3.9 09/06/2022   CL 106 09/06/2022   CO2 24 09/06/2022    Lab Results  Component Value Date   ALT 21 05/14/2021   AST 14 05/14/2021   GGT 32 05/03/2018   ALKPHOS 109 04/18/2020   BILITOT 0.5 04/18/2020    Lab Results  Component Value Date   CHOL 244 (H) 01/14/2021   HDL 44 01/14/2021   LDLCALC 166 (H) 01/14/2021   TRIG 184 (H) 01/14/2021   CHOLHDL 5.5 (H) 01/14/2021    Lab Results  Component Value Date   TSH 1.14 03/18/2023    Lab Results  Component Value Date   HGBA1C 5.6 03/18/2023     Assessment:   No diagnosis found.   Plan:  Pap: Pap, Reflex if ASCUS Mammogram: Ordered Colon Screening:   UTD Labs: {Blank multiple:19196::"Lipid 1","FBS","TSH","Hemoglobin A1C","Vit D Level""***"} Routine preventative health maintenance measures emphasized: {Blank multiple:19196::"Exercise/Diet/Weight control","Tobacco Warnings","Alcohol/Substance use risks","Stress Management","Peer Pressure Issues","Safe Sex"} COVID Vaccination status: Return to Clinic - 1 Year   Hildred Laser, MD [facility]

## 2023-08-08 NOTE — Assessment & Plan Note (Signed)
Suboptimal control.  Stop Allegra.  Trial of Xyzal.  Advised to stop Afrin.

## 2023-08-08 NOTE — Patient Instructions (Incomplete)

## 2023-08-08 NOTE — Assessment & Plan Note (Addendum)
Improved overall in setting of Cipap. HAs appear aggravated by cervical neck pain. Concern for HA overuse with daily use of mobic 7.5mg  every day.   Due to headache frequency, patient and I jointly agreed trial of Topamax 25 mg with titration as a most reasonable place to start.  Patient will wean off gradually nortriptyline due to concern for alopecia  side effect as well as medication not being effective.   We also propranolol and if still needed or if effective.  We also discussed polypharmacy particular is related to sedating agents including propranolol, gabapentin, baclofen, tramadol .   Discussed optimizing pain medications including wean of tramadol, gabapentin and trial of Lyrica.  we also discussed considering change from Cymbalta to Effexor in the future if HA persists Refill of imitrex 50-100mg  every day prn.  Close follow-up

## 2023-08-08 NOTE — Progress Notes (Signed)
Assessment & Plan:  OSA (obstructive sleep apnea)  Other migraine without status migrainosus, not intractable Assessment & Plan: Improved overall in setting of Cipap. HAs appear aggravated by cervical neck pain. Concern for HA overuse with daily use of mobic 7.5mg  every day.   Due to headache frequency, patient and I jointly agreed trial of Topamax 25 mg with titration as a most reasonable place to start.  Patient will wean off gradually nortriptyline due to concern for alopecia  side effect as well as medication not being effective.   We also propranolol and if still needed or if effective.  We also discussed polypharmacy particular is related to sedating agents including propranolol, gabapentin, baclofen, tramadol .   Discussed optimizing pain medications including wean of tramadol, gabapentin and trial of Lyrica.  we also discussed considering change from Cymbalta to Effexor in the future if HA persists Refill of imitrex 50-100mg  every day prn.  Close follow-up   Chronic pain syndrome -     DULoxetine HCl; Take 1 capsule (60 mg total) by mouth 2 (two) times daily.  Dispense: 180 capsule; Refill: 3 -     traMADol HCl; Take 1 tablet (50 mg total) by mouth every 12 (twelve) hours as needed.  Dispense: 60 tablet; Refill: 2  Muscle spasm -     Baclofen; Take 0.5 tablets (5 mg total) by mouth 2 (two) times daily.  Non-seasonal allergic rhinitis, unspecified trigger Assessment & Plan: Suboptimal control.  Stop Allegra.  Trial of Xyzal.  Advised to stop Afrin.    Other orders -     Omeprazole; Take 1 capsule (20 mg total) by mouth daily.  Dispense: 90 capsule; Refill: 3 -     SUMAtriptan Succinate; Take 50 to 100 mg as a single dose. If symptoms persist or return, may repeat dose (usually same as first dose) after =2 hours. Maximum dose: 100 mg/dose; 200 mg per 24 hours.  Dispense: 10 tablet; Refill: 0 -     Topiramate; Take 1 tablet (25 mg total) by mouth at bedtime.  Dispense: 90 tablet;  Refill: 0 -     Levocetirizine Dihydrochloride; Take 1 tablet (5 mg total) by mouth every evening.  Dispense: 90 tablet; Refill: 3     Return precautions given.   Risks, benefits, and alternatives of the medications and treatment plan prescribed today were discussed, and patient expressed understanding.   Education regarding symptom management and diagnosis given to patient on AVS either electronically or printed.  Return in about 3 months (around 11/07/2023).  Yvonne Plowman, FNP  Subjective:    Patient ID: Yvonne [name], female    DOB: May 29, 1966, 57 y.o.   MRN: [id number]  CC: Yvonne [name] is a 57 y.o. female who presents today for follow up.   HPI: Follow-up headache  HA's have improved. Less frequent. She notices that she runs out of imitrex ( she gets 9 per month).   HA are aggravated by 'arthritis' and neck pain. Triggers joint pain and then HA. HA feel similar to HA in the past.   HA start posterior neck and then travel to either eye.   15 HA a month; previously HA are daily.   No aura, vision loss.  HA is not worse HA of life.   Compliant with tramadol 50 mg twice daily as needed, meloxicam 7.5 mg daily, baclofen 5mg  qam and 5mg  qhs . She is also compliant with nortriptyline 10 mg nightly ; on higher dose, she has noticed thinner  hair.   Compliant  propranolol 40 mg BID She takes Imitrex 50mg  with complete resolution. She will take zofran.   Complains of pressure in her left ear.  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 2. Severe right neural foraminal stenosis at C3-4. 3. Moderate right and severe left neural foraminal stenosis at C5-6. 4. Moderate to severe left neural foraminal stenosis at C6-7.  She limits caffeine  She never started wegovy due to cost.   Yvonne [name]   [address] [facility] cervical spine 02/23/21 with 4 months relief.   Allergies: Dried figs [ficus], Flonase [fluticasone propionate], Sulfa antibiotics, and Tussionex pennkinetic er [hydrocod poli-chlorphe poli er] Current Outpatient Medications on File Prior to Visit  Medication Sig Dispense Refill   acetaminophen (TYLENOL) 650 MG CR tablet Take 650 mg by mouth every 8 (eight) hours as needed for pain.     albuterol (VENTOLIN HFA) 108 (90 Base) MCG/ACT inhaler Inhale 1-2 puffs into the lungs as needed. (Patient taking differently: Inhale 1-2 puffs into the lungs as needed (was given when she had pneumonia in dec 2022).) 18 g 0   amLODipine (NORVASC) 5 MG tablet Take 1 tablet by mouth once daily 90 tablet 0   cholecalciferol (VITAMIN D3) 25 MCG (1000 UNIT) tablet Take 1,000 Units by mouth daily.     cycloSPORINE (RESTASIS) 0.05 % ophthalmic emulsion Place 1 drop into both eyes 2 (two) times daily.     gabapentin (NEURONTIN) 300 MG capsule Take 1 capsule (300 mg total) by mouth 4 (four) times daily. 120 capsule 3   hydroxychloroquine (PLAQUENIL) 200 MG tablet Take 200 mg by mouth 2 (two) times daily.     latanoprost (XALATAN) 0.005 % ophthalmic solution Place 1 drop into both eyes at bedtime.     Melatonin 10 MG CAPS Take 1 capsule by mouth at bedtime.     meloxicam (MOBIC) 7.5 MG tablet TAKE 1 TABLET BY MOUTH ONCE DAILY AS NEEDED FOR PAIN 90 tablet 1   montelukast (SINGULAIR) 10 MG tablet TAKE 1 TABLET BY MOUTH AT BEDTIME 90 tablet 3   ondansetron (ZOFRAN-ODT) 8 MG disintegrating tablet DISSOLVE 1 TABLET ON THE TOP OF THE TONGUE TWICE DAILY AS NEEDED FOR NAUSEA/VOMITING AND  MIGRAINE 30 tablet 0   propranolol (INDERAL) 40 MG tablet TAKE 1 TABLET TWICE A DAY 180 tablet 3   RINVOQ 15 MG TB24 Take 1 tablet by mouth every morning. Pt to hold this for upcoming surgery beginning 09-03-22 per rheumatologist-Last dose on 09-02-22     timolol (TIMOPTIC) 0.25 % ophthalmic solution Place 1 drop into both eyes daily.     No  current facility-administered medications on file prior to visit.    Review of Systems  Constitutional:  Negative for chills and fever.  HENT:  Positive for ear pain. Negative for congestion, sinus pain and sore throat.   Eyes:  Negative for visual disturbance.  Respiratory:  Negative for cough.   Cardiovascular:  Negative for chest pain and palpitations.  Gastrointestinal:  Negative for nausea and vomiting.  Musculoskeletal:  Positive for neck pain.  Neurological:  Positive for numbness and headaches.      Objective:    BP 130/76   Pulse 78   Temp 98.2 F (36.8 C) (Oral)   Ht 5\' 2"  (1.575 m)   Wt 271 lb  12.8 oz (123.3 kg)   LMP 05/06/2017 (Exact Date) Comment: BSO  SpO2 98%   BMI 49.71 kg/m  BP Readings from Last 3 Encounters:  08/08/23 130/76  03/25/23 124/84  03/18/23 126/80   Wt Readings from Last 3 Encounters:  08/08/23 271 lb 12.8 oz (123.3 kg)  03/25/23 267 lb (121.1 kg)  03/18/23 261 lb (118.4 kg)    Physical Exam Vitals reviewed.  Constitutional:      Appearance: She is well-developed.  Eyes:     Conjunctiva/sclera: Conjunctivae normal.  Cardiovascular:     Rate and Rhythm: Normal rate and regular rhythm.     Pulses: Normal pulses.     Heart sounds: Normal heart sounds.  Pulmonary:     Effort: Pulmonary effort is normal.     Breath sounds: Normal breath sounds. No wheezing, rhonchi or rales.  Skin:    General: Skin is warm and dry.  Neurological:     Mental Status: She is alert.  Psychiatric:        Speech: Speech normal.        Behavior: Behavior normal.        Thought Content: Thought content normal.

## 2023-08-09 ENCOUNTER — Other Ambulatory Visit (HOSPITAL_COMMUNITY)
Admission: RE | Admit: 2023-08-09 | Discharge: 2023-08-09 | Disposition: A | Discharge status: Home / Self Care | Payer: Managed Care, Other (non HMO) | Source: Ambulatory Visit | Attending: Obstetrics and Gynecology | Admitting: Obstetrics and Gynecology

## 2023-08-09 ENCOUNTER — Ambulatory Visit (INDEPENDENT_AMBULATORY_CARE_PROVIDER_SITE_OTHER): Payer: Managed Care, Other (non HMO) | Admitting: Obstetrics and Gynecology

## 2023-08-09 ENCOUNTER — Encounter: Payer: Self-pay | Admitting: Obstetrics and Gynecology

## 2023-08-09 VITALS — BP 112/67 | Resp 16 | Ht 62.5 in | Wt 272.4 lb

## 2023-08-09 DIAGNOSIS — Z01419 Encounter for gynecological examination (general) (routine) without abnormal findings: Secondary | ICD-10-CM

## 2023-08-09 DIAGNOSIS — Z124 Encounter for screening for malignant neoplasm of cervix: Secondary | ICD-10-CM | POA: Diagnosis present

## 2023-08-09 DIAGNOSIS — L853 Xerosis cutis: Secondary | ICD-10-CM

## 2023-08-09 DIAGNOSIS — Z13220 Encounter for screening for lipoid disorders: Secondary | ICD-10-CM

## 2023-08-09 DIAGNOSIS — G4733 Obstructive sleep apnea (adult) (pediatric): Secondary | ICD-10-CM

## 2023-08-10 ENCOUNTER — Encounter: Payer: Managed Care, Other (non HMO) | Admitting: Family

## 2023-08-12 LAB — CYTOLOGY - PAP
Comment: NEGATIVE
Diagnosis: NEGATIVE
High risk HPV: NEGATIVE

## 2023-08-15 ENCOUNTER — Encounter: Payer: Managed Care, Other (non HMO) | Admitting: Family

## 2023-08-16 ENCOUNTER — Other Ambulatory Visit: Payer: Self-pay | Admitting: Family

## 2023-08-16 DIAGNOSIS — G43809 Other migraine, not intractable, without status migrainosus: Secondary | ICD-10-CM

## 2023-08-26 ENCOUNTER — Encounter: Payer: Self-pay | Admitting: Family

## 2023-08-26 DIAGNOSIS — G894 Chronic pain syndrome: Secondary | ICD-10-CM

## 2023-08-29 ENCOUNTER — Other Ambulatory Visit: Payer: Self-pay | Admitting: Family

## 2023-08-29 MED ORDER — TOPIRAMATE 25 MG PO TABS
50.0000 mg | ORAL_TABLET | Freq: Every day | ORAL | Status: DC
Start: 2023-08-29 — End: 2023-08-30

## 2023-08-30 ENCOUNTER — Other Ambulatory Visit: Payer: Self-pay | Admitting: Family

## 2023-08-30 DIAGNOSIS — G43809 Other migraine, not intractable, without status migrainosus: Secondary | ICD-10-CM

## 2023-08-30 MED ORDER — TOPIRAMATE 25 MG PO TABS
50.0000 mg | ORAL_TABLET | Freq: Every day | ORAL | 3 refills | Status: DC
Start: 2023-08-30 — End: 2023-12-05

## 2023-09-01 ENCOUNTER — Ambulatory Visit
Admission: RE | Admit: 2023-09-01 | Discharge: 2023-09-01 | Disposition: A | Discharge status: Home / Self Care | Payer: Managed Care, Other (non HMO) | Source: Ambulatory Visit | Attending: Family | Admitting: Family

## 2023-09-01 DIAGNOSIS — Z1231 Encounter for screening mammogram for malignant neoplasm of breast: Secondary | ICD-10-CM | POA: Insufficient documentation

## 2023-09-02 MED ORDER — DULOXETINE HCL 60 MG PO CPEP
60.0000 mg | ORAL_CAPSULE | Freq: Two times a day (BID) | ORAL | 3 refills | Status: DC
Start: 2023-09-02 — End: 2024-09-25

## 2023-09-08 ENCOUNTER — Other Ambulatory Visit: Payer: Self-pay | Admitting: Family

## 2023-09-08 DIAGNOSIS — I10 Essential (primary) hypertension: Secondary | ICD-10-CM

## 2023-09-16 ENCOUNTER — Other Ambulatory Visit: Payer: Self-pay | Admitting: Family

## 2023-09-22 ENCOUNTER — Other Ambulatory Visit: Payer: Self-pay | Admitting: Family

## 2023-09-22 DIAGNOSIS — M62838 Other muscle spasm: Secondary | ICD-10-CM

## 2023-09-24 ENCOUNTER — Other Ambulatory Visit: Payer: Self-pay | Admitting: Family

## 2023-09-24 DIAGNOSIS — G43809 Other migraine, not intractable, without status migrainosus: Secondary | ICD-10-CM

## 2023-09-27 ENCOUNTER — Encounter: Payer: Self-pay | Admitting: Family

## 2023-09-28 NOTE — Telephone Encounter (Signed)
Spoke to pharmacy and they stated that rx is correct and they can have it filled today and will contact pt when ready for pickup, Pt has been notified

## 2023-09-29 ENCOUNTER — Encounter: Payer: Self-pay | Admitting: Family

## 2023-09-29 ENCOUNTER — Other Ambulatory Visit: Payer: Self-pay

## 2023-09-29 MED ORDER — SUMATRIPTAN SUCCINATE 100 MG PO TABS: ORAL_TABLET | ORAL | 1 refills | Status: DC

## 2023-09-29 NOTE — Telephone Encounter (Signed)
Spoke to pharmacist and they stated that there is a shortage of the 100 mg Sumatriptan and they could only fill the 50 mg and give pt what they have until they can get more in. Pt has been notfied

## 2023-10-14 ENCOUNTER — Encounter: Payer: Self-pay | Admitting: Family

## 2023-11-03 ENCOUNTER — Other Ambulatory Visit: Payer: Self-pay | Admitting: Family

## 2023-11-03 DIAGNOSIS — M2550 Pain in unspecified joint: Secondary | ICD-10-CM

## 2023-11-10 ENCOUNTER — Ambulatory Visit: Payer: Managed Care, Other (non HMO) | Admitting: Family

## 2023-11-10 VITALS — BP 118/70 | HR 65 | Temp 98.4°F | Ht 61.0 in | Wt 273.0 lb

## 2023-11-10 DIAGNOSIS — G894 Chronic pain syndrome: Secondary | ICD-10-CM | POA: Diagnosis not present

## 2023-11-10 DIAGNOSIS — F32A Depression, unspecified: Secondary | ICD-10-CM | POA: Insufficient documentation

## 2023-11-10 DIAGNOSIS — F419 Anxiety disorder, unspecified: Secondary | ICD-10-CM | POA: Diagnosis not present

## 2023-11-10 DIAGNOSIS — G43809 Other migraine, not intractable, without status migrainosus: Secondary | ICD-10-CM

## 2023-11-10 MED ORDER — TRAMADOL HCL 50 MG PO TABS: 50.0000 mg | ORAL_TABLET | Freq: Two times a day (BID) | ORAL | 2 refills | Status: DC | PRN

## 2023-11-10 NOTE — Progress Notes (Signed)
Assessment & Plan:  Other migraine without status migrainosus, not intractable Assessment & Plan: Chronic, improved.  Increase Topamax from 50 mg daily to 75 mg daily.  Counseled on increased anxiety potentially as a side effect of Topamax.  She will stay vigilant for this.  She is not interested in changing from Cymbalta to Effexor at this time.     Chronic pain syndrome -     traMADol HCl; Take 1 tablet (50 mg total) by mouth every 12 (twelve) hours as needed.  Dispense: 60 tablet; Refill: 2  Anxiety Assessment & Plan: Recent increase in anxiety of late.  Discussed behavioral techniques, meditation, deep breathing.  Patient politely declines counseling referral or additional prn medication at this time.  She will monitor, particularly with increase of Topamax.  Continue Cymbalta 60 mg twice daily, propranolol 40 mg twice daily.      Return precautions given.   Risks, benefits, and alternatives of the medications and treatment plan prescribed today were discussed, and patient expressed understanding.   Education regarding symptom management and diagnosis given to patient on AVS either electronically or printed.  Return in about 3 months (around 02/08/2024).  Rennie Plowman, FNP  Subjective:    Patient ID: Quentin Cornwall, female    DOB: [birth year], 57 y.o.   MRN: [id number]  CC: Yvonne [name] is a 57 y.o. female who presents today for follow up.   HPI: Overall feels well today.  She is feeling better since being off nortriptyline.  Headache frequency improved however she is interested in increasing Topamax 50 mg.   Complains of increased anxiety.  It is more noticeable.  She is able to deep breathe and symptom will resolved.  No particular pattern.  She has had anxiety since she was a child  She request a refill of tramadol  Allergies: Dried figs [ficus], Flonase [fluticasone propionate], Sulfa antibiotics, and Tussionex pennkinetic er [hydrocod poli-chlorphe poli  er] Current Outpatient Medications on File Prior to Visit  Medication Sig Dispense Refill   acetaminophen (TYLENOL) 650 MG CR tablet Take 650 mg by mouth every 8 (eight) hours as needed for pain.     albuterol (VENTOLIN HFA) 108 (90 Base) MCG/ACT inhaler Inhale 1-2 puffs into the lungs as needed. (Patient taking differently: Inhale 1-2 puffs into the lungs as needed (was given when she had pneumonia in dec 2022).) 18 g 0   amLODipine (NORVASC) 5 MG tablet Take 1 tablet by mouth once daily 90 tablet 0   baclofen (LIORESAL) 10 MG tablet TAKE 1 TABLET BY MOUTH TWICE DAILY AS NEEDED FOR MUSCLE SPASM 60 tablet 2   cycloSPORINE (RESTASIS) 0.05 % ophthalmic emulsion Place 1 drop into both eyes 2 (two) times daily.     DULoxetine (CYMBALTA) 60 MG capsule Take 1 capsule (60 mg total) by mouth 2 (two) times daily. 180 capsule 3   gabapentin (NEURONTIN) 300 MG capsule Take 1 capsule (300 mg total) by mouth 4 (four) times daily. 120 capsule 3   hydroxychloroquine (PLAQUENIL) 200 MG tablet Take 200 mg by mouth 2 (two) times daily.     latanoprost (XALATAN) 0.005 % ophthalmic solution Place 1 drop into both eyes at bedtime.     levocetirizine (XYZAL) 5 MG tablet Take 1 tablet (5 mg total) by mouth every evening. 90 tablet 3   Melatonin 10 MG CAPS Take 1 capsule by mouth at bedtime.     meloxicam (MOBIC) 7.5 MG tablet TAKE 1 TABLET BY MOUTH ONCE DAILY AS NEEDED  FOR PAIN 90 tablet 0   montelukast (SINGULAIR) 10 MG tablet TAKE 1 TABLET BY MOUTH AT BEDTIME 90 tablet 3   omeprazole (PRILOSEC) 20 MG capsule Take 1 capsule (20 mg total) by mouth daily. 90 capsule 3   ondansetron (ZOFRAN-ODT) 8 MG disintegrating tablet DISSOLVE 1 TABLET IN MOUTH TWICE DAILY AS NEEDED FOR MIGRAINE HEADACHE FOR NAUSEA AND VOMITING 30 tablet 0   propranolol (INDERAL) 40 MG tablet Take 1 tablet by mouth twice daily 180 tablet 0   RINVOQ 15 MG TB24 Take 1 tablet by mouth every morning. Pt to hold this for upcoming surgery beginning 09-03-22  per rheumatologist-Last dose on 09-02-22     SUMAtriptan (IMITREX) 100 MG tablet Take 50 to 100 mg as a single dose. If symptoms persist or return, may repeat dose (usually same as first dose) after =2 hours. Maximum dose: 100 mg/dose; 200 mg per 24 hours. 90 tablet 1   timolol (TIMOPTIC) 0.25 % ophthalmic solution Place 1 drop into both eyes daily.     topiramate (TOPAMAX) 25 MG tablet Take 2 tablets (50 mg total) by mouth at bedtime. 180 tablet 3   No current facility-administered medications on file prior to visit.    Review of Systems  Constitutional:  Negative for chills and fever.  Respiratory:  Negative for cough.   Cardiovascular:  Negative for chest pain and palpitations.  Gastrointestinal:  Negative for nausea and vomiting.  Psychiatric/Behavioral:  Negative for sleep disturbance and suicidal ideas. The patient is nervous/anxious.       Objective:    BP 118/70   Pulse 65   Temp 98.4 F (36.9 C) (Oral)   Ht 5\' 1"  (1.549 m)   Wt 273 lb (123.8 kg)   LMP 05/06/2017 (Exact Date) Comment: BSO  SpO2 98%   BMI 51.58 kg/m  BP Readings from Last 3 Encounters:  11/10/23 118/70  08/09/23 112/67  08/08/23 130/76   Wt Readings from Last 3 Encounters:  11/10/23 273 lb (123.8 kg)  08/09/23 272 lb 6.4 oz (123.6 kg)  08/08/23 271 lb 12.8 oz (123.3 kg)      08/08/2023   11:38 AM 03/18/2023    3:56 PM 12/17/2022    3:18 PM  Depression screen PHQ 2/9  Decreased Interest 2 1 0  Down, Depressed, Hopeless 1 1 0  PHQ - 2 Score 3 2 0  Altered sleeping 0 1 1  Tired, decreased energy 3 1 1   Change in appetite 2 2 0  Feeling bad or failure about yourself  1 2 0  Trouble concentrating 0 2 0  Moving slowly or fidgety/restless 0 2 0  Suicidal thoughts 0 0 0  PHQ-9 Score 9 12 2   Difficult doing work/chores Somewhat difficult Very difficult      Physical Exam Vitals reviewed.  Constitutional:      Appearance: She is well-developed.  Eyes:     Conjunctiva/sclera: Conjunctivae  normal.  Cardiovascular:     Rate and Rhythm: Normal rate and regular rhythm.     Pulses: Normal pulses.     Heart sounds: Normal heart sounds.  Pulmonary:     Effort: Pulmonary effort is normal.     Breath sounds: Normal breath sounds. No wheezing, rhonchi or rales.  Skin:    General: Skin is warm and dry.  Neurological:     Mental Status: She is alert.  Psychiatric:        Speech: Speech normal.        Behavior: Behavior  normal.        Thought Content: Thought content normal.

## 2023-11-10 NOTE — Assessment & Plan Note (Signed)
Chronic, improved.  Increase Topamax from 50 mg daily to 75 mg daily.  Counseled on increased anxiety potentially as a side effect of Topamax.  She will stay vigilant for this.  She is not interested in changing from Cymbalta to Effexor at this time.

## 2023-11-10 NOTE — Patient Instructions (Signed)
Increase topmax from 50mg  to 75mg  in the evening Monitor for increased anxiety

## 2023-11-10 NOTE — Assessment & Plan Note (Signed)
Recent increase in anxiety of late.  Discussed behavioral techniques, meditation, deep breathing.  Patient politely declines counseling referral or additional prn medication at this time.  She will monitor, particularly with increase of Topamax.  Continue Cymbalta 60 mg twice daily, propranolol 40 mg twice daily.

## 2023-11-25 ENCOUNTER — Encounter: Payer: Self-pay | Admitting: Family

## 2023-11-25 NOTE — Telephone Encounter (Signed)
Pt has been scheduled with a provider at [facility].

## 2023-11-28 ENCOUNTER — Telehealth (INDEPENDENT_AMBULATORY_CARE_PROVIDER_SITE_OTHER): Payer: Managed Care, Other (non HMO) | Admitting: Family Medicine

## 2023-11-28 ENCOUNTER — Encounter: Payer: Self-pay | Admitting: Family Medicine

## 2023-11-28 DIAGNOSIS — J019 Acute sinusitis, unspecified: Secondary | ICD-10-CM

## 2023-11-28 NOTE — Progress Notes (Signed)
Patient ID: Yvonne [name], female   DOB: Apr 21, 1966, 57 y.o.   MRN: [id number]   Virtual Visit via Video Note  I connected with Daryel Gerald on 11/28/23 at  3:00 PM EST by a video enabled telemedicine application and verified that I am speaking with the correct person using two identifiers.  Location patient: home Location provider:work or home office Persons participating in the virtual visit: patient, provider  I discussed the limitations of evaluation and management by telemedicine and the availability of in person appointments. The patient expressed understanding and agreed to proceed.   HPI:  Tanvee has past medical history significant for hypertension, history of migraine headaches, obstructive sleep apnea, GERD, hepatic steatosis, IBS, lumbar spondylosis, rheumatoid arthritis on Rinvoq.   She developed some cheek pain and nasal congestive symptoms and was seen at [facility] apparently on Friday and started on Augmentin 875 mg twice daily for 7 days.  She has had little bit of bloody nasal discharge.  Denies any nausea, vomiting, or diarrhea.  No dyspnea. She feels perhaps just slightly better since starting the Augmentin.  Her main concerns today involve the following:  -Duration of Augmentin.  She wonders if she needs more than 7 days -She is inquiring about possible work note for today and tomorrow.  She will have Wednesday off.   ROS: See pertinent positives and negatives per HPI.  Past Medical History:  Diagnosis Date   Allergy    Arthritis, rheumatoid (HCC)    Degenerative disc disease, cervical    GERD (gastroesophageal reflux disease)    Gestational diabetes    patient denies   Glaucoma    H/O bronchitis    Headache    migraine   History of ovarian cyst    Hypertension    pre-eclampsia with first child   Increased pressure in the eye, bilateral    Pneumonia 10/2021   PONV (postoperative nausea and vomiting)    Restless leg     Shingles 2021   Sleep apnea in adult    Thyroid nodule     Past Surgical History:  Procedure Laterality Date   CERVICAL POLYPECTOMY N/A 04/11/2017   Procedure: CERVICAL POLYPECTOMY;  Surgeon: Hildred Laser, MD;  Location: [facility] OR[facility];  Service: Gynecology;  Laterality: N/A;   COLONOSCOPY WITH PROPOFOL N/A 03/24/2021   Procedure: COLONOSCOPY WITH BIOPSY;  Surgeon: Pasty Spillers, MD;  Location: [facility];  Service: Endoscopy;  Laterality: N/A;   COLONOSCOPY WITH PROPOFOL N/A 09/18/2021   Procedure: COLONOSCOPY WITH PROPOFOL;  Surgeon: Pasty Spillers, MD;  Location: [facility];  Service: Endoscopy;  Laterality: N/A;   ESOPHAGOGASTRODUODENOSCOPY N/A 09/18/2021   Procedure: ESOPHAGOGASTRODUODENOSCOPY (EGD);  Surgeon: Pasty Spillers, MD;  Location: [facility];  Service: Endoscopy;  Laterality: N/A;   FOOT ARTHRODESIS Left 09/10/2022   Procedure: ARTHRODESIS FOOT;  Surgeon: Gwyneth Revels, DPM;  Location: [facility];  Service: Podiatry;  Laterality: Left;   HYSTEROSCOPY WITH D & C N/A 04/11/2017   Procedure: DILATATION AND CURETTAGE /HYSTEROSCOPY;  Surgeon: Hildred Laser, MD;  Location: [facility];  Service: Gynecology;  Laterality: N/A;   IUD REMOVAL N/A 05/05/2017   Procedure: INTRAUTERINE DEVICE (IUD) REMOVAL;  Surgeon: Hildred Laser, MD;  Location: [facility];  Service: Gynecology;  Laterality: N/A;   LAPAROSCOPIC SALPINGO OOPHERECTOMY Bilateral 05/05/2017   Procedure: LAPAROSCOPIC BILATERAL SALPINGO OOPHORECTOMY;  Surgeon: Hildred Laser, MD;  Location: [facility];  Service: Gynecology;  Laterality: Bilateral;   LAPAROSCOPY N/A 04/11/2017   Procedure: LAPAROSCOPY  DIAGNOSTIC;  Surgeon: Hildred Laser, MD;  Location: [facility];  Service: Gynecology;  Laterality: N/A;   POLYPECTOMY N/A 03/24/2021   Procedure: POLYPECTOMY;  Surgeon: Pasty Spillers, MD;  Location: [facility];  Service: Endoscopy;  Laterality: N/A;   TUBAL LIGATION      Family History   Problem Relation Age of Onset   Schizophrenia Mother    Bipolar disorder Mother    Alcohol abuse Father    Hypertension Father    Hypertension Brother    Breast cancer Brother    Hypertension Maternal Grandmother    Seizures Paternal Grandmother    Diabetes Paternal Grandmother    Thyroid cancer Neg Hx     SOCIAL HX: Former smoker.   Current Outpatient Medications:    acetaminophen (TYLENOL) 650 MG CR tablet, Take 650 mg by mouth every 8 (eight) hours as needed for pain., Disp: , Rfl:    albuterol (VENTOLIN HFA) 108 (90 Base) MCG/ACT inhaler, Inhale 1-2 puffs into the lungs as needed. (Patient taking differently: Inhale 1-2 puffs into the lungs as needed (was given when she had pneumonia in dec 2022).), Disp: 18 g, Rfl: 0   amLODipine (NORVASC) 5 MG tablet, Take 1 tablet by mouth once daily, Disp: 90 tablet, Rfl: 0   amoxicillin-clavulanate (AUGMENTIN) 500-125 MG tablet, 1 tablet Orally every 12 hrs for 7 day(s), Disp: , Rfl:    baclofen (LIORESAL) 10 MG tablet, TAKE 1 TABLET BY MOUTH TWICE DAILY AS NEEDED FOR MUSCLE SPASM, Disp: 60 tablet, Rfl: 2   benzonatate (TESSALON) 100 MG capsule, 1 capsule as needed Orally Three times a day for 7 days, Disp: , Rfl:    cycloSPORINE (RESTASIS) 0.05 % ophthalmic emulsion, Place 1 drop into both eyes 2 (two) times daily., Disp: , Rfl:    DULoxetine (CYMBALTA) 60 MG capsule, Take 1 capsule (60 mg total) by mouth 2 (two) times daily., Disp: 180 capsule, Rfl: 3   gabapentin (NEURONTIN) 300 MG capsule, Take 1 capsule (300 mg total) by mouth 4 (four) times daily., Disp: 120 capsule, Rfl: 3   hydroxychloroquine (PLAQUENIL) 200 MG tablet, Take 200 mg by mouth 2 (two) times daily., Disp: , Rfl:    latanoprost (XALATAN) 0.005 % ophthalmic solution, Place 1 drop into both eyes at bedtime., Disp: , Rfl:    levocetirizine (XYZAL) 5 MG tablet, Take 1 tablet (5 mg total) by mouth every evening., Disp: 90 tablet, Rfl: 3   Melatonin 10 MG CAPS, Take 1 capsule by  mouth at bedtime., Disp: , Rfl:    meloxicam (MOBIC) 7.5 MG tablet, TAKE 1 TABLET BY MOUTH ONCE DAILY AS NEEDED FOR PAIN, Disp: 90 tablet, Rfl: 0   montelukast (SINGULAIR) 10 MG tablet, TAKE 1 TABLET BY MOUTH AT BEDTIME, Disp: 90 tablet, Rfl: 3   omeprazole (PRILOSEC) 20 MG capsule, Take 1 capsule (20 mg total) by mouth daily., Disp: 90 capsule, Rfl: 3   ondansetron (ZOFRAN-ODT) 8 MG disintegrating tablet, DISSOLVE 1 TABLET IN MOUTH TWICE DAILY AS NEEDED FOR MIGRAINE HEADACHE FOR NAUSEA AND VOMITING, Disp: 30 tablet, Rfl: 0   propranolol (INDERAL) 40 MG tablet, Take 1 tablet by mouth twice daily, Disp: 180 tablet, Rfl: 0   RINVOQ 15 MG TB24, Take 1 tablet by mouth every morning. Pt to hold this for upcoming surgery beginning 09-03-22 per rheumatologist-Last dose on 09-02-22, Disp: , Rfl:    SUMAtriptan (IMITREX) 100 MG tablet, Take 50 to 100 mg as a single dose. If symptoms persist or return, may repeat  dose (usually same as first dose) after =2 hours. Maximum dose: 100 mg/dose; 200 mg per 24 hours., Disp: 90 tablet, Rfl: 1   timolol (TIMOPTIC) 0.25 % ophthalmic solution, Place 1 drop into both eyes daily., Disp: , Rfl:    topiramate (TOPAMAX) 25 MG tablet, Take 2 tablets (50 mg total) by mouth at bedtime., Disp: 180 tablet, Rfl: 3   traMADol (ULTRAM) 50 MG tablet, Take 1 tablet (50 mg total) by mouth every 12 (twelve) hours as needed., Disp: 60 tablet, Rfl: 2  EXAM:  VITALS per patient if applicable:  GENERAL: alert, oriented, appears well and in no acute distress  HEENT: atraumatic, conjunttiva clear, no obvious abnormalities on inspection of external nose and ears  NECK: normal movements of the head and neck  LUNGS: on inspection no signs of respiratory distress, breathing rate appears normal, no obvious gross SOB, gasping or wheezing  CV: no obvious cyanosis  MS: moves all visible extremities without noticeable abnormality  PSYCH/NEURO: pleasant and cooperative, no obvious depression or  anxiety, speech and thought processing grossly intact  ASSESSMENT AND PLAN:  Discussed the following assessment and plan:  Acute sinusitis symptoms.  Patient already on Augmentin day 3 She is requesting work note for today and tomorrow and these were provided.  Encouraged good hydration.  Follow-up immediately for any fever or worsening symptoms.  Regarding duration of Augmentin therapy we recommend she finish out the 7 days and then reassess.     I discussed the assessment and treatment plan with the patient. The patient was provided an opportunity to ask questions and all were answered. The patient agreed with the plan and demonstrated an understanding of the instructions.   The patient was advised to call back or seek an in-person evaluation if the symptoms worsen or if the condition fails to improve as anticipated.     Evelena Peat, MD

## 2023-11-28 NOTE — Telephone Encounter (Signed)
Spoke to pt and confirmed appt for 3pm

## 2023-12-01 ENCOUNTER — Other Ambulatory Visit: Payer: Self-pay | Admitting: Family

## 2023-12-01 DIAGNOSIS — I10 Essential (primary) hypertension: Secondary | ICD-10-CM

## 2023-12-02 ENCOUNTER — Encounter: Payer: Self-pay | Admitting: Family

## 2023-12-02 ENCOUNTER — Other Ambulatory Visit: Payer: Self-pay

## 2023-12-02 ENCOUNTER — Other Ambulatory Visit: Payer: Self-pay | Admitting: Family

## 2023-12-02 DIAGNOSIS — J40 Bronchitis, not specified as acute or chronic: Secondary | ICD-10-CM

## 2023-12-02 MED ORDER — ALBUTEROL SULFATE HFA 108 (90 BASE) MCG/ACT IN AERS: 1.0000 | INHALATION_SPRAY | RESPIRATORY_TRACT | 0 refills | Status: DC | PRN

## 2023-12-05 ENCOUNTER — Other Ambulatory Visit: Payer: Self-pay | Admitting: Family

## 2023-12-05 ENCOUNTER — Encounter: Payer: Self-pay | Admitting: Family

## 2023-12-05 DIAGNOSIS — J3089 Other allergic rhinitis: Secondary | ICD-10-CM

## 2023-12-05 DIAGNOSIS — G43809 Other migraine, not intractable, without status migrainosus: Secondary | ICD-10-CM

## 2023-12-05 MED ORDER — FEXOFENADINE HCL 180 MG PO TABS: 180.0000 mg | ORAL_TABLET | Freq: Every day | ORAL | 1 refills | Status: DC

## 2023-12-05 MED ORDER — TOPIRAMATE 25 MG PO TABS: 75.0000 mg | ORAL_TABLET | Freq: Every day | ORAL | 3 refills | Status: DC

## 2023-12-06 ENCOUNTER — Encounter: Payer: Self-pay | Admitting: Family Medicine

## 2023-12-06 MED ORDER — AZITHROMYCIN 250 MG PO TABS: ORAL_TABLET | ORAL | 0 refills | Status: AC

## 2023-12-10 ENCOUNTER — Other Ambulatory Visit: Payer: Self-pay | Admitting: Family

## 2023-12-10 DIAGNOSIS — G43809 Other migraine, not intractable, without status migrainosus: Secondary | ICD-10-CM

## 2023-12-12 ENCOUNTER — Encounter: Payer: Self-pay | Admitting: Family

## 2023-12-12 DIAGNOSIS — J3089 Other allergic rhinitis: Secondary | ICD-10-CM

## 2023-12-12 NOTE — Telephone Encounter (Signed)
 NOTED

## 2023-12-13 ENCOUNTER — Other Ambulatory Visit: Payer: Self-pay | Admitting: Family

## 2023-12-13 ENCOUNTER — Encounter: Payer: Self-pay | Admitting: Family

## 2023-12-13 DIAGNOSIS — G43809 Other migraine, not intractable, without status migrainosus: Secondary | ICD-10-CM

## 2023-12-18 ENCOUNTER — Encounter: Payer: Self-pay | Admitting: Family

## 2023-12-20 ENCOUNTER — Other Ambulatory Visit: Payer: Self-pay | Admitting: Family

## 2023-12-20 DIAGNOSIS — M62838 Other muscle spasm: Secondary | ICD-10-CM

## 2024-01-04 ENCOUNTER — Ambulatory Visit: Payer: Managed Care, Other (non HMO) | Admitting: Family

## 2024-01-04 ENCOUNTER — Encounter: Payer: Self-pay | Admitting: Family

## 2024-01-04 VITALS — BP 118/76 | HR 78 | Temp 98.0°F | Ht 62.0 in | Wt 268.2 lb

## 2024-01-04 DIAGNOSIS — I10 Essential (primary) hypertension: Secondary | ICD-10-CM | POA: Diagnosis not present

## 2024-01-04 DIAGNOSIS — G43809 Other migraine, not intractable, without status migrainosus: Secondary | ICD-10-CM | POA: Diagnosis not present

## 2024-01-04 LAB — BASIC METABOLIC PANEL
BUN: 18 mg/dL (ref 6–23)
CO2: 24 meq/L (ref 19–32)
Calcium: 9.5 mg/dL (ref 8.4–10.5)
Chloride: 106 meq/L (ref 96–112)
Creatinine, Ser: 0.73 mg/dL (ref 0.40–1.20)
GFR: 92.55 mL/min (ref >60.00)
Glucose, Bld: 102 mg/dL — ABNORMAL HIGH (ref 70–99)
Potassium: 4.3 meq/L (ref 3.5–5.1)
Sodium: 139 meq/L (ref 135–145)

## 2024-01-04 MED ORDER — TOPIRAMATE 100 MG PO TABS: 100.0000 mg | ORAL_TABLET | Freq: Every day | ORAL | 3 refills | Status: DC

## 2024-01-04 MED ORDER — ONDANSETRON 8 MG PO TBDP: ORAL_TABLET | ORAL | 2 refills | Status: DC

## 2024-01-04 NOTE — Progress Notes (Signed)
 Assessment & Plan:  Other migraine without status migrainosus, not intractable Assessment & Plan: Severity of HA has improved.  Increase Topamax  100 mg daily for additional benefit of weight loss.  Orders: -     Topiramate ; Take 1 tablet (100 mg total) by mouth daily.  Dispense: 90 tablet; Refill: 3 -     Ondansetron ; DISSOLVE 1 TABLET IN MOUTH TWICE DAILY AS NEEDED FOR MIGRAINE HEADACHE FOR NAUSEA AND VOMITING  Dispense: 30 tablet; Refill: 2 -     Basic metabolic panel  Hypertension, unspecified type Assessment & Plan: Chronic, stable. Continue amlodipine  5mg  qd  Of note, on exam no evidence of fluid volume overload.  She has lost 5 pounds.  No symptoms to suggest alternative diagnoses including CHF.  Advised use of compression stockings.  Will continue to monitor symptom.      Return precautions given.   Risks, benefits, and alternatives of the medications and treatment plan prescribed today were discussed, and patient expressed understanding.   Education regarding symptom management and diagnosis given to patient on AVS either electronically or printed.  Return in about 3 months (around 04/02/2024).  [name], FNP  Subjective:    Patient ID: [name], female    DOB: 13-Apr-1966, 58 y.o.   MRN: [id number]  CC: LUA FENG is a 58 y.o. female who presents today for an acute visit.    HPI: Feels well today.  She describes feeling more swollen in her hands, feet.  She is concerned for fluid retention.  No shortness of breath, orthopnea   Compliantly Topamax  75 mg daily.  HA frequency unchanged however she feels more 'controlled' ; she is able to take zofran  with mobic  for relief of HA; she is using less imitrex .   She has had appetite suppression and she interested in increasing topamax .   Follow-up 12/22/2023; continue on Plaquenil.  Holding on other Biologics.  No longer on Humira, Rinvoq Allergies: Dried figs [ficus], Flonase  [fluticasone  propionate],  Sulfa antibiotics, and Tussionex pennkinetic er [hydrocod poli-chlorphe poli er] Current Outpatient Medications on File Prior to Visit  Medication Sig Dispense Refill   acetaminophen  (TYLENOL ) 650 MG CR tablet Take 650 mg by mouth every 8 (eight) hours as needed for pain.     albuterol  (VENTOLIN  HFA) 108 (90 Base) MCG/ACT inhaler Inhale 1-2 puffs into the lungs as needed. 18 g 0   amLODipine  (NORVASC ) 5 MG tablet Take 1 tablet by mouth once daily 90 tablet 0   baclofen  (LIORESAL ) 10 MG tablet TAKE 1 TABLET BY MOUTH TWICE DAILY AS NEEDED FOR MUSCLE SPASM 60 tablet 0   cycloSPORINE (RESTASIS) 0.05 % ophthalmic emulsion Place 1 drop into both eyes 2 (two) times daily.     DULoxetine  (CYMBALTA ) 60 MG capsule Take 1 capsule (60 mg total) by mouth 2 (two) times daily. 180 capsule 3   fexofenadine  (ALLEGRA  ALLERGY) 180 MG tablet Take 1 tablet (180 mg total) by mouth daily. 90 tablet 1   gabapentin  (NEURONTIN ) 300 MG capsule Take 1 capsule (300 mg total) by mouth 4 (four) times daily. 120 capsule 3   hydroxychloroquine (PLAQUENIL) 200 MG tablet Take 200 mg by mouth 2 (two) times daily.     latanoprost (XALATAN) 0.005 % ophthalmic solution Place 1 drop into both eyes at bedtime.     Melatonin 10 MG CAPS Take 1 capsule by mouth at bedtime.     meloxicam  (MOBIC ) 7.5 MG tablet TAKE 1 TABLET BY MOUTH ONCE DAILY AS NEEDED FOR PAIN  90 tablet 0   montelukast  (SINGULAIR ) 10 MG tablet TAKE 1 TABLET BY MOUTH AT BEDTIME 90 tablet 3   omeprazole  (PRILOSEC) 20 MG capsule Take 1 capsule (20 mg total) by mouth daily. 90 capsule 3   propranolol  (INDERAL ) 40 MG tablet Take 1 tablet by mouth twice daily 180 tablet 0   SUMAtriptan  (IMITREX ) 100 MG tablet Take 50 to 100 mg as a single dose. If symptoms persist or return, may repeat dose (usually same as first dose) after =2 hours. Maximum dose: 100 mg/dose; 200 mg per 24 hours. 90 tablet 1   timolol (TIMOPTIC) 0.25 % ophthalmic solution Place 1 drop into both eyes daily.      traMADol  (ULTRAM ) 50 MG tablet Take 1 tablet (50 mg total) by mouth every 12 (twelve) hours as needed. 60 tablet 2   No current facility-administered medications on file prior to visit.    Review of Systems  Constitutional:  Negative for chills and fever.  Eyes:  Negative for visual disturbance.  Respiratory:  Negative for cough.   Cardiovascular:  Negative for chest pain, palpitations and leg swelling.  Gastrointestinal:  Negative for nausea and vomiting.  Neurological:  Positive for headaches.      Objective:    BP 118/76   Pulse 78   Temp 98 F (36.7 C) (Oral)   Ht 5' 2 (1.575 m)   Wt 268 lb 3.2 oz (121.7 kg)   LMP 05/06/2017 (Exact Date) Comment: BSO  SpO2 97%   BMI 49.05 kg/m   BP Readings from Last 3 Encounters:  01/04/24 118/76  11/10/23 118/70  08/09/23 112/67   Wt Readings from Last 3 Encounters:  01/04/24 268 lb 3.2 oz (121.7 kg)  11/10/23 273 lb (123.8 kg)  08/09/23 272 lb 6.4 oz (123.6 kg)    Physical Exam Vitals reviewed.  Constitutional:      Appearance: She is well-developed.  Eyes:     Conjunctiva/sclera: Conjunctivae normal.  Cardiovascular:     Rate and Rhythm: Normal rate and regular rhythm.     Pulses: Normal pulses.     Heart sounds: Normal heart sounds.  Pulmonary:     Effort: Pulmonary effort is normal.     Breath sounds: Normal breath sounds. No wheezing, rhonchi or rales.  Musculoskeletal:     Right hand: No swelling.     Left hand: No swelling.     Right lower leg: No edema.     Left lower leg: No edema.  Skin:    General: Skin is warm and dry.  Neurological:     Mental Status: She is alert.  Psychiatric:        Speech: Speech normal.        Behavior: Behavior normal.        Thought Content: Thought content normal.

## 2024-01-04 NOTE — Patient Instructions (Addendum)
 Trial zip up compression stockings, 15-51mmHg  Low sodium  Increase topamax  to 100mg  daily.    Nice to see you!

## 2024-01-05 ENCOUNTER — Encounter: Payer: Self-pay | Admitting: Family

## 2024-01-05 NOTE — Assessment & Plan Note (Addendum)
 Chronic, stable. Continue amlodipine  5mg  qd  Of note, on exam no evidence of fluid volume overload.  She has lost 5 pounds.  No symptoms to suggest alternative diagnoses including CHF.  Advised use of compression stockings.  Will continue to monitor symptom.

## 2024-01-05 NOTE — Assessment & Plan Note (Addendum)
 Severity of HA has improved.  Increase Topamax  100 mg daily for additional benefit of weight loss.

## 2024-01-24 ENCOUNTER — Other Ambulatory Visit: Payer: Self-pay | Admitting: Family

## 2024-01-24 DIAGNOSIS — I10 Essential (primary) hypertension: Secondary | ICD-10-CM

## 2024-01-24 DIAGNOSIS — M2550 Pain in unspecified joint: Secondary | ICD-10-CM

## 2024-01-30 ENCOUNTER — Other Ambulatory Visit: Payer: Self-pay | Admitting: Family

## 2024-01-30 DIAGNOSIS — G894 Chronic pain syndrome: Secondary | ICD-10-CM

## 2024-01-31 ENCOUNTER — Encounter: Payer: Self-pay | Admitting: Family

## 2024-01-31 DIAGNOSIS — G894 Chronic pain syndrome: Secondary | ICD-10-CM

## 2024-01-31 NOTE — Telephone Encounter (Signed)
 Lov: 02/05/2 Nov: 04/02/24  E-Prescribing Status: Receipt confirmed by pharmacy (11/10/2023  4:58 PM EST)

## 2024-02-01 ENCOUNTER — Other Ambulatory Visit: Payer: Self-pay | Admitting: Family

## 2024-02-01 MED ORDER — TRAMADOL HCL 50 MG PO TABS
50.0000 mg | ORAL_TABLET | Freq: Two times a day (BID) | ORAL | 2 refills | Status: DC | PRN
Start: 2024-02-01 — End: 2024-02-02

## 2024-02-02 ENCOUNTER — Other Ambulatory Visit: Payer: Self-pay | Admitting: Family

## 2024-02-02 DIAGNOSIS — G894 Chronic pain syndrome: Secondary | ICD-10-CM

## 2024-02-02 MED ORDER — TRAMADOL HCL 50 MG PO TABS
50.0000 mg | ORAL_TABLET | Freq: Two times a day (BID) | ORAL | 2 refills | Status: DC | PRN
Start: 2024-02-02 — End: 2024-03-22

## 2024-02-06 ENCOUNTER — Encounter: Payer: Self-pay | Admitting: Family

## 2024-02-06 NOTE — Telephone Encounter (Signed)
 Spoke to pt and informed him that rx has been sent in to pharmacy in [facility]

## 2024-02-08 ENCOUNTER — Telehealth: Payer: Managed Care, Other (non HMO) | Admitting: Family

## 2024-02-16 ENCOUNTER — Other Ambulatory Visit: Payer: Self-pay | Admitting: Family

## 2024-02-16 DIAGNOSIS — G2581 Restless legs syndrome: Secondary | ICD-10-CM

## 2024-02-18 ENCOUNTER — Other Ambulatory Visit: Payer: Self-pay | Admitting: Family

## 2024-02-18 DIAGNOSIS — M62838 Other muscle spasm: Secondary | ICD-10-CM

## 2024-03-09 ENCOUNTER — Encounter: Payer: Self-pay | Admitting: Family

## 2024-03-09 ENCOUNTER — Other Ambulatory Visit: Payer: Self-pay

## 2024-03-09 DIAGNOSIS — J3089 Other allergic rhinitis: Secondary | ICD-10-CM

## 2024-03-09 MED ORDER — FEXOFENADINE HCL 180 MG PO TABS: 180.0000 mg | ORAL_TABLET | Freq: Every day | ORAL | 1 refills | Status: DC

## 2024-03-09 NOTE — Telephone Encounter (Signed)
 Spoke to pt sent med refill to [facility] [address]

## 2024-03-18 ENCOUNTER — Other Ambulatory Visit: Payer: Self-pay | Admitting: Family

## 2024-03-18 DIAGNOSIS — G2581 Restless legs syndrome: Secondary | ICD-10-CM

## 2024-03-19 ENCOUNTER — Other Ambulatory Visit: Payer: Self-pay | Admitting: Family

## 2024-03-20 ENCOUNTER — Encounter: Payer: Self-pay | Admitting: Family

## 2024-03-20 ENCOUNTER — Other Ambulatory Visit: Payer: Self-pay | Admitting: Family

## 2024-03-21 ENCOUNTER — Encounter: Payer: Self-pay | Admitting: Family

## 2024-03-22 ENCOUNTER — Other Ambulatory Visit: Payer: Self-pay | Admitting: Family

## 2024-03-22 ENCOUNTER — Telehealth: Payer: Self-pay

## 2024-03-22 ENCOUNTER — Other Ambulatory Visit (HOSPITAL_COMMUNITY): Payer: Self-pay

## 2024-03-22 DIAGNOSIS — G894 Chronic pain syndrome: Secondary | ICD-10-CM

## 2024-03-22 MED ORDER — TRAMADOL HCL 50 MG PO TABS
50.0000 mg | ORAL_TABLET | Freq: Two times a day (BID) | ORAL | 2 refills | Status: DC | PRN
Start: 2024-03-22 — End: 2024-08-07

## 2024-03-22 NOTE — Progress Notes (Signed)
 Sent mychart

## 2024-03-22 NOTE — Telephone Encounter (Signed)
 Spoke to pt she stated that she has talked to insurance and they stated that she can refill because she is going out of town

## 2024-03-22 NOTE — Telephone Encounter (Signed)
 Pharmacy Patient Advocate Encounter   Received notification from Pt Calls Messages that prior authorization for Tramadol  is required/requested.   Insurance verification completed.      Per test claim: Refill too soon. PA is not needed at this time. Medication was filled 03/08/2024. Next eligible fill date is 03/24/2024. A vacation override is something that the pharmacy would have to get on their end in order to process a refill too soon rejection, if that is something that is allowed by her insurance.

## 2024-03-22 NOTE — Telephone Encounter (Signed)
 Per test claim: Refill too soon. PA is not needed at this time. Medication was filled 03/08/2024. Next eligible fill date is 03/24/2024. A vacation override is something that the pharmacy would have to get on their end in order to process a refill too soon rejection, if that is something that is allowed by her insurance. Thank you.

## 2024-03-23 NOTE — Telephone Encounter (Signed)
Rx sent in to pharmacy pt is aware 

## 2024-04-02 ENCOUNTER — Encounter: Payer: Self-pay | Admitting: Family

## 2024-04-02 ENCOUNTER — Ambulatory Visit: Payer: Managed Care, Other (non HMO) | Admitting: Family

## 2024-04-05 ENCOUNTER — Encounter: Payer: Self-pay | Admitting: Family

## 2024-04-05 ENCOUNTER — Ambulatory Visit: Admitting: Family

## 2024-04-05 VITALS — BP 120/70 | HR 79 | Temp 98.2°F | Ht 62.0 in | Wt 266.2 lb

## 2024-04-05 DIAGNOSIS — G894 Chronic pain syndrome: Secondary | ICD-10-CM | POA: Diagnosis not present

## 2024-04-05 DIAGNOSIS — G43809 Other migraine, not intractable, without status migrainosus: Secondary | ICD-10-CM | POA: Diagnosis not present

## 2024-04-05 MED ORDER — TOPIRAMATE 25 MG PO TABS: 25.0000 mg | ORAL_TABLET | Freq: Every day | ORAL | 2 refills | Status: DC

## 2024-04-05 NOTE — Progress Notes (Signed)
 Assessment & Plan:  Other migraine without status migrainosus, not intractable Assessment & Plan: Chronic, some improvement.  Continues to have breakthrough headaches, presentation of HA is unchanged from previous.  She is  interested in increasing Topamax .  We discussed with increase she may not have a reduction in headaches and may acquire more side effects.  Discussed cognitive dysfunction, memory impairment, dizziness.  She will be hypervigilant with any side effects from increased dose of Topamax .  Increased topamax  125 mg total daily.  Re encouraged her to reconsider follow-up with neurology for follow-up in regards to migraines.  She politely declines at this time however may consider follow-up.  Continue propranolol  40 mg twice daily.   Orders: -     Topiramate ; Take 1 tablet (25 mg total) by mouth daily.  Dispense: 30 tablet; Refill: 2  Chronic pain syndrome Assessment & Plan: Chronic, at baseline. Continue Cymbalta  60 mg twice daily,meloxicam  7.5 mg, tramadol  50mg  bid prn, baclofen  10 mg twice daily as needed.      Return precautions given.   Risks, benefits, and alternatives of the medications and treatment plan prescribed today were discussed, and patient expressed understanding.   Education regarding symptom management and diagnosis given to patient on AVS either electronically or printed.  No follow-ups on file.  Bascom Bossier, FNP  Subjective:    Patient ID: Yvonne [name], female    DOB: 10-16-1966, 58 y.o.   MRN: [id number]  CC: Yvonne [name] is a 58 y.o. female who presents today for follow up.   HPI: Compliant with Topamax  100 mg every day with some improvement in frequency of HA.   HA associated with nausea. HA is unchanged from prior NO aura, vision changes.  HA are triggered by allergies, neck pain, changes in weather ( such as storm).   Nortriptyline  was ineffective   She is no longer following with Dr Walden Guise , neurology.   Compliant with  tramadol , Cymbalta , meloxicam  for chronic back and neck pain.   Allergies: Dried figs [ficus], Flonase  [fluticasone  propionate], Sulfa antibiotics, and Tussionex pennkinetic er [hydrocod poli-chlorphe poli er] Current Outpatient Medications on File Prior to Visit  Medication Sig Dispense Refill   acetaminophen  (TYLENOL ) 650 MG CR tablet Take 650 mg by mouth every 8 (eight) hours as needed for pain.     albuterol  (VENTOLIN  HFA) 108 (90 Base) MCG/ACT inhaler Inhale 1-2 puffs into the lungs as needed. 18 g 0   amLODipine  (NORVASC ) 5 MG tablet Take 1 tablet by mouth once daily 90 tablet 1   baclofen  (LIORESAL ) 10 MG tablet TAKE 1 TABLET BY MOUTH TWICE DAILY AS NEEDED FOR MUSCLE SPASM 60 tablet 2   cycloSPORINE (RESTASIS) 0.05 % ophthalmic emulsion Place 1 drop into both eyes 2 (two) times daily.     DULoxetine  (CYMBALTA ) 60 MG capsule Take 1 capsule (60 mg total) by mouth 2 (two) times daily. 180 capsule 3   fexofenadine  (ALLEGRA  ALLERGY) 180 MG tablet Take 1 tablet (180 mg total) by mouth daily. 90 tablet 1   gabapentin  (NEURONTIN ) 300 MG capsule Take 1 capsule by mouth 4 times daily 120 capsule 0   hydroxychloroquine (PLAQUENIL) 200 MG tablet Take 200 mg by mouth 2 (two) times daily.     latanoprost (XALATAN) 0.005 % ophthalmic solution Place 1 drop into both eyes at bedtime.     Melatonin 10 MG CAPS Take 1 capsule by mouth at bedtime.     meloxicam  (MOBIC ) 7.5 MG tablet TAKE 1 TABLET BY MOUTH  ONCE DAILY AS NEEDED FOR PAIN 90 tablet 1   montelukast  (SINGULAIR ) 10 MG tablet TAKE 1 TABLET BY MOUTH AT BEDTIME 90 tablet 3   omeprazole  (PRILOSEC) 20 MG capsule Take 1 capsule (20 mg total) by mouth daily. 90 capsule 3   ondansetron  (ZOFRAN -ODT) 8 MG disintegrating tablet DISSOLVE 1 TABLET IN MOUTH TWICE DAILY AS NEEDED FOR MIGRAINE HEADACHE FOR NAUSEA AND VOMITING 30 tablet 2   propranolol  (INDERAL ) 40 MG tablet Take 1 tablet by mouth twice daily 60 tablet 0   SUMAtriptan  (IMITREX ) 100 MG tablet Take 50  to 100 mg as a single dose. If symptoms persist or return, may repeat dose (usually same as first dose) after =2 hours. Maximum dose: 100 mg/dose; 200 mg per 24 hours. 90 tablet 1   timolol (TIMOPTIC) 0.25 % ophthalmic solution Place 1 drop into both eyes daily.     topiramate  (TOPAMAX ) 100 MG tablet Take 1 tablet (100 mg total) by mouth daily. 90 tablet 3   traMADol  (ULTRAM ) 50 MG tablet Take 1 tablet (50 mg total) by mouth every 12 (twelve) hours as needed. 60 tablet 2   No current facility-administered medications on file prior to visit.    Review of Systems  Constitutional:  Negative for chills and fever.  Respiratory:  Negative for cough.   Cardiovascular:  Negative for chest pain and palpitations.  Gastrointestinal:  Negative for nausea and vomiting.  Musculoskeletal:  Positive for arthralgias.  Neurological:  Positive for headaches.      Objective:    BP 120/70   Pulse 79   Temp 98.2 F (36.8 C) (Oral)   Ht 5\' 2"  (1.575 m)   Wt 266 lb 3.2 oz (120.7 kg)   LMP 05/06/2017 (Exact Date) Comment: BSO  SpO2 97%   BMI 48.69 kg/m  BP Readings from Last 3 Encounters:  04/05/24 120/70  01/04/24 118/76  11/10/23 118/70   Wt Readings from Last 3 Encounters:  04/05/24 266 lb 3.2 oz (120.7 kg)  01/04/24 268 lb 3.2 oz (121.7 kg)  11/10/23 273 lb (123.8 kg)    Physical Exam Vitals reviewed.  Constitutional:      Appearance: She is well-developed.  Eyes:     Conjunctiva/sclera: Conjunctivae normal.  Cardiovascular:     Rate and Rhythm: Normal rate and regular rhythm.     Pulses: Normal pulses.     Heart sounds: Normal heart sounds.  Pulmonary:     Effort: Pulmonary effort is normal.     Breath sounds: Normal breath sounds. No wheezing, rhonchi or rales.  Skin:    General: Skin is warm and dry.  Neurological:     Mental Status: She is alert.  Psychiatric:        Speech: Speech normal.        Behavior: Behavior normal.        Thought Content: Thought content normal.

## 2024-04-05 NOTE — Assessment & Plan Note (Signed)
 Chronic, some improvement.  Continues to have breakthrough headaches, presentation of HA is unchanged from previous.  She is  interested in increasing Topamax .  We discussed with increase she may not have a reduction in headaches and may acquire more side effects.  Discussed cognitive dysfunction, memory impairment, dizziness.  She will be hypervigilant with any side effects from increased dose of Topamax .  Increased topamax  125 mg total daily.  Re encouraged her to reconsider follow-up with neurology for follow-up in regards to migraines.  She politely declines at this time however may consider follow-up.  Continue propranolol  40 mg twice daily.

## 2024-04-05 NOTE — Assessment & Plan Note (Signed)
 Chronic, at baseline. Continue Cymbalta  60 mg twice daily,meloxicam  7.5 mg, tramadol  50mg  bid prn, baclofen  10 mg twice daily as needed.

## 2024-04-12 ENCOUNTER — Other Ambulatory Visit: Payer: Self-pay | Admitting: Family

## 2024-04-14 ENCOUNTER — Other Ambulatory Visit: Payer: Self-pay | Admitting: Family

## 2024-04-20 ENCOUNTER — Other Ambulatory Visit: Payer: Self-pay | Admitting: Family

## 2024-04-20 DIAGNOSIS — G2581 Restless legs syndrome: Secondary | ICD-10-CM

## 2024-04-23 ENCOUNTER — Encounter: Payer: Self-pay | Admitting: Family

## 2024-04-23 DIAGNOSIS — G2581 Restless legs syndrome: Secondary | ICD-10-CM

## 2024-04-24 MED ORDER — GABAPENTIN 300 MG PO CAPS: 300.0000 mg | ORAL_CAPSULE | Freq: Four times a day (QID) | ORAL | 0 refills | Status: DC

## 2024-05-02 ENCOUNTER — Encounter: Payer: Self-pay | Admitting: Family

## 2024-05-02 NOTE — Telephone Encounter (Signed)
 Noted

## 2024-05-04 ENCOUNTER — Other Ambulatory Visit: Payer: Self-pay | Admitting: Family

## 2024-05-04 DIAGNOSIS — J3089 Other allergic rhinitis: Secondary | ICD-10-CM

## 2024-05-14 ENCOUNTER — Other Ambulatory Visit: Payer: Self-pay | Admitting: Family

## 2024-05-14 DIAGNOSIS — G43809 Other migraine, not intractable, without status migrainosus: Secondary | ICD-10-CM

## 2024-05-14 DIAGNOSIS — G2581 Restless legs syndrome: Secondary | ICD-10-CM

## 2024-06-14 ENCOUNTER — Other Ambulatory Visit: Payer: Self-pay | Admitting: Family

## 2024-06-14 DIAGNOSIS — G2581 Restless legs syndrome: Secondary | ICD-10-CM

## 2024-06-15 ENCOUNTER — Other Ambulatory Visit: Payer: Self-pay | Admitting: Family

## 2024-07-12 ENCOUNTER — Telehealth (INDEPENDENT_AMBULATORY_CARE_PROVIDER_SITE_OTHER): Admitting: Family

## 2024-07-12 ENCOUNTER — Encounter: Payer: Self-pay | Admitting: Family

## 2024-07-12 VITALS — BP 112/92 | HR 78

## 2024-07-12 DIAGNOSIS — J40 Bronchitis, not specified as acute or chronic: Secondary | ICD-10-CM

## 2024-07-12 DIAGNOSIS — G894 Chronic pain syndrome: Secondary | ICD-10-CM | POA: Diagnosis not present

## 2024-07-12 DIAGNOSIS — I10 Essential (primary) hypertension: Secondary | ICD-10-CM

## 2024-07-12 DIAGNOSIS — G43809 Other migraine, not intractable, without status migrainosus: Secondary | ICD-10-CM | POA: Diagnosis not present

## 2024-07-12 MED ORDER — PREDNISONE 10 MG PO TABS: ORAL_TABLET | ORAL | 0 refills | Status: DC

## 2024-07-12 MED ORDER — ALBUTEROL SULFATE HFA 108 (90 BASE) MCG/ACT IN AERS
1.0000 | INHALATION_SPRAY | RESPIRATORY_TRACT | 0 refills | Status: AC | PRN
Start: 2024-07-12 — End: ?

## 2024-07-12 NOTE — Patient Instructions (Addendum)
 Consider prednisone  taper which I have sent in. Please take doses prior to noon each day  Let me know how you are doing   [email or web address]  It is imperative that you are seen AT least twice per year for labs and monitoring. Monitor blood pressure at home and me 5-6 reading on separate days. Goal is less than 120/80, based on newest guidelines, however we certainly want to be less than 130/80;  if persistently higher, please make sooner follow up appointment so we can recheck you blood pressure and manage/ adjust medications.

## 2024-07-12 NOTE — Progress Notes (Signed)
 Virtual Visit via Video Note  I connected with Yvonne [name] on 07/15/24 at  3:00 PM EDT by a video enabled telemedicine application and verified that I am speaking with the correct person using two identifiers. Location patient: home Location provider: work  Persons participating in the virtual visit: patient, provider  I discussed the limitations of evaluation and management by telemedicine and the availability of in person appointments. The patient expressed understanding and agreed to proceed.  HPI: Discussed the use of AI scribe software for clinical note transcription with the patient, who gave verbal consent to proceed.  History of Present Illness   Yvonne [name] is a 58 year old female with chronic headaches who presents for follow-up on headache management.  She has ongoing issues with headaches and recalls a previous increase in her Topamax  dosage from 100 mg to 125 mg, but experienced side effects without increased effectiveness. She prefers taking topamax  four 25 mg tablets instead of a single higher dose, finding it more effective.  In 2021, she had an MRI of her cervical spine and received an injection, which was helpful for a period. She has a history of migraines and has previously seen Dr. Lazarus for her condition. Financial concerns regarding treatment costs are present, and she is exploring financial assistance options.  She experiences neck pain, which she believes contributes to her headaches. She uses heat and ice therapy, as well as massage, to manage symptoms. She has taken baclofen  and tramadol  for pain management.  Her current medications include Topamax , baclofen , and tramadol . She has also used gabapentin  in the past. She mentions a blood pressure reading with a diastolic number of 92, which is higher than usual, possibly due to pain.   Has a history of glaucoma; she cannot take daily steroid such as flonase  but she can take short oral duration which she has done  so in the past.      Compliant with  Cymbalta  60 mg twice daily,meloxicam  7.5 mg, tramadol  50mg  bid prn, baclofen  10 mg twice daily as needed.   Compliant with  propranolol  40 mg twice daily.   MR cervical spine  04/2020 IMPRESSION: 1. 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 2. Severe right neural foraminal stenosis at C3-4. 3. Moderate right and severe left neural foraminal stenosis at C5-6. 4. Moderate to severe left neural foraminal stenosis at C6-7. ROS: See pertinent positives and negatives per HPI.  Previously saw Dr Marcelino 03/23/2021  S/p ESI with relief 02/23/21  Borderline glaucoma ; she can take prednisone  6 day taper; she cannot take daily flonase  .   EXAM:  VITALS per patient if applicable: BP (!) 112/92   Pulse 78   LMP 05/06/2017 (Exact Date) Comment: BSO  SpO2 98%  BP Readings from Last 3 Encounters:  07/12/24 (!) 112/92  04/05/24 120/70  01/04/24 118/76   Wt Readings from Last 3 Encounters:  04/05/24 266 lb 3.2 oz (120.7 kg)  01/04/24 268 lb 3.2 oz (121.7 kg)  11/10/23 273 lb (123.8 kg)    GENERAL: alert, oriented, appears well and in no acute distress  HEENT: atraumatic, conjunttiva clear, no obvious abnormalities on inspection of external nose and ears  NECK: normal movements of the head and neck  LUNGS: on inspection no signs of respiratory distress, breathing rate appears normal, no obvious gross SOB, gasping or wheezing  CV: no obvious cyanosis  MS: moves all visible extremities without noticeable abnormality  PSYCH/NEURO: pleasant  and cooperative, no obvious depression or anxiety, speech and thought processing grossly intact  ASSESSMENT AND PLAN: Other migraine without status migrainosus, not intractable Assessment & Plan: Chronic migraine, cervicogenic headache   She experiences chronic migraine and cervicogenic headache with ongoing symptoms. Continue topamax  100mg , propranolol  40mg   BID.  Prednisone  is considered for interrupting the migraine cycle. Financial assistance and insurance options were discussed as she cannot return to pain management for ESI ( previously effective) at this time. Recommend heat/ice therapy, massage, and shower for symptom relief.  Orders: -     predniSONE ; Take 40 mg by mouth on day 1, then taper 10 mg daily until gone  Dispense: 10 tablet; Refill: 0 -     Topiramate ; Take 4 tablets (100 mg total) by mouth daily.  Bronchitis -     Albuterol  Sulfate HFA; Inhale 1-2 puffs into the lungs as needed.  Dispense: 18 g; Refill: 0  Hypertension, unspecified type Assessment & Plan: Chronic, overall stable. DBP reported elevated. Advised patient to monitor at home and let me know if persistent. Continue amlodipine  5mg  qd     Chronic pain syndrome Assessment & Plan: Chronic, uncontrolled.  Continue Cymbalta  60 mg twice daily,meloxicam  7.5 mg, tramadol  50mg  bid prn, baclofen  10 mg twice daily as needed. Prednisone  taper provided if needed. Financial resources provided. Will continue to talk about returning to Pain Management.       -we discussed possible serious and likely etiologies, options for evaluation and workup, limitations of telemedicine visit vs in person visit, treatment, treatment risks and precautions. Pt prefers to treat via telemedicine empirically rather then risking or undertaking an in person visit at this moment.    I discussed the assessment and treatment plan with the patient. The patient was provided an opportunity to ask questions and all were answered. The patient agreed with the plan and demonstrated an understanding of the instructions.   The patient was advised to call back or seek an in-person evaluation if the symptoms worsen or if the condition fails to improve as anticipated.  Advised if desired AVS can be mailed or viewed via MyChart if Mychart user.   [name], FNP

## 2024-07-15 MED ORDER — TOPIRAMATE 25 MG PO TABS: 100.0000 mg | ORAL_TABLET | Freq: Every day | ORAL | Status: AC

## 2024-07-15 NOTE — Assessment & Plan Note (Signed)
 Chronic, overall stable. DBP reported elevated. Advised patient to monitor at home and let me know if persistent. Continue amlodipine  5mg  qd

## 2024-07-15 NOTE — Assessment & Plan Note (Addendum)
 Chronic migraine, cervicogenic headache   She experiences chronic migraine and cervicogenic headache with ongoing symptoms. Continue topamax  100mg , propranolol  40mg  BID.  Prednisone  is considered for interrupting the migraine cycle. Financial assistance and insurance options were discussed as she cannot return to pain management for ESI ( previously effective) at this time. Recommend heat/ice therapy, massage, and shower for symptom relief.

## 2024-07-15 NOTE — Assessment & Plan Note (Signed)
 Chronic, uncontrolled.  Continue Cymbalta  60 mg twice daily,meloxicam  7.5 mg, tramadol  50mg  bid prn, baclofen  10 mg twice daily as needed. Prednisone  taper provided if needed. Financial resources provided. Will continue to talk about returning to Pain Management.

## 2024-07-17 ENCOUNTER — Other Ambulatory Visit: Payer: Self-pay | Admitting: Family

## 2024-07-18 ENCOUNTER — Other Ambulatory Visit: Payer: Self-pay | Admitting: Family

## 2024-07-19 ENCOUNTER — Other Ambulatory Visit: Payer: Self-pay | Admitting: Family

## 2024-07-19 DIAGNOSIS — M2550 Pain in unspecified joint: Secondary | ICD-10-CM

## 2024-07-24 ENCOUNTER — Other Ambulatory Visit: Payer: Self-pay | Admitting: Family

## 2024-07-24 DIAGNOSIS — M2550 Pain in unspecified joint: Secondary | ICD-10-CM

## 2024-07-30 ENCOUNTER — Other Ambulatory Visit: Payer: Self-pay | Admitting: Family

## 2024-07-30 DIAGNOSIS — J3089 Other allergic rhinitis: Secondary | ICD-10-CM

## 2024-08-03 ENCOUNTER — Other Ambulatory Visit: Payer: Self-pay | Admitting: Family

## 2024-08-03 DIAGNOSIS — G894 Chronic pain syndrome: Secondary | ICD-10-CM

## 2024-08-12 ENCOUNTER — Other Ambulatory Visit: Payer: Self-pay | Admitting: Family

## 2024-08-12 DIAGNOSIS — G2581 Restless legs syndrome: Secondary | ICD-10-CM

## 2024-08-13 ENCOUNTER — Other Ambulatory Visit: Payer: Self-pay | Admitting: Family

## 2024-08-13 DIAGNOSIS — M62838 Other muscle spasm: Secondary | ICD-10-CM

## 2024-08-28 ENCOUNTER — Other Ambulatory Visit: Payer: Self-pay | Admitting: Family

## 2024-08-28 DIAGNOSIS — I10 Essential (primary) hypertension: Secondary | ICD-10-CM

## 2024-09-13 ENCOUNTER — Other Ambulatory Visit: Payer: Self-pay | Admitting: Family

## 2024-09-13 DIAGNOSIS — G2581 Restless legs syndrome: Secondary | ICD-10-CM

## 2024-09-14 ENCOUNTER — Other Ambulatory Visit: Payer: Self-pay | Admitting: Family

## 2024-09-23 ENCOUNTER — Other Ambulatory Visit: Payer: Self-pay | Admitting: Family

## 2024-09-25 ENCOUNTER — Other Ambulatory Visit: Payer: Self-pay | Admitting: Family

## 2024-09-25 DIAGNOSIS — G894 Chronic pain syndrome: Secondary | ICD-10-CM

## 2024-10-02 ENCOUNTER — Other Ambulatory Visit: Payer: Self-pay | Admitting: Family

## 2024-10-14 ENCOUNTER — Other Ambulatory Visit: Payer: Self-pay | Admitting: Family

## 2024-10-14 DIAGNOSIS — G2581 Restless legs syndrome: Secondary | ICD-10-CM

## 2024-10-16 ENCOUNTER — Other Ambulatory Visit: Payer: Self-pay | Admitting: Family

## 2024-10-16 DIAGNOSIS — M2550 Pain in unspecified joint: Secondary | ICD-10-CM

## 2024-10-16 DIAGNOSIS — G43809 Other migraine, not intractable, without status migrainosus: Secondary | ICD-10-CM

## 2024-10-18 ENCOUNTER — Other Ambulatory Visit: Payer: Self-pay | Admitting: Family

## 2024-10-18 ENCOUNTER — Encounter: Payer: Self-pay | Admitting: Family

## 2024-10-18 DIAGNOSIS — K219 Gastro-esophageal reflux disease without esophagitis: Secondary | ICD-10-CM

## 2024-10-18 DIAGNOSIS — G43809 Other migraine, not intractable, without status migrainosus: Secondary | ICD-10-CM

## 2024-10-18 MED ORDER — SUMATRIPTAN SUCCINATE 100 MG PO TABS: ORAL_TABLET | ORAL | 1 refills | Status: AC

## 2024-10-18 MED ORDER — OMEPRAZOLE 20 MG PO CPDR: 20.0000 mg | DELAYED_RELEASE_CAPSULE | Freq: Every day | ORAL | 3 refills | Status: DC

## 2024-10-19 ENCOUNTER — Other Ambulatory Visit: Payer: Self-pay | Admitting: Family

## 2024-10-30 ENCOUNTER — Other Ambulatory Visit: Payer: Self-pay | Admitting: Family

## 2024-10-30 DIAGNOSIS — J3089 Other allergic rhinitis: Secondary | ICD-10-CM

## 2024-11-05 ENCOUNTER — Other Ambulatory Visit: Payer: Self-pay | Admitting: Family

## 2024-11-05 ENCOUNTER — Telehealth: Payer: Self-pay

## 2024-11-05 DIAGNOSIS — G894 Chronic pain syndrome: Secondary | ICD-10-CM

## 2024-11-05 NOTE — Telephone Encounter (Signed)
 Called pt to get her scheduled an appt with PCP as her last office visit was in May. As I was in the middle of leaving a VM the line disconnected  E2C2 PLEASE HAVE PT SCHEDULED AN APPT WITH PCP

## 2024-11-09 ENCOUNTER — Other Ambulatory Visit: Payer: Self-pay | Admitting: Family

## 2024-11-09 DIAGNOSIS — M62838 Other muscle spasm: Secondary | ICD-10-CM

## 2024-11-17 ENCOUNTER — Other Ambulatory Visit: Payer: Self-pay | Admitting: Family

## 2024-11-17 DIAGNOSIS — G2581 Restless legs syndrome: Secondary | ICD-10-CM

## 2024-11-21 ENCOUNTER — Other Ambulatory Visit: Payer: Self-pay | Admitting: Family

## 2024-11-21 DIAGNOSIS — I10 Essential (primary) hypertension: Secondary | ICD-10-CM

## 2024-12-06 ENCOUNTER — Other Ambulatory Visit: Payer: Self-pay | Admitting: Family

## 2024-12-06 DIAGNOSIS — G894 Chronic pain syndrome: Secondary | ICD-10-CM

## 2024-12-07 ENCOUNTER — Other Ambulatory Visit: Payer: Self-pay | Admitting: Family

## 2024-12-07 DIAGNOSIS — J3089 Other allergic rhinitis: Secondary | ICD-10-CM

## 2024-12-10 ENCOUNTER — Telehealth: Payer: Self-pay

## 2024-12-10 DIAGNOSIS — K219 Gastro-esophageal reflux disease without esophagitis: Secondary | ICD-10-CM

## 2024-12-10 DIAGNOSIS — I10 Essential (primary) hypertension: Secondary | ICD-10-CM

## 2024-12-10 DIAGNOSIS — G894 Chronic pain syndrome: Secondary | ICD-10-CM

## 2024-12-10 DIAGNOSIS — J3089 Other allergic rhinitis: Secondary | ICD-10-CM

## 2024-12-10 MED ORDER — MONTELUKAST SODIUM 10 MG PO TABS: 10.0000 mg | ORAL_TABLET | Freq: Every day | ORAL | 3 refills | Status: AC

## 2024-12-10 MED ORDER — OMEPRAZOLE 20 MG PO CPDR: 20.0000 mg | DELAYED_RELEASE_CAPSULE | Freq: Every day | ORAL | 3 refills | Status: AC

## 2024-12-10 MED ORDER — PROPRANOLOL HCL 40 MG PO TABS: 40.0000 mg | ORAL_TABLET | Freq: Two times a day (BID) | ORAL | 1 refills | Status: AC

## 2024-12-10 MED ORDER — AMLODIPINE BESYLATE 5 MG PO TABS: 5.0000 mg | ORAL_TABLET | Freq: Every day | ORAL | 0 refills | Status: AC

## 2024-12-10 MED ORDER — DULOXETINE HCL 60 MG PO CPEP: 60.0000 mg | ORAL_CAPSULE | Freq: Two times a day (BID) | ORAL | 0 refills | Status: AC

## 2024-12-10 NOTE — Telephone Encounter (Signed)
 I left voicemail for patient asking her to please call us  to reschedule her 12/11/2024 appointment, as [name], FNP, will be out of the office.  I also sent a message to patient via MyChart.  E2C2 - when patient calls back, please assist her with rescheduling her appointment.

## 2024-12-10 NOTE — Telephone Encounter (Signed)
 Copied from CRM [id number]. Topic: Clinical - Medication Question >> Dec 10, 2024  3:06 PM Winona R wrote: Pt has resch her appointment cancelled by provider but will need refills on all of her medications at the end of the month.

## 2024-12-10 NOTE — Addendum Note (Signed)
 Addended by: Jonella Redditt on: 12/10/2024 04:23 PM   Modules accepted: Orders

## 2024-12-11 ENCOUNTER — Ambulatory Visit: Admitting: Family

## 2024-12-12 NOTE — Telephone Encounter (Signed)
 Please tell patient which medications were refilled 12/11/23  Tramadol  is not due until 01/07/2025. There is also a refill on this medication  I do not see any other medication that are due for refill at this time

## 2024-12-12 NOTE — Telephone Encounter (Signed)
 Spoke to pt informed her of refills sent in and the 1 refill left on the Tramadol  aware cannot refill until 01/07/25 pt verbalized understanding

## 2024-12-14 ENCOUNTER — Other Ambulatory Visit: Payer: Self-pay | Admitting: Family

## 2024-12-14 DIAGNOSIS — G2581 Restless legs syndrome: Secondary | ICD-10-CM

## 2024-12-20 ENCOUNTER — Ambulatory Visit: Admitting: Family

## 2024-12-20 ENCOUNTER — Encounter: Payer: Self-pay | Admitting: Family

## 2024-12-20 VITALS — BP 112/78 | HR 55 | Temp 98.5°F | Ht 62.0 in | Wt 261.2 lb

## 2024-12-20 DIAGNOSIS — F419 Anxiety disorder, unspecified: Secondary | ICD-10-CM | POA: Diagnosis not present

## 2024-12-20 DIAGNOSIS — F32A Depression, unspecified: Secondary | ICD-10-CM | POA: Diagnosis not present

## 2024-12-20 DIAGNOSIS — I10 Essential (primary) hypertension: Secondary | ICD-10-CM | POA: Diagnosis not present

## 2024-12-20 DIAGNOSIS — Z1231 Encounter for screening mammogram for malignant neoplasm of breast: Secondary | ICD-10-CM | POA: Diagnosis not present

## 2024-12-20 DIAGNOSIS — Z008 Encounter for other general examination: Secondary | ICD-10-CM | POA: Diagnosis not present

## 2024-12-20 DIAGNOSIS — Z0000 Encounter for general adult medical examination without abnormal findings: Secondary | ICD-10-CM

## 2024-12-20 MED ORDER — HYDROXYZINE HCL 10 MG PO TABS: 10.0000 mg | ORAL_TABLET | Freq: Two times a day (BID) | ORAL | 1 refills | Status: AC | PRN

## 2024-12-20 NOTE — Progress Notes (Signed)
 "  Assessment & Plan:  Anxiety Assessment & Plan:  Increased anxiety and depressive symptoms are linked to workplace stress. She is on the maximum dose of Cymbalta , and propranolol  use is limited by low blood pressure, heart rate. She is adamant that she has no suicidal ideation. Counseling and FMLA options were discussed. Hydroxyzine  is considered for anxiety management. Continue Cymbalta  60 mg twice daily and propranolol  40mg  BID.   Prescribe hydroxyzine  10 mg, 1-2 tablets by mouth twice daily as needed for anxiety. Encourage counseling through work as her preference. Discussed FMLA options for leave and provide details for paperwork. She will let me know if she would like to proceed with FMLA paperwork. Advised on safe firearm storage. Close follow up.   Orders: -     hydrOXYzine  HCl; Take 1-2 tablets (10-20 mg total) by mouth 2 (two) times daily as needed for anxiety.  Dispense: 90 tablet; Refill: 1  Encounter for other general examination -     Lipid panel; Future  Encounter for biometric screening -     Lipid panel; Future  Encounter for screening and preventative care -     Lipid panel; Future  Hypertension, unspecified type Assessment & Plan: Chronic, stable.Continue amlodipine  5mg  qd    Orders: -     Comprehensive metabolic panel with GFR; Future  Encounter for screening mammogram for malignant neoplasm of breast -     3D Screening Mammogram, Left and Right; Future  Anxiety and depression Assessment & Plan:  Increased anxiety and depressive symptoms are linked to workplace stress. She is on the maximum dose of Cymbalta , and propranolol  use is limited by low blood pressure, heart rate. She is adamant that she has no suicidal ideation. Counseling and FMLA options were discussed. Hydroxyzine  is considered for anxiety management. Continue Cymbalta  60 mg twice daily and propranolol  40mg  BID.   Prescribe hydroxyzine  10 mg, 1-2 tablets by mouth twice daily as needed for anxiety.  Encourage counseling through work as her preference. Discussed FMLA options for leave and provide details for paperwork. She will let me know if she would like to proceed with FMLA paperwork. Advised on safe firearm storage. Close follow up.       Return precautions given.   Risks, benefits, and alternatives of the medications and treatment plan prescribed today were discussed, and patient expressed understanding.   Education regarding symptom management and diagnosis given to patient on AVS either electronically or printed.  Return in about 1 month (around 01/20/2025) for Fasting labs in 2-3 weeks.  [name], FNP  Subjective:    Patient ID: Yvonne [name], female    DOB: [birth year], 59 y.o.   MRN: [id number]  CC: Yvonne [name] is a 59 y.o. female who presents today for follow up.   HPI: HPI Discussed the use of AI scribe software for clinical note transcription with the patient, who gave verbal consent to proceed.  History of Present Illness   Yvonne [name] is a 59 year old female with depression and anxiety who presents with worsening depressive symptoms and workplace stress.  Over the past two to three weeks, she has experienced worsening depressive symptoms, characterized by thoughts of questioning her value and impact, particularly in the workplace. She denies suicidal ideation or suicidal plan. No h/o suicide attempt. There is significant distress about her perceived lack of value at work.  She describes a high level of anxiety, manifesting as shaking and excessive worry, particularly concerning her son who has epilepsy. Hearing  an ambulance triggers anxiety about her son's well-being, despite knowing he is not in immediate danger.  She is currently taking Cymbalta  60 mg twice daily, which she has historically found effective, but notes that her symptoms have recently worsened. She also takes propranolol  for migraines.  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  She experiences anxiety attacks, particularly when using her CPAP machine at night, which causes her to feel trapped and panicked.   She has a history of migraines and takes propranolol  for management. She also reports experiencing hives, which she associates with anxiety, noting that they appear quickly and resolve without intervention.  Her social history includes a supportive marriage and a strong faith, which she finds helpful in managing her stress. She recently attended a Christian conference with her husband, which she found beneficial.  Denies suicidal plans. Firearms in the home are stored safely.     Compliant with cymbalta  60mg  BID   Compliant with topamax  100mg , propranolol  40mg  BID.  Compliant with amlodipine  5mg    Denies dizziness, CP            Allergies: Dried figs [ficus], Flonase  [fluticasone  propionate], Sulfa antibiotics, and Tussionex pennkinetic er [hydrocod poli-chlorphe poli er] Medications Ordered Prior to Encounter[1]  Review of Systems  Constitutional:  Negative for chills and fever.  Respiratory:  Negative for cough.   Cardiovascular:  Negative for chest pain and palpitations.  Gastrointestinal:  Negative for nausea and vomiting.      Objective:    BP 112/78   Pulse (!) 55   Temp 98.5 F (36.9 C) (Oral)   Ht 5' 2 (1.575 m)   Wt 261 lb 3.2 oz (118.5 kg)   LMP 05/06/2017 Comment: BSO  SpO2 97%   BMI 47.77 kg/m  BP Readings from Last 3 Encounters:  12/20/24 112/78  07/12/24 (!) 112/92  04/05/24 120/70   Wt Readings from Last 3 Encounters:  12/20/24 261 lb 3.2 oz (118.5 kg)  04/05/24 266 lb 3.2 oz (120.7 kg)  01/04/24 268 lb 3.2 oz (121.7 kg)      12/20/2024    8:23 AM 07/12/2024    2:57 PM 07/12/2024    2:42 PM  Depression screen PHQ 2/9  Decreased Interest 3 0 0  Down, Depressed, Hopeless 0  0 0  PHQ - 2 Score 3 0 0  Altered sleeping 0    Tired, decreased energy 0    Change in appetite 0    Feeling bad or failure about yourself  3    Trouble concentrating 3    Moving slowly or fidgety/restless 0    Suicidal thoughts 1    PHQ-9 Score 10    Difficult doing work/chores Not difficult at all        12/20/2024    8:24 AM 08/08/2023   11:38 AM 03/18/2023    3:56 PM 03/03/2022    3:47 PM  GAD 7 : Generalized Anxiety Score  Nervous, Anxious, on Edge 3 1  1   0   Control/stop worrying 3 3  3  1    Worry too much - different things 3 3  3  1    Trouble relaxing 3 3  2   0   Restless 3 0  2  3   Easily annoyed or irritable 3 3  3  2    Afraid - awful might  happen 3 0  1  0   Total GAD 7 Score 21 13 15 7   Anxiety Difficulty Somewhat difficult Not difficult at all Very difficult Somewhat difficult     Data saved with a previous flowsheet row definition     Physical Exam Vitals reviewed.  Constitutional:      Appearance: She is well-developed.  Eyes:     Conjunctiva/sclera: Conjunctivae normal.  Cardiovascular:     Rate and Rhythm: Normal rate and regular rhythm.     Pulses: Normal pulses.     Heart sounds: Normal heart sounds.  Pulmonary:     Effort: Pulmonary effort is normal.     Breath sounds: Normal breath sounds. No wheezing, rhonchi or rales.  Skin:    General: Skin is warm and dry.  Neurological:     Mental Status: She is alert.  Psychiatric:        Speech: Speech normal.        Behavior: Behavior normal.        Thought Content: Thought content normal.           [1]  Current Outpatient Medications on File Prior to Visit  Medication Sig Dispense Refill   acetaminophen  (TYLENOL ) 650 MG CR tablet Take 650 mg by mouth every 8 (eight) hours as needed for pain.     albuterol  (VENTOLIN  HFA) 108 (90 Base) MCG/ACT inhaler Inhale 1-2 puffs into the lungs as needed. 18 g 0   amLODipine  (NORVASC ) 5 MG tablet Take 1 tablet (5 mg total) by mouth daily. 90 tablet 0    baclofen  (LIORESAL ) 10 MG tablet TAKE 1 TABLET BY MOUTH TWICE DAILY AS NEEDED FOR MUSCLE SPASM 60 tablet 0   cycloSPORINE (RESTASIS) 0.05 % ophthalmic emulsion Place 1 drop into both eyes 2 (two) times daily.     DULoxetine  (CYMBALTA ) 60 MG capsule Take 1 capsule (60 mg total) by mouth 2 (two) times daily. 180 capsule 0   fexofenadine  (ALLEGRA ) 180 MG tablet TAKE 1 TABLET BY MOUTH ONCE DAILY. STOP XYZAL . 80 tablet 0   gabapentin  (NEURONTIN ) 300 MG capsule Take 1 capsule by mouth 4 times daily 120 capsule 5   hydroxychloroquine (PLAQUENIL) 200 MG tablet Take 200 mg by mouth 2 (two) times daily.     latanoprost (XALATAN) 0.005 % ophthalmic solution Place 1 drop into both eyes at bedtime.     Melatonin 10 MG CAPS Take 1 capsule by mouth at bedtime.     meloxicam  (MOBIC ) 7.5 MG tablet TAKE 1 TABLET BY MOUTH ONCE DAILY AS NEEDED FOR PAIN 90 tablet 0   montelukast  (SINGULAIR ) 10 MG tablet Take 1 tablet (10 mg total) by mouth at bedtime. 90 tablet 3   omeprazole  (PRILOSEC) 20 MG capsule Take 1 capsule (20 mg total) by mouth daily. 90 capsule 3   ondansetron  (ZOFRAN -ODT) 8 MG disintegrating tablet DISSOLVE 1 TABLET IN MOUTH TWICE DAILY AS NEEDED FOR MIGRAINE HEADACHE FOR NAUSEA AND VOMITING 30 tablet 2   propranolol  (INDERAL ) 40 MG tablet Take 1 tablet (40 mg total) by mouth 2 (two) times daily. 60 tablet 1   SUMAtriptan  (IMITREX ) 100 MG tablet Take 50 to 100 mg as a single dose. If symptoms persist or return, may repeat dose (usually same as first dose) after =2 hours. Maximum dose: 100 mg/dose; 200 mg per 24 hours. 30 tablet 1   timolol (TIMOPTIC) 0.25 % ophthalmic solution Place 1 drop into both eyes daily.     topiramate  (TOPAMAX ) 25 MG tablet Take  4 tablets (100 mg total) by mouth daily.     traMADol  (ULTRAM ) 50 MG tablet TAKE 1 TABLET BY MOUTH TWICE DAILY. 60 tablet 2   No current facility-administered medications on file prior to visit.   "

## 2024-12-20 NOTE — Patient Instructions (Addendum)
 Trial of atarax  for anxiety  Monitor for sedation and dry mouth as this medication is an ANTIHISTAMINE ( like benadryl)   Okay is let [name] out to counselor through and let me if not satisfied or there is a delay and I will place a counseling referral. Please let me know how you are doing as we have options for leave of work and more intensive out patient counseling.   Please call  and schedule your 3D mammogram and /or bone density scan as we discussed.   [facility]  ( new location in 2023)  [address], [place], [place] [address]  [place], [place]  [phone]

## 2024-12-20 NOTE — Assessment & Plan Note (Signed)
" °  Increased anxiety and depressive symptoms are linked to workplace stress. She is on the maximum dose of Cymbalta , and propranolol  use is limited by low blood pressure, heart rate. She is adamant that she has no suicidal ideation. Counseling and FMLA options were discussed. Hydroxyzine  is considered for anxiety management. Continue Cymbalta  60 mg twice daily and propranolol  40mg  BID.   Prescribe hydroxyzine  10 mg, 1-2 tablets by mouth twice daily as needed for anxiety. Encourage counseling through work as her preference. Discussed FMLA options for leave and provide details for paperwork. She will let me know if she would like to proceed with FMLA paperwork. Advised on safe firearm storage. Close follow up.  "

## 2024-12-20 NOTE — Assessment & Plan Note (Signed)
Chronic, stable.  Continue amlodipine 5 mg qd 

## 2025-01-01 ENCOUNTER — Ambulatory Visit: Admitting: Family

## 2025-01-04 ENCOUNTER — Encounter: Payer: Self-pay | Admitting: Family

## 2025-01-04 NOTE — Telephone Encounter (Signed)
"  Noted in appt notes.  "

## 2025-01-10 ENCOUNTER — Other Ambulatory Visit

## 2025-01-11 ENCOUNTER — Encounter

## 2025-01-21 ENCOUNTER — Ambulatory Visit: Admitting: Family
